# Patient Record
Sex: Male | Born: 1945
Health system: Southern US, Community
[De-identification: ages and names within clinical notes are randomized; demographics above are authoritative.]

## PROBLEM LIST (undated history)

## (undated) DIAGNOSIS — C180 Malignant neoplasm of cecum: Secondary | ICD-10-CM

## (undated) DIAGNOSIS — I10 Essential (primary) hypertension: Secondary | ICD-10-CM

## (undated) DIAGNOSIS — C19 Malignant neoplasm of rectosigmoid junction: Secondary | ICD-10-CM

## (undated) DIAGNOSIS — I213 ST elevation (STEMI) myocardial infarction of unspecified site: Secondary | ICD-10-CM

## (undated) DIAGNOSIS — K259 Gastric ulcer, unspecified as acute or chronic, without hemorrhage or perforation: Secondary | ICD-10-CM

## (undated) DIAGNOSIS — E8881 Metabolic syndrome: Secondary | ICD-10-CM

## (undated) DIAGNOSIS — E876 Hypokalemia: Secondary | ICD-10-CM

## (undated) DIAGNOSIS — E785 Hyperlipidemia, unspecified: Secondary | ICD-10-CM

## (undated) DIAGNOSIS — C169 Malignant neoplasm of stomach, unspecified: Secondary | ICD-10-CM

## (undated) DIAGNOSIS — Z800 Family history of malignant neoplasm of digestive organs: Secondary | ICD-10-CM

## (undated) DIAGNOSIS — R918 Other nonspecific abnormal finding of lung field: Secondary | ICD-10-CM

## (undated) DIAGNOSIS — I2510 Atherosclerotic heart disease of native coronary artery without angina pectoris: Secondary | ICD-10-CM

## (undated) DIAGNOSIS — K219 Gastro-esophageal reflux disease without esophagitis: Secondary | ICD-10-CM

## (undated) DIAGNOSIS — Z720 Tobacco use: Secondary | ICD-10-CM

## (undated) DIAGNOSIS — Z9289 Personal history of other medical treatment: Secondary | ICD-10-CM

## (undated) HISTORY — DX: Malignant neoplasm of rectosigmoid junction: C19

## (undated) HISTORY — DX: Family history of malignant neoplasm of digestive organs: Z80.0

## (undated) HISTORY — PX: APPENDECTOMY: SHX54

## (undated) HISTORY — PX: HERNIA REPAIR: SHX51

## (undated) HISTORY — DX: Malignant neoplasm of stomach, unspecified: C16.9

## (undated) HISTORY — PX: CATARACT EXTRACTION, BILATERAL: SHX1313

## (undated) HISTORY — DX: Malignant neoplasm of cecum: C18.0

---

## 2009-01-18 ENCOUNTER — Other Ambulatory Visit: Admission: RE | Admit: 2009-01-18 | Discharge: 2009-01-18 | Payer: Self-pay | Admitting: Internal Medicine

## 2013-01-19 ENCOUNTER — Encounter (HOSPITAL_COMMUNITY)
Admission: AD | Disposition: A | Discharge status: Home / Self Care | Payer: Self-pay | Source: Other Acute Inpatient Hospital | Attending: Cardiovascular Disease

## 2013-01-19 ENCOUNTER — Encounter (HOSPITAL_COMMUNITY): Payer: Self-pay | Admitting: Internal Medicine

## 2013-01-19 ENCOUNTER — Inpatient Hospital Stay (HOSPITAL_COMMUNITY)
Admission: AD | Admit: 2013-01-19 | Discharge: 2013-01-21 | DRG: 282 | Disposition: A | Discharge status: Home / Self Care | Payer: Medicare Other | Source: Other Acute Inpatient Hospital | Attending: Cardiovascular Disease | Admitting: Cardiovascular Disease

## 2013-01-19 DIAGNOSIS — I2510 Atherosclerotic heart disease of native coronary artery without angina pectoris: Secondary | ICD-10-CM

## 2013-01-19 DIAGNOSIS — I214 Non-ST elevation (NSTEMI) myocardial infarction: Secondary | ICD-10-CM

## 2013-01-19 DIAGNOSIS — I498 Other specified cardiac arrhythmias: Secondary | ICD-10-CM | POA: Diagnosis present

## 2013-01-19 DIAGNOSIS — Z23 Encounter for immunization: Secondary | ICD-10-CM

## 2013-01-19 DIAGNOSIS — E8881 Metabolic syndrome: Secondary | ICD-10-CM | POA: Diagnosis present

## 2013-01-19 DIAGNOSIS — E876 Hypokalemia: Secondary | ICD-10-CM | POA: Diagnosis present

## 2013-01-19 DIAGNOSIS — J449 Chronic obstructive pulmonary disease, unspecified: Secondary | ICD-10-CM | POA: Diagnosis present

## 2013-01-19 DIAGNOSIS — Z7982 Long term (current) use of aspirin: Secondary | ICD-10-CM

## 2013-01-19 DIAGNOSIS — I213 ST elevation (STEMI) myocardial infarction of unspecified site: Secondary | ICD-10-CM

## 2013-01-19 DIAGNOSIS — Z9089 Acquired absence of other organs: Secondary | ICD-10-CM

## 2013-01-19 DIAGNOSIS — Z7902 Long term (current) use of antithrombotics/antiplatelets: Secondary | ICD-10-CM

## 2013-01-19 DIAGNOSIS — Z720 Tobacco use: Secondary | ICD-10-CM

## 2013-01-19 DIAGNOSIS — K219 Gastro-esophageal reflux disease without esophagitis: Secondary | ICD-10-CM | POA: Diagnosis present

## 2013-01-19 DIAGNOSIS — E785 Hyperlipidemia, unspecified: Secondary | ICD-10-CM

## 2013-01-19 DIAGNOSIS — F17200 Nicotine dependence, unspecified, uncomplicated: Secondary | ICD-10-CM | POA: Diagnosis present

## 2013-01-19 DIAGNOSIS — Z79899 Other long term (current) drug therapy: Secondary | ICD-10-CM

## 2013-01-19 DIAGNOSIS — J4489 Other specified chronic obstructive pulmonary disease: Secondary | ICD-10-CM | POA: Diagnosis present

## 2013-01-19 DIAGNOSIS — R918 Other nonspecific abnormal finding of lung field: Secondary | ICD-10-CM

## 2013-01-19 HISTORY — DX: ST elevation (STEMI) myocardial infarction of unspecified site: I21.3

## 2013-01-19 HISTORY — DX: Other nonspecific abnormal finding of lung field: R91.8

## 2013-01-19 HISTORY — DX: Hypokalemia: E87.6

## 2013-01-19 HISTORY — DX: Hyperlipidemia, unspecified: E78.5

## 2013-01-19 HISTORY — DX: Atherosclerotic heart disease of native coronary artery without angina pectoris: I25.10

## 2013-01-19 HISTORY — DX: Tobacco use: Z72.0

## 2013-01-19 HISTORY — DX: Gastro-esophageal reflux disease without esophagitis: K21.9

## 2013-01-19 HISTORY — DX: Metabolic syndrome: E88.81

## 2013-01-19 HISTORY — PX: LEFT HEART CATH: SHX5478

## 2013-01-19 LAB — CREATININE, SERUM
Creatinine, Ser: 0.95 mg/dL (ref 0.50–1.35)
GFR calc Af Amer: >90 mL/min (ref >90)
GFR calc non Af Amer: 83 mL/min — ABNORMAL LOW (ref >90)

## 2013-01-19 LAB — LIPID PANEL
LDL Cholesterol: 132 mg/dL — ABNORMAL HIGH (ref 0–99)
Total CHOL/HDL Ratio: 5.7 RATIO
Triglycerides: 133 mg/dL (ref <150)
VLDL: 27 mg/dL (ref 0–40)

## 2013-01-19 LAB — COMPREHENSIVE METABOLIC PANEL
ALT: 9 U/L (ref 0–53)
AST: 28 U/L (ref 0–37)
Albumin: 3.1 g/dL — ABNORMAL LOW (ref 3.5–5.2)
Alkaline Phosphatase: 112 U/L (ref 39–117)
Calcium: 8.8 mg/dL (ref 8.4–10.5)
GFR calc Af Amer: >90 mL/min (ref >90)
Potassium: 3.8 mEq/L (ref 3.5–5.1)
Sodium: 140 mEq/L (ref 135–145)
Total Protein: 6.3 g/dL (ref 6.0–8.3)

## 2013-01-19 LAB — CBC
Hemoglobin: 13.8 g/dL (ref 13.0–17.0)
MCH: 32.3 pg (ref 26.0–34.0)
RBC: 4.27 MIL/uL (ref 4.22–5.81)
WBC: 10.7 10*3/uL — ABNORMAL HIGH (ref 4.0–10.5)

## 2013-01-19 LAB — TSH: TSH: 0.952 u[IU]/mL (ref 0.350–4.500)

## 2013-01-19 LAB — MRSA PCR SCREENING: MRSA by PCR: NEGATIVE

## 2013-01-19 LAB — TROPONIN I
Troponin I: 6.74 ng/mL (ref <0.30)
Troponin I: 3.15 ng/mL (ref <0.30)

## 2013-01-19 SURGERY — LEFT HEART CATH
Anesthesia: LOCAL

## 2013-01-19 MED ORDER — IPRATROPIUM BROMIDE 0.02 % IN SOLN: 0.5000 mg | RESPIRATORY_TRACT | Status: DC | PRN

## 2013-01-19 MED ORDER — ONDANSETRON HCL 4 MG/2ML IJ SOLN
4.0000 mg | Freq: Four times a day (QID) | INTRAMUSCULAR | Status: DC | PRN
  Filled 2013-01-19: qty 2

## 2013-01-19 MED ORDER — LACTATED RINGERS IV SOLN: INTRAVENOUS | Status: DC

## 2013-01-19 MED ORDER — ATORVASTATIN CALCIUM 80 MG PO TABS
80.0000 mg | ORAL_TABLET | Freq: Every day | ORAL | Status: DC
  Administered 2013-01-19: 80 mg via ORAL
  Filled 2013-01-19 (×3): qty 1

## 2013-01-19 MED ORDER — AMLODIPINE BESYLATE 2.5 MG PO TABS
2.5000 mg | ORAL_TABLET | Freq: Every day | ORAL | Status: DC
  Administered 2013-01-19 – 2013-01-21 (×3): 2.5 mg via ORAL
  Filled 2013-01-19 (×3): qty 1

## 2013-01-19 MED ORDER — IPRATROPIUM BROMIDE 0.02 % IN SOLN
0.5000 mg | Freq: Four times a day (QID) | RESPIRATORY_TRACT | Status: DC
  Administered 2013-01-19 – 2013-01-20 (×5): 0.5 mg via RESPIRATORY_TRACT
  Filled 2013-01-19 (×5): qty 2.5

## 2013-01-19 MED ORDER — NICOTINE 21 MG/24HR TD PT24
21.0000 mg | MEDICATED_PATCH | Freq: Every day | TRANSDERMAL | Status: DC
  Administered 2013-01-19 – 2013-01-21 (×3): 21 mg via TRANSDERMAL
  Filled 2013-01-19 (×3): qty 1

## 2013-01-19 MED ORDER — ONDANSETRON HCL 4 MG/2ML IJ SOLN: 4.0000 mg | Freq: Four times a day (QID) | INTRAMUSCULAR | Status: DC | PRN

## 2013-01-19 MED ORDER — INFLUENZA VAC SPLIT QUAD 0.5 ML IM SUSP: 0.5000 mL | INTRAMUSCULAR | Status: DC

## 2013-01-19 MED ORDER — INFLUENZA VAC SPLIT QUAD 0.5 ML IM SUSP: 0.5000 mL | INTRAMUSCULAR | Status: DC | PRN

## 2013-01-19 MED ORDER — ASPIRIN EC 81 MG PO TBEC: 81.0000 mg | DELAYED_RELEASE_TABLET | Freq: Every day | ORAL | Status: DC

## 2013-01-19 MED ORDER — LISINOPRIL 5 MG PO TABS
5.0000 mg | ORAL_TABLET | Freq: Every day | ORAL | Status: DC
  Administered 2013-01-19 – 2013-01-21 (×3): 5 mg via ORAL
  Filled 2013-01-19 (×3): qty 1

## 2013-01-19 MED ORDER — CLOPIDOGREL BISULFATE 300 MG PO TABS
600.0000 mg | ORAL_TABLET | Freq: Once | ORAL | Status: AC
  Administered 2013-01-19: 600 mg via ORAL
  Filled 2013-01-19: qty 2

## 2013-01-19 MED ORDER — ACETAMINOPHEN 325 MG PO TABS
650.0000 mg | ORAL_TABLET | ORAL | Status: DC | PRN
Start: 2013-01-19 — End: 2013-01-21
  Administered 2013-01-21: 650 mg via ORAL
  Filled 2013-01-19: qty 2

## 2013-01-19 MED ORDER — MIDAZOLAM HCL 2 MG/2ML IJ SOLN
INTRAMUSCULAR | Status: AC
  Filled 2013-01-19: qty 2

## 2013-01-19 MED ORDER — MORPHINE SULFATE 2 MG/ML IJ SOLN
INTRAMUSCULAR | Status: AC
  Filled 2013-01-19: qty 1

## 2013-01-19 MED ORDER — ATORVASTATIN CALCIUM 80 MG PO TABS: 80.0000 mg | ORAL_TABLET | Freq: Every day | ORAL | Status: DC

## 2013-01-19 MED ORDER — PNEUMOCOCCAL VAC POLYVALENT 25 MCG/0.5ML IJ INJ: 0.5000 mL | INJECTION | INTRAMUSCULAR | Status: DC

## 2013-01-19 MED ORDER — HEPARIN (PORCINE) IN NACL 2-0.9 UNIT/ML-% IJ SOLN
INTRAMUSCULAR | Status: AC
  Filled 2013-01-19: qty 1000

## 2013-01-19 MED ORDER — CLOPIDOGREL BISULFATE 75 MG PO TABS
75.0000 mg | ORAL_TABLET | Freq: Every day | ORAL | Status: DC
  Administered 2013-01-20 – 2013-01-21 (×2): 75 mg via ORAL
  Filled 2013-01-19 (×3): qty 1

## 2013-01-19 MED ORDER — ALBUTEROL SULFATE (5 MG/ML) 0.5% IN NEBU
2.5000 mg | INHALATION_SOLUTION | Freq: Four times a day (QID) | RESPIRATORY_TRACT | Status: DC
  Administered 2013-01-19 – 2013-01-20 (×5): 2.5 mg via RESPIRATORY_TRACT
  Filled 2013-01-19 (×5): qty 0.5

## 2013-01-19 MED ORDER — PNEUMOCOCCAL VAC POLYVALENT 25 MCG/0.5ML IJ INJ: 0.5000 mL | INJECTION | INTRAMUSCULAR | Status: DC | PRN

## 2013-01-19 MED ORDER — ACETAMINOPHEN 325 MG PO TABS: 650.0000 mg | ORAL_TABLET | ORAL | Status: DC | PRN

## 2013-01-19 MED ORDER — NITROGLYCERIN 0.2 MG/ML ON CALL CATH LAB
INTRAVENOUS | Status: AC
  Filled 2013-01-19: qty 1

## 2013-01-19 MED ORDER — ALBUTEROL SULFATE (5 MG/ML) 0.5% IN NEBU: 2.5000 mg | INHALATION_SOLUTION | RESPIRATORY_TRACT | Status: DC | PRN

## 2013-01-19 MED ORDER — ENOXAPARIN SODIUM 40 MG/0.4ML ~~LOC~~ SOLN
40.0000 mg | SUBCUTANEOUS | Status: DC
  Administered 2013-01-19 – 2013-01-20 (×2): 40 mg via SUBCUTANEOUS
  Filled 2013-01-19 (×3): qty 0.4

## 2013-01-19 MED ORDER — BIOTENE DRY MOUTH MT LIQD
15.0000 mL | Freq: Two times a day (BID) | OROMUCOSAL | Status: DC
  Administered 2013-01-19 – 2013-01-21 (×5): 15 mL via OROMUCOSAL

## 2013-01-19 MED ORDER — FENTANYL CITRATE 0.05 MG/ML IJ SOLN
INTRAMUSCULAR | Status: AC
  Filled 2013-01-19: qty 2

## 2013-01-19 MED ORDER — SODIUM CHLORIDE 0.9 % IV SOLN
INTRAVENOUS | Status: DC
  Administered 2013-01-19 (×2): via INTRAVENOUS

## 2013-01-19 MED ORDER — ASPIRIN EC 81 MG PO TBEC
81.0000 mg | DELAYED_RELEASE_TABLET | Freq: Every day | ORAL | Status: DC
  Administered 2013-01-19 – 2013-01-21 (×3): 81 mg via ORAL
  Filled 2013-01-19 (×3): qty 1

## 2013-01-19 MED ORDER — MORPHINE SULFATE 2 MG/ML IJ SOLN
2.0000 mg | INTRAMUSCULAR | Status: DC | PRN
  Administered 2013-01-19: 2 mg via INTRAVENOUS

## 2013-01-19 MED ORDER — PANTOPRAZOLE SODIUM 40 MG PO TBEC
40.0000 mg | DELAYED_RELEASE_TABLET | Freq: Every day | ORAL | Status: DC
  Administered 2013-01-19 – 2013-01-21 (×3): 40 mg via ORAL
  Filled 2013-01-19 (×3): qty 1

## 2013-01-19 MED ORDER — LIDOCAINE HCL (PF) 1 % IJ SOLN
INTRAMUSCULAR | Status: AC
  Filled 2013-01-19: qty 30

## 2013-01-19 MED ORDER — NITROGLYCERIN 0.4 MG SL SUBL: 0.4000 mg | SUBLINGUAL_TABLET | SUBLINGUAL | Status: DC | PRN

## 2013-01-19 MED ORDER — CLOPIDOGREL BISULFATE 75 MG PO TABS: 75.0000 mg | ORAL_TABLET | Freq: Every day | ORAL | Status: DC

## 2013-01-19 MED ORDER — ZOLPIDEM TARTRATE 5 MG PO TABS: 5.0000 mg | ORAL_TABLET | Freq: Every evening | ORAL | Status: DC | PRN

## 2013-01-19 NOTE — Progress Notes (Addendum)
Subjective: No complaints  Objective: Vital signs in last 24 hours: Temp:  [97.3 F (36.3 C)-97.4 F (36.3 C)] 97.3 F (36.3 C) (11/23 0754) Pulse Rate:  [48-65] 65 (11/23 0930) Resp:  [8-16] 16 (11/23 0930) BP: (119-182)/(59-98) 144/98 mmHg (11/23 0930) SpO2:  [99 %-100 %] 100 % (11/23 0930) Weight:  [160 lb 3.2 oz (72.666 kg)] 160 lb 3.2 oz (72.666 kg) (11/23 0600) Weight change:  Last BM Date: 01/17/13 Intake/Output from previous day: +125 11/22 0701 - 11/23 0700 In: 179.2 [I.V.:179.2] Out: -  Intake/Output this shift: Total I/O In: 125 [I.V.:125] Out: -   PE: General:Pleasant affect, NAD Skin:Warm and dry, brisk capillary refill HEENT:normocephalic, sclera clear, mucus membranes moist Neck:supple, no JVD, no bruits  Heart:S1S2 RRR without murmur, gallup, rub or click Lungs:clear without rales, rhonchi, or wheezes UEA:VWUJ, non tender, + BS, do not palpate liver spleen or masses Ext:no lower ext edema, 2+ pedal pulses, 2+ radial pulses Neuro:alert and oriented, MAE, follows commands, + facial symmetry   Lab Results:  Recent Labs  01/19/13 0820  WBC 10.7*  HGB 13.8  HCT 40.1  PLT 182   BMET  Recent Labs  01/19/13 0820  CREATININE 0.95    Recent Labs  01/19/13 0820  TROPONINI 6.44*    Lab Results  Component Value Date   CHOL 193 01/19/2013   HDL 34* 01/19/2013   LDLCALC 132* 01/19/2013   TRIG 133 01/19/2013   CHOLHDL 5.7 01/19/2013   No results found for this basename: HGBA1C     No results found for this basename: TSH     No results found for this basename: PROTIME,  in the last 72 hours     Studies/Results: No results found.  Medications: I have reviewed the patient's current medications. Scheduled Meds: . ipratropium  0.5 mg Nebulization Q6H   And  . albuterol  2.5 mg Nebulization Q6H  . antiseptic oral rinse  15 mL Mouth Rinse BID  . aspirin EC  81 mg Oral Daily  . atorvastatin  80 mg Oral q1800  . [START ON  01/20/2013] clopidogrel  75 mg Oral Q breakfast  . enoxaparin (LOVENOX) injection  40 mg Subcutaneous Q24H  . [START ON 01/20/2013] influenza vac split quadrivalent PF  0.5 mL Intramuscular Tomorrow-1000  . nicotine  21 mg Transdermal Daily  . pantoprazole  40 mg Oral Q0600  . [START ON 01/20/2013] pneumococcal 23 valent vaccine  0.5 mL Intramuscular Tomorrow-1000   Continuous Infusions: . sodium chloride 125 mL/hr at 01/19/13 0800   PRN Meds:.acetaminophen, albuterol, ipratropium, nitroGLYCERIN, ondansetron (ZOFRAN) IV, zolpidem  Assessment/Plan: Principal Problem:   Non-STEMI (non-ST elevated myocardial infarction), involving RCA Active Problems:   CAD (coronary artery disease) RCA non dominant vessel, LAD 40%, 80-90% 2nd diag EF 55%    Hyperlipidemia LDL goal < 70   Pulmonary nodules   PLAN: now pain free, on Lipitor now for hyperlipidemia, add lisinopril 5 and norvasc. No BB due to bradycardia.  Will plan for pul consult tomorrow for lung nodules.  LOS: 0 days   Time spent with pt. :15 minutes. [facility] R  Nurse Practitioner Certified Pager [phone] 01/19/2013, 10:40 AM   Agree with note written by Nada Boozer RNP  Admitted with NSTEMI. Trop 6. Emergent cath early this AM by Dr. Wonda Cheng showed occluded ND RCA with otherwise non critical CAD and preserved LVEF. Currently painfree. Exam benign. Groin OK. OOB to chair. Bradycardic so won't start BB. Will  start ACE-I and CCB for HTN. Statin. Smoking cessation. Tx to tele tomorrow, prob home Tuesday. He has pulm nodules on CXR. Worry about Lung CA in a smoker. I have not told pt this yet. Will get Pulm consult. Will prob require a Chest CT.   Runell Gess 01/19/2013 10:42 AM

## 2013-01-19 NOTE — Plan of Care (Signed)
Problem: Phase I Progression Outcomes Goal: MD aware of Cardiac Marker results Outcome: Completed/Met Date Met:  01/19/13 Dr Allyson Sabal called critical troponin this AM

## 2013-01-19 NOTE — Progress Notes (Signed)
Dr Allyson Sabal updated at bedside. Updated sbp 120-150, pt SB with HR 45-60. MD assessed EKG from outside facility. Assessed Right groin site, will increase mobility once bedrest up post cath. Updated lab results thus far this shift. Troponin ordered q6hour. MD updated pt and visitor at bedside. Will continue to monitor. Koren Bound

## 2013-01-19 NOTE — Plan of Care (Signed)
Problem: Consults Goal: Diabetes Guidelines if Diabetic/Glucose > 140 If diabetic or lab glucose is > 140 mg/dl - Initiate Diabetes/Hyperglycemia Guidelines & Document Interventions  Outcome: Completed/Met Date Met:  01/19/13 Glucose on lab 128

## 2013-01-19 NOTE — Progress Notes (Signed)
Chaplain requested to meet pt's family in cath lab waiting area. Chaplain met with pt's girlfriend of 5 years, who said she took pt to ED in [place], [place] shortly after dinner when he was having chest pains. She said she had been awake all night. Pt has one stepson who was not present. She described pt as "hard working, always busy, doesn't like to sit down and rest." Chaplain acted as Print production planner between family member and cath lab medical team, escorted family member to 2S waiting area, and provided reflective listening, caring presence, and emotional support.   Guy Sandifer [place], [place] [phone]: General Chaplain Pager [phone]: Personal Pager

## 2013-01-19 NOTE — H&P (Addendum)
Chief Complaint: Chest pain  HPI: This is 67 year old Caucasian male who was transferred from [facility] emergency department with concerns for STEMI. According to the patient he started having chest pain on the left side of his chest as if he pulled a muscle about 6 PM, this pain was coming and going and patient continued to have mild chest pain on presentation to our Cath Lab which she reported to be 1/10. Patient for the chest pain was associated with shortness of breath nausea.  Patient denied recent symptoms of PND orthopnea, lower extremity edema, frequent or prolonged palpitations.  Pt arrived directly to the cath lab and underwent LHC, coronary angiography, LV gram as a NSTEMI  Review of Systems: 12 systems were reviewed and were negative except mentioned in history of present illness   Past Medical History: Past Medical History  Diagnosis Date  . GERD (gastroesophageal reflux disease)    Past Surgical History  Procedure Laterality Date  . Appendectomy      Medications: Ranitidine 150 mg  twice a day  Allergies:  Allergies no known allergies  Social History: Active smoker, MOD and one pack per day for many years Denied drug use  Family History: Denied any family history of cardiac or disease  PHYSICAL EXAM: Vital Signs (in the cath lab): HR 65 bpm, BP 115/72 mm Hg; SpO2=96% on 2 L [place]  General:  NAD, alert and oriented x 3 HEENT: normal, tobacco stain on the beard Neck: supple. no JVD. Carotids 2+ bilat; No lymphadenopathy or thryomegaly appreciated. Cor: PMI nondisplaced. Regular rate & rhythm. No rubs, gallops or murmurs, 2+ bilateral femoral and TP/DP pulses Lungs: bilateral wheezings Abdomen: soft, nontender, nondistended. No hepatosplenomegaly. No bruits or masses. Good bowel sounds. Extremities: no cyanosis, clubbing, rash, edema Neuro: alert & oriented x 3, cranial nerves grossly intact. moves all 4 extremities w/o difficulty. Affect pleasant.  OSH Labs:   Troponin - 1.65; CK-MB-15.6 Chemistry review was notable for Cr of 1.08, potassium of 3.2 INR -1.0 WBC-10.9 Hb-14.4 PLT -198  EKG personally reviewed and interpreted by me: Sinus Bradycardia 57 bpm, normal axis, Q waves in I, aVL; 2 mm TWI in V3-V6, I, aVL   Assessment/Plan CAD with non-ST elevation MI Patient is multivessel CAD with his left heart catheterization including: occluded right coronary artery with left to right collaterals, 50-60% proximal LAD, 50% obtuse marginal branches. His overall LVEF looked normal with basal and mid anterolateral hypokinesis. - Aspirin 81 mg, Plavix 75 mg daily, atorvastatin 80 mg daily - We'll plan on starting beta blockers once wheezings resolve  Tobacco abuse / COPD (clinical diagnosis) Nicotine patch Duo-nebs q6 hours and as needed  Hypokalemia Will supplement potassium by mouth  Taylinn Brabant 01/19/2013, 5:13 AM

## 2013-01-19 NOTE — CV Procedure (Signed)
Steven Drake is a 67 y.o. male    [id number]  [id number] LOCATION:  FACILITY: [facility]  PHYSICIAN: Lennette Bihari, MD, [facility] [birth year]   DATE OF PROCEDURE:  01/19/2013      EMERGENT CARDIAC CATHETERIZATION     HISTORY:  Mr. Dontavian Marchi is a 67 year old Caucasian male who was transferred acutely from [facility] with possible ST segment elevation myocardial infarction. The patient had noticed episodes of chest pain since approximately 6 PM. His initial troponin was negative but subsequent troponin was positive. He was noted to have anterolateral ST changes. He was transported acutely to [facility] for emergent cardiac catheterization. Upon arrival, review of ECG suggests more acute coronary syndrome rather than ST segment elevation myocardial infarction with T-wave inversion I and avLL, V4 through V6.   PROCEDURE:  The patient arrived to [facility] cardiac catheterization laboratory with 1/10 residual chest discomfort. His right femoral artery is punctured anteriorly and a 6 French sheath was inserted without difficulty. Versed 2 mg plus fentanyl 50 mcg were administered. Cardiac catheterization was performed utilizing 6 French diagnostic F. L4 catheter 6 French right guide catheter. A 6 French pigtail catheter was used for left ventriculography. Angiograms were reviewed. It was felt that the patient did not require acute percutaneous coronary intervention a rather initial medical therapy is recommended. ACT was documented and the sheath was pulled in the catheterization suite prior to transfer to the unit.  HEMODYNAMICS:   Central Aorta: 110/50   Left Ventricle: 110/12  ANGIOGRAPHY:  1. Left main: Angiographically normal vessel which bifurcated into a large LAD and dominant left circumflex coronary artery.  2. LAD: A smooth 40% proximal narrowing. The first and second diagonal branches were diminutive in size. The second diagonal branch had diffuse 80-90% stenosis in its  midsegment but again was a very small vessel. It gave rise to 2 additional diagonal vessels and several septal perforating arteries. The vessel extended and wrapped around the apex. There was collateralization of the distal right coronary artery from the left injection. 3. Left circumflex: Dominant vessel which gave rise to 2 marginal branches and ended in the PDA and PLA-like vessel. There was 70% ostial narrowing in the first marginal branch and diffuse 50% narrowing in the second marginal branch. 4. Right coronary artery: Small nondominant vessel with 95% proximal stenosis. The mid RCA was 99 100% occluded. There was bridging collaterals supplying the RCA distally and they were more extensive left to right collaterals supplying the distal right coronary artery.  5. Biplane cine left ventriculography revealed an ejection fraction of approximately 50-55%.. On the RAO projection, there was focal contractility of the mid anterolateral wall. In the LAO projection there is very minimal inferolateral hypocontractility.  IMPRESSION:  Low normal LV function with moderately severe focal hypocontractility of the mid anterolateral wall and minimal hypocontractility of the focal nferolateral wall with an ejection fraction of approximately 50-55%.  Multi-vessel coronary obstructive disease with 40% smooth ostial narrowing of the LAD, 80-90% stenosis in the diminutive second diagonal branch of the LAD; dominant left circumflex carotid artery with 70% ostial narrowing in the first marginal branch and diffuse 50% narrowing in the second marginal branch; and nondominant right carotid artery with 95% proximal stenosis, subtotal/total mid RCA occlusion with moderate right to right bridging collaterals and extensive left to right collaterals.  RECOMMENDATION:  Initial medical therapy will be recommended. The RCA has both antegrade as well as retrograde collaterals and is a nondominant vessel. Smoking cessation is  imperative.  Lennette Bihari, MD, [facility] 01/19/2013 6:01 AM

## 2013-01-19 NOTE — Progress Notes (Addendum)
Dr Allyson Sabal updated pt right femoral site WNL, pt OOB to chair and ambulated in hall ~389ft. Pt with good mobility, when back to room, pt reports pain similar to chest pain from 11/22, pt rates it at a 3/10, in comparison to pain on 11/22 was a 10/10. MD updated HR 60-70s, BP 120-150s. MD updated pt had ordered EKG this AM, results showed SB with ST/T wave abnormality with prolonged QT. Orders received for PRN morphine. Will continue to monitor. Koren Bound

## 2013-01-19 NOTE — Progress Notes (Signed)
Dr Allyson Sabal updated on critical troponin level, 6.44, collected at 0820. MD updated last result found from outside facility [facility]) ~0200 of troponin 1.65. Will continue to monitor. Koren Bound

## 2013-01-20 ENCOUNTER — Encounter (HOSPITAL_COMMUNITY): Payer: Self-pay | Admitting: Cardiology

## 2013-01-20 DIAGNOSIS — I219 Acute myocardial infarction, unspecified: Secondary | ICD-10-CM

## 2013-01-20 DIAGNOSIS — E876 Hypokalemia: Secondary | ICD-10-CM

## 2013-01-20 DIAGNOSIS — R918 Other nonspecific abnormal finding of lung field: Secondary | ICD-10-CM

## 2013-01-20 DIAGNOSIS — E8881 Metabolic syndrome: Secondary | ICD-10-CM

## 2013-01-20 DIAGNOSIS — Z720 Tobacco use: Secondary | ICD-10-CM

## 2013-01-20 HISTORY — DX: Hypokalemia: E87.6

## 2013-01-20 HISTORY — DX: Metabolic syndrome: E88.810

## 2013-01-20 HISTORY — DX: Tobacco use: Z72.0

## 2013-01-20 HISTORY — DX: Metabolic syndrome: E88.81

## 2013-01-20 LAB — CBC
HCT: 36.5 % — ABNORMAL LOW (ref 39.0–52.0)
Hemoglobin: 12.5 g/dL — ABNORMAL LOW (ref 13.0–17.0)
MCHC: 34.2 g/dL (ref 30.0–36.0)
MCV: 93.4 fL (ref 78.0–100.0)
Platelets: 169 10*3/uL (ref 150–400)
RDW: 13.4 % (ref 11.5–15.5)

## 2013-01-20 LAB — BASIC METABOLIC PANEL
BUN: 12 mg/dL (ref 6–23)
Creatinine, Ser: 0.88 mg/dL (ref 0.50–1.35)
GFR calc Af Amer: >90 mL/min (ref >90)
GFR calc non Af Amer: 86 mL/min — ABNORMAL LOW (ref >90)
Glucose, Bld: 117 mg/dL — ABNORMAL HIGH (ref 70–99)
Potassium: 3.3 mEq/L — ABNORMAL LOW (ref 3.5–5.1)

## 2013-01-20 LAB — TROPONIN I
Troponin I: 3.33 ng/mL (ref <0.30)
Troponin I: 2.78 ng/mL (ref <0.30)

## 2013-01-20 LAB — GLUCOSE, CAPILLARY: Glucose-Capillary: 97 mg/dL (ref 70–99)

## 2013-01-20 MED ORDER — IPRATROPIUM BROMIDE 0.02 % IN SOLN: 0.5000 mg | Freq: Four times a day (QID) | RESPIRATORY_TRACT | Status: DC | PRN

## 2013-01-20 MED ORDER — ALBUTEROL SULFATE (5 MG/ML) 0.5% IN NEBU: 2.5000 mg | INHALATION_SOLUTION | Freq: Four times a day (QID) | RESPIRATORY_TRACT | Status: DC | PRN

## 2013-01-20 MED ORDER — POTASSIUM CHLORIDE CRYS ER 20 MEQ PO TBCR
40.0000 meq | EXTENDED_RELEASE_TABLET | Freq: Once | ORAL | Status: AC
  Administered 2013-01-20: 40 meq via ORAL
  Filled 2013-01-20: qty 2

## 2013-01-20 MED ORDER — ALPRAZOLAM 0.25 MG PO TABS: 0.2500 mg | ORAL_TABLET | Freq: Three times a day (TID) | ORAL | Status: DC | PRN

## 2013-01-20 NOTE — Progress Notes (Signed)
Utilization Review Completed.  

## 2013-01-20 NOTE — Progress Notes (Signed)
Subjective: Up in chair, + chest pain with walking in hall last pm but not today  Objective: Vital signs in last 24 hours: Temp:  [97.5 F (36.4 C)-98.5 F (36.9 C)] 98.5 F (36.9 C) (11/24 0752) Pulse Rate:  [50-68] 68 (11/24 1000) Resp:  [10-21] 11 (11/24 1000) BP: (96-165)/(50-78) 96/51 mmHg (11/24 1000) SpO2:  [94 %-100 %] 96 % (11/24 1000) Weight:  [165 lb 5.5 oz (75 kg)] 165 lb 5.5 oz (75 kg) (11/24 0500) Weight change: 5 lb 2.3 oz (2.334 kg) Last BM Date: 01/17/13 Intake/Output from previous day:  +1624 11/23 0701 - 11/24 0700 In: 3070 [P.O.:1120; I.V.:1950] Out: 1500 [Urine:1500] Intake/Output this shift: Total I/O In: 0  Out: 325 [Urine:325]  PE: General:Pleasant affect, NAD Skin:Warm and dry, brisk capillary refill HEENT:normocephalic, sclera clear, mucus membranes moist Heart:S1S2 RRR without murmur, gallup, rub or click Lungs:clear without rales, rhonchi, or wheezes QIO:NGEX, non tender, + BS, do not palpate liver spleen or masses Ext:no lower ext edema, 2+ pedal pulses, 2+ radial pulses Neuro:alert and oriented, MAE, follows commands, + facial symmetry   EKG:  Sr at 62 with evolving ant lat MI changes.   Lab Results:  Recent Labs  01/19/13 0820 01/20/13 0140  WBC 10.7* 8.9  HGB 13.8 12.5*  HCT 40.1 36.5*  PLT 182 169   BMET  Recent Labs  01/19/13 1300 01/20/13 0140  NA 140 135  K 3.8 3.3*  CL 104 101  CO2 28 26  GLUCOSE 128* 117*  BUN 14 12  CREATININE 0.91 0.88  CALCIUM 8.8 8.5    Recent Labs  01/20/13 0140 01/20/13 0825  TROPONINI 3.33* 2.78*    Lab Results  Component Value Date   CHOL 193 01/19/2013   HDL 34* 01/19/2013   LDLCALC 132* 01/19/2013   TRIG 133 01/19/2013   CHOLHDL 5.7 01/19/2013   Lab Results  Component Value Date   HGBA1C 6.2* 01/19/2013     Lab Results  Component Value Date   TSH 0.952 01/19/2013    Hepatic Function Panel  Recent Labs  01/19/13 1300  PROT 6.3  ALBUMIN 3.1*  AST  28  ALT 9  ALKPHOS 112  BILITOT 0.2*    Recent Labs  01/19/13 0820  CHOL 193   No results found for this basename: PROTIME,  in the last 72 hours      Studies/Results: No results found.  Medications: I have reviewed the patient's current medications. Scheduled Meds: . amLODipine  2.5 mg Oral Daily  . antiseptic oral rinse  15 mL Mouth Rinse BID  . aspirin EC  81 mg Oral Daily  . atorvastatin  80 mg Oral q1800  . clopidogrel  75 mg Oral Q breakfast  . enoxaparin (LOVENOX) injection  40 mg Subcutaneous Q24H  . lisinopril  5 mg Oral Daily  . nicotine  21 mg Transdermal Daily  . pantoprazole  40 mg Oral Q0600   Continuous Infusions: . sodium chloride Stopped (01/20/13 0500)   PRN Meds:.acetaminophen, albuterol, ALPRAZolam, influenza vac split quadrivalent PF, ipratropium, morphine injection, nitroGLYCERIN, ondansetron (ZOFRAN) IV, pneumococcal 23 valent vaccine, zolpidem  Assessment/Plan: Principal Problem:   Non-STEMI (non-ST elevated myocardial infarction), involving RCA Active Problems:   CAD (coronary artery disease) RCA non dominant vessel, LAD 40%, 80-90% 2nd diag EF 55%    Hyperlipidemia LDL goal < 70   Pulmonary nodules   Hypokalemia   Tobacco abuse   Metabolic syndrome, with mildly elevated HgBA1C  PLAN: would transfer to tele, but with chest pain last pm day of MI, today being Day 1 may need stepdown for 24 hours.  He had no intervention.  Will check echo also- EF at cath was 50-55%.  Pk troponin 6.74 is decreasing.   HGB A1C elevated, will change diet and check accu checks.  K+ replaced.  Not as much bradycardia, but BP soft at times with lisinopril and amlodipine. Have contacted Pul. To eval pul nodules for plan.  CT chest was done 01/18/13 in [facility].  LOS: 1 day   Time spent with pt. : 15 minutes. [facility] R  Nurse Practitioner Certified Pager [phone] 01/20/2013, 10:55 AM   I have seen and examined the patient along with Nada Boozer, NP.  I have reviewed the chart, notes and new data.  I agree with NP's note.  Key new complaints: no angina at all today Key examination changes: no arrhythmia or signs of CHF Key new findings / data: reviewed echo while being performed at bedside. EF is normal overall. Small apicolateral hypokinetic area.  PLAN: Transfer telemetry. Smoking cessation discussed in detail. Statin. ASA indefinitely + clopidogrel 12 months. ACE inh would likely be beneficial, but his BP may not tolerate it. Possible DC home tomorrow.  Thurmon Fair, MD, [facility] [phone] 01/20/2013, 3:31 PM

## 2013-01-20 NOTE — Consult Note (Signed)
PULMONARY  / CRITICAL CARE MEDICINE  Name: Steven Drake MRN: [id number] DOB: 1945/11/09    ADMISSION DATE:  01/19/2013 CONSULTATION DATE:  01/20/13  REFERRING MD :  Allyson Sabal  PRIMARY SERVICE:  Cardiology   CHIEF COMPLAINT:  Pulm nodules   BRIEF PATIENT DESCRIPTION: 67 yo male with no known PMH, extensive smoking hx, limited medical care presented 11/23 to [facility] hospital with chest pain.  Tx to [facility] as ?STEMI.  To cath lab for ACS.  No PCI indicated.  Bilat pulm nodules noted on CT chest as [place] as incidental finding and PCCM consulted.   SIGNIFICANT EVENTS / STUDIES:  10/22 CT chest ([place]) >>> bilat pulm nodules = 7.67mm nodule L apex, 6.24mm nodule RML, emphysema   LINES / TUBES: none  CULTURES: none  ANTIBIOTICS: none  HISTORY OF PRESENT ILLNESS: 66 yo male with extensive smoking hx, limited medical care presented to [facility] hospital with chest pain.  Tx to [facility] as ?STEMI.  To cath lab for ACS.  No PCI indicated.  Bilat pulm nodules noted on CT chest as [place] as incidental finding.  Denies current chest pain.  75 pack year smoking hx. Denies any cough, SOB at baseline.  Has had ~10 lbs weight loss over last few months.    PAST MEDICAL HISTORY :  Past Medical History  Diagnosis Date  . GERD (gastroesophageal reflux disease)   . STEMI (ST elevation myocardial infarction), 01/19/13 01/19/2013  . CAD (coronary artery disease) RCA non dominant vessel, LAD 40%, 80-90% 2nd diag EF 55%  01/19/2013  . Hyperlipidemia LDL goal < 70 01/19/2013  . Pulmonary nodules 01/19/2013  . Hypokalemia 01/20/2013  . Tobacco abuse 01/20/2013  . Metabolic syndrome, with mildly elevated HgBA1C 01/20/2013   Past Surgical History  Procedure Laterality Date  . Appendectomy     Prior to Admission medications   Medication Sig Start Date End Date Taking? Authorizing Provider  ibuprofen (ADVIL,MOTRIN) 200 MG tablet Take 200 mg by mouth every 6 (six) hours as needed.   Yes Historical Provider,  MD  ranitidine (ZANTAC) 150 MG tablet Take 150 mg by mouth daily.   Yes Historical Provider, MD   No Known Allergies  FAMILY HISTORY:  History reviewed. No pertinent family history. SOCIAL HISTORY:  reports that he has been smoking Cigarettes.  He started smoking about 50 years ago. He has a 112.5 pack-year smoking history. He does not have any smokeless tobacco history on file. His alcohol and drug histories are not on file.  REVIEW OF SYSTEMS:   As per HPI - all other systems reviewed and were neg.   Constitutional: negative for anorexia, fevers and sweats  Eyes: negative for irritation, redness and visual disturbance  Ears, nose, mouth, throat, and face: negative for earaches, epistaxis, nasal congestion and sore throat  Respiratory: negative for cough, dyspnea on exertion, sputum and wheezing  Cardiovascular: negative for chest pain, dyspnea, lower extremity edema, orthopnea, palpitations and syncope  Gastrointestinal: negative for abdominal pain, constipation, diarrhea, melena, nausea and vomiting  Genitourinary:negative for dysuria, frequency and hematuria  Hematologic/lymphatic: negative for bleeding, easy bruising and lymphadenopathy  Musculoskeletal:negative for arthralgias, muscle weakness and stiff joints  Neurological: negative for coordination problems, gait problems, headaches and weakness  Endocrine: negative for diabetic symptoms including polydipsia, polyuria and weight loss    VITAL SIGNS: Temp:  [97.5 F (36.4 C)-98.5 F (36.9 C)] 98.5 F (36.9 C) (11/24 0752) Pulse Rate:  [55-68] 68 (11/24 1000) Resp:  [10-21] 11 (11/24 1000) BP: (96-165)/(50-72) 96/51  mmHg (11/24 1000) SpO2:  [94 %-100 %] 96 % (11/24 1000) Weight:  [165 lb 5.5 oz (75 kg)] 165 lb 5.5 oz (75 kg) (11/24 0500)  PHYSICAL EXAMINATION: General:   Pleasant male, NAD OOB in chair, appears older than stated age  Neuro:  Awake, alert, appropriate, MAE HEENT:  Mm moist, no JVD Cardiovascular:  s1s2  rrr Lungs:  resps even non labored on RA, cta  Abdomen:  Soft, +bs Musculoskeletal:  Warm and dry, no edema    Recent Labs Lab 01/19/13 0820 01/19/13 1300 01/20/13 0140  NA  --  140 135  K  --  3.8 3.3*  CL  --  104 101  CO2  --  28 26  BUN  --  14 12  CREATININE 0.95 0.91 0.88  GLUCOSE  --  128* 117*    Recent Labs Lab 01/19/13 0820 01/20/13 0140  HGB 13.8 12.5*  HCT 40.1 36.5*  WBC 10.7* 8.9  PLT 182 169   No results found.  ASSESSMENT / PLAN:  ACS PLAN -  Per cardiology   Bilateral pulmonary nodules - sub cm in LUL & RML extensive smoking hx. Concern for malignancy.   PLAN -  -Will need FU CT scan  Discussed with him extensively  -explained risk of malignancy & need to maintain FU -outpt pulm f/u in after CT scan -he will call us & we will arrange at [facility] -Smoking cessation strongly advised    WHITEHEART,KATHRYN, NP 01/20/2013  11:34 AM Pager: [phone] or [phone]  *Care during the described time interval was provided by me and/or other providers on the critical care team. I have reviewed this patient's available data, including medical history, events of note, physical examination and test results as part of my evaluation. Independently examined pt, evaluated data & formulated above care plan with NP who scribed this note & edited by me. PCCM to sign off  ALVA,RAKESH V.

## 2013-01-20 NOTE — Progress Notes (Signed)
  Echocardiogram 2D Echocardiogram has been performed.  Steven Drake 01/20/2013, 3:35 PM

## 2013-01-20 NOTE — Progress Notes (Signed)
CARDIAC REHAB PHASE I   PRE:  Rate/Rhythm: 79SR  BP:  Supine: 133/73  Sitting:   Standing:    SaO2: 985RA  MODE:  Ambulation: 600 ft   POST:  Rate/Rhythm: 93SR  BP:  Supine:   Sitting: 154/76  Standing:    SaO2: 97%RA 1355-1435 Pt walked 600 ft with steady gait. No CP. Has chest soreness that stays but did not worsen with activity. Began ed. Reviewed smoking cessation and gave handouts. Pt quit for 15 days once. Encouraged him to call [phone] for counseling as needed. Reviewed NTG use and MI restrictions. Left diet ed. Will follow up tomorrow.   Luetta Nutting, RN BSN  01/20/2013 2:30 PM

## 2013-01-20 NOTE — Progress Notes (Signed)
Pt transferred to 2W06 via wheelchair, transferred on portable tele, room air. Family notified of transfe/present for transfer. Pt belongings sent with pt. Tolerated transfer. Receiving RN present on arrival, pt placed on receiving units tele. Will continue to monitor. Koren Bound

## 2013-01-21 ENCOUNTER — Telehealth: Payer: Self-pay | Admitting: *Deleted

## 2013-01-21 ENCOUNTER — Encounter: Payer: Self-pay | Admitting: Cardiovascular Disease

## 2013-01-21 ENCOUNTER — Other Ambulatory Visit: Payer: Self-pay | Admitting: Pulmonary Disease

## 2013-01-21 DIAGNOSIS — F17200 Nicotine dependence, unspecified, uncomplicated: Secondary | ICD-10-CM

## 2013-01-21 DIAGNOSIS — E8881 Metabolic syndrome: Secondary | ICD-10-CM

## 2013-01-21 DIAGNOSIS — R918 Other nonspecific abnormal finding of lung field: Secondary | ICD-10-CM

## 2013-01-21 LAB — BASIC METABOLIC PANEL
CO2: 25 mEq/L (ref 19–32)
Calcium: 9.2 mg/dL (ref 8.4–10.5)
Chloride: 102 mEq/L (ref 96–112)
Creatinine, Ser: 1.05 mg/dL (ref 0.50–1.35)
GFR calc Af Amer: 82 mL/min — ABNORMAL LOW (ref >90)
Glucose, Bld: 92 mg/dL (ref 70–99)
Potassium: 3.8 mEq/L (ref 3.5–5.1)

## 2013-01-21 LAB — CBC
HCT: 37.9 % — ABNORMAL LOW (ref 39.0–52.0)
Hemoglobin: 12.8 g/dL — ABNORMAL LOW (ref 13.0–17.0)
MCH: 31.9 pg (ref 26.0–34.0)
MCV: 94.5 fL (ref 78.0–100.0)
Platelets: 165 10*3/uL (ref 150–400)
WBC: 8.6 10*3/uL (ref 4.0–10.5)

## 2013-01-21 MED ORDER — LISINOPRIL 5 MG PO TABS: 5.0000 mg | ORAL_TABLET | Freq: Every day | ORAL | Status: DC

## 2013-01-21 MED ORDER — ASPIRIN 81 MG PO TBEC: 81.0000 mg | DELAYED_RELEASE_TABLET | Freq: Every day | ORAL | Status: DC

## 2013-01-21 MED ORDER — CLOPIDOGREL BISULFATE 75 MG PO TABS: 75.0000 mg | ORAL_TABLET | Freq: Every day | ORAL | Status: DC

## 2013-01-21 MED ORDER — AMLODIPINE BESYLATE 2.5 MG PO TABS: 2.5000 mg | ORAL_TABLET | Freq: Every day | ORAL | Status: DC

## 2013-01-21 MED ORDER — PANTOPRAZOLE SODIUM 40 MG PO TBEC: 40.0000 mg | DELAYED_RELEASE_TABLET | Freq: Every day | ORAL | Status: DC

## 2013-01-21 MED ORDER — ATORVASTATIN CALCIUM 80 MG PO TABS: 80.0000 mg | ORAL_TABLET | Freq: Every day | ORAL | Status: DC

## 2013-01-21 MED ORDER — ACETAMINOPHEN 325 MG PO TABS: 650.0000 mg | ORAL_TABLET | ORAL | Status: DC | PRN

## 2013-01-21 MED ORDER — NITROGLYCERIN 0.4 MG SL SUBL: 0.4000 mg | SUBLINGUAL_TABLET | SUBLINGUAL | Status: DC | PRN

## 2013-01-21 MED ORDER — NICOTINE 21 MG/24HR TD PT24: 21.0000 mg | MEDICATED_PATCH | Freq: Every day | TRANSDERMAL | Status: DC

## 2013-01-21 NOTE — Telephone Encounter (Signed)
Order and recall placed in EPIC

## 2013-01-21 NOTE — Care Management Note (Signed)
    Page 1 of 1   01/21/2013     3:09:06 PM   CARE MANAGEMENT NOTE 01/21/2013  Patient:  Steven Drake, Steven Drake   Account Number:  [id number]  Date Initiated:  01/21/2013  Documentation initiated by:  Lichelle Viets  Subjective/Objective Assessment:   PT ADM WITH NSTEMI ON 01/19/13.  PTA, PT INDEPENDENT, LIVES WITH S.O.     Action/Plan:   MET WITH PT AND S.O. TO DISCUSS HOME NEEDS.  PT DENIES ANY NEEDS FOR HOME.  GIRLFRIEND TO ASSIST AT DC.   Anticipated DC Date:  01/21/2013   Anticipated DC Plan:  HOME/SELF CARE      DC Planning Services  CM consult      Choice offered to / List presented to:             Status of service:  Completed, signed off Medicare Important Message given?   (If response is "NO", the following Medicare IM given date fields will be blank) Date Medicare IM given:   Date Additional Medicare IM given:    Discharge Disposition:  HOME/SELF CARE  Per UR Regulation:  Reviewed for med. necessity/level of care/duration of stay  If discussed at Long Length of Stay Meetings, dates discussed:    Comments:

## 2013-01-21 NOTE — Progress Notes (Signed)
CARDIAC REHAB PHASE I   PRE:  Rate/Rhythm: 63SR  BP:  Supine:   Sitting: 132/60  Standing:    SaO2:   MODE:  Ambulation: 850 ft   POST:  Rate/Rhythm: 71  BP:  Supine:   Sitting: 144/60  Standing:    SaO2: 97%RA 1100-1135 Pt walked 850 ft with steady gait. No CP. Tolerated well. Education on diet and ex completed. Declined CRP 2. Will discuss with MD if he changes his mind. Strongly encouraged no smoking.    Luetta Nutting, RN BSN  01/21/2013 11:32 AM

## 2013-01-21 NOTE — Progress Notes (Addendum)
Subjective: No CP or SOB -- walked all of the way around the unit.  Objective: Vital signs in last 24 hours: Temp:  [98.1 F (36.7 C)-98.6 F (37 C)] 98.1 F (36.7 C) (11/25 0548) Pulse Rate:  [58-77] 64 (11/25 0548) Resp:  [13-18] 18 (11/25 0548) BP: (108-145)/(54-73) 132/73 mmHg (11/25 0548) SpO2:  [96 %-99 %] 96 % (11/25 0548) Weight:  [164 lb 3.9 oz (74.5 kg)] 164 lb 3.9 oz (74.5 kg) (11/25 0448) Weight change: -1 lb 1.6 oz (-0.5 kg) Last BM Date: 01/20/13 Intake/Output from previous day: -525 11/24 0701 - 11/25 0700 In: 600 [P.O.:600] Out: 1125 [Urine:1125] Intake/Output this shift: Total I/O In: 240 [P.O.:240] Out: -   PE: per MD General:Pleasant affect, NAD Skin:Warm and dry, brisk capillary refill HEENT:normocephalic, sclera clear, mucus membranes moist Neck:supple, no JVD, no bruits  Heart:S1S2 RRR without murmur, gallup, rub or click Lungs:clear without rales, rhonchi, or wheezes ZOX:WRUE, non tender, + BS, do not palpate liver spleen or masses Ext:no lower ext edema, 2+ pedal pulses, 2+ radial pulses Neuro:alert and oriented, MAE, follows commands, + facial symmetry   Lab Results:  Recent Labs  01/20/13 0140 01/21/13 0510  WBC 8.9 8.6  HGB 12.5* 12.8*  HCT 36.5* 37.9*  PLT 169 165   BMET  Recent Labs  01/20/13 0140 01/21/13 0510  NA 135 139  K 3.3* 3.8  CL 101 102  CO2 26 25  GLUCOSE 117* 92  BUN 12 15  CREATININE 0.88 1.05  CALCIUM 8.5 9.2    Recent Labs  01/20/13 0140 01/20/13 0825  TROPONINI 3.33* 2.78*    Lab Results  Component Value Date   CHOL 193 01/19/2013   HDL 34* 01/19/2013   LDLCALC 132* 01/19/2013   TRIG 133 01/19/2013   CHOLHDL 5.7 01/19/2013   Lab Results  Component Value Date   HGBA1C 6.2* 01/19/2013     Lab Results  Component Value Date   TSH 0.952 01/19/2013    Hepatic Function Panel  Recent Labs  01/19/13 1300  PROT 6.3  ALBUMIN 3.1*  AST 28  ALT 9  ALKPHOS 112  BILITOT 0.2*     Recent Labs  01/19/13 0820  CHOL 193   No results found for this basename: PROTIME,  in the last 72 hours      Studies/Results: Echo:  Left ventricle: The cavity size was normal. Systolic function was normal. The estimated ejection fraction was in the range of 55% to 60%. Mild hypokinesis of the distalanterolateral myocardium. Left ventricular diastolic function parameters were normal  Medications: I have reviewed the patient's current medications. Scheduled Meds: . amLODipine  2.5 mg Oral Daily  . antiseptic oral rinse  15 mL Mouth Rinse BID  . aspirin EC  81 mg Oral Daily  . atorvastatin  80 mg Oral q1800  . clopidogrel  75 mg Oral Q breakfast  . enoxaparin (LOVENOX) injection  40 mg Subcutaneous Q24H  . lisinopril  5 mg Oral Daily  . nicotine  21 mg Transdermal Daily  . pantoprazole  40 mg Oral Q0600   Continuous Infusions: . sodium chloride Stopped (01/20/13 0500)   PRN Meds:.acetaminophen, albuterol, ALPRAZolam, influenza vac split quadrivalent PF, ipratropium, morphine injection, nitroGLYCERIN, ondansetron (ZOFRAN) IV, pneumococcal 23 valent vaccine, zolpidem  Assessment/Plan: Principal Problem:   Non-STEMI (non-ST elevated myocardial infarction), involving RCA/ LAD Active Problems:   CAD (coronary artery disease) RCA non dominant vessel, LAD 40%, 80-90% 2nd diag EF 55%    Hyperlipidemia LDL  goal < 70   Pulmonary nodules   Hypokalemia   Tobacco abuse   Metabolic syndrome, with mildly elevated HgBA1C  PLAN:  See Dr. Erich Montane note for cardiology recommendations.  Pk troponin 6.74. Pulmonary saw pt for lung nodules will need FU CT scan in 4 months and follow up with Dr. Vassie Loll in [place].  Will have dietician to see for diabetic diet. Diet changed yesterday with improved glucose.  Statin has been added. Pt on ACE and norvasc.  Unable to add BB due to bradycardia.   LOS: 2 days   Time spent with pt. :20 minutes. [facility]  Nurse Practitioner  Certified Pager [phone] 01/21/2013, 11:29 AM  I have seen and evaluated the patient this AM along with Nada Boozer, NP. I agree with her findings, examination as well as impression recommendations.  Stable today without any angina. Walked all over the unit.   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 Embolic.    Echo would tend to corroborate this with distal Ant-lat HK  Would order OP Myoview at [facility] to determine significance of residual LAD lesion.  I suspect RCA occlusion is Chronic given the extent of collateral flow.  Agree with dietician - A1c borderline. On statin. No BB due to bradycardia.  ON ACE-I & Amlodipine CCB, tolerating With existing CAD - agree with 12 months min of DAPT.  I think he should be fine for d/c after dietary conseling today. Smoking cessation counseling given - 5 min (myself + [facility] RN)  Stable for d/c - needs ROV in ~2 wks (with Dr. Wonda Cheng or Nada Boozer, NP)  Marykay Lex, M.D., M.S. [facility] [address] [place], [place]  [address]  [phone] Pager # [phone] 01/21/2013 12:30 PM

## 2013-01-21 NOTE — Discharge Summary (Signed)
Physician Discharge Summary       [name] MRN: [id number] DOB/AGE: 09-05-1945 67 y.o.  Admit date: 01/19/2013 Discharge date: 01/21/2013  Discharge Diagnoses:  Principal Problem:   Non-STEMI (non-ST elevated myocardial infarction), involving RCA/ LAD-diag  Active Problems:   CAD (coronary artery disease) RCA non dominant vessel, LAD 50-60%, 80-90% 2nd diag EF 55%    Hyperlipidemia LDL goal < 70   Pulmonary nodules   Hypokalemia-replaced   Tobacco abuse   Metabolic syndrome, with mildly elevated HgBA1C   Discharged Condition: good  Procedures: 01/19/2013 cardiac cath by Dr. Rachelle Hora Course: 67 year old Caucasian male who was transferred from [facility] emergency department with concerns for STEMI. According to the patient he started having chest pain on the left side of his chest as if he pulled a muscle about 6 PM, this pain was coming and going and patient continued to have mild chest pain on presentation to our Cath Lab which he reported to be 1/10. Patient had associated shortness of breath and nausea. Patient denied recent symptoms of PND orthopnea, lower extremity edema, frequent or prolonged palpitations.  Pt arrived directly to the cath lab and underwent LHC, coronary angiography, LV gram as a NSTEMI.     Cardiac cath revealed multi-vessel coronary obstructive disease with 50-60% smooth ostial narrowing of the LAD, 80-90% stenosis in the diminutive second diagonal branch of the LAD; dominant left circumflex carotid artery with 70% ostial narrowing in the first marginal branch and diffuse 50% narrowing in the second marginal branch; and nondominant right carotid artery with 95% proximal stenosis, subtotal/total mid RCA occlusion with moderate right to right bridging collaterals and extensive left to right collaterals.  Medical therapy recommended.  Later that day troponin peaked at 6.7.  Pt had one episode of discomfort relief with morphine.  ECG shows signs  of evolutionary Anterior MI --> possibly the prox LAD lesion was the culprit with the severe Diag stenosis as the residual of plaque shift vs. Embolic.  Echo would tend to corroborate this with distal Ant-lat HK  Would order OP Myoview at [facility] to determine significance of residual LAD lesion.  Suspect RCA occlusion is Chronic given the extent of collateral flow.     Pt has done well, ambulating without angina.  He did have CT angio of chest in [facility] hospital.  Lung nodules were noted.  Pulmonary consult was obtained and they will follow as outpatient.  Additionally HgBA1C was elevated, pt had dietary consult to discuss diabetic diet.    Pt underwent smoking cessation discussions with Cardiac rehab and Dr. Herbie Baltimore and myself.  He was discharged with nicoderm patch.    We were unable to add BB due to episodic bradycardia.  He was discharged with ACE, Statin and Norvasc.  Along with Plavix and ASA.  He will need Plavix for 1 year.   Consults: pulmonary/intensive care  Significant Diagnostic Studies:  BMET    Component Value Date/Time   NA 139 01/21/2013 0510   K 3.8 01/21/2013 0510   CL 102 01/21/2013 0510   CO2 25 01/21/2013 0510   GLUCOSE 92 01/21/2013 0510   BUN 15 01/21/2013 0510   CREATININE 1.05 01/21/2013 0510   CALCIUM 9.2 01/21/2013 0510   GFRNONAA 70* 01/21/2013 0510   GFRAA 82* 01/21/2013 0510    CBC    Component Value Date/Time   WBC 8.6 01/21/2013 0510   RBC 4.01* 01/21/2013 0510   HGB 12.8* 01/21/2013 0510   HCT 37.9* 01/21/2013 0510  PLT 165 01/21/2013 0510   MCV 94.5 01/21/2013 0510   MCH 31.9 01/21/2013 0510   MCHC 33.8 01/21/2013 0510   RDW 13.4 01/21/2013 0510   T chol 193, TG 133, HDL 34, LDL 132   TSH 0.952  2D Echo: Left ventricle: The cavity size was normal. Systolic function was normal. The estimated ejection fraction was in the range of 55% to 60%. Mild hypokinesis of the distalanterolateral myocardium. Left ventricular  diastolic function parameters were normal    Discharge Exam: Blood pressure 130/64, pulse 61, temperature 97.8 F (36.6 C), temperature source Oral, resp. rate 20, height 6' (1.829 m), weight 164 lb 3.9 oz (74.5 kg), SpO2 98.00%. AM Exam:  PE: per MD  General:Pleasant affect, NAD  Skin:Warm and dry, brisk capillary refill  HEENT:normocephalic, sclera clear, mucus membranes moist  Neck:supple, no JVD, no bruits  Heart:S1S2 RRR without murmur, gallup, rub or click  Lungs:clear without rales, rhonchi, or wheezes  ZOX:WRUE, non tender, + BS, do not palpate liver spleen or masses  Ext:no lower ext edema, 2+ pedal pulses, 2+ radial pulses  Neuro:alert and oriented, MAE, follows commands, + facial symmetry   Disposition: 01-Home or Self Care       Future Appointments Provider Department Dept Phone   02/04/2013 3:30 PM Lennette Bihari, MD [address] [facility] [phone]       Medication List    STOP taking these medications       ranitidine 150 MG tablet  Commonly known as:  ZANTAC      TAKE these medications       acetaminophen 325 MG tablet  Commonly known as:  TYLENOL  Take 2 tablets (650 mg total) by mouth every 4 (four) hours as needed for headache or mild pain.     amLODipine 2.5 MG tablet  Commonly known as:  NORVASC  Take 1 tablet (2.5 mg total) by mouth daily.     aspirin 81 MG EC tablet  Take 1 tablet (81 mg total) by mouth daily.     atorvastatin 80 MG tablet  Commonly known as:  LIPITOR  Take 1 tablet (80 mg total) by mouth daily at 6 PM.     clopidogrel 75 MG tablet  Commonly known as:  PLAVIX  Take 1 tablet (75 mg total) by mouth daily with breakfast.     ibuprofen 200 MG tablet  Commonly known as:  ADVIL,MOTRIN  Take 200 mg by mouth every 6 (six) hours as needed.     lisinopril 5 MG tablet  Commonly known as:  PRINIVIL,ZESTRIL  Take 1 tablet (5 mg total) by mouth daily.     nicotine 21 mg/24hr patch  Commonly known as:  NICODERM CQ - dosed  in mg/24 hours  Place 1 patch (21 mg total) onto the skin daily.     nitroGLYCERIN 0.4 MG SL tablet  Commonly known as:  NITROSTAT  Place 1 tablet (0.4 mg total) under the tongue every 5 (five) minutes x 3 doses as needed for chest pain.     pantoprazole 40 MG tablet  Commonly known as:  PROTONIX  Take 1 tablet (40 mg total) by mouth daily at 6 (six) AM.       Follow-up Information   Follow up with Oretha Milch., MD. Call in 4 months. (we will arrange for CT scan at [facility] priro to appt)    Specialty:  Pulmonary Disease   Contact information:   [address] [place] [address] [phone]  Follow up with Lennette Bihari, MD On 02/04/2013. (at 3:30 pm )    Specialty:  Cardiology   Contact information:   [address] [place] [address] [phone]        Discharge Instructions: You need diabetic diet- very little sugar.  Use brown rice instead of white, sweet potatoes instead of white, whole wheat bread instead of white bread.    Call [facility]  [phone] if any bleeding, swelling or drainage at cath site.  May shower, no tub baths for 48 hours for groin sticks.   No driving for 1 week.  No lifting over 5 pounds for 1 week.  No work for 1 week.  Call our office if any problems ( lightheadedness, chest pain or shortness of breath) or questions.  Take 1 NTG, under your tongue, while sitting.  If no relief of pain may repeat NTG, one tab every 5 minutes up to 3 tablets total over 15 minutes.  If no relief CALL 911.  If you have dizziness/lightheadness  while taking NTG, stop taking and call 911.        No smoking, I ordered Nicoderm patches to help you stop smoking.  So not smoke and wear patches this may cause a heart attack.    We will schedule a stress test once you have seen Dr. Tresa Endo back.   Signed: Leone Brand Nurse Practitioner-Certified [facility]: [facility]ART[facility] 01/21/2013, 2:32 PM  Time spent on  discharge : 45 minutes.    I have seen and evaluated the patient this AM along with Nada Boozer, NP. I agree with her findings, examination as well as impression recommendations.  Stable today without any angina. Walked all over the unit.  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 Embolic.  Echo would tend to corroborate this with distal Ant-lat HK  Would order OP Myoview at [facility] to determine significance of residual LAD lesion.  I suspect RCA occlusion is Chronic given the extent of collateral flow. Agree with dietician - A1c borderline.  On statin.  No BB due to bradycardia. ON ACE-I & Amlodipine CCB, tolerating  With existing CAD - agree with 12 months min of DAPT.  I think he should be fine for d/c after dietary conseling today.  Smoking cessation counseling given - 5 min (myself + [facility] RN)  Stable for d/c - needs ROV in ~2 wks (with Dr. Wonda Cheng or Nada Boozer, NP)    Marykay Lex, M.D., M.S. [facility] [address] [place], [place]  [address]  [phone] Pager # [phone] 01/21/2013 4:11 PM

## 2013-01-21 NOTE — Telephone Encounter (Signed)
Message copied by Tommie Sams on Tue Jan 21, 2013  1:06 PM ------      Message from: Cyril Mourning V      Created: Mon Jan 20, 2013 12:32 PM       Arrange for FU CT chest - no contrast at [facility] in 4months & FU after ------

## 2013-01-21 NOTE — Plan of Care (Signed)
Problem: Food- and Nutrition-Related Knowledge Deficit (NB-1.1) Goal: Nutrition education Formal process to instruct or train a patient/client in a skill or to impart knowledge to help patients/clients voluntarily manage or modify food choices and eating behavior to maintain or improve health. Outcome: Completed/Met Date Met:  01/21/13  RD consulted for nutrition education regarding diabetes.     Lab Results  Component Value Date    HGBA1C 6.2* 01/19/2013    RD provided "Carbohydrate Counting for People with Diabetes" handout from the Academy of Nutrition and Dietetics. Discussed different food groups and their effects on blood sugar, emphasizing carbohydrate-containing foods. Provided list of carbohydrates and recommended serving sizes of common foods.  Discussed importance of controlled and consistent carbohydrate intake throughout the day. Provided examples of ways to balance meals/snacks and encouraged intake of high-fiber, whole grain complex carbohydrates. Teach back method used.  Expect fair compliance.  Body mass index is 22.27 kg/(m^2). Pt meets criteria for Normal based on current BMI.  Current diet order is Carbohydrate Modified Medium Calorie, patient is consuming approximately 100% of meals at this time. Labs and medications reviewed. No further nutrition interventions warranted at this time. If additional nutrition issues arise, please re-consult RD.  Maureen Chatters, RD, LDN Pager #: [phone] After-Hours Pager #: [phone]

## 2013-01-21 NOTE — Progress Notes (Signed)
Pt discharged per Md order and protocol. Discharge instructions reviewed with patient, all questions answered. Pt given all prescriptions and aware of all follow up appointments.

## 2013-02-04 ENCOUNTER — Ambulatory Visit (INDEPENDENT_AMBULATORY_CARE_PROVIDER_SITE_OTHER): Payer: Medicare Other | Admitting: Cardiovascular Disease

## 2013-02-04 ENCOUNTER — Encounter: Payer: Self-pay | Admitting: Cardiovascular Disease

## 2013-02-04 VITALS — BP 120/50 | HR 50 | Ht 70.0 in | Wt 173.0 lb

## 2013-02-04 DIAGNOSIS — R5381 Other malaise: Secondary | ICD-10-CM

## 2013-02-04 DIAGNOSIS — E8881 Metabolic syndrome: Secondary | ICD-10-CM

## 2013-02-04 DIAGNOSIS — E782 Mixed hyperlipidemia: Secondary | ICD-10-CM

## 2013-02-04 DIAGNOSIS — Z79899 Other long term (current) drug therapy: Secondary | ICD-10-CM

## 2013-02-04 DIAGNOSIS — I2510 Atherosclerotic heart disease of native coronary artery without angina pectoris: Secondary | ICD-10-CM

## 2013-02-04 DIAGNOSIS — Z720 Tobacco use: Secondary | ICD-10-CM

## 2013-02-04 DIAGNOSIS — F17200 Nicotine dependence, unspecified, uncomplicated: Secondary | ICD-10-CM

## 2013-02-04 DIAGNOSIS — E785 Hyperlipidemia, unspecified: Secondary | ICD-10-CM

## 2013-02-04 NOTE — Patient Instructions (Signed)
Your physician recommends that you schedule a follow-up appointment in: 3 months.  Your physician recommends that you return for lab work FASTING.

## 2013-02-12 ENCOUNTER — Telehealth: Payer: Self-pay | Admitting: Cardiovascular Disease

## 2013-02-12 NOTE — Telephone Encounter (Signed)
Need signature for lab order as pt brought in E-Req for [facility].  Order stamped w/ Dr. Landry Dyke signature and faxed per request.

## 2013-02-13 ENCOUNTER — Encounter: Payer: Self-pay | Admitting: Cardiovascular Disease

## 2013-02-13 NOTE — Progress Notes (Signed)
[name], male   DOB: 18-Sep-1945, 67 y.o.   MRN: [id number]     [name] is a 67 y.o. male present for followup visit following his recent emergent cardiac catheterization which was done on 01/19/2013.  Steven Drake is a 67 year old Caucasian male who was transferred acutely for [facility] on 01/19/2013 with possible ST segment elevation myocardial infarction. The patient had experienced episodes of chest pain since [place] 67 PM. Initial troponin was negative but a subsequent troponin was positive and he was noted to have anterolateral ST changes. Upon arrival to: Hospital he did have T-wave inversion in 1 and L. V4 through V6. Cardiac catheterization done emergently by me showed low normal LV function with moderately severe focal hypocontractility of the mid anterolateral wall and minimal hypocontractility of the focal inferolateral wall. Ejection fraction was approximately 50-55%. He was found to have 40% smooth ostial narrowing of the LAD, a dominant left circumflex vessel with 70% ostial narrowing in the first marginal branch and diffuse 50% narrowing in the second marginal branch. His right coronary artery was nondominant and had a 95% proximal stenosis and he had a subtotal/total mid occlusion with moderate right to right bridging collaterals and extensive left to right collaterals. Medical therapy was recommended and smoking cessation was felt to be imperative. Since hospital discharge, he denies recurrent episodes of chest pain. He does note some left chronic shoulder discomfort. He denies shortness of breath. He denies palpitations.  Past Medical History  Diagnosis Date  . GERD (gastroesophageal reflux disease)   . STEMI (ST elevation myocardial infarction), 01/19/13 01/19/2013  . CAD (coronary artery disease) RCA non dominant vessel, LAD 40%, 80-90% 2nd diag EF 55%  01/19/2013  . Hyperlipidemia LDL goal < 70 01/19/2013  . Pulmonary nodules 01/19/2013  .  Hypokalemia 01/20/2013  . Tobacco abuse 01/20/2013  . Metabolic syndrome, with mildly elevated HgBA1C 01/20/2013    Past Surgical History  Procedure Laterality Date  . Appendectomy      No Known Allergies  Current Outpatient Prescriptions  Medication Sig Dispense Refill  . acetaminophen (TYLENOL) 325 MG tablet Take 2 tablets (650 mg total) by mouth every 4 (four) hours as needed for headache or mild pain.      Marland Kitchen amLODipine (NORVASC) 2.5 MG tablet Take 1 tablet (2.5 mg total) by mouth daily.  30 tablet  6  . aspirin EC 81 MG EC tablet Take 1 tablet (81 mg total) by mouth daily.      Marland Kitchen atorvastatin (LIPITOR) 80 MG tablet Take 1 tablet (80 mg total) by mouth daily at 6 PM.  30 tablet  6  . clopidogrel (PLAVIX) 75 MG tablet Take 1 tablet (75 mg total) by mouth daily with breakfast.  30 tablet  6  . ibuprofen (ADVIL,MOTRIN) 200 MG tablet Take 200 mg by mouth every 6 (six) hours as needed.      Marland Kitchen lisinopril (PRINIVIL,ZESTRIL) 5 MG tablet Take 1 tablet (5 mg total) by mouth daily.  30 tablet  6  . nitroGLYCERIN (NITROSTAT) 0.4 MG SL tablet Place 1 tablet (0.4 mg total) under the tongue every 5 (five) minutes x 3 doses as needed for chest pain.  25 tablet  4  . pantoprazole (PROTONIX) 40 MG tablet Take 1 tablet (40 mg total) by mouth daily at 6 (six) AM.  30 tablet  6   No current facility-administered medications for this visit.    History   Social History  . Marital Status: Widowed  Spouse Name: N/A    Number of Children: N/A  . Years of Education: N/A   Occupational History  . farmer    Social History Main Topics  . Smoking status: Former Smoker -- 1.50 packs/day for 75 years    Types: Cigarettes    Start date: 01/21/1963  . Smokeless tobacco: Never Used     Comment: quit smoking   . Alcohol Use: Not on file  . Drug Use: Not on file  . Sexual Activity: Not on file   Other Topics Concern  . Not on file   Social History Narrative  . No narrative on file   Social  history is notable in that he is widowed. He does not have children. He is retired. He had smoked for 55 years but quit smoking 2 weeks ago at the time of his acute event. He does not drink alcohol.   History reviewed. No pertinent family history.  ROS is negative for fevers, chills or night sweats.  He denies skin rash or bleeding. He denies visual changes. He denies changes in hearing. He denies lymphadenopathy. He does have a history of cough intermittently. He denies recent wheezing. He denies palpitations. He denies recurrent chest pain. He denies nausea vomiting or diarrhea. He denies claudication. He denies myalgias. He denies significant edema. There is no history of diabetes. He denies endocrine disorders. He denies significant issues with sleep. He does have musculoskeletal issues with left chronic shoulder discomfort.  Other comprehensive 14 point system review is negative.  PE BP 120/50  Pulse 50  Ht 5\' 10"  (1.778 m)  Wt 173 lb (78.472 kg)  BMI 24.82 kg/m2  General: Alert, oriented, no distress.  Skin: normal turgor, no rashes HEENT: Normocephalic, atraumatic. Pupils round and reactive; sclera anicteric;no lid lag.  Nose without nasal septal hypertrophy Mouth/Parynx benign; Mallinpatti scale 3 Neck: No JVD, no carotid briuts Lungs: Decreased breath sounds; no wheezing or rales Chest wall: no tenderness to palpitation Heart: RRR, s1 s2 normal 1/6 sem Abdomen: soft, nontender; no hepatosplenomehaly, BS+; abdominal aorta nontender and not dilated by palpation. Back: no CVA tenderness Pulses 2+ Extremities: no clubbing cyanosis or edema, Homan's sign negative  Neurologic: grossly nonfocal Psychologic: normal affect and mood.  ECG: Sinus rhythm with T-wave inversion in leads 1 and avL and V2  LABS:  BMET    Component Value Date/Time   NA 139 01/21/2013 0510   K 3.8 01/21/2013 0510   CL 102 01/21/2013 0510   CO2 25 01/21/2013 0510   GLUCOSE 92 01/21/2013 0510   BUN 15  01/21/2013 0510   CREATININE 1.05 01/21/2013 0510   CALCIUM 9.2 01/21/2013 0510   GFRNONAA 70* 01/21/2013 0510   GFRAA 82* 01/21/2013 0510     Hepatic Function Panel     Component Value Date/Time   PROT 6.3 01/19/2013 1300   ALBUMIN 3.1* 01/19/2013 1300   AST 28 01/19/2013 1300   ALT 9 01/19/2013 1300   ALKPHOS 112 01/19/2013 1300   BILITOT 0.2* 01/19/2013 1300     CBC    Component Value Date/Time   WBC 8.6 01/21/2013 0510   RBC 4.01* 01/21/2013 0510   HGB 12.8* 01/21/2013 0510   HCT 37.9* 01/21/2013 0510   PLT 165 01/21/2013 0510   MCV 94.5 01/21/2013 0510   MCH 31.9 01/21/2013 0510   MCHC 33.8 01/21/2013 0510   RDW 13.4 01/21/2013 0510     BNP No results found for this basename: probnp    Lipid Panel  Component Value Date/Time   CHOL 193 01/19/2013 0820   TRIG 133 01/19/2013 0820   HDL 34* 01/19/2013 0820   CHOLHDL 5.7 01/19/2013 0820   VLDL 27 01/19/2013 0820   LDLCALC 132* 01/19/2013 0820     RADIOLOGY: No results found.    ASSESSMENT AND PLAN: My impression is that Steven Drake is a 67 year old gentleman who has a previous 55 year tobacco history and fortunately quit smoking 2 weeks ago and he presented with an acute coronary syndrome. He was found to have subtotal occlusion of his right coronary artery and had both right to right and extensive left-to-right collaterals. He also had concomitant CAD as noted above with 80-90% stenosis in a small second diagonal branch of his LAD as well as 70 and 50% stenoses in his marginal vessels. He did have motion abnormality consistent with his diagonal stenosis. Presently, he has been angina free. He now is on low-dose ACE inhibitor and a dual antiplatelet therapy with aspirin and Plavix. He is on Lipitor for aggressive lipid intervention and also is on amlodipine 2.5 mg. In 3 months, I have recommended a CBC, CMP, lipid panel and TSH level and I will see him back in followup. We discussed increase activity as  tolerated. I commended him on his smoking cessation.     Lennette Bihari, MD, [facility]  02/13/2013 4:24 PM

## 2013-02-24 ENCOUNTER — Telehealth: Payer: Self-pay | Admitting: Cardiovascular Disease

## 2013-02-24 NOTE — Telephone Encounter (Signed)
Would like to know the lab results .Marland Kitchen Please call    Thanks

## 2013-02-24 NOTE — Telephone Encounter (Signed)
Nelida Gores, emergency contact, who is patient's caregiver - informed that lab report from [facility] was received but is awaiting review from Dr. Tresa Endo and they will be notified of results once he looks at labs.

## 2013-03-06 ENCOUNTER — Telehealth: Payer: Self-pay | Admitting: Cardiovascular Disease

## 2013-03-06 NOTE — Telephone Encounter (Signed)
Never did get results of lab work.  Was waiting on lab results to determine if can take off some of his medicine.  Please call.

## 2013-03-06 NOTE — Telephone Encounter (Signed)
Message forwarded to Dr. Kelly/Wanda, CMA.  

## 2013-03-07 ENCOUNTER — Telehealth: Payer: Self-pay | Admitting: *Deleted

## 2013-03-07 NOTE — Telephone Encounter (Signed)
Informed patient 's contact person Dr. Claiborne Billings hasn't reviewed labs. Once he has done so they will be informed of the results.

## 2013-03-07 NOTE — Telephone Encounter (Signed)
Returned a call to patient. Could not leave a message-mailbox full. Patient's results hasn't been reviewed by Dr. Claiborne Billings yet.

## 2013-03-18 ENCOUNTER — Encounter: Payer: Self-pay | Admitting: *Deleted

## 2013-05-06 ENCOUNTER — Other Ambulatory Visit: Payer: Self-pay | Admitting: *Deleted

## 2013-05-06 ENCOUNTER — Encounter: Payer: Self-pay | Admitting: Cardiovascular Disease

## 2013-05-06 ENCOUNTER — Ambulatory Visit (INDEPENDENT_AMBULATORY_CARE_PROVIDER_SITE_OTHER): Payer: Medicare Other | Admitting: Cardiovascular Disease

## 2013-05-06 VITALS — BP 118/56 | HR 51 | Ht 70.0 in | Wt 171.5 lb

## 2013-05-06 DIAGNOSIS — Z720 Tobacco use: Secondary | ICD-10-CM

## 2013-05-06 DIAGNOSIS — I214 Non-ST elevation (NSTEMI) myocardial infarction: Secondary | ICD-10-CM

## 2013-05-06 DIAGNOSIS — I2510 Atherosclerotic heart disease of native coronary artery without angina pectoris: Secondary | ICD-10-CM

## 2013-05-06 DIAGNOSIS — F17200 Nicotine dependence, unspecified, uncomplicated: Secondary | ICD-10-CM

## 2013-05-06 DIAGNOSIS — E785 Hyperlipidemia, unspecified: Secondary | ICD-10-CM

## 2013-05-06 MED ORDER — ATORVASTATIN CALCIUM 80 MG PO TABS: 80.0000 mg | ORAL_TABLET | Freq: Every day | ORAL | Status: DC

## 2013-05-06 MED ORDER — CLOPIDOGREL BISULFATE 75 MG PO TABS: 75.0000 mg | ORAL_TABLET | Freq: Every day | ORAL | Status: DC

## 2013-05-06 MED ORDER — AMLODIPINE BESYLATE 2.5 MG PO TABS: 2.5000 mg | ORAL_TABLET | Freq: Every day | ORAL | Status: DC

## 2013-05-06 MED ORDER — PANTOPRAZOLE SODIUM 40 MG PO TBEC: 40.0000 mg | DELAYED_RELEASE_TABLET | Freq: Every day | ORAL | Status: DC

## 2013-05-06 MED ORDER — LISINOPRIL 5 MG PO TABS: 5.0000 mg | ORAL_TABLET | Freq: Every day | ORAL | Status: DC

## 2013-05-06 NOTE — Patient Instructions (Signed)
Your physician recommends that you schedule a follow-up appointment in: 4 months  

## 2013-06-02 ENCOUNTER — Encounter: Payer: Self-pay | Admitting: Cardiovascular Disease

## 2013-06-02 NOTE — Progress Notes (Signed)
Patient ID: Joakim Huesman, male   DOB: 07/17/1945, 68 y.o.   MRN: [id number]      HPI: Denson Niccoli is a 68 y.o. male present for followup cardiology evaluation. I last saw him 3 months ago in followup of his emergent  Cardiac catheterization.  Mr. Sloop is a 68 year old Caucasian male who was transferred acutely from [facility] on 01/19/2013 with possible ST segment elevation myocardial infarction.  Initial troponin was negative but a subsequent troponin was positive and he was noted to have anterolateral ST changes. Upon arrival to [facility] he did have T-wave inversion in 1 and L. V4 through V6. Cardiac catheterization done emergently by me showed low normal LV function with moderately severe focal hypocontractility of the mid anterolateral wall and minimal hypocontractility of the focal inferolateral wall. Ejection fraction was approximately 50-55%. He was found to have 40% smooth ostial narrowing of the LAD, a dominant left circumflex vessel with 70% ostial narrowing in the first marginal branch and diffuse 50% narrowing in the second marginal branch. His right coronary artery was nondominant and had a 95% proximal stenosis and he had a subtotal/total mid occlusion with moderate right to right bridging collaterals and extensive left to right collaterals. Medical therapy was recommended and smoking cessation was felt to be imperative. Since hospital discharge, he denies recurrent episodes of chest pain. He does note some left chronic shoulder discomfort. He denies shortness of breath. He denies palpitations.  Since I last saw him, he has continued to do fairly well. Unfortunately, he never did quit smoking. He has been taking Lipitor 80 mg for hyperlipidemia. He has continued to take aspirin and Plavix for dual antiplatelet therapy. He also has been on amlodipine and lisinopril for CAD and blood pressure and takes protonic for GERD.  Past Medical History  Diagnosis Date  . GERD  (gastroesophageal reflux disease)   . STEMI (ST elevation myocardial infarction), 01/19/13 01/19/2013  . CAD (coronary artery disease) RCA non dominant vessel, LAD 40%, 80-90% 2nd diag EF 55%  01/19/2013  . Hyperlipidemia LDL goal < 70 01/19/2013  . Pulmonary nodules 01/19/2013  . Hypokalemia 01/20/2013  . Tobacco abuse 01/20/2013  . Metabolic syndrome, with mildly elevated HgBA1C 01/20/2013    Past Surgical History  Procedure Laterality Date  . Appendectomy      No Known Allergies  Current Outpatient Prescriptions  Medication Sig Dispense Refill  . acetaminophen (TYLENOL) 325 MG tablet Take 2 tablets (650 mg total) by mouth every 4 (four) hours as needed for headache or mild pain.      Marland Kitchen aspirin EC 81 MG EC tablet Take 1 tablet (81 mg total) by mouth daily.      . nitroGLYCERIN (NITROSTAT) 0.4 MG SL tablet Place 1 tablet (0.4 mg total) under the tongue every 5 (five) minutes x 3 doses as needed for chest pain.  25 tablet  4  . amLODipine (NORVASC) 2.5 MG tablet Take 1 tablet (2.5 mg total) by mouth daily.  30 tablet  6  . atorvastatin (LIPITOR) 80 MG tablet Take 1 tablet (80 mg total) by mouth daily at 6 PM.  30 tablet  6  . clopidogrel (PLAVIX) 75 MG tablet Take 1 tablet (75 mg total) by mouth daily with breakfast.  30 tablet  6  . lisinopril (PRINIVIL,ZESTRIL) 5 MG tablet Take 1 tablet (5 mg total) by mouth daily.  30 tablet  6  . pantoprazole (PROTONIX) 40 MG tablet Take 1 tablet (40 mg total) by mouth daily  at 6 (six) AM.  30 tablet  6   No current facility-administered medications for this visit.    History   Social History  . Marital Status: Widowed    Spouse Name: N/A    Number of Children: N/A  . Years of Education: N/A   Occupational History  . farmer    Social History Main Topics  . Smoking status: Current Every Day Smoker -- 1.00 packs/day for 75 years    Types: Cigarettes    Start date: 01/21/1963  . Smokeless tobacco: Never Used  . Alcohol Use: Not on file   . Drug Use: Not on file  . Sexual Activity: Not on file   Other Topics Concern  . Not on file   Social History Narrative  . No narrative on file   Social history is notable in that he is widowed. He does not have children. He is retired. He had smoked for 55 years but quit smoking 2 weeks ago at the time of his acute event. He does not drink alcohol.   History reviewed. No pertinent family history.  ROS is negative for fevers, chills or night sweats.  He denies skin rash or bleeding. He denies visual changes. He denies changes in hearing. He denies lymphadenopathy. He does have a history of cough intermittently. He denies recent wheezing. He denies palpitations. He denies recurrent chest pain. He denies nausea vomiting or diarrhea. He denies claudication. He denies myalgias. He denies significant edema. There is no history of diabetes. He denies endocrine disorders. He denies significant issues with sleep. He does have musculoskeletal issues with left chronic shoulder discomfort.  Other comprehensive 14 point system review is negative.  PE BP 118/56  Pulse 51  Ht 5\' 10"  (1.778 m)  Wt 171 lb 8 oz (77.792 kg)  BMI 24.61 kg/m2  General: Alert, oriented, no distress.  Skin: normal turgor, no rashes HEENT: Normocephalic, atraumatic. Pupils round and reactive; sclera anicteric;no lid lag.  Nose without nasal septal hypertrophy Mouth/Parynx benign; Mallinpatti scale 3 Neck: No JVD, no carotid briuts Lungs: Decreased breath sounds; no wheezing or rales Chest wall: no tenderness to palpitation Heart: RRR, s1 s2 normal 1/6 sem; no diastolic murmur. No rubs thrills or heaves. Abdomen: soft, nontender; no hepatosplenomehaly, BS+; abdominal aorta nontender and not dilated by palpation. Back: no CVA tenderness Pulses 2+ Extremities: no clubbing cyanosis or edema, Homan's sign negative  Neurologic: grossly nonfocal Psychologic: normal affect and mood.  ECG (independently read by me): Sinus  bradycardia 51 beats per minute. No ectopy. No significant ST changes.  Prior ECG from 02/04/2013 ECG: Sinus rhythm with T-wave inversion in leads 1 and avL and V2  LABS:  BMET    Component Value Date/Time   NA 139 01/21/2013 0510   K 3.8 01/21/2013 0510   CL 102 01/21/2013 0510   CO2 25 01/21/2013 0510   GLUCOSE 92 01/21/2013 0510   BUN 15 01/21/2013 0510   CREATININE 1.05 01/21/2013 0510   CALCIUM 9.2 01/21/2013 0510   GFRNONAA 70* 01/21/2013 0510   GFRAA 82* 01/21/2013 0510     Hepatic Function Panel     Component Value Date/Time   PROT 6.3 01/19/2013 1300   ALBUMIN 3.1* 01/19/2013 1300   AST 28 01/19/2013 1300   ALT 9 01/19/2013 1300   ALKPHOS 112 01/19/2013 1300   BILITOT 0.2* 01/19/2013 1300     CBC    Component Value Date/Time   WBC 8.6 01/21/2013 0510   RBC 4.01* 01/21/2013  0510   HGB 12.8* 01/21/2013 0510   HCT 37.9* 01/21/2013 0510   PLT 165 01/21/2013 0510   MCV 94.5 01/21/2013 0510   MCH 31.9 01/21/2013 0510   MCHC 33.8 01/21/2013 0510   RDW 13.4 01/21/2013 0510     BNP No results found for this basename: probnp    Lipid Panel     Component Value Date/Time   CHOL 193 01/19/2013 0820   TRIG 133 01/19/2013 0820   HDL 34* 01/19/2013 0820   CHOLHDL 5.7 01/19/2013 0820   VLDL 27 01/19/2013 0820   LDLCALC 132* 01/19/2013 0820     RADIOLOGY: No results found.    ASSESSMENT AND PLAN:  Mr. Ayoub is a 68 year old gentleman who has a previous 67 year tobacco history and presented with an acute coronary syndrome and underwent urgent cardiac catheterization 01/19/2013. He was found to have subtotal occlusion of his right coronary artery and had both right to right and extensive left-to-right collaterals. He also had concomitant CAD as noted above with 80-90% stenosis in a small second diagonal branch of his LAD as well as 70 and 50% stenoses in his marginal vessels. He did have motion abnormality consistent with his diagonal stenosis. The last 3  months, he denies recurrent chest pain symptomatology on his current medical regimen. He now is on high dose lipid lowering therapy with atorvastatin. His LDL on presentation was elevated at 132. Recent blood work done on March 10 at [facility] showed glucose 111. He had normal renal function. Cholesterol is significantly improved and now is 139 with an HDL of 35 LDL 87 VLDL 17. Triglycerides were 87. His blood pressure today is well controlled on lisinopril 5 mg and amlodipine 2.5 mg. He continues to be unduly antiplatelet therapy. There is no bleeding. I again had a long discussion with him concerning the importance of smoking cessation. I will see him in 4 months for cardiology reevaluation.    Troy Sine, MD, [facility]  06/02/2013 4:54 PM

## 2013-06-11 ENCOUNTER — Telehealth: Payer: Self-pay | Admitting: Pulmonary Disease

## 2013-06-11 NOTE — Telephone Encounter (Signed)
Please advise PCC;s thanks 

## 2013-06-12 NOTE — Telephone Encounter (Signed)
Ct scheduled @[facility]  06/20/13@4pm  pt is aware Joellen Jersey

## 2013-06-12 NOTE — Telephone Encounter (Signed)
Pt returned call & is available after 5:00-as close to 5:30 as possible, please.  Steven Drake

## 2013-06-12 NOTE — Telephone Encounter (Signed)
Scheduled ct for march but had no phone#'s to reach pt also mailed a note to his home i will set up [email or web address]  and call pt back Steven Drake

## 2013-06-12 NOTE — Telephone Encounter (Signed)
Pt # in chart: [phone] Called and LMTCB x1 w/ spouse

## 2013-06-19 ENCOUNTER — Telehealth: Payer: Self-pay | Admitting: Pulmonary Disease

## 2013-06-19 NOTE — Telephone Encounter (Signed)
Please advise pCC's thanks

## 2013-06-19 NOTE — Telephone Encounter (Signed)
Auth# [id number] left on voice mail for angela Joellen Jersey

## 2013-06-20 ENCOUNTER — Telehealth: Payer: Self-pay | Admitting: Pulmonary Disease

## 2013-06-20 NOTE — Telephone Encounter (Signed)
I called made pt aware to bring CT disc just in case we can't pul. His scan in PACS. He will do so. Nothing further needed

## 2013-06-30 ENCOUNTER — Telehealth: Payer: Self-pay | Admitting: Pulmonary Disease

## 2013-06-30 NOTE — Telephone Encounter (Signed)
Pt had CT done at [facility]. This can be pulled in PACS. Please advise RA thanks

## 2013-07-01 NOTE — Telephone Encounter (Signed)
Unchanged nodules Needs Fu scan OV to discuss

## 2013-07-01 NOTE — Telephone Encounter (Signed)
Spouse aware of recs. appt scheduled

## 2013-07-02 ENCOUNTER — Inpatient Hospital Stay: Payer: Medicare Other | Admitting: Pulmonary Disease

## 2013-08-27 ENCOUNTER — Encounter: Payer: Self-pay | Admitting: Pulmonary Disease

## 2013-09-10 ENCOUNTER — Ambulatory Visit (INDEPENDENT_AMBULATORY_CARE_PROVIDER_SITE_OTHER): Payer: Commercial Managed Care - HMO | Admitting: Cardiovascular Disease

## 2013-09-10 ENCOUNTER — Encounter: Payer: Self-pay | Admitting: Cardiovascular Disease

## 2013-09-10 VITALS — BP 128/62 | HR 51 | Ht 72.0 in | Wt 165.8 lb

## 2013-09-10 DIAGNOSIS — R5383 Other fatigue: Secondary | ICD-10-CM

## 2013-09-10 DIAGNOSIS — Z79899 Other long term (current) drug therapy: Secondary | ICD-10-CM

## 2013-09-10 DIAGNOSIS — I214 Non-ST elevation (NSTEMI) myocardial infarction: Secondary | ICD-10-CM

## 2013-09-10 DIAGNOSIS — F17200 Nicotine dependence, unspecified, uncomplicated: Secondary | ICD-10-CM

## 2013-09-10 DIAGNOSIS — E785 Hyperlipidemia, unspecified: Secondary | ICD-10-CM

## 2013-09-10 DIAGNOSIS — E782 Mixed hyperlipidemia: Secondary | ICD-10-CM

## 2013-09-10 DIAGNOSIS — Z720 Tobacco use: Secondary | ICD-10-CM

## 2013-09-10 DIAGNOSIS — I2510 Atherosclerotic heart disease of native coronary artery without angina pectoris: Secondary | ICD-10-CM

## 2013-09-10 DIAGNOSIS — R5381 Other malaise: Secondary | ICD-10-CM

## 2013-09-10 NOTE — Progress Notes (Signed)
[name], male   DOB: 1945/11/25, 68 y.o.   MRN: [id number]       [name] is a 68 y.o. male present for a 4 month followup cardiology evaluation.  Steven Drake is a 68 year-old Caucasian male who was transferred acutely from [facility] on 01/19/2013 with possible ST segment elevation myocardial infarction.  Initial troponin was negative but a subsequent troponin was positive and he was noted to have anterolateral ST changes. Upon arrival to [facility] he did have T-wave inversion in 1 and L. V4 through V6. Cardiac catheterization done emergently by me showed low normal LV function with moderately severe focal hypocontractility of the mid anterolateral wall and minimal hypocontractility of the focal inferolateral wall. Ejection fraction was approximately 50-55%. He was found to have 40% smooth ostial narrowing of the LAD, a dominant left circumflex vessel with 70% ostial narrowing in the first marginal branch and diffuse 50% narrowing in the second marginal branch. His right coronary artery was nondominant and had a 95% proximal stenosis and he had a subtotal/total mid occlusion with moderate right to right bridging collaterals and extensive left to right collaterals. Medical therapy was recommended and smoking cessation was felt to be imperative. Since hospital discharge, he denies recurrent episodes of chest pain. He does note some left chronic shoulder discomfort. He denies shortness of breath. He denies palpitations.  Since I last saw him, he has continued to do fairly well. Unfortunately he continues to smoke and is now smoking approximately one pack per day.  He remains active doing work without recurrent anginal symptoms.  He continues to be on dual antiplatelet therapy and denies bleeding.  He also is on lisinopril 5 mg atorvastatin 2.5 mg.  Has history of GERD for which she is on Protonix.  He presents for evaluation.  Past Medical History  Diagnosis Date  . GERD  (gastroesophageal reflux disease)   . STEMI (ST elevation myocardial infarction), 01/19/13 01/19/2013  . CAD (coronary artery disease) RCA non dominant vessel, LAD 40%, 80-90% 2nd diag EF 55%  01/19/2013  . Hyperlipidemia LDL goal < 70 01/19/2013  . Pulmonary nodules 01/19/2013  . Hypokalemia 01/20/2013  . Tobacco abuse 01/20/2013  . Metabolic syndrome, with mildly elevated HgBA1C 01/20/2013    Past Surgical History  Procedure Laterality Date  . Appendectomy      No Known Allergies  Current Outpatient Prescriptions  Medication Sig Dispense Refill  . acetaminophen (TYLENOL) 325 MG tablet Take 2 tablets (650 mg total) by mouth every 4 (four) hours as needed for headache or mild pain.      Marland Kitchen aspirin EC 81 MG EC tablet Take 1 tablet (81 mg total) by mouth daily.      . nitroGLYCERIN (NITROSTAT) 0.4 MG SL tablet Place 1 tablet (0.4 mg total) under the tongue every 5 (five) minutes x 3 doses as needed for chest pain.  25 tablet  4  . amLODipine (NORVASC) 2.5 MG tablet Take 1 tablet (2.5 mg total) by mouth daily.  30 tablet  6  . atorvastatin (LIPITOR) 80 MG tablet Take 1 tablet (80 mg total) by mouth daily at 6 PM.  30 tablet  6  . clopidogrel (PLAVIX) 75 MG tablet Take 1 tablet (75 mg total) by mouth daily with breakfast.  30 tablet  6  . lisinopril (PRINIVIL,ZESTRIL) 5 MG tablet Take 1 tablet (5 mg total) by mouth daily.  30 tablet  6  . pantoprazole (PROTONIX) 40 MG tablet Take  1 tablet (40 mg total) by mouth daily at 6 (six) AM.  30 tablet  6   No current facility-administered medications for this visit.    History   Social History  . Marital Status: Widowed    Spouse Name: N/A    Number of Children: N/A  . Years of Education: N/A   Occupational History  . [employer]    Social History Main Topics  . Smoking status: Current Every Day Smoker -- 1.00 packs/day for 75 years    Types: Cigarettes    Start date: 01/21/1963  . Smokeless tobacco: Never Used  . Alcohol Use: Not on file   . Drug Use: Not on file  . Sexual Activity: Not on file   Other Topics Concern  . Not on file   Social History Narrative  . No narrative on file   Social history is notable in that he is widowed. He does not have children. He is retired. He had smoked for 55 years but quit smoking 2 weeks ago at the time of his acute event. He does not drink alcohol.   History reviewed. No pertinent family history.  ROS General: Negative; No fevers, chills, or night sweats;  HEENT: Negative; No changes in vision or hearing, sinus congestion, difficulty swallowing Pulmonary: Negative; No cough, wheezing, shortness of breath, hemoptysis Cardiovascular: Negative; No chest pain, presyncope, syncope, palpatations GI: Negative; No nausea, vomiting, diarrhea, or abdominal pain GU: Negative; No dysuria, hematuria, or difficulty voiding Musculoskeletal: Occasional shoulder discomfort; no myalgias,  or weakness Hematologic/Oncology: Negative; no easy bruising, bleeding Endocrine: Negative; no heat/cold intolerance; no diabetes Neuro: Negative; no changes in balance, headaches Skin: Negative; No rashes or skin lesions Psychiatric: Negative; No behavioral problems, depression Sleep: Negative; No snoring, daytime sleepiness, hypersomnolence, bruxism, restless legs, hypnogognic hallucinations, no cataplexy Other comprehensive 14 point system review is negative.   PE BP 118/56  Pulse 51  Ht 5\' 10"  (1.778 m)  Wt 171 lb 8 oz (77.792 kg)  BMI 24.61 kg/m2  General: Alert, oriented, no distress.  Appears older than stated age.  The Skin: normal turgor, no rashes HEENT: Normocephalic, atraumatic. Pupils round and reactive; sclera anicteric;no lid lag.  Nose without nasal septal hypertrophy Mouth/Parynx benign; Mallinpatti scale 3 Neck: No JVD, no carotid bruits with normal carotid up to Lungs: Decreased breath sounds; no wheezing or rales Chest wall: no tenderness to palpitation Heart: RRR, s1 s2 normal 1/6  sem; no diastolic murmur. No rubs thrills or heaves. Abdomen: soft, nontender; no hepatosplenomehaly, BS+; abdominal aorta nontender and not dilated by palpation. Back: no CVA tenderness Pulses 2+ Extremities: no clubbing cyanosis or edema, Homan's sign negative  Neurologic: grossly nonfocal Psychologic: normal affect and mood.  ECG (independently read by me): Sinus bradycardia 51 beats per minute.  No ectopy.  QTc interval 414 ms.  Prior 05/06/2013 ECG (independently read by me): Sinus bradycardia 51 beats per minute. No ectopy. No significant ST changes.  Prior ECG from 02/04/2013 ECG: Sinus rhythm with T-wave inversion in leads 1 and avL and V2  LABS:  BMET    Component Value Date/Time   NA 139 01/21/2013 0510   K 3.8 01/21/2013 0510   CL 102 01/21/2013 0510   CO2 25 01/21/2013 0510   GLUCOSE 92 01/21/2013 0510   BUN 15 01/21/2013 0510   CREATININE 1.05 01/21/2013 0510   CALCIUM 9.2 01/21/2013 0510   GFRNONAA 70* 01/21/2013 0510   GFRAA 82* 01/21/2013 0510     Hepatic Function Panel  Component Value Date/Time   PROT 6.3 01/19/2013 1300   ALBUMIN 3.1* 01/19/2013 1300   AST 28 01/19/2013 1300   ALT 9 01/19/2013 1300   ALKPHOS 112 01/19/2013 1300   BILITOT 0.2* 01/19/2013 1300     CBC    Component Value Date/Time   WBC 8.6 01/21/2013 0510   RBC 4.01* 01/21/2013 0510   HGB 12.8* 01/21/2013 0510   HCT 37.9* 01/21/2013 0510   PLT 165 01/21/2013 0510   MCV 94.5 01/21/2013 0510   MCH 31.9 01/21/2013 0510   MCHC 33.8 01/21/2013 0510   RDW 13.4 01/21/2013 0510     BNP No results found for this basename: probnp    Lipid Panel     Component Value Date/Time   CHOL 193 01/19/2013 0820   TRIG 133 01/19/2013 0820   HDL 34* 01/19/2013 0820   CHOLHDL 5.7 01/19/2013 0820   VLDL 27 01/19/2013 0820   LDLCALC 132* 01/19/2013 0820     RADIOLOGY: No results found.    ASSESSMENT AND PLAN:  Steven Drake is a 68 -year-old gentleman who presented with an acute  coronary syndrome and underwent urgent cardiac catheterization 01/19/2013. He was found to have subtotal occlusion of his right coronary artery and had both right to right and extensive left-to-right collaterals. He also had concomitant CAD as noted above with 80-90% stenosis in a small second diagonal branch of his LAD as well as 70 and 50% stenoses in his marginal vessels. He did have motion abnormality consistent with his diagonal stenosis.  He denies recurrent anginal symptoms.  He is a 56 year history of tobacco use and currently still smoking one pack per day.  I again discussed the absolute importance of complete smoking cessation.  Presently, his blood pressure is well controlled on amlodipine 2.5 mg in addition to lisinopril 5 mg.  He is tolerating atorvastatin 80 mg.  I am recommending followup laboratory be obtained consisting of Cmet, TSH, CBC, as well as fasting lipid panel.  He is taking Protonix for GERD.  I will review blood work and adjustments will be made if necessary to his medical regimen.  Troy Sine, MD, [facility]  06/02/2013 4:54 PM

## 2013-09-10 NOTE — Patient Instructions (Signed)
Your physician recommends that you schedule a follow-up appointment in: 6 Months  Your physician recommends that you return for lab work CBC, CMP, TSH, FASTING LIPIDS

## 2013-10-27 ENCOUNTER — Telehealth: Payer: Self-pay | Admitting: Cardiovascular Disease

## 2013-10-27 NOTE — Telephone Encounter (Signed)
Lab order stamped and faxed back to lab

## 2013-10-27 NOTE — Telephone Encounter (Signed)
Ronny Bacon called in stating that she needs Dr. Evette Georges signature for the labs that were orders for this patient. The fax is  [phone]. Patient is in the hospital now. Please call  Thanks

## 2013-10-27 NOTE — Telephone Encounter (Signed)
Close encounter 

## 2013-11-05 ENCOUNTER — Encounter: Payer: Self-pay | Admitting: Cardiovascular Disease

## 2013-11-11 ENCOUNTER — Encounter: Payer: Self-pay | Admitting: Cardiovascular Disease

## 2013-11-11 ENCOUNTER — Telehealth: Payer: Self-pay | Admitting: Cardiovascular Disease

## 2013-11-11 NOTE — Telephone Encounter (Signed)
Labs from 10/27/13 scanned into EPIC but not into Dr. Evette Georges in basket. Labs printed and sent to MR to be scanned to Dr. Claiborne Billings.  Wife notified Dr. Claiborne Billings will review and they will be called with the results.  Voiced understanding.

## 2013-11-11 NOTE — Telephone Encounter (Signed)
Pt wants lab results from about 2 weeks ago,he had it at [facility].

## 2013-11-28 ENCOUNTER — Telehealth: Payer: Self-pay | Admitting: *Deleted

## 2013-11-28 NOTE — Telephone Encounter (Signed)
Left message labs received from [facility] have been reviewed by Dr. Claiborne Billings and reported as good. No changes in therapy based on these results. Call back if questions or concerns,

## 2013-11-28 NOTE — Telephone Encounter (Signed)
Message copied by Lauralee Evener on Fri Nov 28, 2013  4:27 PM ------      Message from: Shelva Majestic A      Created: Tue Nov 11, 2013  7:12 PM       Labs good ------

## 2014-02-05 ENCOUNTER — Encounter (HOSPITAL_COMMUNITY): Payer: Self-pay | Admitting: Cardiovascular Disease

## 2014-03-02 ENCOUNTER — Other Ambulatory Visit: Payer: Self-pay | Admitting: Cardiovascular Disease

## 2014-03-10 ENCOUNTER — Encounter: Payer: Self-pay | Admitting: Cardiovascular Disease

## 2014-03-10 ENCOUNTER — Ambulatory Visit (INDEPENDENT_AMBULATORY_CARE_PROVIDER_SITE_OTHER): Payer: Commercial Managed Care - HMO | Admitting: Cardiovascular Disease

## 2014-03-10 VITALS — BP 130/58 | HR 51 | Ht 71.0 in | Wt 163.3 lb

## 2014-03-10 DIAGNOSIS — I2510 Atherosclerotic heart disease of native coronary artery without angina pectoris: Secondary | ICD-10-CM

## 2014-03-10 DIAGNOSIS — Z720 Tobacco use: Secondary | ICD-10-CM | POA: Diagnosis not present

## 2014-03-10 DIAGNOSIS — Z79899 Other long term (current) drug therapy: Secondary | ICD-10-CM

## 2014-03-10 DIAGNOSIS — I2583 Coronary atherosclerosis due to lipid rich plaque: Secondary | ICD-10-CM

## 2014-03-10 DIAGNOSIS — E785 Hyperlipidemia, unspecified: Secondary | ICD-10-CM

## 2014-03-10 MED ORDER — ATORVASTATIN CALCIUM 80 MG PO TABS: 40.0000 mg | ORAL_TABLET | Freq: Every day | ORAL | Status: DC

## 2014-03-10 NOTE — Patient Instructions (Signed)
Your physician wants you to follow-up in: 6 months or sooner if needed with Dr. Dow Adolph will receive a reminder letter in the mail two months in advance. If you don't receive a letter, please call our office to schedule the follow-up appointment.  Your physician has recommended you make the following change in your medication: decrease the atorvastatin to 40 mg. ( 1/2 tablet daily)   Your physician recommends that you return for lab work in: 6 months. Lab orders will be sent to you in the mail as the time gets closer.

## 2014-03-12 ENCOUNTER — Encounter: Payer: Self-pay | Admitting: Cardiovascular Disease

## 2014-03-12 NOTE — Progress Notes (Signed)
[name], male   DOB: [birth year], 69 y.o.   MRN: [id number]     [name] is a 68 y.o. male who presents for 6 month followup cardiology evaluation.  Steven Drake  was transferred acutely from [facility] on 01/19/2013 with possible ST segment elevation myocardial infarction.  Initial troponin was negative but a subsequent troponin was positive and he was noted to have anterolateral ST changes. Upon arrival to [facility] he had T-wave inversion in 1 and L. V4 through V6. Cardiac catheterization done emergently by me showed low normal LV function with moderately severe focal hypocontractility of the mid anterolateral wall and minimal hypocontractility of the focal inferolateral wall. Ejection fraction was approximately 50-55%. He was found to have 40% smooth ostial narrowing of the LAD, a dominant left circumflex vessel with 70% ostial narrowing in the first marginal branch and diffuse 50% narrowing in the second marginal branch. His RCA was nondominant and had a 95% proximal stenosis and he had a subtotal/total mid occlusion with moderate right to right bridging collaterals and extensive left to right collaterals. Medical therapy was recommended and smoking cessation was felt to be imperative. Since hospital discharge, he denies recurrent episodes of chest pain. He does note some left chronic shoulder discomfort. He denies shortness of breath. He denies palpitations.  Since I last saw him, he has continued to do fairly well. Unfortunately he continues to smoke.  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  He continues to be on dual antiplatelet therapy and denies bleeding.  He also is on lisinopril 5 mg, amlodipine 2.5 mg and atorvastatin 80 mg.  He has history of GERD for which she is on Protonix.  He presents for evaluation.  I have reviewed recent blood work from [facility] which showed his  total cholesterol 115, HDL cholesterol 31, LDL cholesterol 61 and VLDL cholesterol 23.  Past Medical History  Diagnosis Date  . GERD (gastroesophageal reflux disease)   . STEMI (ST elevation myocardial infarction), 01/19/13 01/19/2013  . CAD (coronary artery disease) RCA non dominant vessel, LAD 40%, 80-90% 2nd diag EF 55%  01/19/2013  . Hyperlipidemia LDL goal < 70 01/19/2013  . Pulmonary nodules 01/19/2013  . Hypokalemia 01/20/2013  . Tobacco abuse 01/20/2013  . Metabolic syndrome, with mildly elevated HgBA1C 01/20/2013    Past Surgical History  Procedure Laterality Date  . Appendectomy      No Known Allergies  Current Outpatient Prescriptions  Medication Sig Dispense Refill  . acetaminophen (TYLENOL) 325 MG tablet Take 2 tablets (650 mg total) by mouth every 4 (four) hours as needed for headache or mild pain.      Marland Kitchen aspirin EC 81 MG EC tablet Take 1 tablet (81 mg total) by mouth daily.      . nitroGLYCERIN (NITROSTAT) 0.4 MG SL tablet Place 1 tablet (0.4 mg total) under the tongue every 5 (five) minutes x 3 doses as needed for chest pain.  25 tablet  4  . amLODipine (NORVASC) 2.5 MG tablet Take 1 tablet (2.5 mg total) by mouth daily.  30 tablet  6  . atorvastatin (LIPITOR) 80 MG tablet Take 1 tablet (80 mg total) by mouth daily at 6 PM.  30 tablet  6  . clopidogrel (PLAVIX) 75 MG tablet Take 1 tablet (75 mg total) by mouth daily with breakfast.  30 tablet  6  . lisinopril (PRINIVIL,ZESTRIL) 5 MG tablet Take  1 tablet (5 mg total) by mouth daily.  30 tablet  6  . pantoprazole (PROTONIX) 40 MG tablet Take 1 tablet (40 mg total) by mouth daily at 6 (six) AM.  30 tablet  6   No current facility-administered medications for this visit.    History   Social History  . Marital Status: Widowed    Spouse Name: N/A    Number of Children: N/A  . Years of Education: N/A   Occupational History  . farmer    Social History Main Topics  . Smoking status: Current Every Day Smoker --  1.00 packs/day for 75 years    Types: Cigarettes    Start date: 01/21/1963  . Smokeless tobacco: Never Used  . Alcohol Use: Not on file  . Drug Use: Not on file  . Sexual Activity: Not on file   Other Topics Concern  . Not on file   Social History Narrative  . No narrative on file   Social history is notable in that he is widowed. He does not have children. He is retired. He had smoked for 55 years but quit smoking 2 weeks ago at the time of his acute event. He does not drink alcohol.   History reviewed. No pertinent family history.  ROS General: Negative; No fevers, chills, or night sweats;  HEENT: Negative; No changes in vision or hearing, sinus congestion, difficulty swallowing Pulmonary: Negative; No cough, wheezing, shortness of breath, hemoptysis Cardiovascular: Negative; No chest pain, presyncope, syncope, palpatations GI: Negative; No nausea, vomiting, diarrhea, or abdominal pain GU: Negative; No dysuria, hematuria, or difficulty voiding Musculoskeletal: Occasional shoulder discomfort; no myalgias,  or weakness Hematologic/Oncology: Negative; no easy bruising, bleeding Endocrine: Negative; no heat/cold intolerance; no diabetes Neuro: Negative; no changes in balance, headaches Skin: Negative; No rashes or skin lesions Psychiatric: Negative; No behavioral problems, depression Sleep: Negative; No snoring, daytime sleepiness, hypersomnolence, bruxism, restless legs, hypnogognic hallucinations, no cataplexy Other comprehensive 14 point system review is negative.   PE BP 1130/58 Pulse 51  Ht 5\' 10"  (1.778 m)  Wt 171 lb 8 oz (77.792 kg)  BMI 24.61 kg/m2  General: Alert, oriented, no distress.  Appears older than stated age.  The Skin: normal turgor, no rashes HEENT: Normocephalic, atraumatic. Pupils round and reactive; sclera anicteric;no lid lag.  Nose without nasal septal hypertrophy Mouth/Parynx benign; Mallinpatti scale 3 Neck: No JVD, no carotid bruits with normal  carotid upstroke Lungs: Decreased breath sounds; no wheezing or rales Chest wall: no tenderness to palpitation Heart: RRR, s1 s2 normal 1/6 sem; no diastolic murmur. No rubs thrills or heaves. Abdomen: soft, nontender; no hepatosplenomehaly, BS+; abdominal aorta nontender and not dilated by palpation. Back: no CVA tenderness Pulses 2+ Extremities: no clubbing cyanosis or edema, Homan's sign negative  Neurologic: grossly nonfocal Psychologic: normal affect and mood.  ECG (independently read by me): Sinus bradycardia 51 bpm.  July 2015 ECG (independently read by me): Sinus bradycardia 51 beats per minute.  No ectopy.  QTc interval 414 ms.  Prior 05/06/2013 ECG (independently read by me): Sinus bradycardia 51 beats per minute. No ectopy. No significant ST changes.  Prior ECG from 02/04/2013 ECG: Sinus rhythm with T-wave inversion in leads 1 and avL and V2  LABS:  BMET    Component Value Date/Time   NA 139 01/21/2013 0510   K 3.8 01/21/2013 0510   CL 102 01/21/2013 0510   CO2 25 01/21/2013 0510   GLUCOSE 92 01/21/2013 0510   BUN 15 01/21/2013 0510  CREATININE 1.05 01/21/2013 0510   CALCIUM 9.2 01/21/2013 0510   GFRNONAA 70* 01/21/2013 0510   GFRAA 82* 01/21/2013 0510     Hepatic Function Panel     Component Value Date/Time   PROT 6.3 01/19/2013 1300   ALBUMIN 3.1* 01/19/2013 1300   AST 28 01/19/2013 1300   ALT 9 01/19/2013 1300   ALKPHOS 112 01/19/2013 1300   BILITOT 0.2* 01/19/2013 1300     CBC    Component Value Date/Time   WBC 8.6 01/21/2013 0510   RBC 4.01* 01/21/2013 0510   HGB 12.8* 01/21/2013 0510   HCT 37.9* 01/21/2013 0510   PLT 165 01/21/2013 0510   MCV 94.5 01/21/2013 0510   MCH 31.9 01/21/2013 0510   MCHC 33.8 01/21/2013 0510   RDW 13.4 01/21/2013 0510     BNP No results found for this basename: probnp    Lipid Panel     Component Value Date/Time   CHOL 193 01/19/2013 0820   TRIG 133 01/19/2013 0820   HDL 34* 01/19/2013 0820   CHOLHDL  5.7 01/19/2013 0820   VLDL 27 01/19/2013 0820   LDLCALC 132* 01/19/2013 0820     RADIOLOGY: No results found.    ASSESSMENT AND PLAN:  Mr. Farace is a 25 -year-old gentleman who presented with an acute coronary syndrome and underwent emergent cardiac catheterization on11/23/2014. He was found to have subtotal occlusion of his RCA and had both right to right and extensive left-to-right collaterals. He also had concomitant CAD as noted above with 80-90% stenosis in a small second diagonal branch of his LAD as well as 70 and 50% stenoses in his marginal vessels. He did have motion abnormality consistent with his diagonal stenosis.  He denies recurrent anginal symptoms.  I spent a long time with him discussing smoking cessation.  At times he has noticed some vague myalgias.  I recommended that he can reduce his atorvastatin to 40 mg.  His blood pressure today is controlled on lisinopril 5 mg and amlodipine 2.5 mg.  He's not having reflux and this is controlled with Protonix.  His ECG shows sinus bradycardia at 51 bpm.  He is not on any rate control medication.  I will see him in 6 months for reevaluation or sooner as needed.  Time spent: 25 minutes  Troy Sine, MD, [facility]  06/02/2013 4:54 PM

## 2014-03-17 ENCOUNTER — Telehealth: Payer: Self-pay | Admitting: Cardiovascular Disease

## 2014-03-17 MED ORDER — ATORVASTATIN CALCIUM 40 MG PO TABS: 40.0000 mg | ORAL_TABLET | Freq: Every day | ORAL | Status: DC

## 2014-03-17 NOTE — Telephone Encounter (Signed)
New script sent electronically to [facility].

## 2014-03-17 NOTE — Telephone Encounter (Signed)
Pt says he is now taking 40 mg of Atorvastatin,if so they need a new prescription for this please.

## 2014-05-12 ENCOUNTER — Telehealth: Payer: Self-pay | Admitting: Family Medicine

## 2014-05-12 NOTE — Telephone Encounter (Signed)
Appointment scheduled for 4/25 @10 :15 with Sabra Heck. Patient advised to bring insurance card, photo ID, medications and arrive 71minutes before appointment.

## 2014-06-22 ENCOUNTER — Encounter: Payer: Self-pay | Admitting: Family Medicine

## 2014-06-22 ENCOUNTER — Ambulatory Visit (INDEPENDENT_AMBULATORY_CARE_PROVIDER_SITE_OTHER): Payer: Commercial Managed Care - HMO | Admitting: Family Medicine

## 2014-06-22 VITALS — BP 115/67 | HR 53 | Temp 97.0°F | Ht 71.0 in | Wt 164.0 lb

## 2014-06-22 DIAGNOSIS — I2510 Atherosclerotic heart disease of native coronary artery without angina pectoris: Secondary | ICD-10-CM

## 2014-06-22 DIAGNOSIS — E785 Hyperlipidemia, unspecified: Secondary | ICD-10-CM | POA: Diagnosis not present

## 2014-06-22 DIAGNOSIS — I2583 Coronary atherosclerosis due to lipid rich plaque: Secondary | ICD-10-CM

## 2014-06-22 MED ORDER — LISINOPRIL 10 MG PO TABS: 10.0000 mg | ORAL_TABLET | Freq: Every day | ORAL | Status: DC

## 2014-06-22 NOTE — Progress Notes (Deleted)
   Subjective:    [name], male    DOB: Dec 24, 1945, 69 y.o.   MRN: [id number]  HPI    Review of Systems     Objective:   Physical Exam        Assessment & Plan:

## 2014-06-22 NOTE — Progress Notes (Signed)
Subjective:    [name], male    DOB: [birth year], 69 y.o.   MRN: [id number]  HPI first visit here for this 69 year old gentleman. In November 2014 he had a cardiac event and was transferred to [facility]. Subsequent catheterization showed some coronary disease and nondominant vessel 50% in the LAD. He has not had any chest pain since then it was decided to treat him medically and he was placed on Plavix aspirin atorvastatin and 2 antihypertensives. He is not sure that he needs all this medicine 1 from no medicines T[place] 6. He does continue to smoke even though he has been told he should stop. He continues to work hard, again with no further chest pain  Chief Complaint  Patient presents with  . Establish Care    New Patient    Patient Active Problem List   Diagnosis Date Noted  . Hypokalemia-replaced 01/20/2013  . Tobacco abuse 01/20/2013  . Metabolic syndrome, with mildly elevated HgBA1C 01/20/2013  . Non-STEMI (non-ST elevated myocardial infarction), involving RCA/ LAD-diag  01/19/2013  . CAD (coronary artery disease) RCA non dominant vessel, LAD 50-60%, 80-90% 2nd diag EF 55%  01/19/2013  . Hyperlipidemia with target LDL less than 70 01/19/2013  . Pulmonary nodules 01/19/2013   Outpatient Encounter Prescriptions as of 06/22/2014  Medication Sig  . amLODipine (NORVASC) 2.5 MG tablet TAKE 1 TABLET BY MOUTH DAILY.  Marland Kitchen aspirin EC 81 MG EC tablet Take 1 tablet (81 mg total) by mouth daily.  Marland Kitchen atorvastatin (LIPITOR) 40 MG tablet Take 1 tablet (40 mg total) by mouth daily.  Marland Kitchen lisinopril (PRINIVIL,ZESTRIL) 5 MG tablet TAKE 1 TABLET BY MOUTH DAILY  . nitroGLYCERIN (NITROSTAT) 0.4 MG SL tablet Place 1 tablet (0.4 mg total) under the tongue every 5 (five) minutes x 3 doses as needed for chest pain.  . pantoprazole (PROTONIX) 40 MG tablet TAKE 1 TABLET BY MOUTH DAILY AT 6:00 A.M.  . clopidogrel (PLAVIX) 75 MG tablet Take 1 tablet by mouth daily.  . [DISCONTINUED] acetaminophen  (TYLENOL) 325 MG tablet Take 2 tablets (650 mg total) by mouth every 4 (four) hours as needed for headache or mild pain.  . [DISCONTINUED] clopidogrel (PLAVIX) 75 MG tablet TAKE 1 TABLET BY MOUTH DAILY WITH BREAKFAST     Review of Systems  Constitutional: Negative.   HENT: Negative.   Eyes: Negative.   Respiratory: Negative.  Negative for shortness of breath.   Cardiovascular: Negative.  Negative for chest pain and leg swelling.  Gastrointestinal: Negative.   Genitourinary: Negative.   Musculoskeletal: Negative.   Skin: Negative.   Neurological: Negative.   Psychiatric/Behavioral: Negative.   All other systems reviewed and are negative.      Objective:   Physical Exam  Constitutional: He is oriented to person, place, and time. He appears well-developed and well-nourished.  HENT:  Head: Normocephalic.  Right Ear: External ear normal.  Left Ear: External ear normal.  Nose: Nose normal.  Mouth/Throat: Oropharynx is clear and moist.  Eyes: Conjunctivae and EOM are normal. Pupils are equal, round, and reactive to light.  Neck: Normal range of motion. Neck supple.  Cardiovascular: Normal rate, regular rhythm, normal heart sounds and intact distal pulses.   Pulmonary/Chest: Effort normal and breath sounds normal.  Abdominal: Soft. Bowel sounds are normal.  Musculoskeletal: Normal range of motion.  Neurological: He is alert and oriented to person, place, and time.  Skin: Skin is warm and dry.  Psychiatric: He has a normal mood and affect.  His behavior is normal. Judgment and thought content normal.    BP 115/67 mmHg  Pulse 53  Temp(Src) 97 F (36.1 C) (Oral)  Ht 5\' 11"  (1.803 m)  Wt 164 lb (74.39 kg)  BMI 22.88 kg/m2       Assessment & Plan:  1. Hyperlipidemia with target LDL less than 70 Continues to take statin. Side effects seem to be improved since cutting the dose from 80-40 and I have suggested that he may go to every other day for persistent symptoms. He is  scheduled to have lipids repeated before his cardiology visit in July so rather than repeat today will let that service the next testing  2. Coronary artery disease due to lipid rich plaque No further chest pain. An attempt to streamline his medicine I stopped amlodipine and increased lisinopril to 10 mg a day, more average dose. - Ambulatory referral to Cardiology  Wardell Honour MD

## 2014-06-25 ENCOUNTER — Telehealth: Payer: Self-pay | Admitting: Cardiovascular Disease

## 2014-06-25 NOTE — Telephone Encounter (Signed)
Closed encounter °

## 2014-07-17 ENCOUNTER — Encounter: Payer: Self-pay | Admitting: Physician Assistant

## 2014-07-17 ENCOUNTER — Ambulatory Visit (INDEPENDENT_AMBULATORY_CARE_PROVIDER_SITE_OTHER): Payer: Commercial Managed Care - HMO | Admitting: Physician Assistant

## 2014-07-17 VITALS — BP 132/70 | HR 72 | Ht 71.0 in | Wt 168.3 lb

## 2014-07-17 DIAGNOSIS — I2583 Coronary atherosclerosis due to lipid rich plaque: Secondary | ICD-10-CM

## 2014-07-17 DIAGNOSIS — Z720 Tobacco use: Secondary | ICD-10-CM | POA: Diagnosis not present

## 2014-07-17 DIAGNOSIS — E8881 Metabolic syndrome: Secondary | ICD-10-CM

## 2014-07-17 DIAGNOSIS — Z79899 Other long term (current) drug therapy: Secondary | ICD-10-CM | POA: Diagnosis not present

## 2014-07-17 DIAGNOSIS — E785 Hyperlipidemia, unspecified: Secondary | ICD-10-CM

## 2014-07-17 DIAGNOSIS — I2510 Atherosclerotic heart disease of native coronary artery without angina pectoris: Secondary | ICD-10-CM | POA: Diagnosis not present

## 2014-07-17 NOTE — Assessment & Plan Note (Signed)
Continue statin. He will have labs prior to next office visit which she was supposed to have last January.

## 2014-07-17 NOTE — Assessment & Plan Note (Addendum)
No complaints of angina continue aspirin Plavix.  Dr. Sabra Heck has recently changed his lisinopril to 10 mg and discontinued his 2-1/2 of amlodipine.  I think this is fine. Patient was reluctant to make the change until seen here in the office.

## 2014-07-17 NOTE — Assessment & Plan Note (Signed)
Tobacco cessation discussed again

## 2014-07-17 NOTE — Progress Notes (Signed)
[name], male   DOB: 03-18-1945, 69 y.o.   MRN: [id number]    Date:  07/17/2014   ID:  Steven Drake, DOB 1945-10-29, MRN [id number]  PCP:  Wardell Honour, MD  Primary Cardiologist:  Claiborne Billings  Chief Complaint  Patient presents with  . Follow-up    referred by Dr.Miller no chest pain or pain or swelling of legs. Question about medication     History of Present Illness: Steven Drake is a 69 y.o. male  [name] was transferred acutely from [facility] on 01/19/2013 with possible ST segment elevation myocardial infarction. Initial troponin was negative but a subsequent troponin was positive and he was noted to have anterolateral ST changes. Upon arrival to [facility] he had T-wave inversion in 1 and L. V4 through V6. Cardiac catheterization done emergently by me showed low normal LV function with moderately severe focal hypocontractility of the mid anterolateral wall and minimal hypocontractility of the focal inferolateral wall. Ejection fraction was approximately 50-55%. He was found to have 40% smooth ostial narrowing of the LAD, a dominant left circumflex vessel with 70% ostial narrowing in the first marginal branch and diffuse 50% narrowing in the second marginal branch. His RCA was nondominant and had a 95% proximal stenosis and he had a subtotal/total mid occlusion with moderate right to right bridging collaterals and extensive left to right collaterals. Medical therapy was recommended and smoking cessation was felt to be imperative.    He continues to be on dual antiplatelet therapy and denies bleeding. He also is on lisinopril 5 mg, amlodipine 2.5 mg and atorvastatin 80 mg.Dr. Sabra Heck recently switched him to lisinopril 10 and DC it is amlodipine. Patient reluctant to make the change until seen here at the office. He has history of GERD for which she is on Protonix.   Patient presents for evaluation. He has no specific complaints. Reports some joint pain  bilateral shoulders when he is working. 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  Continues to smoke.  The patient currently denies nausea, vomiting, fever, chest pain, shortness of breath, orthopnea, dizziness, PND, cough, congestion, abdominal pain, hematochezia, melena, lower extremity edema, claudication.  Wt Readings from Last 3 Encounters:  07/17/14 168 lb 4.8 oz (76.34 kg)  06/22/14 164 lb (74.39 kg)  03/10/14 163 lb 4.8 oz (74.072 kg)     Past Medical History  Diagnosis Date  . GERD (gastroesophageal reflux disease)   . STEMI (ST elevation myocardial infarction), 01/19/13 01/19/2013  . CAD (coronary artery disease) RCA non dominant vessel, LAD 40%, 80-90% 2nd diag EF 55%  01/19/2013  . Hyperlipidemia LDL goal < 70 01/19/2013  . Pulmonary nodules 01/19/2013  . Hypokalemia 01/20/2013  . Tobacco abuse 01/20/2013  . Metabolic syndrome, with mildly elevated HgBA1C 01/20/2013    Current Outpatient Prescriptions  Medication Sig Dispense Refill  . aspirin EC 81 MG EC tablet Take 1 tablet (81 mg total) by mouth daily.    Marland Kitchen atorvastatin (LIPITOR) 40 MG tablet Take 1 tablet (40 mg total) by mouth daily. 90 tablet 3  . clopidogrel (PLAVIX) 75 MG tablet Take 1 tablet by mouth daily.    Marland Kitchen lisinopril (PRINIVIL,ZESTRIL) 10 MG tablet Take 1 tablet (10 mg total) by mouth daily. 30 tablet 2  . nitroGLYCERIN (NITROSTAT) 0.4 MG SL tablet Place 1 tablet (0.4 mg total) under the tongue every 5 (five) minutes x 3 doses as needed for chest pain. 25 tablet 4  .  pantoprazole (PROTONIX) 40 MG tablet TAKE 1 TABLET BY MOUTH DAILY AT 6:00 A.M. 30 tablet 6   No current facility-administered medications for this visit.    Allergies:   No Known Allergies  Social History:  The patient  reports that he has been smoking Cigarettes.  He started smoking about 51 years ago. He has a 58 pack-year smoking history. He has never used smokeless tobacco. He reports that he does not drink alcohol or use illicit drugs.    Family history:   Family History  Problem Relation Age of Onset  . Cancer Father     colon  . Cancer Sister     colon    ROS:  Please see the history of present illness.  All other systems reviewed and negative.   PHYSICAL EXAM: VS:  BP 132/70 mmHg  Pulse 72  Ht 5\' 11"  (1.803 m)  Wt 168 lb 4.8 oz (76.34 kg)  BMI 23.48 kg/m2 Well nourished, well developed, in no acute distress HEENT: Pupils are equal round react to light accommodation extraocular movements are intact.  Neck: no JVDNo cervical lymphadenopathy. Cardiac: Regular rate and rhythm without murmurs rubs or gallops. Lungs:  clear to auscultation bilaterally, no wheezing, rhonchi or rales Abd: soft, nontender, positive bowel sounds all quadrants, no hepatosplenomegaly Ext: no lower extremity edema.  2+ radial and dorsalis pedis pulses. Skin: warm and dry Neuro:  Grossly normal     ASSESSMENT AND PLAN:  Problem List Items Addressed This Visit    Tobacco abuse (Chronic)    Tobacco cessation discussed again      Hyperlipidemia with target LDL less than 70 - Primary    Continue statin. He will have labs prior to next office visit which she was supposed to have last January.      CAD (coronary artery disease) RCA non dominant vessel, LAD 50-60%, 80-90% 2nd diag EF 55%     No complaints of angina continue aspirin Plavix.  Dr. Sabra Heck has recently changed his lisinopril to 10 mg and discontinued his 2-1/2 of amlodipine.  I think this is fine. Patient was reluctant to make the change until seen here in the office.       Other Visit Diagnoses    Polypharmacy

## 2014-07-17 NOTE — Patient Instructions (Signed)
Your physician recommends that you schedule a follow-up appointment in July- AUG 2016 WITH DR Claiborne Billings.  YOU MAY CONTINUE WITH 10 MG OF LISINOPRIL  --ONE TABLET DAILY  STOP TAKING AMLODIPINE.  PLEASE DO LABS BEFORE NEXT APPOINTMENT---CBC, LIPID, TSH,CMP

## 2014-07-24 ENCOUNTER — Telehealth: Payer: Self-pay | Admitting: Cardiovascular Disease

## 2014-07-24 NOTE — Telephone Encounter (Signed)
Returned call to patient's wife no answer.LMTC. 

## 2014-07-24 NOTE — Telephone Encounter (Signed)
Pt's wife called in stating that his PCP increased his Lisinopril medication to 10mg  and now he is experiencing a lot of lightheadness. She would like to know if the pt's medication may need to be adjusted again. Please call  Thanks

## 2014-07-24 NOTE — Telephone Encounter (Signed)
Received a call from patient's wife she stated PCP changed Lisinopril to 10 mg daily.Stated he is dizzy.Stated she does not know why he changed Lisinopril.Advised to call PCP.

## 2014-08-21 ENCOUNTER — Encounter: Payer: Self-pay | Admitting: *Deleted

## 2014-08-24 ENCOUNTER — Other Ambulatory Visit: Payer: Self-pay | Admitting: *Deleted

## 2014-08-24 ENCOUNTER — Encounter: Payer: Self-pay | Admitting: *Deleted

## 2014-08-24 DIAGNOSIS — I2583 Coronary atherosclerosis due to lipid rich plaque: Secondary | ICD-10-CM | POA: Diagnosis not present

## 2014-08-24 DIAGNOSIS — I2510 Atherosclerotic heart disease of native coronary artery without angina pectoris: Secondary | ICD-10-CM

## 2014-08-24 DIAGNOSIS — Z720 Tobacco use: Secondary | ICD-10-CM | POA: Diagnosis not present

## 2014-08-24 DIAGNOSIS — Z79899 Other long term (current) drug therapy: Secondary | ICD-10-CM

## 2014-08-24 DIAGNOSIS — E8881 Metabolic syndrome: Secondary | ICD-10-CM | POA: Diagnosis not present

## 2014-08-24 DIAGNOSIS — E785 Hyperlipidemia, unspecified: Secondary | ICD-10-CM

## 2014-08-24 LAB — LIPID PANEL
Cholesterol: 140 mg/dL (ref 0–200)
HDL: 25 mg/dL — ABNORMAL LOW (ref >=40)
LDL CALC: 75 mg/dL (ref 0–99)
Total CHOL/HDL Ratio: 5.6 Ratio
Triglycerides: 202 mg/dL — ABNORMAL HIGH (ref <150)
VLDL: 40 mg/dL (ref 0–40)

## 2014-08-24 LAB — COMPREHENSIVE METABOLIC PANEL
ALT: 9 U/L (ref 0–53)
AST: 14 U/L (ref 0–37)
Albumin: 3.8 g/dL (ref 3.5–5.2)
Alkaline Phosphatase: 125 U/L — ABNORMAL HIGH (ref 39–117)
BILIRUBIN TOTAL: 0.5 mg/dL (ref 0.2–1.2)
BUN: 15 mg/dL (ref 6–23)
CO2: 27 mEq/L (ref 19–32)
CREATININE: 1.53 mg/dL — AB (ref 0.50–1.35)
Calcium: 9.2 mg/dL (ref 8.4–10.5)
Chloride: 104 mEq/L (ref 96–112)
Glucose, Bld: 109 mg/dL — ABNORMAL HIGH (ref 70–99)
Potassium: 4.6 mEq/L (ref 3.5–5.3)
Sodium: 140 mEq/L (ref 135–145)
TOTAL PROTEIN: 6.5 g/dL (ref 6.0–8.3)

## 2014-08-24 LAB — TSH: TSH: 1.544 u[IU]/mL (ref 0.350–4.500)

## 2014-08-25 LAB — CBC
HCT: 38.9 % — ABNORMAL LOW (ref 39.0–52.0)
Hemoglobin: 13.2 g/dL (ref 13.0–17.0)
MCH: 31.2 pg (ref 26.0–34.0)
MCHC: 33.9 g/dL (ref 30.0–36.0)
MCV: 92 fL (ref 78.0–100.0)
MPV: 9.9 fL (ref 8.6–12.4)
Platelets: 198 10*3/uL (ref 150–400)
RBC: 4.23 MIL/uL (ref 4.22–5.81)
RDW: 13.9 % (ref 11.5–15.5)
WBC: 11.2 10*3/uL — ABNORMAL HIGH (ref 4.0–10.5)

## 2014-08-27 ENCOUNTER — Encounter: Payer: Self-pay | Admitting: Cardiovascular Disease

## 2014-08-27 ENCOUNTER — Ambulatory Visit (INDEPENDENT_AMBULATORY_CARE_PROVIDER_SITE_OTHER): Payer: Commercial Managed Care - HMO | Admitting: Cardiovascular Disease

## 2014-08-27 VITALS — BP 110/60 | HR 53 | Ht 71.0 in | Wt 165.0 lb

## 2014-08-27 DIAGNOSIS — E785 Hyperlipidemia, unspecified: Secondary | ICD-10-CM

## 2014-08-27 DIAGNOSIS — I2510 Atherosclerotic heart disease of native coronary artery without angina pectoris: Secondary | ICD-10-CM

## 2014-08-27 DIAGNOSIS — Z720 Tobacco use: Secondary | ICD-10-CM

## 2014-08-27 DIAGNOSIS — I2583 Coronary atherosclerosis due to lipid rich plaque: Principal | ICD-10-CM

## 2014-08-27 MED ORDER — LISINOPRIL 5 MG PO TABS: 5.0000 mg | ORAL_TABLET | Freq: Every day | ORAL | Status: DC

## 2014-08-27 NOTE — Progress Notes (Signed)
[name], male   DOB: 07/03/1945, 69 y.o.   MRN: [id number]   1 [name] is a 68 y.o. male who presents for 6 month followup cardiology evaluation.  Steven Drake has established CAD and was transferred acutely from [facility] on 01/19/2013 with possible ST segment elevation myocardial infarction.  Initial troponin was negative but a subsequent troponin was positive and he was noted to have anterolateral ST changes. Upon arrival to [place] he had T-wave inversion in 1 and aVL, V4 through V6. Cardiac catheterization done emergently by me showed low normal LV function with moderately severe focal hypocontractility of the mid anterolateral wall and minimal hypocontractility of the focal inferolateral wall. Ejection fraction was approximately 50-55%. He was found to have 40% smooth ostial narrowing of the LAD, a dominant left circumflex vessel with 70% ostial narrowing in the first marginal branch and diffuse 50% narrowing in the second marginal branch. His RCA was nondominant and had a 95% proximal stenosis and he had a subtotal/total mid occlusion with moderate right to right bridging collaterals and extensive left to right collaterals. Medical therapy was recommended and smoking cessation was felt to be imperative. Since hospital discharge, he denies recurrent episodes of chest pain. He does note some left chronic shoulder discomfort. He denies shortness of breath. He denies palpitations.  He has a long-standing history of tobacco and continues to smoke.  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  He continues to be on dual antiplatelet therapy and denies bleeding.  He also is on lisinopril 5 mg, amlodipine 2.5 mg and atorvastatin 80 mg.  He has history of GERD for which she is on Protonix.  He presents for evaluation.  I have reviewed recent laboratory from 08/24/2014.  He had been working outside  in significant heat.  Hemoglobin was 13.2 medical 38.9.  BUN 15, creatinine 1.53, which had increased from one year ago.  He is now on a reduced dose of lisinopril at 5 mg and Zetia 10 mg.  Cholesterol was 140, triglycerides are elevated at 202, HDL was low at 25, and LDL 75.  TSH was normal.  He denies chest pain.  He denies change in any of his activity level.  He still smoking at least one pack per day.  Past Medical History  Diagnosis Date  . GERD (gastroesophageal reflux disease)   . STEMI (ST elevation myocardial infarction), 01/19/13 01/19/2013  . CAD (coronary artery disease) RCA non dominant vessel, LAD 40%, 80-90% 2nd diag EF 55%  01/19/2013  . Hyperlipidemia LDL goal < 70 01/19/2013  . Pulmonary nodules 01/19/2013  . Hypokalemia 01/20/2013  . Tobacco abuse 01/20/2013  . Metabolic syndrome, with mildly elevated HgBA1C 01/20/2013    Past Surgical History  Procedure Laterality Date  . Appendectomy      No Known Allergies  Current Outpatient Prescriptions  Medication Sig Dispense Refill  . acetaminophen (TYLENOL) 325 MG tablet Take 2 tablets (650 mg total) by mouth every 4 (four) hours as needed for headache or mild pain.      . aspirin EC 81 MG EC tablet Take 1 tablet (81 mg total) by mouth daily.      . nitroGLYCERIN (NITROSTAT) 0.4 MG SL tablet Place 1 tablet (0.4 mg total) under the tongue every 5 (five) minutes x 3 doses as needed for chest pain.  25 tablet  4  . amLODipine (NORVASC) 2.5 MG tablet   Take 1 tablet (2.5 mg total) by mouth daily.  30 tablet  6  . atorvastatin (LIPITOR) 80 MG tablet Take 1 tablet (80 mg total) by mouth daily at 6 PM.  30 tablet  6  . clopidogrel (PLAVIX) 75 MG tablet Take 1 tablet (75 mg total) by mouth daily with breakfast.  30 tablet  6  . lisinopril (PRINIVIL,ZESTRIL) 5 MG tablet Take 1 tablet (5 mg total) by mouth daily.  30 tablet  6  . pantoprazole (PROTONIX) 40 MG tablet Take 1 tablet (40 mg total) by mouth daily at 6 (six) AM.  30 tablet   6   No current facility-administered medications for this visit.    History   Social History  . Marital Status: Widowed    Spouse Name: N/A    Number of Children: N/A  . Years of Education: N/A   Occupational History  . farmer    Social History Main Topics  . Smoking status: Current Every Day Smoker -- 1.00 packs/day for 75 years    Types: Cigarettes    Start date: 01/21/1963  . Smokeless tobacco: Never Used  . Alcohol Use: Not on file  . Drug Use: Not on file  . Sexual Activity: Not on file   Other Topics Concern  . Not on file   Social History Narrative  . No narrative on file   Social history is notable in that he is widowed. He does not have children. He is retired. He had smoked for 55 years but quit smoking 2 weeks ago at the time of his acute event. He does not drink alcohol.   History reviewed. No pertinent family history.  ROS General: Negative; No fevers, chills, or night sweats;  HEENT: Negative; No changes in vision or hearing, sinus congestion, difficulty swallowing Pulmonary: Negative; No cough, wheezing, shortness of breath, hemoptysis Cardiovascular: Negative; No chest pain, presyncope, syncope, palpatations GI: Negative; No nausea, vomiting, diarrhea, or abdominal pain GU: Negative; No dysuria, hematuria, or difficulty voiding Musculoskeletal: Occasional shoulder discomfort; no myalgias,  or weakness Hematologic/Oncology: Negative; no easy bruising, bleeding Endocrine: Negative; no heat/cold intolerance; no diabetes Neuro: Negative; no changes in balance, headaches Skin: Negative; No rashes or skin lesions Psychiatric: Negative; No behavioral problems, depression Sleep: Negative; No snoring, daytime sleepiness, hypersomnolence, bruxism, restless legs, hypnogognic hallucinations, no cataplexy Other comprehensive 14 point system review is negative.   PE BP 114/68/58 Pulse 53  Ht 5' 10" (1.778 m)  Wt 165 lb 8 oz (77.792 kg)  BMI 23.02 kg/m2    General: Alert, oriented, no distress.  Appears older than stated age.   Skin: Significant facial wrinkles HEENT: Normocephalic, atraumatic. Pupils round and reactive; sclera anicteric;no lid lag.  Nose without nasal septal hypertrophy Mouth/Parynx benign; Mallinpatti scale 3 Neck: No JVD, no carotid bruits with normal carotid upstroke Lungs: Decreased breath sounds; no wheezing or rales Chest wall: no tenderness to palpitation Heart: RRR, s1 s2 normal 1/6 sem; no diastolic murmur. No rubs thrills or heaves. Abdomen: soft, nontender; no hepatosplenomehaly, BS+; abdominal aorta nontender and not dilated by palpation. Back: no CVA tenderness Pulses 2+ Extremities: no clubbing cyanosis or edema, Homan's sign negative  Neurologic: grossly nonfocal Psychologic: normal affect and mood.  ECG (independently read by me): Sinus bradycardia 53 bpm.  ECG (independently read by me): Sinus bradycardia 51 bpm.  July 2015 ECG (independently read by me): Sinus bradycardia 51 beats per minute.  No ectopy.  QTc interval 414 ms.  Prior 05/06/2013 ECG (independently read   by me): Sinus bradycardia 51 beats per minute. No ectopy. No significant ST changes.  Prior ECG from 02/04/2013 ECG: Sinus rhythm with T-wave inversion in leads 1 and avL and V2  LABS: BMP Latest Ref Rng 08/24/2014 01/21/2013 01/20/2013  Glucose 70 - 99 mg/dL 109(H) 92 117(H)  BUN 6 - 23 mg/dL _0 Creatinine 0.50 - 1.35 mg/dL 1.53(H) 1.05 0.88  Sodium 135 - 145 mEq/L 140 139 135  Potassium 3.5 - 5.3 mEq/L 4.6 3.8 3.3(L)  Chloride 96 - 112 mEq/L 104 102 101  CO2 19 - 32 mEq/L _1 Calcium 8.4 - 10.5 mg/dL 9.2 9.2 8.5   Hepatic Function Latest Ref Rng 08/24/2014 01/19/2013  Total Protein 6.0 - 8.3 g/dL 6.5 6.3  Albumin 3.5 - 5.2 g/dL 3.8 3.1(L)  AST 0 - 37 U/L 14 28  ALT 0 - 53 U/L 9 9  Alk Phosphatase 39 - 117 U/L 125(H) 112  Total Bilirubin 0.2 - 1.2 mg/dL 0.5 0.2(L)   CBC Latest Ref Rng 08/24/2014 01/21/2013  01/20/2013  WBC 4.0 - 10.5 K/uL 11.2(H) 8.6 8.9  Hemoglobin 13.0 - 17.0 g/dL 13.2 12.8(L) 12.5(L)  Hematocrit 39.0 - 52.0 % 38.9(L) 37.9(L) 36.5(L)  Platelets 150 - 400 K/uL 198 165 169   Lab Results  Component Value Date   MCV 92.0 08/24/2014   MCV 94.5 01/21/2013   MCV 93.4 01/20/2013   Lab Results  Component Value Date   TSH 1.544 08/24/2014   Lab Results  Component Value Date   HGBA1C 6.2* 01/19/2013   Lipid Panel     Component Value Date/Time   CHOL 140 08/24/2014 0810   TRIG 202* 08/24/2014 0810   HDL 25* 08/24/2014 0810   CHOLHDL 5.6 08/24/2014 0810   VLDL 40 08/24/2014 0810   LDLCALC 75 08/24/2014 0810     RADIOLOGY: No results found.    ASSESSMENT AND PLAN: Steven Drake is a 33 -year-old gentleman who presented with an acute coronary syndrome and underwent emergent cardiac catheterization on11/23/2014. He was found to have subtotal occlusion of his RCA and had both right to right and extensive left-to-right collaterals. He also had concomitant CAD as noted above with 80-90% stenosis in a small second diagonal branch of his LAD as well as 70 and 50% stenoses in his marginal vessels. He had a mild wall motion abnormality consistent with his diagonal stenosis.  He denies recurrent anginal symptoms.  His blood pressure today remains stable.  He denies any significant exertional shortness of breath.  I spent again long time with him discussing the importance of complete smoking cessation.  I'm doubtful that he will do this.  I reviewed his laboratory in detail.  His creatinine had risen.  He is on lisinopril at 5 mg.  A subsequent bmet will be obtained and if creatinine further increases his ace inhibition may need to be further reduced or discontinued.  His lipids reveal an excellent LDL, which has improved from 1:30 to 1 year ago, but triglycerides remain elevated at 202.  We discussed changing diet, reducing carbohydrates and sweets.  I will see him in 6 months for  reevaluation or sooner from his arise. Time spent: 25 minutes  Troy Sine, MD, [facility]  7:05 PM  08/27/2014

## 2014-08-27 NOTE — Patient Instructions (Signed)
Your physician wants you to follow-up in: 6 months or sooner if needed. You will receive a reminder letter in the mail two months in advance. If you don't receive a letter, please call our office to schedule the follow-up appointment. 

## 2014-08-31 ENCOUNTER — Other Ambulatory Visit: Payer: Self-pay | Admitting: Family Medicine

## 2014-09-02 ENCOUNTER — Other Ambulatory Visit: Payer: Self-pay | Admitting: *Deleted

## 2014-09-02 MED ORDER — LISINOPRIL 5 MG PO TABS: 5.0000 mg | ORAL_TABLET | Freq: Every day | ORAL | Status: DC

## 2014-09-28 ENCOUNTER — Other Ambulatory Visit: Payer: Self-pay | Admitting: Cardiovascular Disease

## 2014-09-28 NOTE — Telephone Encounter (Signed)
Rx(s) sent to pharmacy electronically.  

## 2014-10-01 ENCOUNTER — Ambulatory Visit: Payer: Commercial Managed Care - HMO | Admitting: Cardiovascular Disease

## 2014-12-23 ENCOUNTER — Ambulatory Visit (INDEPENDENT_AMBULATORY_CARE_PROVIDER_SITE_OTHER): Payer: Commercial Managed Care - HMO | Admitting: Family Medicine

## 2014-12-23 ENCOUNTER — Encounter: Payer: Self-pay | Admitting: Family Medicine

## 2014-12-23 VITALS — BP 142/70 | HR 57 | Temp 97.4°F | Ht 71.0 in | Wt 167.8 lb

## 2014-12-23 DIAGNOSIS — I2510 Atherosclerotic heart disease of native coronary artery without angina pectoris: Secondary | ICD-10-CM

## 2014-12-23 DIAGNOSIS — E785 Hyperlipidemia, unspecified: Secondary | ICD-10-CM | POA: Diagnosis not present

## 2014-12-23 NOTE — Progress Notes (Signed)
   Subjective:    [name], male    DOB: 21-Apr-1945, 69 y.o.   MRN: [id number]  HPI 69 year old male who is here to follow-up lipids, hypertension, and coronary disease. He is followed by cardiology at 6 month intervals. He has not had any further chest pain since he had MI. He does have some chest and shoulder soreness that he relates to work. He continues to smoke. At his last visit, I stopped amlodipine to try to reduce his met pill burden and increased lisinopril from 5-10 mg. He says that he got dizzy taking 10 mg of lisinopril. He is now taking 5 mg and blood pressure is not as good as it was.  Patient Active Problem List   Diagnosis Date Noted  . CAD in native artery 08/27/2014  . Hypokalemia-replaced 01/20/2013  . Tobacco abuse 01/20/2013  . Metabolic syndrome, with mildly elevated HgBA1C 01/20/2013  . Non-STEMI (non-ST elevated myocardial infarction), involving RCA/ LAD-diag  01/19/2013  . CAD (coronary artery disease) RCA non dominant vessel, LAD 50-60%, 80-90% 2nd diag EF 55%  01/19/2013  . Hyperlipidemia with target LDL less than 70 01/19/2013  . Pulmonary nodules 01/19/2013   Outpatient Encounter Prescriptions as of 12/23/2014  Medication Sig  . aspirin EC 81 MG EC tablet Take 1 tablet (81 mg total) by mouth daily.  Marland Kitchen atorvastatin (LIPITOR) 40 MG tablet Take 1 tablet (40 mg total) by mouth daily.  . clopidogrel (PLAVIX) 75 MG tablet TAKE 1 TABLET BY MOUTH DAILY WITH BREAKFAST  . lisinopril (PRINIVIL,ZESTRIL) 5 MG tablet Take 1 tablet (5 mg total) by mouth daily.  . nitroGLYCERIN (NITROSTAT) 0.4 MG SL tablet Place 1 tablet (0.4 mg total) under the tongue every 5 (five) minutes x 3 doses as needed for chest pain.  . pantoprazole (PROTONIX) 40 MG tablet TAKE 1 TABLET BY MOUTH DAILY AT 6:00 A.M.  . [DISCONTINUED] clopidogrel (PLAVIX) 75 MG tablet Take 1 tablet by mouth daily.   No facility-administered encounter medications on file as of 12/23/2014.      Review  of Systems  Constitutional: Negative.   Respiratory: Negative.   Cardiovascular: Negative.   Neurological: Negative.   Psychiatric/Behavioral: Negative.        Objective:   Physical Exam  Constitutional: He is oriented to person, place, and time. He appears well-developed and well-nourished.  Cardiovascular: Normal rate and regular rhythm.   Pulmonary/Chest: Effort normal and breath sounds normal.  Neurological: He is oriented to person, place, and time.          Assessment & Plan:   1. CAD in native artery No chest pain continue with Plavix and aspirin as well as statin. I suggested he increase lisinopril and take it at nighttime to see if it is better tolerated  2. Hyperlipidemia with target LDL less than [place] MDLipids were checked by cardiologist at visit LDL is almost at goal at 39. Atorvastatin dosages 40 mg

## 2015-02-23 ENCOUNTER — Telehealth: Payer: Self-pay | Admitting: Family Medicine

## 2015-02-26 NOTE — Telephone Encounter (Signed)
Referral placed and number attached to appointment

## 2015-03-05 ENCOUNTER — Ambulatory Visit (INDEPENDENT_AMBULATORY_CARE_PROVIDER_SITE_OTHER): Payer: Commercial Managed Care - HMO | Admitting: Cardiovascular Disease

## 2015-03-05 ENCOUNTER — Encounter: Payer: Self-pay | Admitting: Cardiovascular Disease

## 2015-03-05 VITALS — BP 130/64 | HR 54 | Ht 71.0 in | Wt 176.1 lb

## 2015-03-05 DIAGNOSIS — I2510 Atherosclerotic heart disease of native coronary artery without angina pectoris: Secondary | ICD-10-CM

## 2015-03-05 DIAGNOSIS — E785 Hyperlipidemia, unspecified: Secondary | ICD-10-CM | POA: Diagnosis not present

## 2015-03-05 DIAGNOSIS — M79606 Pain in leg, unspecified: Secondary | ICD-10-CM | POA: Diagnosis not present

## 2015-03-05 DIAGNOSIS — Z79899 Other long term (current) drug therapy: Secondary | ICD-10-CM

## 2015-03-05 DIAGNOSIS — Z720 Tobacco use: Secondary | ICD-10-CM

## 2015-03-05 DIAGNOSIS — I25810 Atherosclerosis of coronary artery bypass graft(s) without angina pectoris: Secondary | ICD-10-CM

## 2015-03-05 DIAGNOSIS — M25559 Pain in unspecified hip: Secondary | ICD-10-CM | POA: Insufficient documentation

## 2015-03-05 NOTE — Patient Instructions (Addendum)
Your physician has requested that you have a lower extremity arterial exercise duplex. During this test, exercise and ultrasound are used to evaluate arterial blood flow in the legs. Allow one hour for this exam. There are no restrictions or special instructions.  Your physician recommends that you return for lab fasting work .  Your physician wants you to follow-up in: 4 months or sooner if needed. You will receive a reminder letter in the mail two months in advance. If you don't receive a letter, please call our office to schedule the follow-up appointment.

## 2015-03-05 NOTE — Progress Notes (Signed)
[name], male   DOB: 1945/05/05, 70 y.o.   MRN: [id number]     [name] is a 70 y.o. male who presents for 6 month followup cardiology evaluation.  Mr. Valbuena has established CAD and was transferred acutely from [facility] on 01/19/2013 with possible ST segment elevation myocardial infarction.  Initial troponin was negative but a subsequent troponin was positive and he was noted to have anterolateral ST changes. Upon arrival to [facility] he had T-wave inversion in 1 and aVL, V4 through V6. Cardiac catheterization done emergently by me showed low normal LV function with moderately severe focal hypocontractility of the mid anterolateral wall and minimal hypocontractility of the focal inferolateral wall. Ejection fraction was approximately 50-55%. He was found to have 40% smooth ostial narrowing of the LAD, a dominant left circumflex vessel with 70% ostial narrowing in the first marginal branch and diffuse 50% narrowing in the second marginal branch. His RCA was nondominant and had a 95% proximal stenosis and he had a subtotal/total mid occlusion with moderate right to right bridging collaterals and extensive left to right collaterals. Medical therapy was recommended and smoking cessation was felt to be imperative.   He has a long-standing history of tobacco and continues to smoke 1 ppd.  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  He continues to be on dual antiplatelet therapy and denies bleeding.  He also is on lisinopril 5 mg, amlodipine 2.5 mg and atorvastatin 40 mg. initially had been on Zetia but is no longer taking this.  He has history of GERD for which she is on Protonix.  He presents for evaluation.  I have reviewed recent laboratory from 08/24/2014.  He had been working outside in significant heat.  Hemoglobin was 13.2 medical 38.9.  BUN 15, creatinine 1.53, which had  increased from one year ago.  He is on a reduced dose of lisinopril at 5 mg.  Cholesterol was 140, triglycerides are elevated at 202, HDL was low at 25, and LDL 75.  TSH was normal.  Since I last saw him, he has continued to be active.  He is still smoking 1 pack per day.  He denies or exertional shortness of breath.  Does experience hip discomfort with walking and has attributed this to arthritis.  He is unaware of palpitations.  She denies PND orthopnea.  He denies visual changes.  At times there is some trivial ankle swelling.  He presents for evaluation.  Past Medical History  Diagnosis Date  . GERD (gastroesophageal reflux disease)   . STEMI (ST elevation myocardial infarction), 01/19/13 01/19/2013  . CAD (coronary artery disease) RCA non dominant vessel, LAD 40%, 80-90% 2nd diag EF 55%  01/19/2013  . Hyperlipidemia LDL goal < 70 01/19/2013  . Pulmonary nodules 01/19/2013  . Hypokalemia 01/20/2013  . Tobacco abuse 01/20/2013  . Metabolic syndrome, with mildly elevated HgBA1C 01/20/2013    Past Surgical History  Procedure Laterality Date  . Appendectomy      No Known Allergies  Current Outpatient Prescriptions  Medication Sig Dispense Refill  . acetaminophen (TYLENOL) 325 MG tablet Take 2 tablets (650 mg total) by mouth every 4 (four) hours as needed for headache or mild pain.      Marland Kitchen aspirin EC 81 MG EC tablet Take 1 tablet (81 mg total) by mouth daily.      . nitroGLYCERIN (NITROSTAT) 0.4 MG SL tablet  Place 1 tablet (0.4 mg total) under the tongue every 5 (five) minutes x 3 doses as needed for chest pain.  25 tablet  4  . amLODipine (NORVASC) 2.5 MG tablet Take 1 tablet (2.5 mg total) by mouth daily.  30 tablet  6  . atorvastatin (LIPITOR) 80 MG tablet Take 1 tablet (80 mg total) by mouth daily at 6 PM.  30 tablet  6  . clopidogrel (PLAVIX) 75 MG tablet Take 1 tablet (75 mg total) by mouth daily with breakfast.  30 tablet  6  . lisinopril (PRINIVIL,ZESTRIL) 5 MG tablet Take 1  tablet (5 mg total) by mouth daily.  30 tablet  6  . pantoprazole (PROTONIX) 40 MG tablet Take 1 tablet (40 mg total) by mouth daily at 6 (six) AM.  30 tablet  6   No current facility-administered medications for this visit.    History   Social History  . Marital Status: Widowed    Spouse Name: N/A    Number of Children: N/A  . Years of Education: N/A   Occupational History  . farmer    Social History Main Topics  . Smoking status: Current Every Day Smoker -- 1.00 packs/day for 75 years    Types: Cigarettes    Start date: 01/21/1963  . Smokeless tobacco: Never Used  . Alcohol Use: Not on file  . Drug Use: Not on file  . Sexual Activity: Not on file   Other Topics Concern  . Not on file   Social History Narrative  . No narrative on file   Social history is notable in that he is widowed. He does not have children. He is retired. He had smoked for 55 years but quit smoking 2 weeks ago at the time of his acute event. He does not drink alcohol.   Family history is notable for parents are deceased.  ROS General: Negative; No fevers, chills, or night sweats;  HEENT: Negative; No changes in vision or hearing, sinus congestion, difficulty swallowing Pulmonary: Negative; No cough, wheezing, shortness of breath, hemoptysis Cardiovascular: Negative; No chest pain, presyncope, syncope, palpatations GI: Negative; No nausea, vomiting, diarrhea, or abdominal pain GU: Negative; No dysuria, hematuria, or difficulty voiding Musculoskeletal: Occasional shoulder discomfort; no myalgias,  or weakness Hematologic/Oncology: Negative; no easy bruising, bleeding Endocrine: Negative; no heat/cold intolerance; no diabetes Neuro: Negative; no changes in balance, headaches Skin: Negative; No rashes or skin lesions Psychiatric: Negative; No behavioral problems, depression Sleep: Negative; No snoring, daytime sleepiness, hypersomnolence, bruxism, restless legs, hypnogognic hallucinations, no  cataplexy Other comprehensive 14 point system review is negative.   PE BP 130/64 mmHg  Pulse 54  Ht 5' 11"  (1.803 m)  Wt 176 lb 1.6 oz (79.878 kg)  BMI 24.57 kg/m2   Repeat blood pressure by me 120/70  Wt Readings from Last 3 Encounters:  03/05/15 176 lb 1.6 oz (79.878 kg)  12/23/14 167 lb 12.8 oz (76.114 kg)  08/27/14 165 lb (74.844 kg)   General: Alert, oriented, no distress.  Appears older than stated age.  Bearded with tobacco stains on his beard. Skin: Significant facial wrinkles HEENT: Normocephalic, atraumatic. Pupils round and reactive; sclera anicteric;no lid lag.  Nose without nasal septal hypertrophy Mouth/Parynx benign; Mallinpatti scale 3 Neck: No JVD, no carotid bruits with normal carotid upstroke Lungs: Decreased breath sounds; no wheezing or rales Chest wall: no tenderness to palpitation Heart: RRR, s1 s2 normal 1/6 sem; no diastolic murmur. No rubs thrills or heaves. Abdomen: soft, nontender; no hepatosplenomehaly, BS+;  abdominal aorta nontender and not dilated by palpation. Back: no CVA tenderness Pulses 2+ with bilateral femoral artery bruits. Extremities: no clubbing cyanosis or edema, Homan's sign negative  Neurologic: grossly nonfocal Psychologic: normal affect and mood.  ECG (independently read by me): Sinus bradycardia with 1 PAC.  Heart rate 54 bpm.  Normal intervals.  No significant ST-T changes.  June 2016 ECG (independently read by me): Sinus bradycardia 53 bpm.  January 2016 ECG (independently read by me): Sinus bradycardia 51 bpm.  July 2015 ECG (independently read by me): Sinus bradycardia 51 beats per minute.  No ectopy.  QTc interval 414 ms.  Prior 05/06/2013 ECG (independently read by me): Sinus bradycardia 51 beats per minute. No ectopy. No significant ST changes.  Prior ECG from 02/04/2013 ECG: Sinus rhythm with T-wave inversion in leads 1 and avL and V2  LABS: BMP Latest Ref Rng 08/24/2014 01/21/2013 01/20/2013  Glucose 70 - 99 mg/dL  109(H) 92 117(H)  BUN 6 - 23 mg/dL 15 15 12   Creatinine 0.50 - 1.35 mg/dL 1.53(H) 1.05 0.88  Sodium 135 - 145 mEq/L 140 139 135  Potassium 3.5 - 5.3 mEq/L 4.6 3.8 3.3(L)  Chloride 96 - 112 mEq/L 104 102 101  CO2 19 - 32 mEq/L 27 25 26   Calcium 8.4 - 10.5 mg/dL 9.2 9.2 8.5   Hepatic Function Latest Ref Rng 08/24/2014 01/19/2013  Total Protein 6.0 - 8.3 g/dL 6.5 6.3  Albumin 3.5 - 5.2 g/dL 3.8 3.1(L)  AST 0 - 37 U/L 14 28  ALT 0 - 53 U/L 9 9  Alk Phosphatase 39 - 117 U/L 125(H) 112  Total Bilirubin 0.2 - 1.2 mg/dL 0.5 0.2(L)   CBC Latest Ref Rng 08/24/2014 01/21/2013 01/20/2013  WBC 4.0 - 10.5 K/uL 11.2(H) 8.6 8.9  Hemoglobin 13.0 - 17.0 g/dL 13.2 12.8(L) 12.5(L)  Hematocrit 39.0 - 52.0 % 38.9(L) 37.9(L) 36.5(L)  Platelets 150 - 400 K/uL 198 165 169   Lab Results  Component Value Date   MCV 92.0 08/24/2014   MCV 94.5 01/21/2013   MCV 93.4 01/20/2013   Lab Results  Component Value Date   TSH 1.544 08/24/2014   Lab Results  Component Value Date   HGBA1C 6.2* 01/19/2013   Lipid Panel     Component Value Date/Time   CHOL 140 08/24/2014 0810   TRIG 202* 08/24/2014 0810   HDL 25* 08/24/2014 0810   CHOLHDL 5.6 08/24/2014 0810   VLDL 40 08/24/2014 0810   LDLCALC 75 08/24/2014 0810     RADIOLOGY: No results found.    ASSESSMENT AND PLAN: Mr. Gracey is a 71 -year-old gentleman who presented with an acute coronary syndrome and underwent emergent cardiac catheterization on11/23/2014. He was found to have subtotal occlusion of his RCA and had both right to right and extensive left-to-right collaterals. He also had concomitant CAD as noted above with 80-90% stenosis in a small second diagonal branch of his LAD as well as 70 and 50% stenoses in his marginal vessels. He had a mild wall motion abnormality consistent with his diagonal stenosis.  He has been on medical therapy.  Most recently consisting of lisinopril 5 mg, statin therapy, in addition to aspirin and Plavix.  He has  been bradycardic and is not on beta blocker treatment.  He denies recurrent anginal symptoms.  His blood pressure today remains stable.  He denies any significant exertional shortness of breath.  I again spent again long time with him discussing the importance of complete smoking cessation and  am doubtful that he will do this.  He has experienced recent hip discomfort with walking.  He has attributed this to arthritis.  Physical examination reveals bilateral femoral artery bruits.  I will schedule him for lower extremity arterial Doppler studies to make certain this is not claudication symptomatology.  Follow-up blood work will be obtained in the fasting state.  If his lipids remain abnormal, further adjustment to his medical regimen will be necessary.  On his last blood work, he had mild renal insufficiency, which will be reassessed on his current therapy.  I will see him in 4 months for reevaluation  Time spent: 25 minutes  Troy Sine, MD, [facility]  1:52 PM  03/05/2015

## 2015-03-08 ENCOUNTER — Other Ambulatory Visit: Payer: Self-pay | Admitting: Cardiovascular Disease

## 2015-03-08 DIAGNOSIS — M79662 Pain in left lower leg: Principal | ICD-10-CM

## 2015-03-08 DIAGNOSIS — M79661 Pain in right lower leg: Secondary | ICD-10-CM

## 2015-03-19 ENCOUNTER — Ambulatory Visit (HOSPITAL_COMMUNITY)
Admission: RE | Admit: 2015-03-19 | Discharge: 2015-03-19 | Disposition: A | Discharge status: Home / Self Care | Payer: Commercial Managed Care - HMO | Source: Ambulatory Visit | Attending: Urology | Admitting: Urology

## 2015-03-19 DIAGNOSIS — M79662 Pain in left lower leg: Secondary | ICD-10-CM | POA: Insufficient documentation

## 2015-03-19 DIAGNOSIS — M79661 Pain in right lower leg: Secondary | ICD-10-CM | POA: Diagnosis not present

## 2015-03-19 DIAGNOSIS — M79606 Pain in leg, unspecified: Secondary | ICD-10-CM

## 2015-03-19 DIAGNOSIS — F17200 Nicotine dependence, unspecified, uncomplicated: Secondary | ICD-10-CM | POA: Insufficient documentation

## 2015-03-19 DIAGNOSIS — E785 Hyperlipidemia, unspecified: Secondary | ICD-10-CM | POA: Insufficient documentation

## 2015-03-26 ENCOUNTER — Other Ambulatory Visit: Payer: Self-pay | Admitting: Cardiovascular Disease

## 2015-03-26 NOTE — Telephone Encounter (Signed)
Rx request sent to pharmacy.  

## 2015-04-02 ENCOUNTER — Telehealth: Payer: Self-pay | Admitting: Cardiovascular Disease

## 2015-04-02 NOTE — Telephone Encounter (Signed)
Swelling may be amlodipine related, a common side effect. Please be extra disciplined with sodium restriction and keep legs elevated. If swelling persists, please call back Monday. If shortness of breath develops, call on call doc sooner.

## 2015-04-02 NOTE — Telephone Encounter (Signed)
New message      Pt c/o swelling: STAT is pt has developed SOB within 24 hours  1. How long have you been experiencing swelling? Patient noticed it last night  2. Where is the swelling located? Both legs--knees down and swelling in his abdominal area 3.  Are you currently taking a "fluid pill"?  no 4.  Are you currently SOB? no 5.  Have you traveled recently?no

## 2015-04-02 NOTE — Telephone Encounter (Signed)
Returned call to [facility], patient's DPR and caregiver. Notes pt does not have a home phone - he receives all health communication through [place].  Pt seen last by Dr. Claiborne Billings 03/05/15. Notes that he may have had some slight swelling in his legs at that time, but seems to have gotten more noticeable.  Pt not complaining of any SOB or fatigue, exertional or otherwise.  He is currently not on a fluid pill. Most recent labwork done about 7 months ago. Note slightly abnormal and identified to have some mild renal impairment.  Will route to Dr. Sallyanne Kuster ([facility]) for consideration of low-dose diuretic, etc.

## 2015-04-02 NOTE — Telephone Encounter (Signed)
All instructions communicated to patient's DPR, who verbalized understanding.

## 2015-04-12 ENCOUNTER — Telehealth: Payer: Self-pay | Admitting: Cardiovascular Disease

## 2015-04-12 NOTE — Telephone Encounter (Signed)
New message      Calling to get ultrasound results from [facility]

## 2015-04-12 NOTE — Telephone Encounter (Signed)
PRELIMINARY RESULTS - DOPPLER TEST NORMAL AWAITING FOR DR Claiborne Billings TO RESULT ON THE TEST  MS. Kingfisher IS AWARE

## 2015-04-13 DIAGNOSIS — Z79899 Other long term (current) drug therapy: Secondary | ICD-10-CM | POA: Diagnosis not present

## 2015-04-13 DIAGNOSIS — I25810 Atherosclerosis of coronary artery bypass graft(s) without angina pectoris: Secondary | ICD-10-CM | POA: Diagnosis not present

## 2015-04-13 DIAGNOSIS — E785 Hyperlipidemia, unspecified: Secondary | ICD-10-CM | POA: Diagnosis not present

## 2015-04-14 LAB — CBC
HEMATOCRIT: 31.6 % — AB (ref 39.0–52.0)
HEMOGLOBIN: 10.3 g/dL — AB (ref 13.0–17.0)
MCH: 29.1 pg (ref 26.0–34.0)
MCHC: 32.6 g/dL (ref 30.0–36.0)
MCV: 89.3 fL (ref 78.0–100.0)
MPV: 10.9 fL (ref 8.6–12.4)
Platelets: 204 10*3/uL (ref 150–400)
RBC: 3.54 MIL/uL — ABNORMAL LOW (ref 4.22–5.81)
RDW: 13.9 % (ref 11.5–15.5)
WBC: 8.1 10*3/uL (ref 4.0–10.5)

## 2015-04-14 LAB — COMPREHENSIVE METABOLIC PANEL
ALBUMIN: 3.7 g/dL (ref 3.6–5.1)
ALK PHOS: 107 U/L (ref 40–115)
ALT: 8 U/L — ABNORMAL LOW (ref 9–46)
AST: 14 U/L (ref 10–35)
BUN: 17 mg/dL (ref 7–25)
CALCIUM: 8.6 mg/dL (ref 8.6–10.3)
CO2: 25 mmol/L (ref 20–31)
Chloride: 107 mmol/L (ref 98–110)
Creat: 1.32 mg/dL — ABNORMAL HIGH (ref 0.70–1.18)
Glucose, Bld: 108 mg/dL — ABNORMAL HIGH (ref 65–99)
POTASSIUM: 4.7 mmol/L (ref 3.5–5.3)
Sodium: 138 mmol/L (ref 135–146)
Total Bilirubin: 0.3 mg/dL (ref 0.2–1.2)
Total Protein: 6.4 g/dL (ref 6.1–8.1)

## 2015-04-14 LAB — LIPID PANEL
CHOL/HDL RATIO: 4.8 ratio (ref <=5.0)
Cholesterol: 116 mg/dL — ABNORMAL LOW (ref 125–200)
HDL: 24 mg/dL — AB (ref >=40)
LDL Cholesterol: 60 mg/dL (ref <130)
TRIGLYCERIDES: 162 mg/dL — AB (ref <150)
VLDL: 32 mg/dL — ABNORMAL HIGH (ref <30)

## 2015-04-14 LAB — TSH: TSH: 0.97 mIU/L (ref 0.40–4.50)

## 2015-04-16 ENCOUNTER — Telehealth: Payer: Self-pay | Admitting: *Deleted

## 2015-04-16 DIAGNOSIS — R899 Unspecified abnormal finding in specimens from other organs, systems and tissues: Secondary | ICD-10-CM

## 2015-04-16 DIAGNOSIS — D508 Other iron deficiency anemias: Secondary | ICD-10-CM

## 2015-04-16 NOTE — Telephone Encounter (Signed)
Patient notified of lab results.  Will go to lab next week for H&H and will pick up hemoccult cards x 3.

## 2015-04-16 NOTE — Telephone Encounter (Signed)
-----   Message from Troy Sine, MD sent at 04/16/2015  2:48 PM EST ----- Labs better x anemic now with h/h 10.1/31.6; f/u cbc next week; check stool guiacs

## 2015-04-21 ENCOUNTER — Other Ambulatory Visit: Payer: Self-pay | Admitting: *Deleted

## 2015-04-21 ENCOUNTER — Telehealth: Payer: Self-pay | Admitting: Cardiovascular Disease

## 2015-04-21 DIAGNOSIS — D649 Anemia, unspecified: Secondary | ICD-10-CM | POA: Diagnosis not present

## 2015-04-21 DIAGNOSIS — D508 Other iron deficiency anemias: Secondary | ICD-10-CM

## 2015-04-21 DIAGNOSIS — R899 Unspecified abnormal finding in specimens from other organs, systems and tissues: Secondary | ICD-10-CM

## 2015-04-21 DIAGNOSIS — Z5181 Encounter for therapeutic drug level monitoring: Secondary | ICD-10-CM

## 2015-04-21 DIAGNOSIS — Z7902 Long term (current) use of antithrombotics/antiplatelets: Secondary | ICD-10-CM | POA: Diagnosis not present

## 2015-04-21 DIAGNOSIS — K921 Melena: Secondary | ICD-10-CM

## 2015-04-21 NOTE — Telephone Encounter (Signed)
Returned call to [facility] and spoke w/ other representative - was clarified to me that occult blood test can be ordered - they give pt a kit to collect sample and return for a send-off study.  Order entered and submitted.

## 2015-04-21 NOTE — Telephone Encounter (Signed)
Lab order for H&H released. [facility] does not do hemocult cards - - will check to see if other lab testing site does.

## 2015-04-22 LAB — HEMOGLOBIN AND HEMATOCRIT, BLOOD
HEMATOCRIT: 31.4 % — AB (ref 39.0–52.0)
Hemoglobin: 10.1 g/dL — ABNORMAL LOW (ref 13.0–17.0)

## 2015-04-23 LAB — FECAL OCCULT BLOOD, IMMUNOCHEMICAL: Fecal Occult Blood: POSITIVE — AB

## 2015-04-28 ENCOUNTER — Telehealth: Payer: Self-pay | Admitting: Cardiovascular Disease

## 2015-04-28 NOTE — Telephone Encounter (Signed)
lmtc

## 2015-04-28 NOTE — Telephone Encounter (Signed)
Returned call and left msg at number for pt's DPR contact. Other line on file goes to disconnected #/no longer in service.

## 2015-04-28 NOTE — Telephone Encounter (Signed)
I think he can hold Plavix until his PCP (or GI doc) figures out the next step in evaluating the Hemoccult + stools. If taking ASA - reduce to 81 mg.  Leonie Man, MD

## 2015-04-28 NOTE — Telephone Encounter (Signed)
2/22 - hgb 10.1  3/1 - fecal occult blood POS - results not yet sent to Dr. Evette Georges basket from laboratory.  Routed to [facility] to review.

## 2015-04-28 NOTE — Telephone Encounter (Signed)
New message ° ° ° ° ° ° °Calling to get lab results °

## 2015-04-28 NOTE — Telephone Encounter (Signed)
Calling again,still waiting to get lab results.

## 2015-04-29 NOTE — Telephone Encounter (Signed)
Follow up ° ° ° ° ° °Returning a call to get lab results °

## 2015-04-29 NOTE — Telephone Encounter (Signed)
Returned call to [facility], pt's DPR contact. Recommendations given to defer to primary care for investigation on GI bleed given. She voiced understanding. Pt to see provider at [place] for eval.

## 2015-05-04 ENCOUNTER — Ambulatory Visit (INDEPENDENT_AMBULATORY_CARE_PROVIDER_SITE_OTHER): Payer: Commercial Managed Care - HMO | Admitting: Family Medicine

## 2015-05-04 ENCOUNTER — Encounter: Payer: Self-pay | Admitting: Family Medicine

## 2015-05-04 VITALS — BP 117/66 | HR 61 | Temp 97.5°F | Ht 71.0 in | Wt 174.6 lb

## 2015-05-04 DIAGNOSIS — R918 Other nonspecific abnormal finding of lung field: Secondary | ICD-10-CM | POA: Diagnosis not present

## 2015-05-04 DIAGNOSIS — E785 Hyperlipidemia, unspecified: Secondary | ICD-10-CM | POA: Diagnosis not present

## 2015-05-04 DIAGNOSIS — E8881 Metabolic syndrome: Secondary | ICD-10-CM

## 2015-05-04 DIAGNOSIS — R195 Other fecal abnormalities: Secondary | ICD-10-CM

## 2015-05-04 DIAGNOSIS — N400 Enlarged prostate without lower urinary tract symptoms: Secondary | ICD-10-CM | POA: Diagnosis not present

## 2015-05-04 LAB — BAYER DCA HB A1C WAIVED: HB A1C (BAYER DCA - WAIVED): 5.7 % (ref <7.0)

## 2015-05-04 NOTE — Progress Notes (Signed)
BP 117/66 mmHg  Pulse 61  Temp(Src) 97.5 F (36.4 C) (Oral)  Ht 5\' 11"  (1.803 m)  Wt 174 lb 9.6 oz (79.198 kg)  BMI 24.36 kg/m2   Subjective:    [name], male    DOB: 1945-03-11, 70 y.o.   MRN: [id number]  [name] is a 70 y.o. male presenting on 05/04/2015 for Follow-up and pulmonary nodule   HPI Pulmonary nodules Patient had pulmonary nodules on CT scan in 2014 and never followed up on it. He is a chronic smoker and still smokes. He denies any shortness of breath or wheezing. He denies any chest pain.  Hemoccult positive stools Patient comes in today because he was having a drop in his hemoglobin from 11-10.1 over the past 4 months and this was also associated with fecal occult positive last month. He has not had a colonoscopy in many many years. He denies any frank red blood. He denies any lightheadedness or dizziness  Metabolic syndrome and hyperlipidemia Patient has had a previously elevated hemoglobin A1c and has not had it done in quite a few years. He follows up with his cholesterol and other labs with his cardiologist because he has had a previous CAD and MI.  Relevant past medical, surgical, family and social history reviewed and updated as indicated. Interim medical history since our last visit reviewed. Allergies and medications reviewed and updated.  Review of Systems  Constitutional: Negative for fever and chills.  HENT: Negative for ear discharge and ear pain.   Eyes: Negative for discharge and visual disturbance.  Respiratory: Negative for chest tightness, shortness of breath and wheezing.   Cardiovascular: Negative for chest pain and leg swelling.  Gastrointestinal: Negative for abdominal pain, diarrhea and constipation.  Genitourinary: Negative for difficulty urinating.  Musculoskeletal: Negative for back pain, arthralgias and gait problem.  Skin: Negative for rash.  Neurological: Negative for dizziness, syncope, light-headedness  and headaches.  All other systems reviewed and are negative.   Per HPI unless specifically indicated above     Medication List       This list is accurate as of: 05/04/15  2:08 PM.  Always use your most recent med list.               aspirin 81 MG EC tablet  Take 1 tablet (81 mg total) by mouth daily.     atorvastatin 40 MG tablet  Commonly known as:  LIPITOR  TAKE 1 TABLET BY MOUTH EVERY DAY     clopidogrel 75 MG tablet  Commonly known as:  PLAVIX  TAKE 1 TABLET BY MOUTH DAILY WITH BREAKFAST     lisinopril 5 MG tablet  Commonly known as:  PRINIVIL,ZESTRIL  Take 1 tablet (5 mg total) by mouth daily.     nitroGLYCERIN 0.4 MG SL tablet  Commonly known as:  NITROSTAT  Place 1 tablet (0.4 mg total) under the tongue every 5 (five) minutes x 3 doses as needed for chest pain.     pantoprazole 40 MG tablet  Commonly known as:  PROTONIX  TAKE 1 TABLET BY MOUTH DAILY AT 6:00 A.M.           Objective:    BP 117/66 mmHg  Pulse 61  Temp(Src) 97.5 F (36.4 C) (Oral)  Ht 5\' 11"  (1.803 m)  Wt 174 lb 9.6 oz (79.198 kg)  BMI 24.36 kg/m2  Wt Readings from Last 3 Encounters:  05/04/15 174 lb 9.6 oz (79.198 kg)  03/05/15 176 lb 1.6 oz (79.878 kg)  12/23/14 167 lb 12.8 oz (76.114 kg)    Physical Exam  Constitutional: He is oriented to person, place, and time. He appears well-developed and well-nourished. No distress.  Eyes: Conjunctivae and EOM are normal. Pupils are equal, round, and reactive to light. Right eye exhibits no discharge. No scleral icterus.  Neck: Neck supple. No thyromegaly present.  Cardiovascular: Normal rate, regular rhythm, normal heart sounds and intact distal pulses.   No murmur heard. Pulmonary/Chest: Effort normal and breath sounds normal. No respiratory distress. He has no wheezes.  Musculoskeletal: Normal range of motion. He exhibits no edema.  Lymphadenopathy:    He has no cervical adenopathy.  Neurological: He is alert and oriented to person,  place, and time. Coordination normal.  Skin: Skin is warm and dry. No rash noted. He is not diaphoretic.  Psychiatric: He has a normal mood and affect. His behavior is normal.  Nursing note and vitals reviewed.   Results for orders placed or performed in visit on 04/21/15  Hemoglobin and hematocrit, blood  Result Value Ref Range   Hemoglobin 10.1 (L) 13.0 - 17.0 g/dL   HCT 31.4 (L) 39.0 - 52.0 %      Assessment & Plan:   Problem List Items Addressed This Visit      Other   Hyperlipidemia with target LDL less than 70   Pulmonary nodules - Primary   Relevant Orders   CT Chest W Contrast   Metabolic syndrome, with mildly elevated HgBA1C   Relevant Orders   Bayer DCA Hb A1c Waived   Enlarged prostate on rectal examination   Relevant Orders   PSA, total and free    Other Visit Diagnoses    Occult blood in stools        Relevant Orders    Ambulatory referral to Gastroenterology    CBC with Differential/Platelet        Follow up plan: Return in about 4 weeks (around 06/01/2015), or if symptoms worsen or fail to improve, for Follow-up anemia and occult blood.  Counseling provided for all of the vaccine components Orders Placed This Encounter  Procedures  . CT Chest W Contrast  . Bayer DCA Hb A1c Waived  . CBC with Differential/Platelet  . PSA, total and free  . Ambulatory referral to Gastroenterology    Caryl Pina, MD [facility] 05/04/2015, 2:08 PM

## 2015-05-05 ENCOUNTER — Encounter: Payer: Self-pay | Admitting: Internal Medicine

## 2015-05-05 LAB — CBC WITH DIFFERENTIAL/PLATELET
BASOS: 0 %
Basophils Absolute: 0 10*3/uL (ref 0.0–0.2)
EOS (ABSOLUTE): 0.5 10*3/uL — ABNORMAL HIGH (ref 0.0–0.4)
Eos: 6 %
HEMATOCRIT: 31.4 % — AB (ref 37.5–51.0)
HEMOGLOBIN: 10 g/dL — AB (ref 12.6–17.7)
IMMATURE GRANS (ABS): 0 10*3/uL (ref 0.0–0.1)
Immature Granulocytes: 0 %
LYMPHS: 24 %
Lymphocytes Absolute: 2 10*3/uL (ref 0.7–3.1)
MCH: 28.8 pg (ref 26.6–33.0)
MCHC: 31.8 g/dL (ref 31.5–35.7)
MCV: 91 fL (ref 79–97)
MONOCYTES: 10 %
Monocytes Absolute: 0.9 10*3/uL (ref 0.1–0.9)
NEUTROS ABS: 5.1 10*3/uL (ref 1.4–7.0)
Neutrophils: 60 %
Platelets: 245 10*3/uL (ref 150–379)
RBC: 3.47 x10E6/uL — ABNORMAL LOW (ref 4.14–5.80)
RDW: 14.2 % (ref 12.3–15.4)
WBC: 8.5 10*3/uL (ref 3.4–10.8)

## 2015-05-05 LAB — PSA, TOTAL AND FREE
PSA, Free Pct: 42 %
PSA, Free: 0.42 ng/mL
Prostate Specific Ag, Serum: 1 ng/mL (ref 0.0–4.0)

## 2015-05-14 DIAGNOSIS — I2510 Atherosclerotic heart disease of native coronary artery without angina pectoris: Secondary | ICD-10-CM | POA: Diagnosis not present

## 2015-05-14 DIAGNOSIS — R918 Other nonspecific abnormal finding of lung field: Secondary | ICD-10-CM | POA: Diagnosis not present

## 2015-05-19 ENCOUNTER — Telehealth: Payer: Self-pay | Admitting: *Deleted

## 2015-05-19 ENCOUNTER — Telehealth: Payer: Self-pay | Admitting: Family Medicine

## 2015-05-19 DIAGNOSIS — R918 Other nonspecific abnormal finding of lung field: Secondary | ICD-10-CM

## 2015-05-19 NOTE — Telephone Encounter (Signed)
Spoke with them about the CT scan result that was done at [facility]. The CT scan results shows that there are more of a cluster of nodules that were there before. Please do a referral to pulmonology. Caryl Pina, MD [facility] 05/19/2015, 2:13 PM

## 2015-05-19 NOTE — Telephone Encounter (Signed)
-----   Message from Troy Sine, MD sent at 05/17/2015  8:25 AM EDT ----- fecal occult pos;  Re-check CBC iron studies and stool; copy to primary

## 2015-05-19 NOTE — Telephone Encounter (Signed)
Patient is calling requesting CT scan results. Results are in your office

## 2015-05-19 NOTE — Telephone Encounter (Signed)
Called to give patient results. Voice mail box not set up. Patient has appointment with GI doctor tomorrow who is also on EPIC.

## 2015-05-20 ENCOUNTER — Other Ambulatory Visit: Payer: Self-pay

## 2015-05-20 ENCOUNTER — Encounter: Payer: Self-pay | Admitting: Nurse Practitioner

## 2015-05-20 ENCOUNTER — Ambulatory Visit (INDEPENDENT_AMBULATORY_CARE_PROVIDER_SITE_OTHER): Payer: Commercial Managed Care - HMO | Admitting: Nurse Practitioner

## 2015-05-20 VITALS — BP 178/64 | HR 57 | Temp 96.7°F | Ht 71.0 in | Wt 175.4 lb

## 2015-05-20 DIAGNOSIS — D649 Anemia, unspecified: Secondary | ICD-10-CM

## 2015-05-20 DIAGNOSIS — R195 Other fecal abnormalities: Secondary | ICD-10-CM

## 2015-05-20 DIAGNOSIS — K921 Melena: Secondary | ICD-10-CM

## 2015-05-20 LAB — CBC
HCT: 31.9 % — ABNORMAL LOW (ref 39.0–52.0)
Hemoglobin: 10.2 g/dL — ABNORMAL LOW (ref 13.0–17.0)
MCH: 28.2 pg (ref 26.0–34.0)
MCHC: 32 g/dL (ref 30.0–36.0)
MCV: 88.1 fL (ref 78.0–100.0)
MPV: 11.3 fL (ref 8.6–12.4)
PLATELETS: 232 10*3/uL (ref 150–400)
RBC: 3.62 MIL/uL — AB (ref 4.22–5.81)
RDW: 14.1 % (ref 11.5–15.5)
WBC: 9.1 10*3/uL (ref 4.0–10.5)

## 2015-05-20 MED ORDER — PEG-KCL-NACL-NASULF-NA ASC-C 100 G PO SOLR: 1.0000 | ORAL | Status: DC

## 2015-05-20 MED ORDER — PEG 3350-KCL-NA BICARB-NACL 420 G PO SOLR: 4000.0000 mL | ORAL | Status: DC

## 2015-05-20 NOTE — Patient Instructions (Signed)
1. Have your blood work drawn in the next day or two. 2. We will schedule your procedures for you. 3. We will request your previous colonoscopy records from [facility]. 4. Return for follow-up in 2 months. 5. Further recommendations to be based on results of your procedures.

## 2015-05-20 NOTE — Progress Notes (Signed)
PA# for TCS/EGD  [id number]

## 2015-05-20 NOTE — Assessment & Plan Note (Signed)
Patient with heme positive stool with no overt bleeding noted. History of gastric ulcer 2-1/2 decades ago, last colonoscopy over 10 years ago. At this point we'll proceed with colonoscopy and endoscopy for further evaluation. We'll also check a CBC to monitor his hemoglobin. Return for follow-up in 2 months. If colonoscopy and endoscopy are normal can consider capsule endoscopy at that time.  Proceed with TCS and EGD with Dr. Gala Romney in near future: the risks, benefits, and alternatives have been discussed with the patient in detail. The patient states understanding and desires to proceed.  The patient's Plavix has been held by cardiology given anemia, not on any other anticoagulants, anxiolytics, chronic pain medications, or antidepressants. Denies alcohol and drug use. Conscious sedation should be adequate for his procedure.

## 2015-05-20 NOTE — Progress Notes (Signed)
cc'ed to pcp °

## 2015-05-20 NOTE — Progress Notes (Signed)
Primary Care Physician:  Wardell Honour, MD Primary Gastroenterologist:  Dr. Gala Romney  Chief Complaint  Patient presents with  . Blood In Stools    HPI:   Steven Drake is a 70 y.o. male who presents on referral from primary care for occult blood in stools. CT notes reviewed, last saw PCP related to this issue on 05/04/2015. At that point states decreasing hemoglobin from 11-10.1 over the past 4 months with noted heme positive stools on fecal occult blood test. No noted frank red blood, denied dizziness and lightheadedness at that time. Stated no colonoscopy in "many many years." No records of colonoscopy found in our system.  Today he states he has not seen any obvious blood. Has a history of an ulcer approximately 25 years on EGD, ago which healed with no recurrent problems. Has GERD, symptoms well controlled on PPI except if he eats high-fat foods. Last colonoscopy was over 10 years ago in [facility]. Denies abdominal pain, N/V, hematochezia, melena, unintentional weight loss, fever, chills. Has a bowel movement 1-2 times a day consistent with Bristol 4. Denies acute changes in bowel habits or characteristics. Cardiologist had him stop Plavix until source of anemia and bleeding identified. Denies chest pain, dyspnea, dizziness, lightheadedness, syncope, near syncope. Denies any other upper or lower GI symptoms.  Past Medical History  Diagnosis Date  . GERD (gastroesophageal reflux disease)   . STEMI (ST elevation myocardial infarction), 01/19/13 01/19/2013  . CAD (coronary artery disease) RCA non dominant vessel, LAD 40%, 80-90% 2nd diag EF 55%  01/19/2013  . Hyperlipidemia LDL goal < 70 01/19/2013  . Pulmonary nodules 01/19/2013  . Hypokalemia 01/20/2013  . Tobacco abuse 01/20/2013  . Metabolic syndrome, with mildly elevated HgBA1C 01/20/2013    Past Surgical History  Procedure Laterality Date  . Appendectomy    . Left heart cath N/A 01/19/2013    Procedure: LEFT HEART  CATH;  Surgeon: Troy Sine, MD;  Location: [facility] CATH LAB;  Service: Cardiovascular;  Laterality: N/A;  . Hernia repair      Current Outpatient Prescriptions  Medication Sig Dispense Refill  . aspirin EC 81 MG EC tablet Take 1 tablet (81 mg total) by mouth daily.    Marland Kitchen atorvastatin (LIPITOR) 40 MG tablet TAKE 1 TABLET BY MOUTH EVERY DAY 90 tablet 0  . lisinopril (PRINIVIL,ZESTRIL) 5 MG tablet Take 1 tablet (5 mg total) by mouth daily. 90 tablet 3  . pantoprazole (PROTONIX) 40 MG tablet TAKE 1 TABLET BY MOUTH DAILY AT 6:00 A.M. 30 tablet 10  . clopidogrel (PLAVIX) 75 MG tablet TAKE 1 TABLET BY MOUTH DAILY WITH BREAKFAST (Patient not taking: Reported on 05/04/2015) 30 tablet 10   No current facility-administered medications for this visit.    Allergies as of 05/20/2015 - Review Complete 05/20/2015  Allergen Reaction Noted  . Contrast media [iodinated diagnostic agents]  05/20/2015    Family History  Problem Relation Age of Onset  . Cancer Father     colon  . Cancer Sister     colon    Social History   Social History  . Marital Status: Widowed    Spouse Name: N/A  . Number of Children: N/A  . Years of Education: N/A   Occupational History  . farmer    Social History Main Topics  . Smoking status: Current Every Day Smoker -- 1.00 packs/day for 58 years    Types: Cigarettes    Start date: 01/21/1963  . Smokeless tobacco: Never  Used     Comment: one pack daily  . Alcohol Use: No  . Drug Use: No  . Sexual Activity: Not on file   Other Topics Concern  . Not on file   Social History Narrative    Review of Systems: General: Negative for anorexia, weight loss, fever, chills, fatigue, weakness. Eyes: Negative for vision changes.  ENT: Negative for hoarseness, difficulty swallowing. CV: Negative for chest pain, angina, palpitations, peripheral edema.  Respiratory: Negative for dyspnea at rest, cough, sputum, wheezing.  GI: See history of present illness. Derm: Negative  for rash or itching.  Endo: Negative for unusual weight change.  Heme: Negative for bruising or bleeding. Allergy: Negative for rash or hives.    Physical Exam: BP 178/64 mmHg  Pulse 57  Temp(Src) 96.7 F (35.9 C) (Oral)  Ht 5\' 11"  (1.803 m)  Wt 175 lb 6.4 oz (79.561 kg)  BMI 24.47 kg/m2 General:   Alert and oriented. Pleasant and cooperative. Well-nourished and well-developed.  Head:  Normocephalic and atraumatic. Eyes:  Without icterus, sclera clear and conjunctiva pink.  Ears:  Normal auditory acuity. Cardiovascular:  S1, S2 present without murmurs appreciated. Extremities without clubbing or edema. Respiratory:  Clear to auscultation bilaterally. No wheezes, rales, or rhonchi. No distress.  Gastrointestinal:  +BS, soft, non-tender and non-distended. No HSM noted. No guarding or rebound. No masses appreciated.  Rectal:  Deferred  Musculoskalatal:  Symmetrical without gross deformities. Skin:  Intact without significant lesions or rashes. Neurologic:  Alert and oriented x4;  grossly normal neurologically. Psych:  Alert and cooperative. Normal mood and affect. Heme/Lymph/Immune: No excessive bruising noted.    05/20/2015 10:56 AM   Disclaimer: This note was dictated with voice recognition software. Similar sounding words can inadvertently be transcribed and may not be corrected upon review.

## 2015-05-20 NOTE — Assessment & Plan Note (Signed)
Patient with worsening anemia with a decline in hemoglobin of 11.1-10.0 over the past 3-4 months. Also heme positive stool on fecal occult blood test as noted above. Today I'll recheck a CBC to monitor his hemoglobin, colonoscopy and endoscopy as noted above. We'll also request records from [facility] last colonoscopy. Return for follow-up in 2 months. Generally he is asymptomatic with his anemia. Also asymptomatic from a GI standpoint.

## 2015-06-01 ENCOUNTER — Encounter: Payer: Self-pay | Admitting: Family Medicine

## 2015-06-01 ENCOUNTER — Ambulatory Visit (INDEPENDENT_AMBULATORY_CARE_PROVIDER_SITE_OTHER): Payer: Commercial Managed Care - HMO | Admitting: Family Medicine

## 2015-06-01 VITALS — BP 115/58 | HR 63 | Temp 98.3°F | Ht 71.0 in | Wt 175.0 lb

## 2015-06-01 DIAGNOSIS — R195 Other fecal abnormalities: Secondary | ICD-10-CM

## 2015-06-01 DIAGNOSIS — R918 Other nonspecific abnormal finding of lung field: Secondary | ICD-10-CM | POA: Diagnosis not present

## 2015-06-01 DIAGNOSIS — D649 Anemia, unspecified: Secondary | ICD-10-CM | POA: Diagnosis not present

## 2015-06-01 DIAGNOSIS — N400 Enlarged prostate without lower urinary tract symptoms: Secondary | ICD-10-CM | POA: Diagnosis not present

## 2015-06-01 MED ORDER — TAMSULOSIN HCL 0.4 MG PO CAPS: 0.4000 mg | ORAL_CAPSULE | Freq: Every day | ORAL | Status: DC

## 2015-06-01 NOTE — Assessment & Plan Note (Signed)
His blood counts have been stable and he has an appointment with gastroenterology to get an EGD and colonoscopy next week.

## 2015-06-01 NOTE — Assessment & Plan Note (Signed)
Patient wants to wait and just repeat the scan in 6 months. He does not want to go to pulmonology for a possible biopsy.

## 2015-06-01 NOTE — Progress Notes (Signed)
BP 115/58 mmHg  Pulse 63  Temp(Src) 98.3 F (36.8 C) (Oral)  Ht 5\' 11"  (1.803 m)  Wt 175 lb (79.379 kg)  BMI 24.42 kg/m2   Subjective:    [name], male    DOB: Jul 12, 1945, 70 y.o.   MRN: [id number]  [name] is a 70 y.o. male presenting on 06/01/2015 for Followup anemia & occult blood   HPI Pulmonary nodules and smoker Patient had an old pulmonary nodule in the left upper lobe but on the new imaging CT scan he had multiple smaller right upper and middle lobe nodules. We discussed the possibility of what they could be. He says he is asymptomatic for any cough or shortness of breath or wheezing or fevers or chills. We discussed going to see pulmonology and possibly doing a biopsy and he says that he does not want to do that because he had a bloody the biopsy when it up having a collapsed lung. He would rather just wait and repeat CT scan in 6 months. He understands that this could be cancer and still wants to wait. He is smoker chronically and has no desire to quit smoking at this point.  Heme positive stools and anemia He is seen gastroenterology for this and they are planning to do a colonoscopy and an EGD about a week from now. He denies any frank blood in stool. Hemoccult just found microscopic blood. He denies any lightheadedness or dizziness. His blood counts have been stable in the 10.2 range.  Prostate enlargement Patient has been having some symptoms of waking up twice a night to urinate and having some issues with starting his stream and would like to discuss the possibilities of medications. He has never been on any medications for this before. He had a normal PSA on recent laboratory examination within a year. He doesn't have issues with this on every time he has to urinate but only sporadically.  Relevant past medical, surgical, family and social history reviewed and updated as indicated. Interim medical history since our last visit  reviewed. Allergies and medications reviewed and updated.  Review of Systems  Constitutional: Negative for fever and chills.  HENT: Negative for congestion, ear discharge and ear pain.   Eyes: Negative for pain, discharge and visual disturbance.  Respiratory: Negative for cough, chest tightness, shortness of breath and wheezing.   Cardiovascular: Negative for chest pain and leg swelling.  Gastrointestinal: Negative for nausea, vomiting, abdominal pain, diarrhea, constipation and blood in stool.  Genitourinary: Negative for difficulty urinating.  Musculoskeletal: Negative for back pain and gait problem.  Skin: Negative for rash.  Neurological: Negative for dizziness, syncope, light-headedness and headaches.  All other systems reviewed and are negative.   Per HPI unless specifically indicated above     Medication List       This list is accurate as of: 06/01/15  1:37 PM.  Always use your most recent med list.               aspirin 81 MG EC tablet  Take 1 tablet (81 mg total) by mouth daily.     atorvastatin 40 MG tablet  Commonly known as:  LIPITOR  TAKE 1 TABLET BY MOUTH EVERY DAY     clopidogrel 75 MG tablet  Commonly known as:  PLAVIX  TAKE 1 TABLET BY MOUTH DAILY WITH BREAKFAST     lisinopril 5 MG tablet  Commonly known as:  PRINIVIL,ZESTRIL  Take 1 tablet (5  mg total) by mouth daily.     pantoprazole 40 MG tablet  Commonly known as:  PROTONIX  TAKE 1 TABLET BY MOUTH DAILY AT 6:00 A.M.     polyethylene glycol-electrolytes 420 g solution  Commonly known as:  TRILYTE  Take 4,000 mLs by mouth as directed.     tamsulosin 0.4 MG Caps capsule  Commonly known as:  FLOMAX  Take 1 capsule (0.4 mg total) by mouth daily.           Objective:    BP 115/58 mmHg  Pulse 63  Temp(Src) 98.3 F (36.8 C) (Oral)  Ht 5\' 11"  (1.803 m)  Wt 175 lb (79.379 kg)  BMI 24.42 kg/m2  Wt Readings from Last 3 Encounters:  06/01/15 175 lb (79.379 kg)  05/20/15 175 lb 6.4 oz  (79.561 kg)  05/04/15 174 lb 9.6 oz (79.198 kg)    Physical Exam  Constitutional: He is oriented to person, place, and time. He appears well-developed and well-nourished. No distress.  HENT:  Right Ear: External ear normal.  Left Ear: External ear normal.  Nose: Nose normal.  Mouth/Throat: Oropharynx is clear and moist.  Eyes: Conjunctivae and EOM are normal. Pupils are equal, round, and reactive to light. Right eye exhibits no discharge. No scleral icterus.  Neck: Neck supple. No thyromegaly present.  Cardiovascular: Normal rate, regular rhythm, normal heart sounds and intact distal pulses.   No murmur heard. Pulmonary/Chest: Effort normal and breath sounds normal. No respiratory distress. He has no wheezes.  Abdominal: He exhibits no distension. There is no tenderness. There is no rebound.  Musculoskeletal: Normal range of motion. He exhibits no edema.  Lymphadenopathy:    He has no cervical adenopathy.  Neurological: He is alert and oriented to person, place, and time. Coordination normal.  Skin: Skin is warm and dry. No rash noted. He is not diaphoretic.  Psychiatric: He has a normal mood and affect. His behavior is normal.  Nursing note and vitals reviewed.   Results for orders placed or performed in visit on 05/20/15  CBC  Result Value Ref Range   WBC 9.1 4.0 - 10.5 K/uL   RBC 3.62 (L) 4.22 - 5.81 MIL/uL   Hemoglobin 10.2 (L) 13.0 - 17.0 g/dL   HCT 31.9 (L) 39.0 - 52.0 %   MCV 88.1 78.0 - 100.0 fL   MCH 28.2 26.0 - 34.0 pg   MCHC 32.0 30.0 - 36.0 g/dL   RDW 14.1 11.5 - 15.5 %   Platelets 232 150 - 400 K/uL   MPV 11.3 8.6 - 12.4 fL      Assessment & Plan:   Problem List Items Addressed This Visit      Other   Pulmonary nodules    Patient wants to wait and just repeat the scan in 6 months. He does not want to go to pulmonology for a possible biopsy.      Heme + stool    His blood counts have been stable and he has an appointment with gastroenterology to get an  EGD and colonoscopy next week.      Anemia - Primary    Other Visit Diagnoses    BPH (benign prostatic hypertrophy)        Relevant Medications    tamsulosin (FLOMAX) 0.4 MG CAPS capsule        Follow up plan: Return in about 2 months (around 08/01/2015), or if symptoms worsen or fail to improve, for Hypertension recheck.  Counseling provided for all  of the vaccine components No orders of the defined types were placed in this encounter.    Caryl Pina, MD [facility] 06/01/2015, 1:37 PM

## 2015-06-10 ENCOUNTER — Encounter (HOSPITAL_COMMUNITY)
Admission: RE | Disposition: A | Discharge status: Home / Self Care | Payer: Self-pay | Source: Ambulatory Visit | Attending: Internal Medicine

## 2015-06-10 ENCOUNTER — Ambulatory Visit (HOSPITAL_COMMUNITY)
Admission: RE | Admit: 2015-06-10 | Discharge: 2015-06-10 | Disposition: A | Discharge status: Home / Self Care | Payer: Commercial Managed Care - HMO | Source: Ambulatory Visit | Attending: Internal Medicine | Admitting: Internal Medicine

## 2015-06-10 ENCOUNTER — Encounter (HOSPITAL_COMMUNITY): Payer: Self-pay | Admitting: *Deleted

## 2015-06-10 DIAGNOSIS — K222 Esophageal obstruction: Secondary | ICD-10-CM | POA: Insufficient documentation

## 2015-06-10 DIAGNOSIS — K921 Melena: Secondary | ICD-10-CM

## 2015-06-10 DIAGNOSIS — K219 Gastro-esophageal reflux disease without esophagitis: Secondary | ICD-10-CM | POA: Diagnosis not present

## 2015-06-10 DIAGNOSIS — D125 Benign neoplasm of sigmoid colon: Secondary | ICD-10-CM | POA: Insufficient documentation

## 2015-06-10 DIAGNOSIS — I252 Old myocardial infarction: Secondary | ICD-10-CM | POA: Diagnosis not present

## 2015-06-10 DIAGNOSIS — C183 Malignant neoplasm of hepatic flexure: Secondary | ICD-10-CM | POA: Insufficient documentation

## 2015-06-10 DIAGNOSIS — K5730 Diverticulosis of large intestine without perforation or abscess without bleeding: Secondary | ICD-10-CM | POA: Diagnosis not present

## 2015-06-10 DIAGNOSIS — Z86010 Personal history of colonic polyps: Secondary | ICD-10-CM | POA: Insufficient documentation

## 2015-06-10 DIAGNOSIS — F17210 Nicotine dependence, cigarettes, uncomplicated: Secondary | ICD-10-CM | POA: Insufficient documentation

## 2015-06-10 DIAGNOSIS — C180 Malignant neoplasm of cecum: Secondary | ICD-10-CM | POA: Diagnosis not present

## 2015-06-10 DIAGNOSIS — D123 Benign neoplasm of transverse colon: Secondary | ICD-10-CM | POA: Insufficient documentation

## 2015-06-10 DIAGNOSIS — K3189 Other diseases of stomach and duodenum: Secondary | ICD-10-CM | POA: Insufficient documentation

## 2015-06-10 DIAGNOSIS — E785 Hyperlipidemia, unspecified: Secondary | ICD-10-CM | POA: Diagnosis not present

## 2015-06-10 DIAGNOSIS — I2510 Atherosclerotic heart disease of native coronary artery without angina pectoris: Secondary | ICD-10-CM | POA: Insufficient documentation

## 2015-06-10 DIAGNOSIS — R195 Other fecal abnormalities: Secondary | ICD-10-CM | POA: Diagnosis not present

## 2015-06-10 DIAGNOSIS — Z91041 Radiographic dye allergy status: Secondary | ICD-10-CM | POA: Diagnosis not present

## 2015-06-10 DIAGNOSIS — R1310 Dysphagia, unspecified: Secondary | ICD-10-CM | POA: Insufficient documentation

## 2015-06-10 DIAGNOSIS — K449 Diaphragmatic hernia without obstruction or gangrene: Secondary | ICD-10-CM | POA: Insufficient documentation

## 2015-06-10 DIAGNOSIS — K259 Gastric ulcer, unspecified as acute or chronic, without hemorrhage or perforation: Secondary | ICD-10-CM | POA: Diagnosis not present

## 2015-06-10 DIAGNOSIS — D649 Anemia, unspecified: Secondary | ICD-10-CM

## 2015-06-10 HISTORY — PX: COLONOSCOPY: SHX5424

## 2015-06-10 HISTORY — PX: ESOPHAGOGASTRODUODENOSCOPY: SHX5428

## 2015-06-10 SURGERY — COLONOSCOPY
Anesthesia: Moderate Sedation

## 2015-06-10 MED ORDER — ONDANSETRON HCL 4 MG/2ML IJ SOLN
INTRAMUSCULAR | Status: DC | PRN
  Administered 2015-06-10: 4 mg via INTRAVENOUS

## 2015-06-10 MED ORDER — EPINEPHRINE HCL 0.1 MG/ML IJ SOSY
PREFILLED_SYRINGE | INTRAMUSCULAR | Status: AC
  Filled 2015-06-10: qty 10

## 2015-06-10 MED ORDER — SODIUM CHLORIDE 0.9 % IV SOLN
INTRAVENOUS | Status: DC
  Administered 2015-06-10: 09:00:00 via INTRAVENOUS

## 2015-06-10 MED ORDER — MEPERIDINE HCL 100 MG/ML IJ SOLN
INTRAMUSCULAR | Status: DC | PRN
  Administered 2015-06-10 (×2): 25 mg via INTRAVENOUS
  Administered 2015-06-10: 50 mg via INTRAVENOUS

## 2015-06-10 MED ORDER — SPOT INK MARKER SYRINGE KIT
PACK | SUBMUCOSAL | Status: DC | PRN
  Administered 2015-06-10: 7.5 mL via SUBMUCOSAL

## 2015-06-10 MED ORDER — ONDANSETRON HCL 4 MG/2ML IJ SOLN
INTRAMUSCULAR | Status: AC
  Filled 2015-06-10: qty 2

## 2015-06-10 MED ORDER — MIDAZOLAM HCL 5 MG/5ML IJ SOLN
INTRAMUSCULAR | Status: AC
  Filled 2015-06-10: qty 10

## 2015-06-10 MED ORDER — SODIUM CHLORIDE 0.9% FLUSH
INTRAVENOUS | Status: AC
  Filled 2015-06-10: qty 20

## 2015-06-10 MED ORDER — MIDAZOLAM HCL 5 MG/5ML IJ SOLN
INTRAMUSCULAR | Status: DC | PRN
Start: 2015-06-10 — End: 2015-06-10
  Administered 2015-06-10: 1 mg via INTRAVENOUS

## 2015-06-10 MED ORDER — MEPERIDINE HCL 100 MG/ML IJ SOLN
INTRAMUSCULAR | Status: AC
  Filled 2015-06-10: qty 2

## 2015-06-10 MED ORDER — STERILE WATER FOR IRRIGATION IR SOLN
Status: DC | PRN
  Administered 2015-06-10: 2.5 mL

## 2015-06-10 MED ORDER — LIDOCAINE VISCOUS 2 % MT SOLN
OROMUCOSAL | Status: DC | PRN
  Administered 2015-06-10: 1 via OROMUCOSAL

## 2015-06-10 MED ORDER — SODIUM CHLORIDE 0.9 % IJ SOLN
INTRAMUSCULAR | Status: DC | PRN
  Administered 2015-06-10: 9 mL via INTRAVENOUS

## 2015-06-10 MED ORDER — LIDOCAINE VISCOUS 2 % MT SOLN
OROMUCOSAL | Status: AC
  Filled 2015-06-10: qty 15

## 2015-06-10 MED ORDER — SODIUM CHLORIDE 0.9 % IJ SOLN
INTRAMUSCULAR | Status: DC | PRN
  Administered 2015-06-10: 8 mL

## 2015-06-10 MED ORDER — MIDAZOLAM HCL 5 MG/5ML IJ SOLN
INTRAMUSCULAR | Status: DC | PRN
  Administered 2015-06-10 (×4): 1 mg via INTRAVENOUS
  Administered 2015-06-10: 2 mg via INTRAVENOUS
  Administered 2015-06-10: 1 mg via INTRAVENOUS

## 2015-06-10 NOTE — H&P (View-Only) (Signed)
Primary Care Physician:  Wardell Honour, MD Primary Gastroenterologist:  Dr. Gala Romney  Chief Complaint  Patient presents with  . Blood In Stools    HPI:   Steven Drake is a 71 y.o. male who presents on referral from primary care for occult blood in stools. CT notes reviewed, last saw PCP related to this issue on 05/04/2015. At that point states decreasing hemoglobin from 11-10.1 over the past 4 months with noted heme positive stools on fecal occult blood test. No noted frank red blood, denied dizziness and lightheadedness at that time. Stated no colonoscopy in "many many years." No records of colonoscopy found in our system.  Today he states he has not seen any obvious blood. Has a history of an ulcer approximately 25 years on EGD, ago which healed with no recurrent problems. Has GERD, symptoms well controlled on PPI except if he eats high-fat foods. Last colonoscopy was over 10 years ago in [facility]. Denies abdominal pain, N/V, hematochezia, melena, unintentional weight loss, fever, chills. Has a bowel movement 1-2 times a day consistent with [place] 4. Denies acute changes in bowel habits or characteristics. Cardiologist had him stop Plavix until source of anemia and bleeding identified. Denies chest pain, dyspnea, dizziness, lightheadedness, syncope, near syncope. Denies any other upper or lower GI symptoms.  Past Medical History  Diagnosis Date  . GERD (gastroesophageal reflux disease)   . STEMI (ST elevation myocardial infarction), 01/19/13 01/19/2013  . CAD (coronary artery disease) RCA non dominant vessel, LAD 40%, 80-90% 2nd diag EF 55%  01/19/2013  . Hyperlipidemia LDL goal < 70 01/19/2013  . Pulmonary nodules 01/19/2013  . Hypokalemia 01/20/2013  . Tobacco abuse 01/20/2013  . Metabolic syndrome, with mildly elevated HgBA1C 01/20/2013    Past Surgical History  Procedure Laterality Date  . Appendectomy    . Left heart cath N/A 01/19/2013    Procedure: LEFT HEART  CATH;  Surgeon: Troy Sine, MD;  Location: [facility] CATH LAB;  Service: Cardiovascular;  Laterality: N/A;  . Hernia repair      Current Outpatient Prescriptions  Medication Sig Dispense Refill  . aspirin EC 81 MG EC tablet Take 1 tablet (81 mg total) by mouth daily.    Marland Kitchen atorvastatin (LIPITOR) 40 MG tablet TAKE 1 TABLET BY MOUTH EVERY DAY 90 tablet 0  . lisinopril (PRINIVIL,ZESTRIL) 5 MG tablet Take 1 tablet (5 mg total) by mouth daily. 90 tablet 3  . pantoprazole (PROTONIX) 40 MG tablet TAKE 1 TABLET BY MOUTH DAILY AT 6:00 A.M. 30 tablet 10  . clopidogrel (PLAVIX) 75 MG tablet TAKE 1 TABLET BY MOUTH DAILY WITH BREAKFAST (Patient not taking: Reported on 05/04/2015) 30 tablet 10   No current facility-administered medications for this visit.    Allergies as of 05/20/2015 - Review Complete 05/20/2015  Allergen Reaction Noted  . Contrast media [iodinated diagnostic agents]  05/20/2015    Family History  Problem Relation Age of Onset  . Cancer Father     colon  . Cancer Sister     colon    Social History   Social History  . Marital Status: Widowed    Spouse Name: N/A  . Number of Children: N/A  . Years of Education: N/A   Occupational History  . farmer    Social History Main Topics  . Smoking status: Current Every Day Smoker -- 1.00 packs/day for 58 years    Types: Cigarettes    Start date: 01/21/1963  . Smokeless tobacco: Never  Used     Comment: one pack daily  . Alcohol Use: No  . Drug Use: No  . Sexual Activity: Not on file   Other Topics Concern  . Not on file   Social History Narrative    Review of Systems: General: Negative for anorexia, weight loss, fever, chills, fatigue, weakness. Eyes: Negative for vision changes.  ENT: Negative for hoarseness, difficulty swallowing. CV: Negative for chest pain, angina, palpitations, peripheral edema.  Respiratory: Negative for dyspnea at rest, cough, sputum, wheezing.  GI: See history of present illness. Derm: Negative  for rash or itching.  Endo: Negative for unusual weight change.  Heme: Negative for bruising or bleeding. Allergy: Negative for rash or hives.    Physical Exam: BP 178/64 mmHg  Pulse 57  Temp(Src) 96.7 F (35.9 C) (Oral)  Ht 5\' 11"  (1.803 m)  Wt 175 lb 6.4 oz (79.561 kg)  BMI 24.47 kg/m2 General:   Alert and oriented. Pleasant and cooperative. Well-nourished and well-developed.  Head:  Normocephalic and atraumatic. Eyes:  Without icterus, sclera clear and conjunctiva pink.  Ears:  Normal auditory acuity. Cardiovascular:  S1, S2 present without murmurs appreciated. Extremities without clubbing or edema. Respiratory:  Clear to auscultation bilaterally. No wheezes, rales, or rhonchi. No distress.  Gastrointestinal:  +BS, soft, non-tender and non-distended. No HSM noted. No guarding or rebound. No masses appreciated.  Rectal:  Deferred  Musculoskalatal:  Symmetrical without gross deformities. Skin:  Intact without significant lesions or rashes. Neurologic:  Alert and oriented x4;  grossly normal neurologically. Psych:  Alert and cooperative. Normal mood and affect. Heme/Lymph/Immune: No excessive bruising noted.    05/20/2015 10:56 AM   Disclaimer: This note was dictated with voice recognition software. Similar sounding words can inadvertently be transcribed and may not be corrected upon review.

## 2015-06-10 NOTE — Op Note (Signed)
[facility] Patient Name: Steven Drake Procedure Date: 06/10/2015 10:06 AM MRN: [id number] Date of Birth: May 19, 1945 Attending MD: Norvel Richards , MD CSN: [id number] Age: 70 Admit Type: Outpatient Procedure:                Colonoscopy Indications:              Heme positive stool Providers:                Norvel Richards, MD, Lurline Del, RN, Isabella Stalling, Technician Referring MD:             Lillette Boxer. Miller Medicines:                Midazolam 8 mg IV, Meperidine 100 mg IV,                            Ondansetron 4 mg IV Complications:            No immediate complications. Estimated Blood Loss:     Estimated blood loss was minimal. Estimated blood                            loss was minimal. Procedure:                Pre-Anesthesia Assessment:                           - Prior to the procedure, a History and Physical                            was performed, and patient medications and                            allergies were reviewed. The patient's tolerance of                            previous anesthesia was also reviewed. The risks                            and benefits of the procedure and the sedation                            options and risks were discussed with the patient.                            All questions were answered, and informed consent                            was obtained. Prior Anticoagulants: The patient has                            taken no previous anticoagulant or antiplatelet  agents. ASA Grade Assessment: III - A patient with                            severe systemic disease. After reviewing the risks                            and benefits, the patient was deemed in                            satisfactory condition to undergo the procedure.                           After obtaining informed consent, the colonoscope                            was passed under direct vision.  Throughout the                            procedure, the patient's blood pressure, pulse, and                            oxygen saturations were monitored continuously. The                            EC-[id number] scope was introduced through                            the anus and advanced to the the cecum, identified                            by appendiceal orifice and ileocecal valve. The                            colonoscopy was performed without difficulty. The                            patient tolerated the procedure well. The quality                            of the bowel preparation was adequate. The                            ileocecal valve, appendiceal orifice, and rectum                            were photographed. Scope In: 10:37:04 AM Scope Out: 11:50:57 AM Scope Withdrawal Time: 1 hour 11 minutes 24 seconds  Total Procedure Duration: 1 hour 13 minutes 53 seconds  Findings:      Normal-appearing rectal mucosa including retroflexion. Patient had a 3.5       x 3 cm semi-pedunculated polypoid lesion mid sigmoid approximately 25-30       cm from the anal verge. Please see multiple photographs. The patient had       a 5 x 2 cm carpet polyp  sprawling over a fold at the hepatic flexure.       The patient had a exophytic, fungating appearing neoplastic process at       least 5-6 cm arising out of the base of the cecum. Please see above       photographs. Patient also started to have scattered pancolonic       diverticula.      The lesion in the cecum was biopsied multiple times with jumbo biopsy       forceps. The lesion at the hepatic flexure was lifted nicely off the       colonic wall with a total of 9 mL of normal saline injected       submucosally. Piecemeal hot snare polypectomy of lesion ensued. Multiple       fragments retrieved for the pathologist. Slight amount of residual       peripheral polyp tissue was ablated with the APC was circular probe on       right  colon setting. The sigmoid lesion was lifted away from the colonic       wall/stalk with with a total of 8 mL of 1 10,000 epinephrine. Piecemeal       hot snare polypectomy insued. This lesion was significantly debulked.       The central part of this lesion appeared to be somewhat infiltrating.       Tissue planes were not well defined. This lesion was not entirely       removed.      The mucosa 2 cm distal to the distal extent of this lesion was tattooed       with endo-spot. In addition, the hepatic flexure lesion was also       tattooed just distal to the distal extent. Again, the rectum and the       distal sigmoid mucosa appeared normal. Impression:               Cecal neoplasm?biopsied. Large polyp at hepatic                            flexure removed with piecemeal polypectomy and APC                            ablation. Lesion tattooed. Large polyp in the mid                            sigmoid status - post piecemeal hot snare debulking                            and tattooing.?This lesion not completely removed.                            Scattered pancolonic diverticulosis.- No specimens                            collected. Moderate Sedation:      Moderate (conscious) sedation was administered by the endoscopy nurse       and supervised by the endoscopist. The following parameters were       monitored: oxygen saturation, heart rate, blood pressure, respiratory       rate, EKG, adequacy of pulmonary ventilation, and  response to care.       Total physician intraservice time was 106 minutes. Recommendation:           Continue to stay off of Plavix for the time being.                            Follow up on pathology. Patient is going to need a                            surgery consultation in the near future. See EGD                            report.                           - Patient has a contact number available for                            emergencies. The signs and  symptoms of potential                            delayed complications were discussed with the                            patient. Return to normal activities tomorrow.                            Written discharge instructions were provided to the                            patient.                           - Clear liquid diet today.                           - Return to GI clinic in 1 week.                           - Return to GI clinic after studies are complete.                           - Repeat colonoscopy after studies are complete for                            surveillance.                           - Return to GI office after studies are complete.                           - Continue present medications. Procedure Code(s):        --- Professional ---                           [phone],  Colonoscopy, flexible; diagnostic, including                            collection of specimen(s) by brushing or washing,                            when performed (separate procedure)                           99152, Moderate sedation services provided by the                            same physician or other qualified health care                            professional performing the diagnostic or                            therapeutic service that the sedation supports,                            requiring the presence of an independent trained                            observer to assist in the monitoring of the                            patient's level of consciousness and physiological                            status; initial 15 minutes of intraservice time,                            patient age 97 years or older                           [phone], Moderate sedation services; each additional                            15 minutes intraservice time                           99153, Moderate sedation services; each additional                            15 minutes intraservice time                            99153, Moderate sedation services; each additional                            15 minutes intraservice time                           99153, Moderate sedation services; each additional  15 minutes intraservice time                           99153, Moderate sedation services; each additional                            15 minutes intraservice time Diagnosis Code(s):        --- Professional ---                           R19.5, Other fecal abnormalities CPT copyright 2016 [employer]. All rights reserved. The codes documented in this report are preliminary and upon coder review may  be revised to meet current compliance requirements. Cristopher Estimable. Selenia Mihok, MD Norvel Richards, MD 06/10/2015 12:24:33 PM This report has been signed electronically. Number of Addenda: 0

## 2015-06-10 NOTE — Discharge Instructions (Signed)
Colonoscopy Discharge Instructions  Read the instructions outlined below and refer to this sheet in the next few weeks. These discharge instructions provide you with general information on caring for yourself after you leave the hospital. Your doctor may also give you specific instructions. While your treatment has been planned according to the most current medical practices available, unavoidable complications occasionally occur. If you have any problems or questions after discharge, call Dr. Gala Drake at [phone]. ACTIVITY  You may resume your regular activity, but move at a slower pace for the next 24 hours.   Take frequent rest periods for the next 24 hours.   Walking will help get rid of the air and reduce the bloated feeling in your belly (abdomen).   No driving for 24 hours (because of the medicine (anesthesia) used during the test).    Do not sign any important legal documents or operate any machinery for 24 hours (because of the anesthesia used during the test).  NUTRITION  Drink plenty of fluids.   You may resume your normal diet as instructed by your doctor.   Begin with a light meal and progress to your normal diet. Heavy or fried foods are harder to digest and may make you feel sick to your stomach (nauseated).   Avoid alcoholic beverages for 24 hours or as instructed.  MEDICATIONS  You may resume your normal medications unless your doctor tells you otherwise.  WHAT YOU CAN EXPECT TODAY  Some feelings of bloating in the abdomen.   Passage of more gas than usual.   Spotting of blood in your stool or on the toilet paper.  IF YOU HAD POLYPS REMOVED DURING THE COLONOSCOPY:  No aspirin products for 7 days or as instructed.   No alcohol for 7 days or as instructed.   Eat a soft diet for the next 24 hours.  FINDING OUT THE RESULTS OF YOUR TEST Not all test results are available during your visit. If your test results are not back during the visit, make an appointment  with your caregiver to find out the results. Do not assume everything is normal if you have not heard from your caregiver or the medical facility. It is important for you to follow up on all of your test results.  SEEK IMMEDIATE MEDICAL ATTENTION IF:  You have more than a spotting of blood in your stool.   Your belly is swollen (abdominal distention).   You are nauseated or vomiting.   You have a temperature over 101.  You have abdominal pain or discomfort that is severe or gets worse throughout the day. EGD Discharge instructions Please read the instructions outlined below and refer to this sheet in the next few weeks. These discharge instructions provide you with general information on caring for yourself after you leave the hospital. Your doctor may also give you specific instructions. While your treatment has been planned according to the most current medical practices available, unavoidable complications occasionally occur. If you have any problems or questions after discharge, please call your doctor. ACTIVITY You may resume your regular activity but move at a slower pace for the next 24 hours.  Take frequent rest periods for the next 24 hours.  Walking will help expel (get rid of) the air and reduce the bloated feeling in your abdomen.  No driving for 24 hours (because of the anesthesia (medicine) used during the test).  You may shower.  Do not sign any important legal documents or operate any machinery for 24  hours (because of the anesthesia used during the test).  NUTRITION Drink plenty of fluids.  You may resume your normal diet.  Begin with a light meal and progress to your normal diet.  Avoid alcoholic beverages for 24 hours or as instructed by your caregiver.  MEDICATIONS You may resume your normal medications unless your caregiver tells you otherwise.  WHAT YOU CAN EXPECT TODAY You may experience abdominal discomfort such as a feeling of fullness or gas pains.   FOLLOW-UP Your doctor will discuss the results of your test with you.  SEEK IMMEDIATE MEDICAL ATTENTION IF ANY OF THE FOLLOWING OCCUR: Excessive nausea (feeling sick to your stomach) and/or vomiting.  Severe abdominal pain and distention (swelling).  Trouble swallowing.  Temperature over 101 F (37.8 C).  Rectal bleeding or vomiting of blood.     GERD information provided  Begin Protonix 40 mg daily  Colon polyp information provided  You had multiple large polyps in your colon. You will need to have surgery to remove one of them   You may see a little blood with your bowel movement movements over the next 24 hours. This should taper off. If if you see a steady amount of blood coming from your rectum you should come to the emergency department.  Clear liquid diet today and advance diet tomorrow as tolerated  Further recommendations to follow.  Do not resume Plavix      Gastroesophageal Reflux Disease, Adult Normally, food travels down the esophagus and stays in the stomach to be digested. However, when a person has gastroesophageal reflux disease (GERD), food and stomach acid move back up into the esophagus. When this happens, the esophagus becomes sore and inflamed. Over time, GERD can create small holes (ulcers) in the lining of the esophagus.  CAUSES This condition is caused by a problem with the muscle between the esophagus and the stomach (lower esophageal sphincter, or LES). Normally, the LES muscle closes after food passes through the esophagus to the stomach. When the LES is weakened or abnormal, it does not close properly, and that allows food and stomach acid to go back up into the esophagus. The LES can be weakened by certain dietary substances, medicines, and medical conditions, including:  Tobacco use.  Pregnancy.  Having a hiatal hernia.  Heavy alcohol use.  Certain foods and beverages, such as coffee, chocolate, onions, and peppermint. RISK FACTORS This  condition is more likely to develop in:  People who have an increased body weight.  People who have connective tissue disorders.  People who use NSAID medicines. SYMPTOMS Symptoms of this condition include:  Heartburn.  Difficult or painful swallowing.  The feeling of having a lump in the throat.  Abitter taste in the mouth.  Bad breath.  Having a large amount of saliva.  Having an upset or bloated stomach.  Belching.  Chest pain.  Shortness of breath or wheezing.  Ongoing (chronic) cough or a night-time cough.  Wearing away of tooth enamel.  Weight loss. Different conditions can cause chest pain. Make sure to see your health care provider if you experience chest pain. DIAGNOSIS Your health care provider will take a medical history and perform a physical exam. To determine if you have mild or severe GERD, your health care provider may also monitor how you respond to treatment. You may also have other tests, including:  An endoscopy toexamine your stomach and esophagus with a small camera.  A test thatmeasures the acidity level in your esophagus.  A  test thatmeasures how much pressure is on your esophagus.  A barium swallow or modified barium swallow to show the shape, size, and functioning of your esophagus. TREATMENT The goal of treatment is to help relieve your symptoms and to prevent complications. Treatment for this condition may vary depending on how severe your symptoms are. Your health care provider may recommend:  Changes to your diet.  Medicine.  Surgery. HOME CARE INSTRUCTIONS Diet  Follow a diet as recommended by your health care provider. This may involve avoiding foods and drinks such as:  Coffee and tea (with or without caffeine).  Drinks that containalcohol.  Energy drinks and sports drinks.  Carbonated drinks or sodas.  Chocolate and cocoa.  Peppermint and mint flavorings.  Garlic and onions.  Horseradish.  Spicy and  acidic foods, including peppers, chili powder, curry powder, vinegar, hot sauces, and barbecue sauce.  Citrus fruit juices and citrus fruits, such as oranges, lemons, and limes.  Tomato-based foods, such as red sauce, chili, salsa, and pizza with red sauce.  Fried and fatty foods, such as donuts, french fries, potato chips, and high-fat dressings.  High-fat meats, such as hot dogs and fatty cuts of red and white meats, such as rib eye steak, sausage, ham, and bacon.  High-fat dairy items, such as whole milk, butter, and cream cheese.  Eat small, frequent meals instead of large meals.  Avoid drinking large amounts of liquid with your meals.  Avoid eating meals during the 2-3 hours before bedtime.  Avoid lying down right after you eat.  Do not exercise right after you eat. General Instructions  Pay attention to any changes in your symptoms.  Take over-the-counter and prescription medicines only as told by your health care provider. Do not take aspirin, ibuprofen, or other NSAIDs unless your health care provider told you to do so.  Do not use any tobacco products, including cigarettes, chewing tobacco, and e-cigarettes. If you need help quitting, ask your health care provider.  Wear loose-fitting clothing. Do not wear anything tight around your waist that causes pressure on your abdomen.  Raise (elevate) the head of your bed 6 inches (15cm).  Try to reduce your stress, such as with yoga or meditation. If you need help reducing stress, ask your health care provider.  If you are overweight, reduce your weight to an amount that is healthy for you. Ask your health care provider for guidance about a safe weight loss goal.  Keep all follow-up visits as told by your health care provider. This is important. SEEK MEDICAL CARE IF:  You have new symptoms.  You have unexplained weight loss.  You have difficulty swallowing, or it hurts to swallow.  You have wheezing or a persistent  cough.  Your symptoms do not improve with treatment.  You have a hoarse voice. SEEK IMMEDIATE MEDICAL CARE IF:  You have pain in your arms, neck, jaw, teeth, or back.  You feel sweaty, dizzy, or light-headed.  You have chest pain or shortness of breath.  You vomit and your vomit looks like blood or coffee grounds.  You faint.  Your stool is bloody or black.  You cannot swallow, drink, or eat.   This information is not intended to replace advice given to you by your health care provider. Make sure you discuss any questions you have with your health care provider.   Document Released: 11/23/2004 Document Revised: 11/04/2014 Document Reviewed: 06/10/2014 Elsevier Interactive Patient Education 2016 Elsevier Inc[employer]   Colon Polyps Polyps are  lumps of extra tissue growing inside the body. Polyps can grow in the large intestine (colon). Most colon polyps are noncancerous (benign). However, some colon polyps can become cancerous over time. Polyps that are larger than a pea may be harmful. To be safe, caregivers remove and test all polyps. CAUSES  Polyps form when mutations in the genes cause your cells to grow and divide even though no more tissue is needed. RISK FACTORS There are a number of risk factors that can increase your chances of getting colon polyps. They include:  Being older than 50 years.  Family history of colon polyps or colon cancer.  Long-term colon diseases, such as colitis or Crohn disease.  Being overweight.  Smoking.  Being inactive.  Drinking too much alcohol. SYMPTOMS  Most small polyps do not cause symptoms. If symptoms are present, they may include:  Blood in the stool. The stool may look dark red or black.  Constipation or diarrhea that lasts longer than 1 week. DIAGNOSIS People often do not know they have polyps until their caregiver finds them during a regular checkup. Your caregiver can use 4 tests to check for polyps:  Digital rectal exam.  The caregiver wears gloves and feels inside the rectum. This test would find polyps only in the rectum.  Barium enema. The caregiver puts a liquid called barium into your rectum before taking X-rays of your colon. Barium makes your colon look white. Polyps are dark, so they are easy to see in the X-ray pictures.  Sigmoidoscopy. A thin, flexible tube (sigmoidoscope) is placed into your rectum. The sigmoidoscope has a light and tiny camera in it. The caregiver uses the sigmoidoscope to look at the last third of your colon.  Colonoscopy. This test is like sigmoidoscopy, but the caregiver looks at the entire colon. This is the most common method for finding and removing polyps. TREATMENT  Any polyps will be removed during a sigmoidoscopy or colonoscopy. The polyps are then tested for cancer. PREVENTION  To help lower your risk of getting more colon polyps:  Eat plenty of fruits and vegetables. Avoid eating fatty foods.  Do not smoke.  Avoid drinking alcohol.  Exercise every day.  Lose weight if recommended by your caregiver.  Eat plenty of calcium and folate. Foods that are rich in calcium include milk, cheese, and broccoli. Foods that are rich in folate include chickpeas, kidney beans, and spinach. HOME CARE INSTRUCTIONS Keep all follow-up appointments as directed by your caregiver. You may need periodic exams to check for polyps. SEEK MEDICAL CARE IF: You notice bleeding during a bowel movement.   This information is not intended to replace advice given to you by your health care provider. Make sure you discuss any questions you have with your health care provider.   Document Released: 11/10/2003 Document Revised: 03/06/2014 Document Reviewed: 04/25/2011 Elsevier Interactive Patient Education [employer].

## 2015-06-10 NOTE — Interval H&P Note (Signed)
History and Physical Interval Note:  06/10/2015 10:01 AM  Steven Drake  has presented today for surgery, with the diagnosis of ANEMIA/HEME POSITIVE STOOLS  The various methods of treatment have been discussed with the patient and family. After consideration of risks, benefits and other options for treatment, the patient has consented to  Procedure(s) with comments: COLONOSCOPY (N/A) - 1015 ESOPHAGOGASTRODUODENOSCOPY (EGD) (N/A) as a surgical intervention .  The patient's history has been reviewed, patient examined, no change in status, stable for surgery.  I have reviewed the patient's chart and labs.  Questions were answered to the patient's satisfaction.     Steven Drake  No change. Patient does relate intermittent esophageal dysphagia to solids. For EGD/ED and colonoscopy per plan.  The risks, benefits, limitations, imponderables and alternatives regarding both EGD and colonoscopy have been reviewed with the patient. Questions have been answered. All parties agreeable.

## 2015-06-10 NOTE — Op Note (Signed)
[facility] Patient Name: Steven Drake Procedure Date: 06/10/2015 10:07 AM MRN: [id number] Date of Birth: 1945-09-30 Attending MD: Norvel Richards , MD CSN: [id number] Age: 70 Admit Type: Outpatient Procedure:                Upper GI endoscopy Indications:              Dysphagia, Heme positive stool Providers:                Norvel Richards, MD, Lurline Del, RN, Isabella Stalling, Technician Referring MD:              Medicines:                Midazolam 3 mg IV, Meperidine 75 mg IV, Ondansetron                            4 mg IV Complications:            No immediate complications. Estimated Blood Loss:     Estimated blood loss was minimal. Procedure:                Pre-Anesthesia Assessment:                           - Prior to the procedure, a History and Physical                            was performed, and patient medications and                            allergies were reviewed. The patient's tolerance of                            previous anesthesia was also reviewed. The risks                            and benefits of the procedure and the sedation                            options and risks were discussed with the patient.                            All questions were answered, and informed consent                            was obtained. Prior Anticoagulants: The patient has                            taken no previous anticoagulant or antiplatelet                            agents. ASA Grade Assessment: II - A patient with  mild systemic disease. After reviewing the risks                            and benefits, the patient was deemed in                            satisfactory condition to undergo the procedure.                           After obtaining informed consent, the endoscope was                            passed under direct vision. Throughout the                            procedure, the  patient's blood pressure, pulse, and                            oxygen saturations were monitored continuously. The                            [id number] scope was introduced through the                            mouth, and advanced to the second part of duodenum.                            The upper GI endoscopy was accomplished without                            difficulty. The patient tolerated the procedure                            well. Scope In: 10:13:53 AM Scope Out: 10:26:27 AM Total Procedure Duration: 0 hours 12 minutes 34 seconds  Findings:      low-grade of narrowing Schatzki ring (acquired) was found at the       gastroesophageal junction. The scope was withdrawn. Dilation was       performed with a Maloney dilator with mild resistance at 56 Fr. The       dilation site was examined following endoscope reinsertion and showed       moderate improvement in luminal narrowing. Estimated blood loss was       minimal.      Stomach empty. Small hiatal hernia. Swollen prepyloric/pyloric channel       mucosa no obvious infiltrating process. Oral mucosa going down into the       pyloric channel. I did not discern a frank ulcer crater however area       suspicious. Patchy erythema and erosions of the gastric mucosa       diffusely. This was biopsied with a cold forceps for histology.       Estimated blood loss was minimal.      The second portion of the duodenum was normal.      The exam was otherwise without abnormality. Impression:               -  Low-grade of narrowing Schatzki ring. Dilated.                            Abnormal pylorus channel?"biopsied                           - Normal second portion of the duodenum.                           - The examination was otherwise normal. Moderate Sedation:      Moderate (conscious) sedation was administered by the endoscopy nurse       and supervised by the endoscopist. The following parameters were       monitored: oxygen  saturation, heart rate, blood pressure, respiratory       rate, EKG, adequacy of pulmonary ventilation, and response to care.       Total physician intraservice time was 17 minutes. Recommendation:           - Patient has a contact number available for                            emergencies. The signs and symptoms of potential                            delayed complications were discussed with the                            patient. Return to normal activities tomorrow.                            Written discharge instructions were provided to the                            patient.                           - Advance diet as tolerated.                           - Continue present medications.                           - Await pathology results.                           - Repeat upper endoscopy at appointment to be                            scheduled for surveillance based on pathology                            results. Begin Protonix 40 mg daily.                           - Return to GI clinic after studies are complete.  See colonoscopy report. Procedure Code(s):        --- Professional ---                           [phone], Esophagogastroduodenoscopy, flexible,                            transoral; with biopsy, single or multiple                           43450, Dilation of esophagus, by unguided sound or                            bougie, single or multiple passes                           99152, Moderate sedation services provided by the                            same physician or other qualified health care                            professional performing the diagnostic or                            therapeutic service that the sedation supports,                            requiring the presence of an independent trained                            observer to assist in the monitoring of the                            patient's level of consciousness  and physiological                            status; initial 15 minutes of intraservice time,                            patient age 43 years or older Diagnosis Code(s):        --- Professional ---                           K22.2, Esophageal obstruction                           R13.10, Dysphagia, unspecified                           R19.5, Other fecal abnormalities CPT copyright 2016 American Medical Association. All rights reserved. The codes documented in this report are preliminary and upon coder review may  be revised to meet current compliance requirements. Cristopher Estimable. Audrick Lamoureaux, MD Norvel Richards, MD 06/10/2015 10:35:10 AM This report has been signed electronically. Number of Addenda: 0

## 2015-06-14 ENCOUNTER — Telehealth: Payer: Self-pay | Admitting: Internal Medicine

## 2015-06-14 NOTE — Telephone Encounter (Signed)
Called pt back and they are aware we are waiting on pathology before any referral or further recommendations

## 2015-06-14 NOTE — Telephone Encounter (Signed)
Pt had procedure on 4/13 and called today asking about scheduling procedure about removing the larger polyp were we referring him to a surgeon for this? Please advise and call patient. [phone]

## 2015-06-15 ENCOUNTER — Other Ambulatory Visit: Payer: Self-pay

## 2015-06-15 ENCOUNTER — Telehealth: Payer: Self-pay | Admitting: Internal Medicine

## 2015-06-15 DIAGNOSIS — K319 Disease of stomach and duodenum, unspecified: Secondary | ICD-10-CM

## 2015-06-15 NOTE — Telephone Encounter (Signed)
Stanton Kidney called and pt is going to have procedure done on 04/20 @ 2:15pm.

## 2015-06-15 NOTE — Telephone Encounter (Signed)
thanks

## 2015-06-15 NOTE — Telephone Encounter (Signed)
Spoke with Wylene Simmer (friend) and she is understanding of findings. Told her that RMR wants her to proceed with EGD this week. She is aware of available times and will call be back when she verifies what time will work for the patient

## 2015-06-15 NOTE — Telephone Encounter (Signed)
Steven Drake came by office to pick up prep instructions for pt procedure  Routing to RMR to make him aware

## 2015-06-15 NOTE — Telephone Encounter (Signed)
Dr. Orene Desanctis, the pathologist called. Cecal lesion adenocarcinoma. Hepatic flexure lesion focal adenocarcinoma;  sigmoid polyp contains high grade dysplasia.  In addition,  the pyloric lesion is suspicious. Neuroendocrine hyperplasia present. Intestinal metaplasia present. Pathologic concerned about underlying adenocarcinoma but not quite diagnostic based on tissue submitted.  Patient is going to need a subtotal colectomy. Patient also needs to have a repeat EGD with additional biopsies of the abnormal pyloric channel area. I called all numbers in the medical record unable to reach the patient. We'll continue to have staff attempt to locate and get him set up for follow-up EGD with eventual surgical and oncology consultations.

## 2015-06-16 ENCOUNTER — Encounter (HOSPITAL_COMMUNITY): Payer: Self-pay | Admitting: Internal Medicine

## 2015-06-17 ENCOUNTER — Ambulatory Visit (HOSPITAL_COMMUNITY)
Admission: RE | Admit: 2015-06-17 | Discharge: 2015-06-17 | Disposition: A | Discharge status: Home / Self Care | Payer: Commercial Managed Care - HMO | Source: Ambulatory Visit | Attending: Internal Medicine | Admitting: Internal Medicine

## 2015-06-17 ENCOUNTER — Encounter (HOSPITAL_COMMUNITY)
Admission: RE | Disposition: A | Discharge status: Home / Self Care | Payer: Self-pay | Source: Ambulatory Visit | Attending: Internal Medicine

## 2015-06-17 ENCOUNTER — Encounter (HOSPITAL_COMMUNITY): Payer: Self-pay | Admitting: *Deleted

## 2015-06-17 ENCOUNTER — Other Ambulatory Visit: Payer: Self-pay

## 2015-06-17 DIAGNOSIS — F17210 Nicotine dependence, cigarettes, uncomplicated: Secondary | ICD-10-CM | POA: Insufficient documentation

## 2015-06-17 DIAGNOSIS — K219 Gastro-esophageal reflux disease without esophagitis: Secondary | ICD-10-CM | POA: Diagnosis not present

## 2015-06-17 DIAGNOSIS — Z791 Long term (current) use of non-steroidal anti-inflammatories (NSAID): Secondary | ICD-10-CM | POA: Insufficient documentation

## 2015-06-17 DIAGNOSIS — K2940 Chronic atrophic gastritis without bleeding: Secondary | ICD-10-CM | POA: Diagnosis not present

## 2015-06-17 DIAGNOSIS — K921 Melena: Secondary | ICD-10-CM | POA: Insufficient documentation

## 2015-06-17 DIAGNOSIS — Z7902 Long term (current) use of antithrombotics/antiplatelets: Secondary | ICD-10-CM | POA: Insufficient documentation

## 2015-06-17 DIAGNOSIS — C189 Malignant neoplasm of colon, unspecified: Secondary | ICD-10-CM | POA: Diagnosis not present

## 2015-06-17 DIAGNOSIS — K3189 Other diseases of stomach and duodenum: Secondary | ICD-10-CM | POA: Diagnosis not present

## 2015-06-17 DIAGNOSIS — Z79899 Other long term (current) drug therapy: Secondary | ICD-10-CM | POA: Diagnosis not present

## 2015-06-17 DIAGNOSIS — R933 Abnormal findings on diagnostic imaging of other parts of digestive tract: Secondary | ICD-10-CM | POA: Insufficient documentation

## 2015-06-17 DIAGNOSIS — C164 Malignant neoplasm of pylorus: Secondary | ICD-10-CM | POA: Diagnosis not present

## 2015-06-17 DIAGNOSIS — E785 Hyperlipidemia, unspecified: Secondary | ICD-10-CM | POA: Insufficient documentation

## 2015-06-17 DIAGNOSIS — Z7982 Long term (current) use of aspirin: Secondary | ICD-10-CM | POA: Insufficient documentation

## 2015-06-17 DIAGNOSIS — K319 Disease of stomach and duodenum, unspecified: Secondary | ICD-10-CM

## 2015-06-17 HISTORY — PX: ESOPHAGOGASTRODUODENOSCOPY: SHX5428

## 2015-06-17 SURGERY — EGD (ESOPHAGOGASTRODUODENOSCOPY)
Anesthesia: Moderate Sedation

## 2015-06-17 MED ORDER — MEPERIDINE HCL 100 MG/ML IJ SOLN
INTRAMUSCULAR | Status: AC
  Filled 2015-06-17: qty 2

## 2015-06-17 MED ORDER — MIDAZOLAM HCL 5 MG/5ML IJ SOLN
INTRAMUSCULAR | Status: AC
  Filled 2015-06-17: qty 10

## 2015-06-17 MED ORDER — MEPERIDINE HCL 100 MG/ML IJ SOLN
INTRAMUSCULAR | Status: DC | PRN
  Administered 2015-06-17: 50 mg via INTRAVENOUS
  Administered 2015-06-17: 25 mg via INTRAVENOUS

## 2015-06-17 MED ORDER — LIDOCAINE VISCOUS 2 % MT SOLN
OROMUCOSAL | Status: AC
  Filled 2015-06-17: qty 15

## 2015-06-17 MED ORDER — LIDOCAINE VISCOUS 2 % MT SOLN
OROMUCOSAL | Status: DC | PRN
  Administered 2015-06-17: 1 via OROMUCOSAL

## 2015-06-17 MED ORDER — ONDANSETRON HCL 4 MG/2ML IJ SOLN
INTRAMUSCULAR | Status: AC
  Filled 2015-06-17: qty 2

## 2015-06-17 MED ORDER — MIDAZOLAM HCL 5 MG/5ML IJ SOLN
INTRAMUSCULAR | Status: DC | PRN
  Administered 2015-06-17: 1 mg via INTRAVENOUS
  Administered 2015-06-17: 2 mg via INTRAVENOUS

## 2015-06-17 MED ORDER — SODIUM CHLORIDE 0.9 % IV SOLN
INTRAVENOUS | Status: DC
  Administered 2015-06-17: 13:00:00 via INTRAVENOUS

## 2015-06-17 NOTE — H&P (View-Only) (Signed)
Primary Care Physician:  Wardell Honour, MD Primary Gastroenterologist:  Dr. Gala Romney  Chief Complaint  Patient presents with  . Blood In Stools    HPI:   Steven Drake is a 70 y.o. male who presents on referral from primary care for occult blood in stools. CT notes reviewed, last saw PCP related to this issue on 05/04/2015. At that point states decreasing hemoglobin from 11-10.1 over the past 4 months with noted heme positive stools on fecal occult blood test. No noted frank red blood, denied dizziness and lightheadedness at that time. Stated no colonoscopy in "many many years." No records of colonoscopy found in our system.  Today he states he has not seen any obvious blood. Has a history of an ulcer approximately 25 years on EGD, ago which healed with no recurrent problems. Has GERD, symptoms well controlled on PPI except if he eats high-fat foods. Last colonoscopy was over 10 years ago in [facility]. Denies abdominal pain, N/V, hematochezia, melena, unintentional weight loss, fever, chills. Has a bowel movement 1-2 times a day consistent with [place] 4. Denies acute changes in bowel habits or characteristics. Cardiologist had him stop Plavix until source of anemia and bleeding identified. Denies chest pain, dyspnea, dizziness, lightheadedness, syncope, near syncope. Denies any other upper or lower GI symptoms.  Past Medical History  Diagnosis Date  . GERD (gastroesophageal reflux disease)   . STEMI (ST elevation myocardial infarction), 01/19/13 01/19/2013  . CAD (coronary artery disease) RCA non dominant vessel, LAD 40%, 80-90% 2nd diag EF 55%  01/19/2013  . Hyperlipidemia LDL goal < 70 01/19/2013  . Pulmonary nodules 01/19/2013  . Hypokalemia 01/20/2013  . Tobacco abuse 01/20/2013  . Metabolic syndrome, with mildly elevated HgBA1C 01/20/2013    Past Surgical History  Procedure Laterality Date  . Appendectomy    . Left heart cath N/A 01/19/2013    Procedure: LEFT HEART  CATH;  Surgeon: Troy Sine, MD;  Location: [facility] CATH LAB;  Service: Cardiovascular;  Laterality: N/A;  . Hernia repair      Current Outpatient Prescriptions  Medication Sig Dispense Refill  . aspirin EC 81 MG EC tablet Take 1 tablet (81 mg total) by mouth daily.    Marland Kitchen atorvastatin (LIPITOR) 40 MG tablet TAKE 1 TABLET BY MOUTH EVERY DAY 90 tablet 0  . lisinopril (PRINIVIL,ZESTRIL) 5 MG tablet Take 1 tablet (5 mg total) by mouth daily. 90 tablet 3  . pantoprazole (PROTONIX) 40 MG tablet TAKE 1 TABLET BY MOUTH DAILY AT 6:00 A.M. 30 tablet 10  . clopidogrel (PLAVIX) 75 MG tablet TAKE 1 TABLET BY MOUTH DAILY WITH BREAKFAST (Patient not taking: Reported on 05/04/2015) 30 tablet 10   No current facility-administered medications for this visit.    Allergies as of 05/20/2015 - Review Complete 05/20/2015  Allergen Reaction Noted  . Contrast media [iodinated diagnostic agents]  05/20/2015    Family History  Problem Relation Age of Onset  . Cancer Father     colon  . Cancer Sister     colon    Social History   Social History  . Marital Status: Widowed    Spouse Name: N/A  . Number of Children: N/A  . Years of Education: N/A   Occupational History  . farmer    Social History Main Topics  . Smoking status: Current Every Day Smoker -- 1.00 packs/day for 58 years    Types: Cigarettes    Start date: 01/21/1963  . Smokeless tobacco: Never  Used     Comment: one pack daily  . Alcohol Use: No  . Drug Use: No  . Sexual Activity: Not on file   Other Topics Concern  . Not on file   Social History Narrative    Review of Systems: General: Negative for anorexia, weight loss, fever, chills, fatigue, weakness. Eyes: Negative for vision changes.  ENT: Negative for hoarseness, difficulty swallowing. CV: Negative for chest pain, angina, palpitations, peripheral edema.  Respiratory: Negative for dyspnea at rest, cough, sputum, wheezing.  GI: See history of present illness. Derm: Negative  for rash or itching.  Endo: Negative for unusual weight change.  Heme: Negative for bruising or bleeding. Allergy: Negative for rash or hives.    Physical Exam: BP 178/64 mmHg  Pulse 57  Temp(Src) 96.7 F (35.9 C) (Oral)  Ht 5\' 11"  (1.803 m)  Wt 175 lb 6.4 oz (79.561 kg)  BMI 24.47 kg/m2 General:   Alert and oriented. Pleasant and cooperative. Well-nourished and well-developed.  Head:  Normocephalic and atraumatic. Eyes:  Without icterus, sclera clear and conjunctiva pink.  Ears:  Normal auditory acuity. Cardiovascular:  S1, S2 present without murmurs appreciated. Extremities without clubbing or edema. Respiratory:  Clear to auscultation bilaterally. No wheezes, rales, or rhonchi. No distress.  Gastrointestinal:  +BS, soft, non-tender and non-distended. No HSM noted. No guarding or rebound. No masses appreciated.  Rectal:  Deferred  Musculoskalatal:  Symmetrical without gross deformities. Skin:  Intact without significant lesions or rashes. Neurologic:  Alert and oriented x4;  grossly normal neurologically. Psych:  Alert and cooperative. Normal mood and affect. Heme/Lymph/Immune: No excessive bruising noted.    05/20/2015 10:56 AM   Disclaimer: This note was dictated with voice recognition software. Similar sounding words can inadvertently be transcribed and may not be corrected upon review.

## 2015-06-17 NOTE — Op Note (Signed)
[facility] Patient Name: Steven Drake Procedure Date: 06/17/2015 1:41 PM MRN: [id number] Date of Birth: 09-Nov-1945 Attending MD: Norvel Richards , MD CSN: [id number] Age: 70 Admit Type: Outpatient Procedure:                Upper GI endoscopy Indications:              Abnormal pylorus seen last week. Biopsies                            inconclusive for malignancy. Newly diagnosed colon                            cancer, Follow-up of achalasia Providers:                Norvel Richards, MD, Lurline Del, RN, Georgeann Oppenheim, Technician Referring MD:              Medicines:                Versed 3 mg IV and Demerol 75 mg IV in divided                            doses. Zofran 4 mg IV.                           Midazolam 3 mg IV Complications:            No immediate complications. Estimated Blood Loss:     Estimated blood loss was minimal. Procedure:                Pre-Anesthesia Assessment:                           - Prior to the procedure, a History and Physical                            was performed, and patient medications and                            allergies were reviewed. The patient's tolerance of                            previous anesthesia was also reviewed. The risks                            and benefits of the procedure and the sedation                            options and risks were discussed with the patient.                            All questions were answered, and informed consent  was obtained. Prior Anticoagulants: The patient has                            taken no previous anticoagulant or antiplatelet                            agents. ASA Grade Assessment: III - A patient with                            severe systemic disease. After reviewing the risks                            and benefits, the patient was deemed in                            satisfactory condition to undergo the  procedure.                           After obtaining informed consent, the endoscope was                            passed under direct vision. Throughout the                            procedure, the patient's blood pressure, pulse, and                            oxygen saturations were monitored continuously. The                            [id number] scope was introduced through the                            mouth, and advanced to the second part of duodenum.                            The upper GI endoscopy was accomplished without                            difficulty. The patient tolerated the procedure                            well. Scope In: 1:59:26 PM Scope Out: 2:07:39 PM Total Procedure Duration: 0 hours 8 minutes 13 seconds  Findings:      esophagus was normal.      Small hiatal hernia present. 2 cm nodular abnormality antrum/pyloric       channel. Please see multiple photographs. This was relatively hard       biopsy palpation. The remainder of the gastric mucosa appeared normal.       Pylorus remains patent. The first and second portion of the duodenum       appeared normal. Multiple biopsies of the abnormal antral/pyloric       channel area jumbo biopsy forceps.      Nodular mucosa was found in  the stomach. Impression:               - Normal esophagus.                           - Small hiatal hernia. Abnormal nodular                            antrum/pyloric channel?"status post biopsy.                           - No specimens collected. Moderate Sedation:      Moderate (conscious) sedation was administered by the endoscopy nurse       and supervised by the endoscopist. The following parameters were       monitored: oxygen saturation, heart rate, blood pressure, respiratory       rate, EKG, adequacy of pulmonary ventilation, and response to care.       Total physician intraservice time was 15 minutes. Recommendation:           - Patient has a contact number  available for                            emergencies. The signs and symptoms of potential                            delayed complications were discussed with the                            patient. Return to normal activities tomorrow.                            Written discharge instructions were provided to the                            patient.                           - Advance diet as tolerated.                           - Continue present medications.                           - Await pathology results. See Dr. Whitney Muse. New                            diagnosis of colon cancer. Procedure Code(s):        --- Professional ---                           [phone], Esophagogastroduodenoscopy, flexible,                            transoral; diagnostic, including collection of                            specimen(s) by brushing or washing,  when performed                            (separate procedure)                           99152, Moderate sedation services provided by the                            same physician or other qualified health care                            professional performing the diagnostic or                            therapeutic service that the sedation supports,                            requiring the presence of an independent trained                            observer to assist in the monitoring of the                            patient's level of consciousness and physiological                            status; initial 15 minutes of intraservice time,                            patient age 74 years or older Diagnosis Code(s):        --- Professional ---                           K31.89, Other diseases of stomach and duodenum CPT copyright 2016 American Medical Association. All rights reserved. The codes documented in this report are preliminary and upon coder review may  be revised to meet current compliance requirements. Cristopher Estimable. Rourk, MD Norvel Richards, MD 06/17/2015 2:31:33 PM This report has been signed electronically. Number of Addenda: 0

## 2015-06-17 NOTE — Interval H&P Note (Signed)
History and Physical Interval Note:  06/17/2015 1:48 PM  Steven Drake  has presented today for surgery, with the diagnosis of pyloric lesion  The various methods of treatment have been discussed with the patient and family. After consideration of risks, benefits and other options for treatment, the patient has consented to  Procedure(s) with comments: ESOPHAGOGASTRODUODENOSCOPY (EGD) (N/A) - 1430 as a surgical intervention .  The patient's history has been reviewed, patient examined, no change in status, stable for surgery.  I have reviewed the patient's chart and labs.  Questions were answered to the patient's satisfaction.      Since 3/23 consultation, see EGD and colonoscopy report. See telephone contact regarding pathology report. Patient now being brought back to further biopsy of lesion in the stomach as it is suspicious. No change since he was last seen here on 4/13, although patient states he swallowing much better since his esophagus was dilated.  The risks, benefits, limitations, alternatives and imponderables have been reviewed with the patient. Potential for esophageal dilation, biopsy, etc. have also been reviewed.  Questions have been answered. All parties agreeable.  Manus Rudd

## 2015-06-17 NOTE — Discharge Instructions (Signed)
EGD Discharge instructions Please read the instructions outlined below and refer to this sheet in the next few weeks. These discharge instructions provide you with general information on caring for yourself after you leave the hospital. Your doctor may also give you specific instructions. While your treatment has been planned according to the most current medical practices available, unavoidable complications occasionally occur. If you have any problems or questions after discharge, please call your doctor. ACTIVITY  You may resume your regular activity but move at a slower pace for the next 24 hours.   Take frequent rest periods for the next 24 hours.   Walking will help expel (get rid of) the air and reduce the bloated feeling in your abdomen.   No driving for 24 hours (because of the anesthesia (medicine) used during the test).   You may shower.   Do not sign any important legal documents or operate any machinery for 24 hours (because of the anesthesia used during the test).  NUTRITION  Drink plenty of fluids.   You may resume your normal diet.   Begin with a light meal and progress to your normal diet.   Avoid alcoholic beverages for 24 hours or as instructed by your caregiver.  MEDICATIONS  You may resume your normal medications unless your caregiver tells you otherwise.  WHAT YOU CAN EXPECT TODAY  You may experience abdominal discomfort such as a feeling of fullness or gas pains.  FOLLOW-UP  Your doctor will discuss the results of your test with you.  SEEK IMMEDIATE MEDICAL ATTENTION IF ANY OF THE FOLLOWING OCCUR:  Excessive nausea (feeling sick to your stomach) and/or vomiting.   Severe abdominal pain and distention (swelling).   Trouble swallowing.   Temperature over 101 F (37.8 C).   Rectal bleeding or vomiting of blood.    Appointment see Dr. Whitney Muse in regards to newly diagnosed colon cancer  Further recommendations to follow regarding today's biopsies  once I get the report back to review.    RESTART PLAVIX ON April 27th, 2017.

## 2015-06-21 ENCOUNTER — Encounter (HOSPITAL_COMMUNITY): Payer: Commercial Managed Care - HMO | Attending: Hematology & Oncology | Admitting: Hematology & Oncology

## 2015-06-21 ENCOUNTER — Encounter (HOSPITAL_COMMUNITY): Payer: Self-pay | Admitting: Hematology & Oncology

## 2015-06-21 VITALS — BP 143/66 | HR 65 | Temp 98.0°F | Resp 18 | Ht 69.0 in | Wt 172.8 lb

## 2015-06-21 DIAGNOSIS — C189 Malignant neoplasm of colon, unspecified: Secondary | ICD-10-CM | POA: Diagnosis not present

## 2015-06-21 DIAGNOSIS — D649 Anemia, unspecified: Secondary | ICD-10-CM | POA: Diagnosis not present

## 2015-06-21 DIAGNOSIS — Z720 Tobacco use: Secondary | ICD-10-CM

## 2015-06-21 DIAGNOSIS — R918 Other nonspecific abnormal finding of lung field: Secondary | ICD-10-CM | POA: Insufficient documentation

## 2015-06-21 DIAGNOSIS — C182 Malignant neoplasm of ascending colon: Secondary | ICD-10-CM

## 2015-06-21 DIAGNOSIS — C180 Malignant neoplasm of cecum: Secondary | ICD-10-CM | POA: Diagnosis not present

## 2015-06-21 DIAGNOSIS — C169 Malignant neoplasm of stomach, unspecified: Secondary | ICD-10-CM

## 2015-06-21 LAB — IRON AND TIBC
Iron: 13 ug/dL — ABNORMAL LOW (ref 45–182)
SATURATION RATIOS: 4 % — AB (ref 17.9–39.5)
TIBC: 344 ug/dL (ref 250–450)
UIBC: 331 ug/dL

## 2015-06-21 LAB — FOLATE: FOLATE: 15.2 ng/mL (ref >5.9)

## 2015-06-21 LAB — FERRITIN: FERRITIN: 13 ng/mL — AB (ref 24–336)

## 2015-06-21 NOTE — Progress Notes (Signed)
[name]'s reason for visit today is for labs as scheduled per MD orders.  Venipuncture performed with a 23 gauge butterfly needle to Steven Drake tolerated procedure well and without incident; questions were answered and patient was discharged.

## 2015-06-21 NOTE — Patient Instructions (Addendum)
[place] at [facility] Discharge Instructions  RECOMMENDATIONS MADE BY THE CONSULTANT AND ANY TEST RESULTS WILL BE SENT TO YOUR REFERRING PHYSICIAN.    Exam and discussion by Dr Whitney Muse today Lab work today, we will call you with those results  We are going to place you on GI tumor board next week. PET scan scheduled Return to see the doctor after your PET scan Please call the clinic if you have any questions or concerns      Thank you for choosing [place] at [facility] to provide your oncology and hematology care.  To afford each patient quality time with our provider, please arrive at least 15 minutes before your scheduled appointment time.   Beginning January 23rd 2017 lab work for the [facility] will be done in the  Main lab at [facility] on 1st floor. If you have a lab appointment with the [facility] please come in thru the  Main Entrance and check in at the main information desk  You need to re-schedule your appointment should you arrive 10 or more minutes late.  We strive to give you quality time with our providers, and arriving late affects you and other patients whose appointments are after yours.  Also, if you no show three or more times for appointments you may be dismissed from the clinic at the providers discretion.     Again, thank you for choosing [facility].  Our hope is that these requests will decrease the amount of time that you wait before being seen by our physicians.       _____________________________________________________________  Should you have questions after your visit to [facility], please contact our office at [phone] between the hours of 8:30 a.m. and 4:30 p.m.  Voicemails left after 4:30 p.m. will not be returned until the following business day.  For prescription refill requests, have your pharmacy contact our office.         Resources For Cancer  Patients and their Caregivers ? American Cancer Society: Can assist with transportation, wigs, general needs, runs Look Good Feel Better.        334 556 7281 ? Cancer Care: Provides financial assistance, online support groups, medication/co-pay assistance.  1-800-813-HOPE 367-739-4036) ? [facility] Assists [place] cancer patients and their families through emotional , educational and financial support.  901 378 0163 ? [place] DSS Where to apply for food stamps, Medicaid and utility assistance. 479-698-1970 ? [facility]: Transportation to medical appointments. (236)152-9512 ? Social Security Administration: May apply for disability if have a Stage IV cancer. 321-224-2703 [phone])081-9945 ? [employer], Disability and Transit Services: Assists with nutrition, care and transit needs. 646-665-5303

## 2015-06-21 NOTE — Progress Notes (Signed)
[place]  Progress Note  Patient Care Team: Worthy Rancher, MD as PCP - General (Family Medicine) Daneil Dolin, MD as Consulting Physician (Gastroenterology)  CHIEF COMPLAINTS/PURPOSE OF CONSULTATION:    Gastric adenocarcinoma [facility])   06/17/2015 Procedure Stomach biopsy of pyloric lesion, Dr. Gala Romney   06/10/2015 Pathology Results SENT FOR SECONDARY PATH REVIEW, suspicious for adenocarcinoma         Colon cancer, ascending ([place])   06/10/2015 Procedure colonoscopy with Dr. Sydell Axon, Cecal neoplasm. large polyp at hepatic flexure. lesion tattooed, large polyp in mid sigmoid (lesion not completely removed)   06/10/2015 Pathology Results cecal mass - adenocarcinoma, polyp at hepatic flexure foci of adenocarcinoma arising from tubulovillous adenoma, polyp sigmoid - tubulovillous adenoma with at least high grade dysplasia cannot rule out focal adenocarcinoma    HISTORY OF PRESENTING ILLNESS:  Steven Drake 70 y.o. male is here because of newly diagnosed cecal carcinoma, several polyps with high grade dysplasia/foci of adenocarcinoma and stomach biopsy suggestive of adenocarcinoma. He is a heavy smoker. He has a history of pulmonary nodules followed since at least 2014 with overall stability. Recent CT of the chest on 05/14/2015 at [facility] showed predominantly upper lobe pulmonary nodules, increased in number in the RUL and LUL, stability in prior nodules, nonspecific findings.   [name] was here today with two family friends.  He lives alone. He has been widowed for many years. 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 He remarks on multiple occasions that it will soon be time to bring in his hay.  He said that he ate well until he had his colonoscopy. He has not lost a lot of weight in the last few months. He denies change in appetite. He was referred to GI by Dr. Warrick Parisian at [facility]  secondary to progressive anemia. Colonoscopy and EGD revealed the findings as detailed above.   He asked how long he is going to be down after his surgery. He asked when he would be able to ride his tractor.   He is here today for additional recommendations and ongoing care.   MEDICAL HISTORY:  Past Medical History  Diagnosis Date  . GERD (gastroesophageal reflux disease)   . STEMI (ST elevation myocardial infarction), 01/19/13 01/19/2013  . CAD (coronary artery disease) RCA non dominant vessel, LAD 40%, 80-90% 2nd diag EF 55%  01/19/2013  . Hyperlipidemia LDL goal < 70 01/19/2013  . Pulmonary nodules 01/19/2013  . Hypokalemia 01/20/2013  . Tobacco abuse 01/20/2013  . Metabolic syndrome, with mildly elevated HgBA1C 01/20/2013    SURGICAL HISTORY: Past Surgical History  Procedure Laterality Date  . Appendectomy    . Left heart cath N/A 01/19/2013    Procedure: LEFT HEART CATH;  Surgeon: Troy Sine, MD;  Location: [facility] CATH LAB;  Service: Cardiovascular;  Laterality: N/A;  . Hernia repair    . Colonoscopy N/A 06/10/2015    Procedure: COLONOSCOPY;  Surgeon: Daneil Dolin, MD;  Location: [facility] ENDO SUITE;  Service: Endoscopy;  Laterality: N/A;  1015  . Esophagogastroduodenoscopy N/A 06/10/2015    Procedure: ESOPHAGOGASTRODUODENOSCOPY (EGD);  Surgeon: Daneil Dolin, MD;  Location: [facility] ENDO SUITE;  Service: Endoscopy;  Laterality: N/A;    SOCIAL HISTORY: Social History   Social History  . Marital Status: Widowed    Spouse Name: N/A  . Number of Children: N/A  . Years of Education: N/A   Occupational  History  . farmer    Social History Main Topics  . Smoking status: Current Every Day Smoker -- 1.00 packs/day for 58 years    Types: Cigarettes    Start date: 01/21/1963  . Smokeless tobacco: Never Used     Comment: one pack daily  . Alcohol Use: No  . Drug Use: No  . Sexual Activity: Not on file   Other Topics Concern  . Not on file   Social History Narrative  Widowed  since 2003 Has 2 step kids Still smokes No ETOH use Retired. Used to drive a truck cross country for 45 years.   FAMILY HISTORY: Family History  Problem Relation Age of Onset  . Cancer Father     colon  . Cancer Sister     colon  1 brother and 1 sister living Oldest brother died; sister died from stomach cancer; Other sister died of colon cancer Father died of colon cancer at 54 years old. Mother died of a heart attack in her 32s.  ALLERGIES:  is allergic to contrast media.  MEDICATIONS:  Current Outpatient Prescriptions  Medication Sig Dispense Refill  . atorvastatin (LIPITOR) 40 MG tablet TAKE 1 TABLET BY MOUTH EVERY DAY 90 tablet 0  . lisinopril (PRINIVIL,ZESTRIL) 5 MG tablet Take 1 tablet (5 mg total) by mouth daily. 90 tablet 3  . pantoprazole (PROTONIX) 40 MG tablet TAKE 1 TABLET BY MOUTH DAILY AT 6:00 A.M. 30 tablet 10  . aspirin EC 81 MG EC tablet Take 1 tablet (81 mg total) by mouth daily. (Patient not taking: Reported on 06/21/2015)    . tamsulosin (FLOMAX) 0.4 MG CAPS capsule Take 1 capsule (0.4 mg total) by mouth daily. (Patient not taking: Reported on 06/21/2015) 30 capsule 3   No current facility-administered medications for this visit.    Review of Systems  Constitutional: Negative.  Negative for fever, chills, weight loss and malaise/fatigue.  HENT: Negative.  Negative for congestion, hearing loss, nosebleeds, sore throat and tinnitus.   Eyes: Negative.  Negative for blurred vision, double vision, pain and discharge.  Respiratory: Negative.  Negative for cough, hemoptysis, sputum production, shortness of breath and wheezing.   Cardiovascular: Negative.  Negative for chest pain, palpitations, claudication, leg swelling and PND.  Gastrointestinal: Negative.  Negative for heartburn, nausea, vomiting, abdominal pain, diarrhea, constipation, blood in stool and melena.  Genitourinary: Negative.  Negative for dysuria, urgency, frequency and hematuria.  Musculoskeletal:  Negative.  Negative for myalgias, joint pain and falls.  Skin: Negative.  Negative for itching and rash.  Neurological: Negative.  Negative for dizziness, tingling, tremors, sensory change, speech change, focal weakness, seizures, loss of consciousness, weakness and headaches.  Endo/Heme/Allergies: Negative.  Does not bruise/bleed easily.  Psychiatric/Behavioral: Negative.  Negative for depression, suicidal ideas, memory loss and substance abuse. The patient is not nervous/anxious and does not have insomnia.   All other systems reviewed and are negative.  14 point ROS was done and is otherwise as detailed above or in HPI   PHYSICAL EXAMINATION: ECOG PERFORMANCE STATUS: 0 - Asymptomatic  Filed Vitals:   06/21/15 1439  BP: 143/66  Pulse: 65  Temp: 98 F (36.7 C)  Resp: 18   Filed Weights   06/21/15 1439  Weight: 172 lb 12.8 oz (78.382 kg)    Physical Exam  Constitutional: He is oriented to person, place, and time and well-developed, well-nourished, and in no distress.  HENT:  Head: Normocephalic and atraumatic.  Nose: Nose normal.  Mouth/Throat: Oropharynx is clear  and moist. No oropharyngeal exudate.  Eyes: Conjunctivae and EOM are normal. Pupils are equal, round, and reactive to light. Right eye exhibits no discharge. Left eye exhibits no discharge. No scleral icterus.  Neck: Normal range of motion. Neck supple. No tracheal deviation present. No thyromegaly present.  Cardiovascular: Normal rate, regular rhythm and normal heart sounds.  Exam reveals no gallop and no friction rub.   No murmur heard. Pulmonary/Chest: Effort normal and breath sounds normal. He has no wheezes. He has no rales.  Abdominal: Soft. Bowel sounds are normal. He exhibits no distension and no mass. There is no tenderness. There is no rebound and no guarding.  Musculoskeletal: Normal range of motion. He exhibits no edema.  Lymphadenopathy:    He has no cervical adenopathy.  Neurological: He is alert and  oriented to person, place, and time. He has normal reflexes. No cranial nerve deficit. Gait normal. Coordination normal.  Skin: Skin is warm and dry. No rash noted.  Psychiatric: Mood, memory, affect and judgment normal.  Nursing note and vitals reviewed.   LABORATORY DATA:  I have reviewed the data as listed Lab Results  Component Value Date   WBC 9.1 05/20/2015   HGB 10.2* 05/20/2015   HCT 31.9* 05/20/2015   MCV 88.1 05/20/2015   PLT 232 05/20/2015   CMP     Component Value Date/Time   NA 138 04/13/2015 1252   K 4.7 04/13/2015 1252   CL 107 04/13/2015 1252   CO2 25 04/13/2015 1252   GLUCOSE 108* 04/13/2015 1252   BUN 17 04/13/2015 1252   CREATININE 1.32* 04/13/2015 1252   CREATININE 1.05 01/21/2013 0510   CALCIUM 8.6 04/13/2015 1252   PROT 6.4 04/13/2015 1252   ALBUMIN 3.7 04/13/2015 1252   AST 14 04/13/2015 1252   ALT 8* 04/13/2015 1252   ALKPHOS 107 04/13/2015 1252   BILITOT 0.3 04/13/2015 1252   GFRNONAA 70* 01/21/2013 0510   GFRAA 82* 01/21/2013 0510     RADIOGRAPHIC STUDIES: I have personally reviewed the radiological images as listed and agreed with the findings in the report. No results found.     PATHOLOGY:   ASSESSMENT & PLAN:  Cecal adenocarcinoma Sigmoid Polyp with high grade dysplasia Polyp at hepatic flexure with foci of adenocarcinoma Gastric biopsy suggestive of adenocarcinoma (sent for second path review) Pulmonary nodules Anemia Tobacco Abuse  We discussed the multiple issues today and the complexity of the findings. He has a cecal mass but also several polyps with foci of adenocarcinoma. Gastric biopsy is also highly suggestive of adenocarcinoma.  Pulmonary nodules to date have been stable up until recent imaging but overall appear to be nonspecific.    Have recommended we proceed as follows: 1. PET/CT for additional evaluation and to rule out metastatic disease 2. Patient will need surgical referral, preferentially with surgical  oncologist 3. Eventual Genetics referral 4. Labs as detailed below including CEA 5. Smoking cessation was discussed in detail. He notes however that he is not interested in quitting at this time.  6. Follow-up with me post imaging and additional recommendations will be made regarding therapy, referrals etc. Path review of his gastric biopsy will also be available for further discusssion.  Orders Placed This Encounter  Procedures  . CEA    Standing Status: Future     Number of Occurrences: 1     Standing Expiration Date: 06/20/2016  . Iron and TIBC    Standing Status: Future     Number of Occurrences: 1  Standing Expiration Date: 06/20/2016  . Ferritin    Standing Status: Future     Number of Occurrences: 1     Standing Expiration Date: 06/20/2016  . Folate    Standing Status: Future     Number of Occurrences: 1     Standing Expiration Date: 06/20/2016   All questions were answered. The patient knows to call the clinic with any problems, questions or concerns.  This document serves as a record of services personally performed by Ancil Linsey, MD. It was created on her behalf by Kandace Blitz, a trained medical scribe. The creation of this record is based on the scribe's personal observations and the provider's statements to them. This document has been checked and approved by the attending provider.  I have reviewed the above documentation for accuracy and completeness, and I agree with the above.  This note was electronically signed.    Molli Hazard, MD  06/21/2015 4:10 PM

## 2015-06-22 ENCOUNTER — Telehealth: Payer: Self-pay | Admitting: Internal Medicine

## 2015-06-22 LAB — CEA: CEA: 8 ng/mL — ABNORMAL HIGH (ref 0.0–4.7)

## 2015-06-22 NOTE — Telephone Encounter (Signed)
I spoke to Dr. Tillman Sers, the pathologist, regarding repeat gastric biopsies in this patient. Again, biopsy suspicious for adenocarcinoma but inconclusive. Dr. Tillman Sers is sending the slides to Dr. Antony Salmon, an expert at the [facility] for second opinion. Turnaround time will be approximately 5-7 days from now.

## 2015-06-23 ENCOUNTER — Telehealth (HOSPITAL_COMMUNITY): Payer: Self-pay | Admitting: Hematology & Oncology

## 2015-06-23 ENCOUNTER — Ambulatory Visit: Payer: Commercial Managed Care - HMO | Admitting: Family Medicine

## 2015-06-23 NOTE — Telephone Encounter (Signed)
PC FOR [facility] STATING PT NEEDED TO CORRECT HIS PCP THAT HE HAS LISTED ON HIS POLICY. CONTACTED PTS CAREGIVER TO NOTIFY HER OF THE CHANGES THAT NEED TO BE MADE

## 2015-06-25 ENCOUNTER — Encounter (HOSPITAL_COMMUNITY): Payer: Self-pay

## 2015-06-25 ENCOUNTER — Telehealth: Payer: Self-pay | Admitting: Internal Medicine

## 2015-06-25 NOTE — Telephone Encounter (Signed)
Dr. 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  No HP on histology.  I called pt-he was unavailable.  I did speak to his caregiver and informed her of diagnosis.  Pt seeing Dr. Whitney Muse.  PET scheduled

## 2015-06-25 NOTE — Telephone Encounter (Signed)
cm

## 2015-06-25 NOTE — Telephone Encounter (Signed)
Per RMR we are waiting on Bx results

## 2015-06-26 ENCOUNTER — Other Ambulatory Visit: Payer: Self-pay | Admitting: Cardiovascular Disease

## 2015-06-28 ENCOUNTER — Ambulatory Visit (HOSPITAL_COMMUNITY)
Admission: RE | Admit: 2015-06-28 | Discharge: 2015-06-28 | Disposition: A | Discharge status: Home / Self Care | Payer: Commercial Managed Care - HMO | Source: Ambulatory Visit | Attending: Hematology & Oncology | Admitting: Hematology & Oncology

## 2015-06-28 DIAGNOSIS — R918 Other nonspecific abnormal finding of lung field: Secondary | ICD-10-CM | POA: Diagnosis not present

## 2015-06-28 DIAGNOSIS — C189 Malignant neoplasm of colon, unspecified: Secondary | ICD-10-CM | POA: Insufficient documentation

## 2015-06-28 DIAGNOSIS — I2510 Atherosclerotic heart disease of native coronary artery without angina pectoris: Secondary | ICD-10-CM | POA: Insufficient documentation

## 2015-06-28 DIAGNOSIS — D649 Anemia, unspecified: Secondary | ICD-10-CM | POA: Diagnosis not present

## 2015-06-28 DIAGNOSIS — K6389 Other specified diseases of intestine: Secondary | ICD-10-CM | POA: Diagnosis not present

## 2015-06-28 LAB — GLUCOSE, CAPILLARY: Glucose-Capillary: 109 mg/dL — ABNORMAL HIGH (ref 65–99)

## 2015-06-28 MED ORDER — FLUDEOXYGLUCOSE F - 18 (FDG) INJECTION
9.7300 | Freq: Once | INTRAVENOUS | Status: AC | PRN
  Administered 2015-06-28: 9.73 via INTRAVENOUS

## 2015-06-29 ENCOUNTER — Encounter (HOSPITAL_COMMUNITY): Payer: Self-pay | Admitting: Oncology

## 2015-06-29 ENCOUNTER — Encounter (HOSPITAL_COMMUNITY): Payer: Self-pay | Admitting: Hematology & Oncology

## 2015-06-29 ENCOUNTER — Encounter (HOSPITAL_COMMUNITY): Payer: Commercial Managed Care - HMO | Attending: Hematology & Oncology | Admitting: Hematology & Oncology

## 2015-06-29 ENCOUNTER — Encounter (HOSPITAL_COMMUNITY): Payer: Self-pay | Admitting: Lab

## 2015-06-29 DIAGNOSIS — Z720 Tobacco use: Secondary | ICD-10-CM

## 2015-06-29 DIAGNOSIS — R918 Other nonspecific abnormal finding of lung field: Secondary | ICD-10-CM | POA: Diagnosis not present

## 2015-06-29 DIAGNOSIS — C182 Malignant neoplasm of ascending colon: Secondary | ICD-10-CM

## 2015-06-29 DIAGNOSIS — C169 Malignant neoplasm of stomach, unspecified: Secondary | ICD-10-CM | POA: Insufficient documentation

## 2015-06-29 DIAGNOSIS — D649 Anemia, unspecified: Secondary | ICD-10-CM | POA: Diagnosis not present

## 2015-06-29 DIAGNOSIS — C180 Malignant neoplasm of cecum: Secondary | ICD-10-CM

## 2015-06-29 DIAGNOSIS — C189 Malignant neoplasm of colon, unspecified: Secondary | ICD-10-CM | POA: Insufficient documentation

## 2015-06-29 HISTORY — DX: Malignant neoplasm of stomach, unspecified: C16.9

## 2015-06-29 NOTE — Progress Notes (Signed)
[place]  Progress Note  Patient Care Team: Worthy Rancher, MD as PCP - General (Family Medicine) Daneil Dolin, MD as Consulting Physician (Gastroenterology)  CHIEF COMPLAINTS/PURPOSE OF CONSULTATION:    Gastric adenocarcinoma [facility])   06/17/2015 Procedure Stomach biopsy of pyloric lesion, Dr. 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   06/28/2015 PET scan Intensely hypermetabolic cecal mass. No definitive evidence of metastatic disease. Scattered upper lobe predominant pulmonary nodules are too small for PET resolution.     Colon cancer, ascending ([place])   06/10/2015 Procedure colonoscopy with Dr. Sydell Axon, Cecal neoplasm. large polyp at hepatic flexure. lesion tattooed, large polyp in mid sigmoid (lesion not completely removed)   06/10/2015 Pathology Results cecal mass - adenocarcinoma, polyp at hepatic flexure foci of adenocarcinoma arising from tubulovillous adenoma, polyp sigmoid - tubulovillous adenoma with at least high grade dysplasia cannot rule out focal adenocarcinoma    HISTORY OF PRESENTING ILLNESS:  Steven Drake 70 y.o. male is here for further follow-up of newly diagnosed cecal carcinoma, several polyps with high grade dysplasia/foci of adenocarcinoma and stomach biopsy c/w adenocarcinoma  Steven Drake returns to the [place] today accompanied by his neighbors.   We reviewed his PET/CT. Pulmonary nodules are not hypermetabolic. We discussed the findings in detail. Cecal mass is obvious.   He is concerned about being out of commission during his surgery, and that mowing and raking the hay will get behind while he is trying to pursue treatment. His friends again reminded him that he has people to help. Taking care of himself was again addressed.   After musing on it for a bit, he says "go ahead and set it  up."   MEDICAL HISTORY:  Past Medical History  Diagnosis Date  . GERD (gastroesophageal reflux disease)   . STEMI (ST elevation myocardial infarction), 01/19/13 01/19/2013  . CAD (coronary artery disease) RCA non dominant vessel, LAD 40%, 80-90% 2nd diag EF 55%  01/19/2013  . Hyperlipidemia LDL goal < 70 01/19/2013  . Pulmonary nodules 01/19/2013  . Hypokalemia 01/20/2013  . Tobacco abuse 01/20/2013  . Metabolic syndrome, with mildly elevated HgBA1C 01/20/2013  . Gastric adenocarcinoma ([place]) 06/29/2015    SURGICAL HISTORY: Past Surgical History  Procedure Laterality Date  . Appendectomy    . Left heart cath N/A 01/19/2013    Procedure: LEFT HEART CATH;  Surgeon: Troy Sine, MD;  Location: [facility] CATH LAB;  Service: Cardiovascular;  Laterality: N/A;  . Hernia repair    . Colonoscopy N/A 06/10/2015    Procedure: COLONOSCOPY;  Surgeon: Daneil Dolin, MD;  Location: [facility] ENDO SUITE;  Service: Endoscopy;  Laterality: N/A;  1015  . Esophagogastroduodenoscopy N/A 06/10/2015    Procedure: ESOPHAGOGASTRODUODENOSCOPY (EGD);  Surgeon: Daneil Dolin, MD;  Location: [facility] ENDO SUITE;  Service: Endoscopy;  Laterality: N/A;  . Esophagogastroduodenoscopy N/A 06/17/2015    Procedure: ESOPHAGOGASTRODUODENOSCOPY (EGD);  Surgeon: Daneil Dolin, MD;  Location: [facility] ENDO SUITE;  Service: Endoscopy;  Laterality: N/A;  1430    SOCIAL HISTORY: Social History   Social History  . Marital Status: Widowed    Spouse Name: N/A  . Number of Children: N/A  . Years of Education: N/A   Occupational History  . farmer    Social History Main Topics  . Smoking status: Current Every Day Smoker -- 1.00 packs/day for 58 years    Types:  Cigarettes    Start date: 01/21/1963  . Smokeless tobacco: Never Used     Comment: one pack daily  . Alcohol Use: No  . Drug Use: No  . Sexual Activity: Not on file   Other Topics Concern  . Not on file   Social History Narrative  Widowed since 2003 Has 2 step kids Still  smokes No ETOH use Retired. Used to drive a truck cross country for 45 years.   FAMILY HISTORY: Family History  Problem Relation Age of Onset  . Cancer Father     colon  . Cancer Sister     colon  1 brother and 1 sister living Oldest brother died; sister died from stomach cancer; Other sister died of colon cancer Father died of colon cancer at 57 years old. Mother died of a heart attack in her 91s.  ALLERGIES:  is allergic to contrast media.  MEDICATIONS:  Current Outpatient Prescriptions  Medication Sig Dispense Refill  . atorvastatin (LIPITOR) 40 MG tablet TAKE 1 TABLET BY MOUTH EVERY DAY 90 tablet 0  . clopidogrel (PLAVIX) 75 MG tablet     . lisinopril (PRINIVIL,ZESTRIL) 5 MG tablet Take 1 tablet (5 mg total) by mouth daily. 90 tablet 3  . pantoprazole (PROTONIX) 40 MG tablet TAKE 1 TABLET BY MOUTH DAILY AT 6:00 A.M. 30 tablet 10  . tamsulosin (FLOMAX) 0.4 MG CAPS capsule Take 1 capsule (0.4 mg total) by mouth daily. (Patient not taking: Reported on 06/21/2015) 30 capsule 3   No current facility-administered medications for this visit.    Review of Systems  Constitutional: Negative.  Negative for fever, chills, weight loss and malaise/fatigue.  HENT: Negative.  Negative for congestion, hearing loss, nosebleeds, sore throat and tinnitus.   Eyes: Negative.  Negative for blurred vision, double vision, pain and discharge.  Respiratory: Negative.  Negative for cough, hemoptysis, sputum production, shortness of breath and wheezing.   Cardiovascular: Negative.  Negative for chest pain, palpitations, claudication, leg swelling and PND.  Gastrointestinal: Negative.  Negative for heartburn, nausea, vomiting, abdominal pain, diarrhea, constipation, blood in stool and melena.  Genitourinary: Negative.  Negative for dysuria, urgency, frequency and hematuria.  Musculoskeletal: Negative.  Negative for myalgias, joint pain and falls.  Skin: Negative.  Negative for itching and rash.    Neurological: Negative.  Negative for dizziness, tingling, tremors, sensory change, speech change, focal weakness, seizures, loss of consciousness, weakness and headaches.  Endo/Heme/Allergies: Negative.  Does not bruise/bleed easily.  Psychiatric/Behavioral: Negative.  Negative for depression, suicidal ideas, memory loss and substance abuse. The patient is not nervous/anxious and does not have insomnia.   All other systems reviewed and are negative.  14 point ROS was done and is otherwise as detailed above or in HPI   PHYSICAL EXAMINATION: ECOG PERFORMANCE STATUS: 0 - Asymptomatic  Filed Vitals:   06/29/15 0800  BP: 143/65  Pulse: 57  Temp: 97.7 F (36.5 C)  Resp: 16   Filed Weights   06/29/15 0800  Weight: 171 lb (77.565 kg)    Physical Exam  Constitutional: He is oriented to person, place, and time and well-developed, well-nourished, and in no distress.  HENT:  Head: Normocephalic and atraumatic.  Nose: Nose normal.  Mouth/Throat: Oropharynx is clear and moist. No oropharyngeal exudate.  Eyes: Conjunctivae and EOM are normal. Pupils are equal, round, and reactive to light. Right eye exhibits no discharge. Left eye exhibits no discharge. No scleral icterus.  Neck: Normal range of motion. Neck supple. No tracheal deviation present.  No thyromegaly present.  Cardiovascular: Normal rate, regular rhythm and normal heart sounds.  Exam reveals no gallop and no friction rub.   No murmur heard. Pulmonary/Chest: Effort normal and breath sounds normal. He has no wheezes. He has no rales.  Abdominal: Soft. Bowel sounds are normal. He exhibits no distension and no mass. There is no tenderness. There is no rebound and no guarding.  Musculoskeletal: Normal range of motion. He exhibits no edema.  Lymphadenopathy:    He has no cervical adenopathy.  Neurological: He is alert and oriented to person, place, and time. He has normal reflexes. No cranial nerve deficit. Gait normal. Coordination  normal.  Skin: Skin is warm and dry. No rash noted.  Psychiatric: Mood, memory, affect and judgment normal.  Nursing note and vitals reviewed.   LABORATORY DATA:  I have reviewed the data as listed Lab Results  Component Value Date   WBC 9.1 05/20/2015   HGB 10.2* 05/20/2015   HCT 31.9* 05/20/2015   MCV 88.1 05/20/2015   PLT 232 05/20/2015   CMP     Component Value Date/Time   NA 138 04/13/2015 1252   K 4.7 04/13/2015 1252   CL 107 04/13/2015 1252   CO2 25 04/13/2015 1252   GLUCOSE 108* 04/13/2015 1252   BUN 17 04/13/2015 1252   CREATININE 1.32* 04/13/2015 1252   CREATININE 1.05 01/21/2013 0510   CALCIUM 8.6 04/13/2015 1252   PROT 6.4 04/13/2015 1252   ALBUMIN 3.7 04/13/2015 1252   AST 14 04/13/2015 1252   ALT 8* 04/13/2015 1252   ALKPHOS 107 04/13/2015 1252   BILITOT 0.3 04/13/2015 1252   GFRNONAA 70* 01/21/2013 0510   GFRAA 82* 01/21/2013 0510   Results for DIJON, MCMEANS (MRN [id number]) as of 07/10/2015 21:03  Ref. Range 06/21/2015 16:10  Iron Latest Ref Range: 45-182 ug/dL 13 (L)  UIBC Latest Units: ug/dL 331  TIBC Latest Ref Range: 250-450 ug/dL 344  Saturation Ratios Latest Ref Range: 17.9-39.5 % 4 (L)  Ferritin Latest Ref Range: 24-336 ng/mL 13 (L)  Folate Latest Ref Range: >5.9 ng/mL 15.2  CEA Latest Ref Range: 0.0-4.7 ng/mL 8.0 (H)    RADIOGRAPHIC STUDIES: I have personally reviewed the radiological images as listed and agreed with the findings in the report. No results found.     PATHOLOGY:   ASSESSMENT & PLAN:  Cecal Carcinoma Multiple polyps with high grade dysplasia/foci adenocarcinoma Gastric adenocarcinoma Anemia Iron deficiency Elevated CEA Bilateral Pulmonary nodules Tobacco Abuse  He will be referred to Dr. Barry Dienes for consultation. I suspect he may need a subtotal colectomy. He will definitely need a genetics referral and we can arrange this here at [facility] with Santiago Glad after his surgery.  We will start him on iron for his iron  deficiency anemia.  Pulmonary nodules will be followed. Anemia will be followed post-operatively for resolution. CEA is elevated and will be repeated post op as well.  Smoking cessation was discussed with the patient in detail. He is not interested in quitting.   All questions were answered. The patient knows to call the clinic with any problems, questions or concerns.  This document serves as a record of services personally performed by Ancil Linsey, MD. It was created on her behalf by Toni Amend, a trained medical scribe. The creation of this record is based on the scribe's personal observations and the provider's statements to them. This document has been checked and approved by the attending provider.  I have reviewed the above documentation for  accuracy and completeness, and I agree with the above.  This note was electronically signed.    Molli Hazard, MD  07/10/2015 9:02 PM

## 2015-06-29 NOTE — Patient Instructions (Addendum)
[place] at [facility] Discharge Instructions  RECOMMENDATIONS MADE BY THE CONSULTANT AND ANY TEST RESULTS WILL BE SENT TO YOUR REFERRING PHYSICIAN.    Exam and discussion by Dr Whitney Muse today  Referral to Dr Barry Dienes, they will call you with this appt Return after surgery, Hildred Alamin will be in touch with you to schedule this appt  Please call the clinic if you have any questions or concerns     Thank you for choosing [place] at [facility] to provide your oncology and hematology care.  To afford each patient quality time with our provider, please arrive at least 15 minutes before your scheduled appointment time.   Beginning January 23rd 2017 lab work for the [facility] will be done in the  Main lab at [facility] on 1st floor. If you have a lab appointment with the [facility] please come in thru the  Main Entrance and check in at the main information desk  You need to re-schedule your appointment should you arrive 10 or more minutes late.  We strive to give you quality time with our providers, and arriving late affects you and other patients whose appointments are after yours.  Also, if you no show three or more times for appointments you may be dismissed from the clinic at the providers discretion.     Again, thank you for choosing [facility].  Our hope is that these requests will decrease the amount of time that you wait before being seen by our physicians.       _____________________________________________________________  Should you have questions after your visit to [facility], please contact our office at [phone] between the hours of 8:30 a.m. and 4:30 p.m.  Voicemails left after 4:30 p.m. will not be returned until the following business day.  For prescription refill requests, have your pharmacy contact our office.         Resources For Cancer Patients and their Caregivers ? American Cancer  Society: Can assist with transportation, wigs, general needs, runs Look Good Feel Better.        712 688 0034 ? Cancer Care: Provides financial assistance, online support groups, medication/co-pay assistance.  1-800-813-HOPE (702)651-9913) ? [place] Assists [place] cancer patients and their families through emotional , educational and financial support.  (215) 350-1178 ? [place] DSS Where to apply for food stamps, Medicaid and utility assistance. 854-539-2802 ? RCATS: Transportation to medical appointments. 212-531-1831 ? Social Security Administration: May apply for disability if have a Stage IV cancer. 867-668-1709 269-011-2594 ? [employer], Disability and Transit Services: Assists with nutrition, care and transit needs. [phone]

## 2015-06-29 NOTE — Progress Notes (Signed)
Referral sent to [facility] Dr Barry Dienes.  Records faxed on 5/2

## 2015-06-30 ENCOUNTER — Encounter (HOSPITAL_COMMUNITY): Payer: Self-pay | Admitting: Internal Medicine

## 2015-07-01 ENCOUNTER — Encounter (HOSPITAL_BASED_OUTPATIENT_CLINIC_OR_DEPARTMENT_OTHER): Payer: Commercial Managed Care - HMO

## 2015-07-01 ENCOUNTER — Encounter (HOSPITAL_COMMUNITY): Payer: Self-pay | Admitting: Hematology & Oncology

## 2015-07-01 ENCOUNTER — Encounter (HOSPITAL_COMMUNITY): Payer: Self-pay

## 2015-07-01 VITALS — BP 95/42 | HR 57 | Temp 98.1°F | Resp 18

## 2015-07-01 DIAGNOSIS — C180 Malignant neoplasm of cecum: Secondary | ICD-10-CM

## 2015-07-01 DIAGNOSIS — C182 Malignant neoplasm of ascending colon: Secondary | ICD-10-CM

## 2015-07-01 DIAGNOSIS — D509 Iron deficiency anemia, unspecified: Secondary | ICD-10-CM

## 2015-07-01 HISTORY — DX: Malignant neoplasm of cecum: C18.0

## 2015-07-01 MED ORDER — FERRIC CARBOXYMALTOSE 750 MG/15ML IV SOLN
750.0000 mg | Freq: Once | INTRAVENOUS | Status: AC
  Administered 2015-07-01: 750 mg via INTRAVENOUS
  Filled 2015-07-01: qty 15

## 2015-07-01 MED ORDER — SODIUM CHLORIDE 0.9 % IV SOLN
INTRAVENOUS | Status: DC
Start: 2015-07-01 — End: 2015-07-01
  Administered 2015-07-01: 14:00:00 via INTRAVENOUS

## 2015-07-01 NOTE — Progress Notes (Signed)
Tolerated infusion w/o adverse reaction.  VSS.  In no distress.  Discharged in c/o spouse for transport home.

## 2015-07-01 NOTE — Patient Instructions (Signed)
[place] at [facility] Discharge Instructions  RECOMMENDATIONS MADE BY THE CONSULTANT AND ANY TEST RESULTS WILL BE SENT TO YOUR REFERRING PHYSICIAN.  Today you received IV iron (Injectafer). Return as scheduled for lab work and office visit.  Call the clinic should you have any questions or concerns.   Thank you for choosing [place] at [facility] to provide your oncology and hematology care.  To afford each patient quality time with our provider, please arrive at least 15 minutes before your scheduled appointment time.   Beginning January 23rd 2017 lab work for the [facility] will be done in the  Main lab at [facility] on 1st floor. If you have a lab appointment with the [facility] please come in thru the  Main Entrance and check in at the main information desk  You need to re-schedule your appointment should you arrive 10 or more minutes late.  We strive to give you quality time with our providers, and arriving late affects you and other patients whose appointments are after yours.  Also, if you no show three or more times for appointments you may be dismissed from the clinic at the providers discretion.     Again, thank you for choosing [facility].  Our hope is that these requests will decrease the amount of time that you wait before being seen by our physicians.       _____________________________________________________________  Should you have questions after your visit to [facility], please contact our office at [phone] between the hours of 8:30 a.m. and 4:30 p.m.  Voicemails left after 4:30 p.m. will not be returned until the following business day.  For prescription refill requests, have your pharmacy contact our office.         Resources For Cancer Patients and their Caregivers ? American Cancer Society: Can assist with transportation, wigs, general needs, runs Look Good Feel Better.         (601) 858-5726 ? Cancer Care: Provides financial assistance, online support groups, medication/co-pay assistance.  1-800-813-HOPE 548-068-2568) ? [place] Assists [place] cancer patients and their families through emotional , educational and financial support.  626-217-6864 ? [place] DSS Where to apply for food stamps, Medicaid and utility assistance. 410-841-0740 ? RCATS: Transportation to medical appointments. 4153202680 ? Social Security Administration: May apply for disability if have a Stage IV cancer. 2280220099 (928)297-6757 ? [employer], Disability and Transit Services: Assists with nutrition, care and transit needs. 8546353844

## 2015-07-09 ENCOUNTER — Other Ambulatory Visit: Payer: Self-pay | Admitting: General Surgery

## 2015-07-09 DIAGNOSIS — C163 Malignant neoplasm of pyloric antrum: Secondary | ICD-10-CM | POA: Diagnosis not present

## 2015-07-09 DIAGNOSIS — C189 Malignant neoplasm of colon, unspecified: Secondary | ICD-10-CM | POA: Diagnosis not present

## 2015-07-09 DIAGNOSIS — Z716 Tobacco abuse counseling: Secondary | ICD-10-CM | POA: Diagnosis not present

## 2015-07-09 DIAGNOSIS — Z800 Family history of malignant neoplasm of digestive organs: Secondary | ICD-10-CM | POA: Diagnosis not present

## 2015-07-10 ENCOUNTER — Encounter (HOSPITAL_COMMUNITY): Payer: Self-pay | Admitting: Hematology & Oncology

## 2015-07-12 ENCOUNTER — Encounter (HOSPITAL_COMMUNITY): Payer: Self-pay | Admitting: *Deleted

## 2015-07-16 ENCOUNTER — Encounter: Payer: Self-pay | Admitting: Internal Medicine

## 2015-07-16 NOTE — Progress Notes (Signed)
[name], male   DOB: 10/11/1945, 70 y.o.   MRN: [id number]   Patient's course reviewed. Unfortunately, he has multifocal, synchronous colon cancer (he may have a third area of invasive cancer in the sigmoid lesion not completely removed as well-at least high grade dysplasia). On top of all this,  he has any primary gastric adenocarcinoma.  I understand he is seeing Dr. Barry Dienes in the near future. As far as colon resection is concerned, he should have a subtotal colectomy (everything from cecum extended down and beyond the tattooed area just distal to the sigmoid lesion to ensure resection of the 2 tumors and the area of high-grade dysplasia in the sigmoid segment.    His rectum and a little of this distal sigmoid can likely be spared.

## 2015-07-19 ENCOUNTER — Ambulatory Visit (INDEPENDENT_AMBULATORY_CARE_PROVIDER_SITE_OTHER): Payer: Commercial Managed Care - HMO | Admitting: Family Medicine

## 2015-07-19 ENCOUNTER — Encounter: Payer: Self-pay | Admitting: Genetic Counselor

## 2015-07-19 ENCOUNTER — Encounter: Payer: Self-pay | Admitting: Family Medicine

## 2015-07-19 ENCOUNTER — Telehealth (HOSPITAL_COMMUNITY): Payer: Self-pay | Admitting: *Deleted

## 2015-07-19 VITALS — BP 140/70 | HR 86 | Temp 98.1°F | Ht 71.0 in | Wt 173.8 lb

## 2015-07-19 DIAGNOSIS — J441 Chronic obstructive pulmonary disease with (acute) exacerbation: Secondary | ICD-10-CM

## 2015-07-19 MED ORDER — DOXYCYCLINE MONOHYDRATE 100 MG PO TABS: 100.0000 mg | ORAL_TABLET | Freq: Two times a day (BID) | ORAL | Status: DC

## 2015-07-19 MED ORDER — PREDNISONE 20 MG PO TABS: ORAL_TABLET | ORAL | Status: DC

## 2015-07-19 MED ORDER — FLUTICASONE PROPIONATE 50 MCG/ACT NA SUSP: 1.0000 | Freq: Two times a day (BID) | NASAL | Status: DC | PRN

## 2015-07-19 MED ORDER — ALBUTEROL SULFATE HFA 108 (90 BASE) MCG/ACT IN AERS: 2.0000 | INHALATION_SPRAY | Freq: Four times a day (QID) | RESPIRATORY_TRACT | Status: DC | PRN

## 2015-07-19 NOTE — Telephone Encounter (Signed)
Patient saw Dr. Barry Dienes on 07/09/15 but still no surgery date.

## 2015-07-19 NOTE — Progress Notes (Signed)
BP 140/70 mmHg  Pulse 86  Temp(Src) 98.1 F (36.7 C) (Oral)  Ht 5\' 11"  (1.803 m)  Wt 173 lb 12.8 oz (78.835 kg)  BMI 24.25 kg/m2   Subjective:    [name], male    DOB: [birth year], 70 y.o.   MRN: [id number]  [name] is a 70 y.o. male presenting on 07/19/2015 for Sinusitis and Cough   HPI Cough and shortness of breath and wheezing Patient has been having a cough and the shortness of breath and wheezing and sinus congestion is been going on for the past 3 days. He denies any fevers or chills. His shortness of breath and tightness is been worse in the evening hours and when he is laying down. He is also been having some sinus congestion and postnasal drainage. He is a chronic smoker but has not been on any inhalers or anything just yet. He did use cough drops but that is it because he doesn't know what he can use because of his heart.  Relevant past medical, surgical, family and social history reviewed and updated as indicated. Interim medical history since our last visit reviewed. Allergies and medications reviewed and updated.  Review of Systems  Constitutional: Negative for fever and chills.  HENT: Positive for congestion, postnasal drip, rhinorrhea, sinus pressure, sneezing and sore throat. Negative for ear discharge, ear pain and voice change.   Eyes: Negative for pain, discharge, redness and visual disturbance.  Respiratory: Positive for cough, chest tightness, shortness of breath and wheezing.   Cardiovascular: Negative for chest pain and leg swelling.  Gastrointestinal: Negative for abdominal pain, diarrhea and constipation.  Genitourinary: Negative for difficulty urinating.  Musculoskeletal: Negative for back pain and gait problem.  Skin: Negative for rash.  Neurological: Negative for syncope, light-headedness and headaches.  All other systems reviewed and are negative.   Per HPI unless specifically indicated above     Medication List       This list is accurate as of: 07/19/15  5:50 PM.  Always use your most recent med list.               albuterol 108 (90 Base) MCG/ACT inhaler  Commonly known as:  PROVENTIL HFA;VENTOLIN HFA  Inhale 2 puffs into the lungs every 6 (six) hours as needed for wheezing or shortness of breath.     atorvastatin 40 MG tablet  Commonly known as:  LIPITOR  TAKE 1 TABLET BY MOUTH EVERY DAY     doxycycline 100 MG tablet  Commonly known as:  ADOXA  Take 1 tablet (100 mg total) by mouth 2 (two) times daily.     fluticasone 50 MCG/ACT nasal spray  Commonly known as:  FLONASE  Place 1 spray into both nostrils 2 (two) times daily as needed for allergies or rhinitis.     lisinopril 5 MG tablet  Commonly known as:  PRINIVIL,ZESTRIL  Take 1 tablet (5 mg total) by mouth daily.     pantoprazole 40 MG tablet  Commonly known as:  PROTONIX  TAKE 1 TABLET BY MOUTH DAILY AT 6:00 A.M.     predniSONE 20 MG tablet  Commonly known as:  DELTASONE  2 po at same time daily for 5 days           Objective:    BP 140/70 mmHg  Pulse 86  Temp(Src) 98.1 F (36.7 C) (Oral)  Ht 5\' 11"  (1.803 m)  Wt 173 lb 12.8 oz (78.835 kg)  BMI 24.25 kg/m2  Wt Readings from Last 3 Encounters:  07/19/15 173 lb 12.8 oz (78.835 kg)  06/29/15 171 lb (77.565 kg)  06/21/15 172 lb 12.8 oz (78.382 kg)    Physical Exam  Constitutional: He is oriented to person, place, and time. He appears well-developed and well-nourished. No distress.  HENT:  Right Ear: Tympanic membrane, external ear and ear canal normal.  Left Ear: Tympanic membrane, external ear and ear canal normal.  Nose: Mucosal edema and rhinorrhea present. No sinus tenderness. No epistaxis. Right sinus exhibits maxillary sinus tenderness. Right sinus exhibits no frontal sinus tenderness. Left sinus exhibits maxillary sinus tenderness. Left sinus exhibits no frontal sinus tenderness.  Mouth/Throat: Uvula is midline and mucous membranes are normal. Posterior  oropharyngeal edema and posterior oropharyngeal erythema present. No oropharyngeal exudate or tonsillar abscesses.  Eyes: Conjunctivae and EOM are normal. Pupils are equal, round, and reactive to light. Right eye exhibits no discharge. No scleral icterus.  Neck: Neck supple. No thyromegaly present.  Cardiovascular: Normal rate, regular rhythm, normal heart sounds and intact distal pulses.   No murmur heard. Pulmonary/Chest: Effort normal. No respiratory distress. He has wheezes. He has no rales. He exhibits no tenderness.  Musculoskeletal: Normal range of motion. He exhibits no edema.  Lymphadenopathy:    He has no cervical adenopathy.  Neurological: He is alert and oriented to person, place, and time. Coordination normal.  Skin: Skin is warm and dry. No rash noted. He is not diaphoretic.  Psychiatric: He has a normal mood and affect. His behavior is normal.  Nursing note and vitals reviewed.     Assessment & Plan:   Problem List Items Addressed This Visit    None    Visit Diagnoses    COPD exacerbation ([place])    -  Primary    Relevant Medications    predniSONE (DELTASONE) 20 MG tablet    albuterol (PROVENTIL HFA;VENTOLIN HFA) 108 (90 Base) MCG/ACT inhaler    fluticasone (FLONASE) 50 MCG/ACT nasal spray    doxycycline (ADOXA) 100 MG tablet        Follow up plan: Return if symptoms worsen or fail to improve.  Counseling provided for all of the vaccine components No orders of the defined types were placed in this encounter.    Caryl Pina, MD [facility] 07/19/2015, 5:50 PM

## 2015-07-21 ENCOUNTER — Ambulatory Visit (INDEPENDENT_AMBULATORY_CARE_PROVIDER_SITE_OTHER): Payer: Commercial Managed Care - HMO | Admitting: Nurse Practitioner

## 2015-07-21 ENCOUNTER — Encounter: Payer: Self-pay | Admitting: Nurse Practitioner

## 2015-07-21 VITALS — BP 137/68 | HR 50 | Temp 97.2°F | Ht 69.0 in | Wt 173.2 lb

## 2015-07-21 DIAGNOSIS — C182 Malignant neoplasm of ascending colon: Secondary | ICD-10-CM

## 2015-07-21 DIAGNOSIS — D649 Anemia, unspecified: Secondary | ICD-10-CM

## 2015-07-21 DIAGNOSIS — C169 Malignant neoplasm of stomach, unspecified: Secondary | ICD-10-CM | POA: Diagnosis not present

## 2015-07-21 DIAGNOSIS — R195 Other fecal abnormalities: Secondary | ICD-10-CM

## 2015-07-21 NOTE — Patient Instructions (Signed)
1. Have your labs drawn when you're able to. 2. Return for follow-up in 6 months.

## 2015-07-21 NOTE — Assessment & Plan Note (Signed)
Patient with a cecal mass, large polyp of the hepatic flexure, large polyp in the mid sigmoid:. Appropriate tattooing performed. Surgical pathology found cecal mass to be adenocarcinoma, hepatic flexure colon polyp found to be foci of adenocarcinoma in the background of tubulovillous adenoma, descending colon polyp tubular adenoma, sigmoid colon polyp tubulovillous adenoma with at least high-grade dysplasia. Has been probably referred to oncology with a PET scan completed showing very hypermetabolic area in the cecum. Referral to surgery with imminent surgical resection of the colon and possibly stomach. Further recommendations to be based on postsurgical findings.  Of note the patient and family are concerned about the date of the procedure. Initially there were told approximately 3 weeks. However, call a couple days ago but still procedure out to the end of June which would be nearly 2 months after the initial findings on colonoscopy. Will present these concerns to Dr. Gala Romney.

## 2015-07-21 NOTE — Progress Notes (Signed)
Referring Provider: Wardell Honour, MD Primary Care Physician:  Fransisca Kaufmann Dettinger, MD Primary GI:  Dr. Gala Romney  Chief Complaint  Patient presents with  . heme + stool    HPI:   Steven Drake is a 70 y.o. male who presents for postprocedure follow-up. He was last seen in our office 05/20/2015 for heme positive stool and anemia. No overt bleeding had been noted at that time and previous colonoscopy was over 10 years ago. His stool was heme positive on fecal occult blood test. He was arranged for a colonoscopy and follow-up in 2 months.   Colonoscopy completed on 06/10/2015 which found 3.5 x 3 cm semi-pedunculated polypoid lesion mid sigmoid, 5 x 2 cm carpet polyp hepatic flexure, exophytic and fungating appearing neoplastic process at least 5-6 cm arising out of the base of the cecum. Multiple biopsies were taken and epinephrine placement and APC ablation completed to attain hemostasis. Cecal neoplasm was tattooed, large polyp at hepatic flexure removed with piecemeal polypectomy, large polyp in the mid sigmoid section removed via piecemeal and hot snare debulking with tattooing and biopsies were sent to pathology. Pathology found cecal lesion to be adenocarcinoma, hepatic lesion focal adenocarcinoma, sigmoid lesion high-grade dysplasia. Additionally, pyloric lesion is suspicious. Patient will need subtotal colectomy and recommended repeat EGD with additional biopsies of the abnormal pyloric channel area.   Repeat EGD completed 06/17/2015 which found 2 centimeter nodular abnormality of the antrum/pyloric channel with biopsy taken, normal esophagus. 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  The patient was referred to oncology who saw him for the first time for 06/21/2015 for colon cancer, gastric adenocarcinoma, pulmonary nodules.  Recommendations include PET/CT to rule out metastatic disease, surgical referral to surgical oncologist, referral to genetics eventually. Follow-up with oncology 06/29/2015 which notes referral to Dr. Cher Nakai for consultation, referral to genetics, iron for iron deficiency anemia, follow pulmonary nodules. Patient saw Dr. Cher Nakai on 07/09/2015 but does not have a procedure date scheduled currently.   PET scan completed on 06/28/2015 which shows intensely hypermetabolic cecal mass, no definitive evidence of metastatic disease, scattered upper lobe predominant pulmonary nodules too small for PET resolution.  Today he states he's doing pretty well, feels good. Is anxious about his diagnosis and is wanting to get the surgery over with so he can move on with life. He and his family express concerns about the procedure date stating "they initially told us about 3 weeks, which would be next week. But we just got a call a couple days ago saying June 24. We're worried about waiting that long, Dr. Whitney Muse said surgery should happen ASAP." Denies abdominal pain, N/V, obvious rectal bleeding, fever, chills, unintentional weight loss. Energy level is good, especially after receiving iron with hematology/oncology stating "I just mowed a hundred acres, I'm doing the things I need to be doing." He is concerned about how long he'll be out due to surgery, but he was reminded his health is a priority. Discussed need for surveillance colonoscopy after his surgery and need for more frequent surveillance. Also recommended he inform primary relatives of his diagnosis so appropriate screenings can be done for them as well by their healthcare providers.   Denies chest pain, dyspnea, dizziness, lightheadedness, syncope, near syncope. Denies any other upper or lower GI symptoms.   Past Medical History  Diagnosis Date  . GERD (gastroesophageal reflux disease)   .  STEMI (ST elevation myocardial infarction), 01/19/13 01/19/2013  .  CAD (coronary artery disease) RCA non dominant vessel, LAD 40%, 80-90% 2nd diag EF 55%  01/19/2013  . Hyperlipidemia LDL goal < 70 01/19/2013  . Pulmonary nodules 01/19/2013  . Hypokalemia 01/20/2013  . Tobacco abuse 01/20/2013  . Metabolic syndrome, with mildly elevated HgBA1C 01/20/2013  . Gastric adenocarcinoma ([place]) 06/29/2015    Past Surgical History  Procedure Laterality Date  . Appendectomy    . Left heart cath N/A 01/19/2013    Procedure: LEFT HEART CATH;  Surgeon: Troy Sine, MD;  Location: [facility] CATH LAB;  Service: Cardiovascular;  Laterality: N/A;  . Hernia repair    . Colonoscopy N/A 06/10/2015    RMR: cecal neoplasm  ? biopsied. large polyp at the hepatic flexure removed with piecmeal polypectomy and APC ablation. lesion tattooed. Large polyp in the mid sigmoid status post piecemeal hot snare debulking and tattooing. this lesion not completely removed. scatterd pancolonic diverticulsosis. no speciments collected.   . Esophagogastroduodenoscopy N/A 06/10/2015    Procedure: ESOPHAGOGASTRODUODENOSCOPY (EGD);  Surgeon: Daneil Dolin, MD;  Location: [facility] END[facility];  Service: Endoscopy;  Laterality: N/A;  . Esophagogastroduodenoscopy N/A 06/17/2015    RMR: normal esophagus small hiatal hernia. Abnormal nodular antrum .pyloric channel status post biopsy.     Current Outpatient Prescriptions  Medication Sig Dispense Refill  . albuterol (PROVENTIL HFA;VENTOLIN HFA) 108 (90 Base) MCG/ACT inhaler Inhale 2 puffs into the lungs every 6 (six) hours as needed for wheezing or shortness of breath. 1 Inhaler 0  . atorvastatin (LIPITOR) 40 MG tablet TAKE 1 TABLET BY MOUTH EVERY DAY 90 tablet 0  . doxycycline (ADOXA) 100 MG tablet Take 1 tablet (100 mg total) by mouth 2 (two) times daily. 14 tablet 0  . fluticasone (FLONASE) 50 MCG/ACT nasal spray Place 1 spray into both nostrils 2 (two) times daily as needed for allergies or rhinitis. 16 g 6  . lisinopril (PRINIVIL,ZESTRIL) 5 MG tablet Take 1  tablet (5 mg total) by mouth daily. 90 tablet 3  . pantoprazole (PROTONIX) 40 MG tablet TAKE 1 TABLET BY MOUTH DAILY AT 6:00 A.M. 30 tablet 10  . predniSONE (DELTASONE) 20 MG tablet 2 po at same time daily for 5 days 10 tablet 0  . aspirin 81 MG tablet Take 81 mg by mouth daily. Reported on 07/21/2015    . tamsulosin (FLOMAX) 0.4 MG CAPS capsule Reported on 07/21/2015     No current facility-administered medications for this visit.    Allergies as of 07/21/2015 - Review Complete 07/21/2015  Allergen Reaction Noted  . Contrast media [iodinated diagnostic agents] Rash 05/20/2015    Family History  Problem Relation Age of Onset  . Cancer Father     colon  . Cancer Sister     colon    Social History   Social History  . Marital Status: Widowed    Spouse Name: N/A  . Number of Children: N/A  . Years of Education: N/A   Occupational History  . farmer    Social History Main Topics  . Smoking status: Current Every Day Smoker -- 1.00 packs/day for 58 years    Types: Cigarettes    Start date: 01/21/1963  . Smokeless tobacco: Never Used     Comment: one pack daily  . Alcohol Use: No  . Drug Use: No  . Sexual Activity: Not Asked   Other Topics Concern  . None   Social History Narrative    Review of  Systems: General: Negative for anorexia, weight loss, fever, chills, fatigue, weakness. ENT: Negative for hoarseness, difficulty swallowing. CV: Negative for chest pain, angina, palpitations, peripheral edema.  Respiratory: Negative for dyspnea at rest, cough, sputum, wheezing.  GI: See history of present illness. Endo: Negative for unusual weight change.  Heme: Negative for bruising or bleeding.   Physical Exam: BP 137/68 mmHg  Pulse 50  Temp(Src) 97.2 F (36.2 C)  Ht 5\' 9"  (1.753 m)  Wt 173 lb 3.2 oz (78.563 kg)  BMI 25.57 kg/m2 General:   Alert and oriented. Pleasant and cooperative. Well-nourished and well-developed.  Head:  Normocephalic and atraumatic. Eyes:   Without icterus, sclera clear and conjunctiva pink.  Ears:  Normal auditory acuity. Cardiovascular:  S1, S2 present without murmurs appreciated. Extremities without clubbing or edema. Respiratory:  Clear to auscultation bilaterally. No wheezes, rales, or rhonchi. No distress.  Gastrointestinal:  +BS, soft, non-tender and non-distended. No HSM noted. No guarding or rebound. No masses appreciated.  Rectal:  Deferred  Musculoskalatal:  Symmetrical without gross deformities. Neurologic:  Alert and oriented x4;  grossly normal neurologically. Psych:  Alert and cooperative. Normal mood and affect. Heme/Lymph/Immune: No excessive bruising noted.    07/21/2015 2:35 PM   Disclaimer: This note was dictated with voice recognition software. Similar sounding words can inadvertently be transcribed and may not be corrected upon review.

## 2015-07-21 NOTE — Assessment & Plan Note (Signed)
Patient with anemia, received iron by oncology. Last labs couple months ago. At this point we'll check CBC and ferritin to follow-up. Return for follow-up in 6 months.

## 2015-07-21 NOTE — Assessment & Plan Note (Signed)
Gastric nodule of the antrum/pyloric channel status post biopsy found to be invasive poorly differentiated adenocarcinoma with signet ring cell features arising in the background of atrophic gastritis with intestinal metaplasia. His currently seeing oncology, plan for surgical resection immanently of the colon and likely stomach as well. Return for follow-up in 6 months.

## 2015-07-21 NOTE — Assessment & Plan Note (Signed)
Heme positive stool on fecal occult blood test, continues with no obvious GI bleeding. Continue to monitor, follow-up in 6 months after surgical procedures and oncology recommendations.

## 2015-07-22 NOTE — Progress Notes (Signed)
cc'ed to pcp °

## 2015-07-27 ENCOUNTER — Other Ambulatory Visit: Payer: Self-pay | Admitting: Family Medicine

## 2015-07-27 NOTE — Patient Instructions (Addendum)
Steven Drake  07/27/2015   Your procedure is scheduled on: 08/02/2015    Report to [facility] Main  Entrance take Steven Drake  elevators to 3rd floor to  Steven Drake at     0800 AM.  Call this number if you have problems the morning of surgery [phone]   Remember: ONLY 1 PERSON MAY GO WITH YOU TO SHORT STAY TO GET  READY MORNING OF Steven Drake.  Do not eat food or drink liquids :After Midnight.             FOLLOW BOWEL PREP INSTRUCTIONS PER MD              DRINK PLENTY OF CLEAR LIQUIDS DAY OF BOWEL PREP PER MD      Take these medicines the morning of surgery with A SIP OF WATER:  Pantoprazole. Use/bring Flonase nasal spray. DO NOT TAKE ANY DIABETIC MEDICATIONS DAY OF YOUR SURGERY                               You may not have any metal on your body including hair pins and              piercings  Do not wear jewelry, , lotions, powders or perfumes, deodorant                         Men may shave face and neck.   Do not bring valuables to the hospital. Steven Drake.  Contacts, dentures or bridgework may not be worn into surgery.  Leave suitcase in the car. After surgery it may be brought to your room.        Special Instructions: coughing and deep breathing exercises, leg exercises               Please read over the following fact sheets you were given: _____________________________________________________________________             [facility] - Preparing for Surgery Before surgery, you can play an important role.  Because skin is not sterile, your skin needs to be as free of germs as possible.  You can reduce the number of germs on your skin by washing with CHG (chlorahexidine gluconate) soap before surgery.  CHG is an antiseptic cleaner which kills germs and bonds with the skin to continue killing germs even after washing. Please DO NOT use if you have an allergy to CHG or antibacterial soaps.  If  your skin becomes reddened/irritated stop using the CHG and inform your nurse when you arrive at Short Stay. Do not shave (including legs and underarms) for at least 48 hours prior to the first CHG shower.  You may shave your face/neck. Please follow these instructions carefully:  1.  Shower with CHG Soap the night before surgery and the  morning of Surgery.  2.  If you choose to wash your hair, wash your hair first as usual with your  normal  shampoo.  3.  After you shampoo, rinse your hair and body thoroughly to remove the  shampoo.  4.  Use CHG as you would any other liquid soap.  You can apply chg directly  to the skin and wash                       Gently with a scrungie or clean washcloth.  5.  Apply the CHG Soap to your body ONLY FROM THE NECK DOWN.   Do not use on face/ open                           Wound or open sores. Avoid contact with eyes, ears mouth and genitals (private parts).                       Wash face,  Genitals (private parts) with your normal soap.             6.  Wash thoroughly, paying special attention to the area where your surgery  will be performed.  7.  Thoroughly rinse your body with warm water from the neck down.  8.  DO NOT shower/wash with your normal soap after using and rinsing off  the CHG Soap.                9.  Pat yourself dry with a clean towel.            10.  Wear clean pajamas.            11.  Place clean sheets on your bed the night of your first shower and do not  sleep with pets. Day of Surgery : Do not apply any lotions/deodorants the morning of surgery.  Please wear clean clothes to the hospital/surgery center.  FAILURE TO FOLLOW THESE INSTRUCTIONS MAY RESULT IN THE CANCELLATION OF YOUR SURGERY PATIENT SIGNATURE_________________________________  NURSE SIGNATURE__________________________________  ________________________________________________________________________    CLEAR LIQUID DIET   Foods Allowed                                                                      Foods Excluded  Coffee and tea, regular and decaf                             liquids that you cannot  Plain Jell-O in any flavor                                             see through such as: Fruit ices (not with fruit pulp)                                     milk, soups, orange juice  Iced Popsicles                                    All solid food Carbonated beverages, regular and diet  Cranberry, grape and apple juices Sports drinks like Gatorade Lightly seasoned clear broth or consume(fat free) Sugar, honey syrup  Sample Menu Breakfast                                Lunch                                     Supper Cranberry juice                    Beef broth                            Chicken broth Jell-O                                     Grape juice                           Apple juice Coffee or tea                        Jell-O                                      Popsicle                                                Coffee or tea                        Coffee or tea  _____________________________________________________________________    _______________________________________________________________________

## 2015-07-29 ENCOUNTER — Encounter (HOSPITAL_COMMUNITY)
Admission: RE | Admit: 2015-07-29 | Discharge: 2015-07-29 | Disposition: A | Discharge status: Home / Self Care | Payer: Commercial Managed Care - HMO | Source: Ambulatory Visit | Attending: General Surgery | Admitting: General Surgery

## 2015-07-29 ENCOUNTER — Encounter (HOSPITAL_COMMUNITY): Payer: Self-pay

## 2015-07-29 DIAGNOSIS — Z01812 Encounter for preprocedural laboratory examination: Secondary | ICD-10-CM | POA: Diagnosis not present

## 2015-07-29 DIAGNOSIS — Z0183 Encounter for blood typing: Secondary | ICD-10-CM | POA: Insufficient documentation

## 2015-07-29 DIAGNOSIS — C189 Malignant neoplasm of colon, unspecified: Secondary | ICD-10-CM | POA: Diagnosis not present

## 2015-07-29 DIAGNOSIS — C169 Malignant neoplasm of stomach, unspecified: Secondary | ICD-10-CM | POA: Diagnosis not present

## 2015-07-29 HISTORY — DX: Essential (primary) hypertension: I10

## 2015-07-29 HISTORY — DX: Personal history of other medical treatment: Z92.89

## 2015-07-29 HISTORY — DX: Gastric ulcer, unspecified as acute or chronic, without hemorrhage or perforation: K25.9

## 2015-07-29 LAB — CBC
HEMATOCRIT: 34 % — AB (ref 39.0–52.0)
Hemoglobin: 10.7 g/dL — ABNORMAL LOW (ref 13.0–17.0)
MCH: 27.8 pg (ref 26.0–34.0)
MCHC: 31.5 g/dL (ref 30.0–36.0)
MCV: 88.3 fL (ref 78.0–100.0)
PLATELETS: 211 10*3/uL (ref 150–400)
RBC: 3.85 MIL/uL — AB (ref 4.22–5.81)
RDW: 18 % — ABNORMAL HIGH (ref 11.5–15.5)
WBC: 10.1 10*3/uL (ref 4.0–10.5)

## 2015-07-29 LAB — BASIC METABOLIC PANEL
ANION GAP: 4 — AB (ref 5–15)
BUN: 16 mg/dL (ref 6–20)
CALCIUM: 8.5 mg/dL — AB (ref 8.9–10.3)
CO2: 24 mmol/L (ref 22–32)
Chloride: 110 mmol/L (ref 101–111)
Creatinine, Ser: 1.22 mg/dL (ref 0.61–1.24)
GFR, EST NON AFRICAN AMERICAN: 57 mL/min — AB (ref >60)
GLUCOSE: 116 mg/dL — AB (ref 65–99)
POTASSIUM: 3.9 mmol/L (ref 3.5–5.1)
Sodium: 138 mmol/L (ref 135–145)

## 2015-07-29 NOTE — Pre-Procedure Instructions (Signed)
EKG 03-15-15 , Cardiology note visit 03-05-15. CT Chest 3'17, Cardiac cath 12'14 Epic.

## 2015-07-30 LAB — ABO/RH: ABO/RH(D): O NEG

## 2015-07-30 LAB — HEMOGLOBIN A1C
HEMOGLOBIN A1C: 6.2 % — AB (ref 4.8–5.6)
Mean Plasma Glucose: 131 mg/dL

## 2015-08-02 ENCOUNTER — Encounter (HOSPITAL_COMMUNITY)
Admission: RE | Disposition: A | Discharge status: Home / Self Care | Payer: Self-pay | Source: Ambulatory Visit | Attending: General Surgery

## 2015-08-02 ENCOUNTER — Inpatient Hospital Stay (HOSPITAL_COMMUNITY)
Admission: RE | Admit: 2015-08-02 | Discharge: 2015-08-09 | DRG: 327 | Disposition: A | Discharge status: Home / Self Care | Payer: Commercial Managed Care - HMO | Source: Ambulatory Visit | Attending: General Surgery | Admitting: General Surgery

## 2015-08-02 ENCOUNTER — Encounter (HOSPITAL_COMMUNITY): Payer: Self-pay

## 2015-08-02 ENCOUNTER — Inpatient Hospital Stay (HOSPITAL_COMMUNITY): Payer: Commercial Managed Care - HMO | Admitting: Anesthesiology

## 2015-08-02 DIAGNOSIS — Z888 Allergy status to other drugs, medicaments and biological substances status: Secondary | ICD-10-CM | POA: Diagnosis not present

## 2015-08-02 DIAGNOSIS — C184 Malignant neoplasm of transverse colon: Secondary | ICD-10-CM | POA: Diagnosis not present

## 2015-08-02 DIAGNOSIS — E7800 Pure hypercholesterolemia, unspecified: Secondary | ICD-10-CM | POA: Diagnosis present

## 2015-08-02 DIAGNOSIS — C189 Malignant neoplasm of colon, unspecified: Secondary | ICD-10-CM | POA: Diagnosis not present

## 2015-08-02 DIAGNOSIS — R05 Cough: Secondary | ICD-10-CM | POA: Diagnosis present

## 2015-08-02 DIAGNOSIS — I10 Essential (primary) hypertension: Secondary | ICD-10-CM | POA: Diagnosis not present

## 2015-08-02 DIAGNOSIS — K219 Gastro-esophageal reflux disease without esophagitis: Secondary | ICD-10-CM | POA: Diagnosis not present

## 2015-08-02 DIAGNOSIS — F17200 Nicotine dependence, unspecified, uncomplicated: Secondary | ICD-10-CM | POA: Diagnosis present

## 2015-08-02 DIAGNOSIS — Z800 Family history of malignant neoplasm of digestive organs: Secondary | ICD-10-CM

## 2015-08-02 DIAGNOSIS — I2510 Atherosclerotic heart disease of native coronary artery without angina pectoris: Secondary | ICD-10-CM | POA: Diagnosis present

## 2015-08-02 DIAGNOSIS — D649 Anemia, unspecified: Secondary | ICD-10-CM | POA: Diagnosis not present

## 2015-08-02 DIAGNOSIS — Z8249 Family history of ischemic heart disease and other diseases of the circulatory system: Secondary | ICD-10-CM | POA: Diagnosis not present

## 2015-08-02 DIAGNOSIS — C169 Malignant neoplasm of stomach, unspecified: Secondary | ICD-10-CM | POA: Diagnosis not present

## 2015-08-02 DIAGNOSIS — I252 Old myocardial infarction: Secondary | ICD-10-CM

## 2015-08-02 DIAGNOSIS — C19 Malignant neoplasm of rectosigmoid junction: Secondary | ICD-10-CM

## 2015-08-02 DIAGNOSIS — R918 Other nonspecific abnormal finding of lung field: Secondary | ICD-10-CM | POA: Diagnosis present

## 2015-08-02 DIAGNOSIS — Z79899 Other long term (current) drug therapy: Secondary | ICD-10-CM | POA: Diagnosis not present

## 2015-08-02 DIAGNOSIS — K5730 Diverticulosis of large intestine without perforation or abscess without bleeding: Secondary | ICD-10-CM | POA: Diagnosis present

## 2015-08-02 DIAGNOSIS — C188 Malignant neoplasm of overlapping sites of colon: Secondary | ICD-10-CM | POA: Diagnosis not present

## 2015-08-02 DIAGNOSIS — R066 Hiccough: Secondary | ICD-10-CM | POA: Diagnosis not present

## 2015-08-02 DIAGNOSIS — D126 Benign neoplasm of colon, unspecified: Secondary | ICD-10-CM | POA: Diagnosis present

## 2015-08-02 DIAGNOSIS — K2940 Chronic atrophic gastritis without bleeding: Secondary | ICD-10-CM | POA: Diagnosis present

## 2015-08-02 DIAGNOSIS — C180 Malignant neoplasm of cecum: Secondary | ICD-10-CM | POA: Diagnosis not present

## 2015-08-02 DIAGNOSIS — C163 Malignant neoplasm of pyloric antrum: Secondary | ICD-10-CM | POA: Diagnosis not present

## 2015-08-02 DIAGNOSIS — C187 Malignant neoplasm of sigmoid colon: Secondary | ICD-10-CM | POA: Diagnosis not present

## 2015-08-02 DIAGNOSIS — C772 Secondary and unspecified malignant neoplasm of intra-abdominal lymph nodes: Secondary | ICD-10-CM | POA: Diagnosis not present

## 2015-08-02 HISTORY — DX: Malignant neoplasm of rectosigmoid junction: C19

## 2015-08-02 HISTORY — PX: LAPAROSCOPIC SUBTOTAL COLECTOMY: SHX5930

## 2015-08-02 SURGERY — LAPAROSCOPIC SUBTOTAL COLECTOMY
Anesthesia: General

## 2015-08-02 MED ORDER — DEXAMETHASONE SODIUM PHOSPHATE 10 MG/ML IJ SOLN
INTRAMUSCULAR | Status: DC | PRN
  Administered 2015-08-02: 10 mg via INTRAVENOUS

## 2015-08-02 MED ORDER — ACETAMINOPHEN 650 MG RE SUPP: 650.0000 mg | Freq: Four times a day (QID) | RECTAL | Status: DC | PRN

## 2015-08-02 MED ORDER — ROCURONIUM BROMIDE 100 MG/10ML IV SOLN
INTRAVENOUS | Status: AC
  Filled 2015-08-02: qty 1

## 2015-08-02 MED ORDER — DEXTROSE 5 % IV SOLN
2.0000 g | Freq: Two times a day (BID) | INTRAVENOUS | Status: AC
  Administered 2015-08-02: 2 g via INTRAVENOUS
  Filled 2015-08-02: qty 2

## 2015-08-02 MED ORDER — SUGAMMADEX SODIUM 200 MG/2ML IV SOLN
INTRAVENOUS | Status: DC | PRN
  Administered 2015-08-02: 200 mg via INTRAVENOUS

## 2015-08-02 MED ORDER — ONDANSETRON HCL 4 MG/2ML IJ SOLN
INTRAMUSCULAR | Status: DC | PRN
  Administered 2015-08-02: 4 mg via INTRAVENOUS

## 2015-08-02 MED ORDER — BUPIVACAINE-EPINEPHRINE 0.25% -1:200000 IJ SOLN
INTRAMUSCULAR | Status: DC | PRN
  Administered 2015-08-02: 7.5 mL

## 2015-08-02 MED ORDER — ALBUTEROL SULFATE (2.5 MG/3ML) 0.083% IN NEBU
2.5000 mg | INHALATION_SOLUTION | Freq: Four times a day (QID) | RESPIRATORY_TRACT | Status: DC | PRN
  Administered 2015-08-04 – 2015-08-06 (×4): 2.5 mg via RESPIRATORY_TRACT
  Filled 2015-08-02 (×4): qty 3

## 2015-08-02 MED ORDER — FLUTICASONE PROPIONATE 50 MCG/ACT NA SUSP: 1.0000 | Freq: Two times a day (BID) | NASAL | Status: DC | PRN

## 2015-08-02 MED ORDER — ALBUTEROL SULFATE (2.5 MG/3ML) 0.083% IN NEBU
2.5000 mg | INHALATION_SOLUTION | Freq: Once | RESPIRATORY_TRACT | Status: AC
  Administered 2015-08-02: 2.5 mg via RESPIRATORY_TRACT

## 2015-08-02 MED ORDER — HYDROMORPHONE 1 MG/ML IV SOLN
INTRAVENOUS | Status: AC
  Filled 2015-08-02: qty 25

## 2015-08-02 MED ORDER — EPHEDRINE SULFATE 50 MG/ML IJ SOLN
INTRAMUSCULAR | Status: AC
  Filled 2015-08-02: qty 1

## 2015-08-02 MED ORDER — PHENYLEPHRINE HCL 10 MG/ML IJ SOLN
INTRAMUSCULAR | Status: DC | PRN
  Administered 2015-08-02 (×4): 40 ug via INTRAVENOUS

## 2015-08-02 MED ORDER — PHENYLEPHRINE 40 MCG/ML (10ML) SYRINGE FOR IV PUSH (FOR BLOOD PRESSURE SUPPORT)
PREFILLED_SYRINGE | INTRAVENOUS | Status: AC
  Filled 2015-08-02: qty 10

## 2015-08-02 MED ORDER — LABETALOL HCL 5 MG/ML IV SOLN
INTRAVENOUS | Status: AC
  Filled 2015-08-02: qty 4

## 2015-08-02 MED ORDER — 0.9 % SODIUM CHLORIDE (POUR BTL) OPTIME
TOPICAL | Status: DC | PRN
  Administered 2015-08-02: 3000 mL

## 2015-08-02 MED ORDER — HYDROMORPHONE HCL 1 MG/ML IJ SOLN
0.2500 mg | INTRAMUSCULAR | Status: DC | PRN
  Administered 2015-08-02 (×2): 0.5 mg via INTRAVENOUS

## 2015-08-02 MED ORDER — PHENYLEPHRINE HCL 10 MG/ML IJ SOLN
INTRAMUSCULAR | Status: AC
  Filled 2015-08-02: qty 1

## 2015-08-02 MED ORDER — ALVIMOPAN 12 MG PO CAPS
12.0000 mg | ORAL_CAPSULE | Freq: Once | ORAL | Status: AC
  Administered 2015-08-02: 12 mg via ORAL
  Filled 2015-08-02: qty 1

## 2015-08-02 MED ORDER — LIDOCAINE HCL (CARDIAC) 20 MG/ML IV SOLN
INTRAVENOUS | Status: AC
  Filled 2015-08-02: qty 5

## 2015-08-02 MED ORDER — DIPHENHYDRAMINE HCL 12.5 MG/5ML PO ELIX
12.5000 mg | ORAL_SOLUTION | Freq: Four times a day (QID) | ORAL | Status: DC | PRN
  Filled 2015-08-02: qty 5

## 2015-08-02 MED ORDER — ONDANSETRON 4 MG PO TBDP
4.0000 mg | ORAL_TABLET | Freq: Four times a day (QID) | ORAL | Status: DC | PRN
Start: 2015-08-02 — End: 2015-08-09
  Administered 2015-08-07 – 2015-08-08 (×2): 4 mg via ORAL
  Filled 2015-08-02 (×2): qty 1

## 2015-08-02 MED ORDER — NALOXONE HCL 0.4 MG/ML IJ SOLN: 0.4000 mg | INTRAMUSCULAR | Status: DC | PRN

## 2015-08-02 MED ORDER — HYDROMORPHONE HCL 1 MG/ML IJ SOLN
1.0000 mg | INTRAMUSCULAR | Status: DC | PRN
Start: 2015-08-02 — End: 2015-08-09

## 2015-08-02 MED ORDER — FENTANYL CITRATE (PF) 250 MCG/5ML IJ SOLN
INTRAMUSCULAR | Status: DC | PRN
  Administered 2015-08-02 (×7): 50 ug via INTRAVENOUS

## 2015-08-02 MED ORDER — FENTANYL CITRATE (PF) 100 MCG/2ML IJ SOLN
INTRAMUSCULAR | Status: AC
  Filled 2015-08-02: qty 2

## 2015-08-02 MED ORDER — SODIUM CHLORIDE 0.9 % IJ SOLN
INTRAMUSCULAR | Status: AC
  Filled 2015-08-02: qty 10

## 2015-08-02 MED ORDER — CEFOTETAN DISODIUM-DEXTROSE 2-2.08 GM-% IV SOLR
2.0000 g | INTRAVENOUS | Status: AC
  Administered 2015-08-02: 2 g via INTRAVENOUS

## 2015-08-02 MED ORDER — BUPIVACAINE 0.25 % ON-Q PUMP DUAL CATH 300 ML
300.0000 mL | INJECTION | Status: DC
Start: 2015-08-02 — End: 2015-08-06
  Filled 2015-08-02: qty 300

## 2015-08-02 MED ORDER — GLYCOPYRROLATE 0.2 MG/ML IJ SOLN
INTRAMUSCULAR | Status: DC | PRN
  Administered 2015-08-02: 0.2 mg via INTRAVENOUS

## 2015-08-02 MED ORDER — POLYETHYLENE GLYCOL 3350 17 GM/SCOOP PO POWD: 1.0000 | Freq: Once | ORAL | Status: DC

## 2015-08-02 MED ORDER — ROCURONIUM BROMIDE 100 MG/10ML IV SOLN
INTRAVENOUS | Status: DC | PRN
  Administered 2015-08-02 (×2): 10 mg via INTRAVENOUS
  Administered 2015-08-02: 30 mg via INTRAVENOUS
  Administered 2015-08-02 (×6): 10 mg via INTRAVENOUS

## 2015-08-02 MED ORDER — DEXAMETHASONE SODIUM PHOSPHATE 10 MG/ML IJ SOLN
INTRAMUSCULAR | Status: AC
  Filled 2015-08-02: qty 1

## 2015-08-02 MED ORDER — HYDRALAZINE HCL 20 MG/ML IJ SOLN: 10.0000 mg | INTRAMUSCULAR | Status: DC | PRN

## 2015-08-02 MED ORDER — LABETALOL HCL 5 MG/ML IV SOLN
INTRAVENOUS | Status: DC | PRN
  Administered 2015-08-02: 5 mg via INTRAVENOUS

## 2015-08-02 MED ORDER — NEOMYCIN SULFATE 500 MG PO TABS: 1000.0000 mg | ORAL_TABLET | ORAL | Status: DC

## 2015-08-02 MED ORDER — METRONIDAZOLE 500 MG PO TABS: 1000.0000 mg | ORAL_TABLET | ORAL | Status: DC

## 2015-08-02 MED ORDER — PROMETHAZINE HCL 25 MG/ML IJ SOLN: 6.2500 mg | INTRAMUSCULAR | Status: DC | PRN

## 2015-08-02 MED ORDER — ONDANSETRON HCL 4 MG/2ML IJ SOLN
4.0000 mg | Freq: Four times a day (QID) | INTRAMUSCULAR | Status: DC | PRN
  Administered 2015-08-03: 4 mg via INTRAVENOUS

## 2015-08-02 MED ORDER — KCL IN DEXTROSE-NACL 20-5-0.45 MEQ/L-%-% IV SOLN
INTRAVENOUS | Status: DC
  Administered 2015-08-02 – 2015-08-03 (×2): via INTRAVENOUS
  Administered 2015-08-03: 1000 mL via INTRAVENOUS
  Administered 2015-08-04 – 2015-08-05 (×3): via INTRAVENOUS
  Administered 2015-08-07: 1000 mL via INTRAVENOUS
  Filled 2015-08-02 (×15): qty 1000

## 2015-08-02 MED ORDER — BUPIVACAINE ON-Q PAIN PUMP (FOR ORDER SET NO CHG)
INJECTION | Status: AC
  Filled 2015-08-02: qty 1

## 2015-08-02 MED ORDER — LIDOCAINE HCL 1 % IJ SOLN
INTRAMUSCULAR | Status: DC | PRN
  Administered 2015-08-02: 7.5 mL

## 2015-08-02 MED ORDER — DIPHENHYDRAMINE HCL 50 MG/ML IJ SOLN: 12.5000 mg | Freq: Four times a day (QID) | INTRAMUSCULAR | Status: DC | PRN

## 2015-08-02 MED ORDER — ALBUTEROL SULFATE HFA 108 (90 BASE) MCG/ACT IN AERS: 2.0000 | INHALATION_SPRAY | RESPIRATORY_TRACT | Status: DC | PRN

## 2015-08-02 MED ORDER — CEFOTETAN DISODIUM-DEXTROSE 2-2.08 GM-% IV SOLR
INTRAVENOUS | Status: AC
  Filled 2015-08-02: qty 50

## 2015-08-02 MED ORDER — PROPOFOL 10 MG/ML IV BOLUS
INTRAVENOUS | Status: DC | PRN
  Administered 2015-08-02: 150 mg via INTRAVENOUS

## 2015-08-02 MED ORDER — DEXTROSE 5 % IV SOLN: 2.0000 g | INTRAVENOUS | Status: DC

## 2015-08-02 MED ORDER — LIDOCAINE HCL 1 % IJ SOLN
INTRAMUSCULAR | Status: AC
  Filled 2015-08-02: qty 20

## 2015-08-02 MED ORDER — GLYCOPYRROLATE 0.2 MG/ML IJ SOLN
INTRAMUSCULAR | Status: AC
  Filled 2015-08-02: qty 1

## 2015-08-02 MED ORDER — CHLORHEXIDINE GLUCONATE CLOTH 2 % EX PADS: 6.0000 | MEDICATED_PAD | Freq: Once | CUTANEOUS | Status: DC

## 2015-08-02 MED ORDER — EPHEDRINE SULFATE 50 MG/ML IJ SOLN
INTRAMUSCULAR | Status: DC | PRN
  Administered 2015-08-02 (×5): 5 mg via INTRAVENOUS

## 2015-08-02 MED ORDER — LACTATED RINGERS IR SOLN
Status: DC | PRN
  Administered 2015-08-02: 1000 mL

## 2015-08-02 MED ORDER — HYDROMORPHONE HCL 1 MG/ML IJ SOLN
INTRAMUSCULAR | Status: AC
  Filled 2015-08-02: qty 1

## 2015-08-02 MED ORDER — SODIUM CHLORIDE 0.9% FLUSH: 9.0000 mL | INTRAVENOUS | Status: DC | PRN

## 2015-08-02 MED ORDER — SUCCINYLCHOLINE CHLORIDE 20 MG/ML IJ SOLN
INTRAMUSCULAR | Status: DC | PRN
  Administered 2015-08-02: 100 mg via INTRAVENOUS

## 2015-08-02 MED ORDER — ACETAMINOPHEN 325 MG PO TABS
650.0000 mg | ORAL_TABLET | Freq: Four times a day (QID) | ORAL | Status: DC | PRN
  Administered 2015-08-07: 650 mg via ORAL
  Filled 2015-08-02: qty 2

## 2015-08-02 MED ORDER — BUPIVACAINE-EPINEPHRINE (PF) 0.25% -1:200000 IJ SOLN
INTRAMUSCULAR | Status: AC
  Filled 2015-08-02: qty 30

## 2015-08-02 MED ORDER — ALBUTEROL SULFATE (2.5 MG/3ML) 0.083% IN NEBU
INHALATION_SOLUTION | RESPIRATORY_TRACT | Status: AC
  Filled 2015-08-02: qty 3

## 2015-08-02 MED ORDER — ONDANSETRON HCL 4 MG/2ML IJ SOLN
4.0000 mg | Freq: Four times a day (QID) | INTRAMUSCULAR | Status: DC | PRN
  Filled 2015-08-02: qty 2

## 2015-08-02 MED ORDER — PROPOFOL 10 MG/ML IV BOLUS
INTRAVENOUS | Status: AC
  Filled 2015-08-02: qty 20

## 2015-08-02 MED ORDER — HYDROMORPHONE 1 MG/ML IV SOLN
INTRAVENOUS | Status: DC
  Administered 2015-08-02: 17:00:00 via INTRAVENOUS
  Administered 2015-08-02: 0.9 mg via INTRAVENOUS
  Administered 2015-08-03: 0.8 mg via INTRAVENOUS
  Administered 2015-08-03: 1.8 mg via INTRAVENOUS
  Administered 2015-08-03: 2.8 mg via INTRAVENOUS
  Administered 2015-08-03: 1 mg via INTRAVENOUS
  Administered 2015-08-03: 1.8 mg via INTRAVENOUS
  Administered 2015-08-03: 2.6 mg via INTRAVENOUS
  Administered 2015-08-04: 1.2 mg via INTRAVENOUS
  Administered 2015-08-04: 0.8 mg via INTRAVENOUS
  Administered 2015-08-04: 1.4 mg via INTRAVENOUS
  Administered 2015-08-04 (×2): 0.4 mg via INTRAVENOUS
  Administered 2015-08-05: 1 mg via INTRAVENOUS
  Administered 2015-08-05: 1.8 mg via INTRAVENOUS
  Administered 2015-08-05: 1.6 mg via INTRAVENOUS
  Administered 2015-08-05: 2.8 mg via INTRAVENOUS
  Administered 2015-08-05: 0.4 mg via INTRAVENOUS
  Administered 2015-08-05: 1 mg via INTRAVENOUS
  Administered 2015-08-05: 2.2 mg via INTRAVENOUS
  Administered 2015-08-06: 0.2 mg via INTRAVENOUS
  Administered 2015-08-06: 0 mg via INTRAVENOUS
  Filled 2015-08-02: qty 25

## 2015-08-02 MED ORDER — ONDANSETRON HCL 4 MG/2ML IJ SOLN
INTRAMUSCULAR | Status: AC
  Filled 2015-08-02: qty 2

## 2015-08-02 MED ORDER — DIPHENHYDRAMINE HCL 12.5 MG/5ML PO ELIX: 12.5000 mg | ORAL_SOLUTION | Freq: Four times a day (QID) | ORAL | Status: DC | PRN

## 2015-08-02 MED ORDER — LIDOCAINE HCL (CARDIAC) 20 MG/ML IV SOLN
INTRAVENOUS | Status: DC | PRN
  Administered 2015-08-02: 80 mg via INTRATRACHEAL

## 2015-08-02 MED ORDER — LACTATED RINGERS IV SOLN
INTRAVENOUS | Status: DC
  Administered 2015-08-02 (×2): via INTRAVENOUS
  Administered 2015-08-02: 1000 mL via INTRAVENOUS
  Administered 2015-08-02: 12:00:00 via INTRAVENOUS

## 2015-08-02 MED ORDER — METHOCARBAMOL 500 MG PO TABS: 500.0000 mg | ORAL_TABLET | Freq: Four times a day (QID) | ORAL | Status: DC | PRN

## 2015-08-02 MED ORDER — FENTANYL CITRATE (PF) 250 MCG/5ML IJ SOLN
INTRAMUSCULAR | Status: AC
  Filled 2015-08-02: qty 5

## 2015-08-02 MED ORDER — FAMOTIDINE IN NACL 20-0.9 MG/50ML-% IV SOLN
20.0000 mg | Freq: Two times a day (BID) | INTRAVENOUS | Status: DC
  Administered 2015-08-02 – 2015-08-09 (×14): 20 mg via INTRAVENOUS
  Filled 2015-08-02 (×16): qty 50

## 2015-08-02 SURGICAL SUPPLY — 86 items
APPLIER CLIP 5 13 M/L LIGAMAX5 (MISCELLANEOUS)
APPLIER CLIP ROT 10 11.4 M/L (STAPLE)
BLADE EXTENDED COATED 6.5IN (ELECTRODE) IMPLANT
BLADE HEX COATED 2.75 (ELECTRODE) ×3 IMPLANT
CABLE HIGH FREQUENCY MONO STRZ (ELECTRODE) IMPLANT
CATH KIT ON-Q SILVERSOAK 7.5IN (CATHETERS) ×6 IMPLANT
CELLS DAT CNTRL 66122 CELL SVR (MISCELLANEOUS) IMPLANT
CHLORAPREP W/TINT 26ML (MISCELLANEOUS) ×3 IMPLANT
CLIP APPLIE 5 13 M/L LIGAMAX5 (MISCELLANEOUS) IMPLANT
CLIP APPLIE ROT 10 11.4 M/L (STAPLE) IMPLANT
CLIP LIGATING HEMO O LOK GREEN (MISCELLANEOUS) ×3 IMPLANT
COUNTER NEEDLE 20 DBL MAG RED (NEEDLE) ×3 IMPLANT
COVER MAYO STAND STRL (DRAPES) ×18 IMPLANT
COVER SURGICAL LIGHT HANDLE (MISCELLANEOUS) ×6 IMPLANT
DECANTER SPIKE VIAL GLASS SM (MISCELLANEOUS) ×3 IMPLANT
DRAIN CHANNEL 19F RND (DRAIN) IMPLANT
DRAPE LAPAROSCOPIC ABDOMINAL (DRAPES) ×3 IMPLANT
DRSG OPSITE POSTOP 4X10 (GAUZE/BANDAGES/DRESSINGS) IMPLANT
DRSG OPSITE POSTOP 4X6 (GAUZE/BANDAGES/DRESSINGS) IMPLANT
DRSG OPSITE POSTOP 4X8 (GAUZE/BANDAGES/DRESSINGS) ×3 IMPLANT
DRSG TEGADERM 2-3/8X2-3/4 SM (GAUZE/BANDAGES/DRESSINGS) ×15 IMPLANT
ELECT PENCIL ROCKER SW 15FT (MISCELLANEOUS) ×6 IMPLANT
ELECT REM PT RETURN 9FT ADLT (ELECTROSURGICAL) ×3
ELECTRODE REM PT RTRN 9FT ADLT (ELECTROSURGICAL) ×1 IMPLANT
ENDOLOOP SUT PDS II  0 18 (SUTURE)
ENDOLOOP SUT PDS II 0 18 (SUTURE) IMPLANT
EVACUATOR SILICONE 100CC (DRAIN) IMPLANT
GAUZE SPONGE 2X2 8PLY STRL LF (GAUZE/BANDAGES/DRESSINGS) ×1 IMPLANT
GAUZE SPONGE 4X4 12PLY STRL (GAUZE/BANDAGES/DRESSINGS) ×3 IMPLANT
GLOVE BIO SURGEON STRL SZ 6 (GLOVE) ×6 IMPLANT
GLOVE INDICATOR 6.5 STRL GRN (GLOVE) ×6 IMPLANT
GOWN STRL REUS W/ TWL XL LVL3 (GOWN DISPOSABLE) ×6 IMPLANT
GOWN STRL REUS W/TWL XL LVL3 (GOWN DISPOSABLE) ×12
LEGGING LITHOTOMY PAIR STRL (DRAPES) ×3 IMPLANT
PACK COLON (CUSTOM PROCEDURE TRAY) ×3 IMPLANT
PAD POSITIONING PINK XL (MISCELLANEOUS) ×3 IMPLANT
PORT LAP GEL ALEXIS MED 5-9CM (MISCELLANEOUS) ×3 IMPLANT
RELOAD PROXIMATE 75MM BLUE (ENDOMECHANICALS) ×6 IMPLANT
RELOAD PROXIMATE 75MM GREEN (ENDOMECHANICALS) ×6 IMPLANT
RELOAD STAPLER GREEN 60MM (STAPLE) ×1 IMPLANT
RTRCTR WOUND ALEXIS 18CM MED (MISCELLANEOUS)
SCISSORS LAP 5X35 DISP (ENDOMECHANICALS) ×3 IMPLANT
SEALER TISSUE G2 STRG ARTC 35C (ENDOMECHANICALS) IMPLANT
SET IRRIG TUBING LAPAROSCOPIC (IRRIGATION / IRRIGATOR) ×3 IMPLANT
SHEARS HARMONIC HDI 36CM (ELECTROSURGICAL) ×3 IMPLANT
SLEEVE SURGEON STRL (DRAPES) IMPLANT
SLEEVE XCEL OPT CAN 5 100 (ENDOMECHANICALS) ×6 IMPLANT
SPONGE GAUZE 2X2 STER 10/PKG (GAUZE/BANDAGES/DRESSINGS) ×2
SPONGE LAP 18X18 X RAY DECT (DISPOSABLE) ×9 IMPLANT
STAPLE ECHEON FLEX 60 POW ENDO (STAPLE) ×3 IMPLANT
STAPLER CIRC CVD 29MM 37CM (STAPLE) ×3 IMPLANT
STAPLER GUN LINEAR PROX 60 (STAPLE) IMPLANT
STAPLER PROXIMATE 75MM BLUE (STAPLE) ×6 IMPLANT
STAPLER RELOAD GREEN 60MM (STAPLE) ×3
STAPLER VISISTAT 35W (STAPLE) ×15 IMPLANT
SUT ETHILON 2 0 PS N (SUTURE) IMPLANT
SUT MNCRL AB 4-0 PS2 18 (SUTURE) ×6 IMPLANT
SUT PDS AB 1 CTX 36 (SUTURE) IMPLANT
SUT PDS AB 1 TP1 96 (SUTURE) ×6 IMPLANT
SUT PDS AB 3-0 SH 27 (SUTURE) ×9 IMPLANT
SUT PROLENE 0 SH 30 (SUTURE) ×3 IMPLANT
SUT SILK 2 0 (SUTURE) ×6
SUT SILK 2 0 SH CR/8 (SUTURE) ×3 IMPLANT
SUT SILK 2-0 18XBRD TIE 12 (SUTURE) ×3 IMPLANT
SUT SILK 3 0 (SUTURE)
SUT SILK 3 0 SH CR/8 (SUTURE) ×6 IMPLANT
SUT SILK 3-0 18XBRD TIE 12 (SUTURE) IMPLANT
SUT VIC AB 2-0 SH 18 (SUTURE) ×3 IMPLANT
SUT VIC AB 3-0 SH 18 (SUTURE) ×3 IMPLANT
SUT VICRYL 0 UR6 27IN ABS (SUTURE) ×3 IMPLANT
SUT VICRYL 2 0 18  UND BR (SUTURE) ×2
SUT VICRYL 2 0 18 UND BR (SUTURE) ×1 IMPLANT
SUT VICRYL 3 0 BR 18  UND (SUTURE) ×2
SUT VICRYL 3 0 BR 18 UND (SUTURE) ×1 IMPLANT
SYS LAPSCP GELPORT 120MM (MISCELLANEOUS)
SYSTEM LAPSCP GELPORT 120MM (MISCELLANEOUS) IMPLANT
TOWEL OR 17X26 10 PK STRL BLUE (TOWEL DISPOSABLE) ×3 IMPLANT
TOWEL OR NON WOVEN STRL DISP B (DISPOSABLE) ×3 IMPLANT
TRAY FOLEY W/METER SILVER 14FR (SET/KITS/TRAYS/PACK) IMPLANT
TRAY FOLEY W/METER SILVER 16FR (SET/KITS/TRAYS/PACK) ×3 IMPLANT
TROCAR BLADELESS OPT 5 100 (ENDOMECHANICALS) ×3 IMPLANT
TROCAR XCEL 12X100 BLDLESS (ENDOMECHANICALS) ×3 IMPLANT
TROCAR XCEL BLUNT TIP 100MML (ENDOMECHANICALS) IMPLANT
TROCAR XCEL NON-BLD 11X100MML (ENDOMECHANICALS) IMPLANT
TUBING INSUF HEATED (TUBING) ×3 IMPLANT
TUNNELER SHEATH ON-Q 16GX12 DP (PAIN MANAGEMENT) ×3 IMPLANT

## 2015-08-02 NOTE — Op Note (Signed)
Laparoscopic Total Abdominal colectomy with ileorectal anastamosis and open distal gastrectomy and billroth II reconstruction   Indications: This patient presents with synchronous cecal cancer, large polyps at hepatic flexure and low sigmoid, other adenomatous colon polyps, and gastric cancer at the antrum.    Pre-operative Diagnosis:  Colon cancer, multiple polyps, and gastric cancer  Post-operative Diagnosis:   Surgeon: Stark Klein   Assistants: Alphonsa Overall, MD  Anesthesia:General and local  ASA Class: 3  Procedure Details  The patient was seen in the Holding Room. The risks, benefits, complications, treatment options, and expected outcomes were discussed with the patient. The possibilities of reaction to medication, perforation of viscus, bleeding, recurrent infection, finding a normal colon, the need for additional procedures, failure to diagnose a condition, and creating a complication requiring transfusion or operation were discussed with the patient. The patient was advised of the risk of ostomy.  The patient concurred with the proposed plan, giving informed consent.   The patient was taken to the operating room, identified, and the procedure verified. The patient was placed supine. After induction of a general anesthetic, a Foley catheter was inserted and the abdomen was prepped and draped in standard fashion. A Time Out was held and the above information confirmed.  Local anesthetic was infiltrated at the left costal margin.  The patient was placed in reverse trendelenburg and rotated to the right. The skin was incised with a #11 blade. The 5 mm optiview trocar was placed under direct visualization.  Pneumoperitoneum was insufflated to a pressure of 15 mm Hg. The laparoscope was introduced.    Exploration revealed a normal omentum, small bowel, peritoneum, liver, and stomach. No carcinomatosis was seen. Additional 5 mm trocars were placed; two in the left lateral abdomen, one in the  right upper quadrant, and one below the umbilicus.  The ascending colon and hepatic flexure were then mobilized with gentle retraction of the colon in a medial direction with mobilization of the peritoneal reflection with cautery and the harmonic scalpel. The omentum was taken off the transverse colon.   The splenic flexure was taken down, and the descending colon was mobilized.  The left ureter was identified and avoided.  The tattoo in the distal sigmoid was identified.  The proximal rectum was mobilized.  A hand port was placed in the upper abdomen in order to assist with the distal gastrectomy.    The terminal ileum was divided with a GIA 75 mm stapler.  The mesocolon was divided high up on the mesentery.  This was done all the way around the colon.  The rectum was very thick with the mesorectum.  The colon was divided around the area of the proximal sigmoid.  The distal sigmoid was returned to the abdomen.  The abdomen was reinsufflated.  The RLQ trocar was upsized to a 12 mm trocar.  The rectum was pulled up with the hand via the hand port.  The mesorectum was thinned out with the harmonic.  The superior hemorrhoidal was divided.  The echelon stapler was used to divide the proximal rectum beyond the tattoo.  Both colon specimens were passed off and submitted to pathology.      An end-to-end anastomosis was performed with the terminal ileum and the rectum with the EEA technique.  The ileum was opened and a 0-0 prolene was used to create a pursestring suture around it.  The anvil of the 29 mm EEA was placed and the pursestring secured.  The pneumoperitoneum was reachieved.  The stapling  device was advanced through the anus and the spike was deployed under visualization.  The anvil was coupled and the stapler was closed down, taking care to ensure the bowel was not twisted and that no adjacent tissue was caught in the staple line.    The stapler was fired.  Pressure was held for 45 seconds.  The stapler was  gently removed.  The anastamotic rings were intact.  The proctoscope was used to assess for bleeding.  Irrigant was placed in the pelvis and the ileum was clamped gently.  The rectum was insufflated.  No leakage was seen.    The colon protocol was followed and new drapes were placed. The suction and cautery were passed off the table. Attention was then directed to the stomach. The upper abdominal hand port was enlarged superiorly. The omentum was used to pull the stomach down. The duodenum was kocherized with the cautery. The mass was easily palpable just proximal to the pylorus. The tissue adjacent to the stomach was clamped and tied and ligated. The antrum was divided with the GIA-75 millimeter stapler with a green load 2. The proximal staple line was oversewn with a 3-0 PDS. The tissue on the lesser curve was taken down by clamping cutting and ligating.  The omentum at the proximal duodenum was also clamped and tied. The proximal duodenum was divided with the GIA-75 stapler. The antrum was passed off the table. The abdomen was irrigated. There was some clot in the right lower quadrant was irrigated. This would be reinspected later.  A gastrojejunostomy was created and the Billroth II fashion by taking the jejunum just distal to the ligament of Treitz and securing it to the posterior stomach with a stapled anastomosis. The defect was closed with two 3-0 PDS sutures.  Prior to closing the defect, the NG tube was passed into the efferent limb of the gastrojejunostomy and secured.  The proximal and distal ends of the staple line were reinforced with 2-0 silk sutures. The abdomen was once again irrigated. There was no sign of bilious leakage or bleeding. On-Q catheters were placed on either side of the upper midline incision. The fascia of the 12 mm trocar site was closed with 0 Vicryl in figure-of-eight fashion. A midline was then closed using running #1 looped PDS suture. The skin was then irrigated and closed  with staples. On-Q catheters were advanced through the tunnelers and  secured in place.  Sterile dressings were applied.  Instrument, sponge, and needle counts were correct x 2 prior to abdominal closure and at the conclusion of the case.   Findings: cecal cancer, large polyp at rectosigmoid.  Mass at distal stomach.  No carcinomatosis.    Estimated Blood Loss: less than 100 mL           Specimens: proximal colon, distal colon, anastamotic rings, distal stomach            Complications: None; patient tolerated the procedure well.         Disposition: PACU - hemodynamically stable.         Condition: stable

## 2015-08-02 NOTE — Transfer of Care (Signed)
Immediate Anesthesia Transfer of Care Note  Patient: Steven Drake  Procedure(s) Performed: Procedure(s): LAPAROSCOPIC SUBTOTAL COLECTOMY  AND DISTAL GASTRECTOMY  (N/A)  Patient Location: PACU  Anesthesia Type:General  Level of Consciousness: sedated, patient cooperative and responds to stimulation  Airway & Oxygen Therapy: Patient Spontanous Breathing and Patient connected to face mask oxygen  Post-op Assessment: Report given to RN and Post -op Vital signs reviewed and stable  Post vital signs: Reviewed and stable  Last Vitals:  Filed Vitals:   08/02/15 0725  BP: 140/70  Pulse: 60  Temp: 36.6 C  Resp: 16    Last Pain: There were no vitals filed for this visit.    Patients Stated Pain Goal: 3 ([id number])  Complications: No apparent anesthesia complications

## 2015-08-02 NOTE — Anesthesia Preprocedure Evaluation (Addendum)
Anesthesia Evaluation  Patient identified by MRN, date of birth, ID band Patient awake    Reviewed: Allergy & Precautions, NPO status , Patient's Chart, lab work & pertinent test results  Airway Mallampati: II  TM Distance: >3 FB Neck ROM: Full    Dental  (+) Edentulous Upper, Edentulous Lower   Pulmonary Current Smoker,    breath sounds clear to auscultation       Cardiovascular hypertension, Pt. on medications + CAD and + Past MI   Rhythm:Regular Rate:Normal     Neuro/Psych negative neurological ROS     GI/Hepatic Neg liver ROS, hiatal hernia, PUD, GERD  ,  Endo/Other  negative endocrine ROS  Renal/GU Renal InsufficiencyRenal disease     Musculoskeletal   Abdominal   Peds  Hematology  (+) anemia ,   Anesthesia Other Findings   Reproductive/Obstetrics                            Lab Results  Component Value Date   WBC 10.1 07/29/2015   HGB 10.7* 07/29/2015   HCT 34.0* 07/29/2015   MCV 88.3 07/29/2015   PLT 211 07/29/2015   Lab Results  Component Value Date   CREATININE 1.22 07/29/2015   BUN 16 07/29/2015   NA 138 07/29/2015   K 3.9 07/29/2015   CL 110 07/29/2015   CO2 24 07/29/2015    Anesthesia Physical Anesthesia Plan  ASA: III  Anesthesia Plan: General   Post-op Pain Management:    Induction: Intravenous  Airway Management Planned: Oral ETT  Additional Equipment:   Intra-op Plan:   Post-operative Plan: Extubation in OR  Informed Consent: I have reviewed the patients History and Physical, chart, labs and discussed the procedure including the risks, benefits and alternatives for the proposed anesthesia with the patient or authorized representative who has indicated his/her understanding and acceptance.   Dental advisory given  Plan Discussed with: CRNA  Anesthesia Plan Comments:         Anesthesia Quick Evaluation

## 2015-08-02 NOTE — Anesthesia Procedure Notes (Signed)
Procedure Name: Intubation Date/Time: 08/02/2015 11:04 AM Performed by: Dione Booze Pre-anesthesia Checklist: Emergency Drugs available, Suction available, Patient being monitored and Patient identified Patient Re-evaluated:Patient Re-evaluated prior to inductionOxygen Delivery Method: Circle system utilized Preoxygenation: Pre-oxygenation with 100% oxygen Intubation Type: IV induction Laryngoscope Size: Mac and 4 Grade View: Grade I Tube type: Oral Tube size: 7.5 mm Number of attempts: 1 Airway Equipment and Method: Stylet Placement Confirmation: ETT inserted through vocal cords under direct vision and positive ETCO2 Secured at: 22 cm Tube secured with: Tape Dental Injury: Teeth and Oropharynx as per pre-operative assessment

## 2015-08-02 NOTE — H&P (Signed)
Steven Drake 07/09/2015 11:52 AM Location: [facility] Patient #: [id number] DOB: 1945-10-10 Widowed / Language: Steven Drake / Race: White Male   The patient is a 70 year old male.  Other Problems Steven Drake, CMA; 07/09/2015 11:52 AM) Gastroesophageal Reflux Disease High blood pressure Hypercholesterolemia Myocardial infarction  Past Surgical History Steven Drake, CMA; 07/09/2015 11:52 AM) Appendectomy Cataract Surgery Bilateral. Colon Polyp Removal - Colonoscopy  Diagnostic Studies History Steven Drake, CMA; 07/09/2015 11:52 AM) Colonoscopy within last year  Allergies Steven Drake, CMA; 07/09/2015 11:53 AM) Iodinated Glycerol *COUGH/COLD/ALLERGY*  Medication History Steven Drake, CMA; 07/09/2015 11:53 AM) Atorvastatin Calcium (40MG  Tablet, Oral) Active. Clopidogrel Bisulfate (75MG  Tablet, Oral) Active. Pantoprazole Sodium (40MG  Tablet DR, Oral) Active. Lisinopril (5MG  Tablet, Oral) Active. Medications Reconciled  Social History Steven Drake, [place]; 07/09/2015 11:52 AM) Alcohol use Remotely quit alcohol use. Caffeine use Carbonated beverages. No drug use Tobacco use Current every day smoker.  Family History Steven Drake, [place]; 07/09/2015 11:52 AM) Heart Disease Mother.  History Addendum Note(Steven Fluegge MD; 07/09/2015 2:23 PM) Patient is a 70 year old male who presented with anemia. He had positive findings in both the EGD and colonoscopy. He had a nodule in the anterior stomach that originally was suspicious for invasive adenocarcinoma but not diagnostic. His follow-up EGD was positive for adenocarcinoma. He also had a colonoscopy. He had a large cecal mass, a large carpeted polyp at the hepatic flexure, a large polyp in the mid sigmoid that was not completely removed, and a polyp in the descending colon. He did not have abdominal pain. He does get around well at home. He is "wide open". He is getting ready to harvest his hay. He denies  any significant chest pain or shortness of breath. He has had some polyps that have been followed on CT due to his smoking history. These are not significantly changed.  PET scan 5/1 IMPRESSION: 1. Intensely hypermetabolic cecal mass. No definitive evidence of metastatic disease. Scattered upper lobe predominant pulmonary nodules are too small for PET resolution. 2. Three-vessel coronary artery calcification.   CEA 8.0 HCT 31.9 Cr 1.32  Pathology 4/20 Stomach, biopsy, pyloric lesion - INVASIVE POORLY DIFFERENTIATED ADENOCARCINOMA WITH SIGNET RING CELL FEATURES, ARISING IN THE BACKGROUND OF ATROPHIC GASTRITIS WITH INTESTINAL METAPLASIA. - SEE COMMENT.  Path 4/13 1. Stomach, biopsy, pyloric channel SUBMUCOSAL SMALL FOCUS OF ATYPICAL GLANDS, SUSPICIOUS FOR ADENOCARCINOMA EROSION WITH INTESTINAL METAPLASIA NEUROENDOCRINE CELL HYPERPLASIA 2. Stomach, biopsy BENIGN GASTRIC MUCOSA NO HELICOBACTER PYLORI ORGANISMS ARE IDENTIFIED (WARTHIN-STARRY STAIN) NEGATIVE FOR MALIGNANCY 3. Colon, biopsy, cecal mass ADENOCARCINOMA 4. Colon, polyp(s), hepatic flexure FOCI OF ADENOCARCINOMA ARISING FROM THE BACKGROUND OF TUBULOVILLOUS ADENOMA 5. Colon, polyp(s), descending - TUBULAR ADENOMA(S). - HIGH GRADE DYSPLASIA IS NOT IDENTIFIED. 6. Colon, polyp(s), sigmoid TUBULOVILLOUS ADENOMA WITH AT LEAST HIGH GRADE DYSPLASIA CANNOT RULE OUT FOCAL ADENOCARCINOMA ' upper endoscopy - Low-grade of narrowing Schatzki ring. Dilated. Abnormal pylorus channel?biopsied - Normal second portion of the duodenum. - The examination was otherwise normal.  colonoscopy Cecal neoplasm?biopsied. Large polyp at hepatic flexure removed with piecemeal polypectomy and APC ablation. Lesion tattooed. Large polyp in the mid sigmoid status - post piecemeal hot snare debulking and tattooing.?This lesion not completely removed. Scattered pancolonic diverticulosis.- No specimens collected.    Review of Systems Steven Drake CMA; 07/09/2015 11:52 AM) General Not Present- Appetite Loss, Chills, Fatigue, Fever, Night Sweats, Weight Gain and Weight Loss. Skin Not Present- Change in Wart/Mole, Dryness, Hives, Jaundice, New Lesions, Non-Healing Wounds, Rash and Ulcer. HEENT Not Present- Earache, Hearing  Loss, Hoarseness, Nose Bleed, Oral Ulcers, Ringing in the Ears, Seasonal Allergies, Sinus Pain, Sore Throat, Visual Disturbances, Wears glasses/contact lenses and Yellow Eyes. Respiratory Not Present- Bloody sputum, Chronic Cough, Difficulty Breathing, Snoring and Wheezing. Breast Not Present- Breast Mass, Breast Pain, Nipple Discharge and Skin Changes. Gastrointestinal Not Present- Abdominal Pain, Bloating, Bloody Stool, Change in Bowel Habits, Chronic diarrhea, Constipation, Difficulty Swallowing, Excessive gas, Gets full quickly at meals, Hemorrhoids, Indigestion, Nausea, Rectal Pain and Vomiting. Male Genitourinary Not Present- Blood in Urine, Change in Urinary Stream, Frequency, Impotence, Nocturia, Painful Urination, Urgency and Urine Leakage. Neurological Not Present- Decreased Memory, Fainting, Headaches, Numbness, Seizures, Tingling, Tremor, Trouble walking and Weakness. Psychiatric Not Present- Anxiety, Bipolar, Change in Sleep Pattern, Depression, Fearful and Frequent crying. Hematology Present- Easy Bruising. Not Present- Excessive bleeding, Gland problems, HIV and Persistent Infections.  Vitals Steven Drake CMA; 07/09/2015 11:54 AM) 07/09/2015 11:53 AM Weight: 173 lb Height: 69in Body Surface Area: 1.94 m Body Mass Index: 25.55 kg/m  Temp.: 97.64F(Temporal)  Pulse: 88 (Regular)  BP: 150/78 (Sitting, Left Arm, Standard)       Assessment & Plan Steven Klein MD; 07/09/2015 2:15 PM) MALIGNANT NEOPLASM OF COLON, UNSPECIFIED PART OF COLON (C18.9) Impression: Because of the distribution of polyps and the cancer, the patient will need a subtotal colectomy. I will do this with laparoscopic  assistance. I think the gastric cancer portion will have to be open, but at least we can hopefully prevent him having a very long incision.  I reviewed the risks of bleeding, infection, damage to adjacent structures, colon leak, possible ostomy, diarrhea, wound infection, hernia, heart or lung complications, possible death. Pt understands and wishes to proceed.  45 min spent in evaluation, examination, counseling, and coordination of care. >50% spent in counseling. Current Plans Started Flagyl 500MG , 2 (two) Tablet SEE NOTE, #6, 07/09/2015, No Refill. Local Order: Take at 2pm, 3pm, and 10pm the day prior to your colon operation Started Neomycin Sulfate 500MG , 2 (two) Tablet SEE NOTE, #6, 07/09/2015, No Refill. Local Order: TAKE TWO TABLETS AT 2 PM, 3 PM, AND 10 PM THE DAY PRIOR TO SURGERY Pt Education - [facility] Colon Bowel Prep 2015 Miralax/Antibiotics Pt Education - Pamphlet Given - Colorectal Surgery: discussed with patient and provided information. 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 I reviewed diagrams and anatomy. I discussed risks and benefits of surgery including bleeding, infection, damage to adjacent structures, possible leak, possible prolonged nausea and vomiting, weight loss, hernia, heart and lung complications, possible need for a larger incision, and death.  This does likes like a isolated stomach cancer other than a large cancer. Current Plans Pt Education - [facility] STOP SMOKING! You are being scheduled for surgery - Our schedulers will call you.  You should hear from our office's scheduling department within 5 working days about the location, date, and time of surgery. We try to make accommodations for patient's preferences in scheduling surgery, but sometimes the OR schedule or the surgeon's schedule prevents [name] from making those accommodations.  If you have not heard from our office [phone]) in 5 working days, call the office and ask for  your surgeon's nurse.  If you have other questions about your diagnosis, plan, or surgery, call the office and ask for your surgeon's nurse.  FAMILY HISTORY OF COLON CANCER (Z80.0) Impression: I'm highly suspicious that the patient may have Lynch syndrome due to having colon cancer in multiple sites, synchronous colon and stomach cancer, and 2 first-degree relatives with colon  cancer in earlier decades.Burnis Medin send him for genetic counseling. Current Plans Referred to Genetic Counseling, for evaluation and follow up (Medical Genetics). Routine. TOBACCO ABUSE COUNSELING (Z71.6) Impression: I reviewed that his surgical risk is much higher if he continues to smoke.  Discussed quitting wtih patient and family.    Signed by Steven Klein, MD (07/09/2015 2:15 PM)

## 2015-08-02 NOTE — Interval H&P Note (Signed)
History and Physical Interval Note:  08/02/2015 10:54 AM  Steven Drake  has presented today for surgery, with the diagnosis of Colon cancer in multiple sites antral cancer   The various methods of treatment have been discussed with the patient and family. After consideration of risks, benefits and other options for treatment, the patient has consented to  Procedure(s): LAPAROSCOPIC SUBTOTAL COLECTOMY  AND DISTAL GASTRECTOMY  (N/A) as a surgical intervention .  The patient's history has been reviewed, patient examined, no change in status, stable for surgery.  I have reviewed the patient's chart and labs.  Questions were answered to the patient's satisfaction.     Sarina Robleto

## 2015-08-02 NOTE — Anesthesia Postprocedure Evaluation (Signed)
Anesthesia Post Note  Patient: ADHRIT CACCAVALE  Procedure(s) Performed: Procedure(s) (LRB): LAPAROSCOPIC SUBTOTAL COLECTOMY  AND DISTAL GASTRECTOMY  (N/A)  Patient location during evaluation: PACU Anesthesia Type: General Level of consciousness: awake and alert Pain management: pain level controlled Vital Signs Assessment: post-procedure vital signs reviewed and stable Respiratory status: spontaneous breathing, nonlabored ventilation, respiratory function stable and patient connected to nasal cannula oxygen Cardiovascular status: blood pressure returned to baseline and stable Postop Assessment: no signs of nausea or vomiting Anesthetic complications: no    Last Vitals:  Filed Vitals:   08/02/15 1733 08/02/15 1734  BP:  161/65  Pulse:  75  Temp: 36.6 C   Resp:  11    Last Pain:  Filed Vitals:   08/02/15 1737  PainSc: Asleep                 Tiajuana Amass

## 2015-08-03 LAB — BASIC METABOLIC PANEL
ANION GAP: 7 (ref 5–15)
BUN: 18 mg/dL (ref 6–20)
CO2: 24 mmol/L (ref 22–32)
Calcium: 8 mg/dL — ABNORMAL LOW (ref 8.9–10.3)
Chloride: 106 mmol/L (ref 101–111)
Creatinine, Ser: 1.21 mg/dL (ref 0.61–1.24)
GFR, EST NON AFRICAN AMERICAN: 58 mL/min — AB (ref >60)
Glucose, Bld: 205 mg/dL — ABNORMAL HIGH (ref 65–99)
POTASSIUM: 4.1 mmol/L (ref 3.5–5.1)
SODIUM: 137 mmol/L (ref 135–145)

## 2015-08-03 LAB — CBC
HCT: 28.9 % — ABNORMAL LOW (ref 39.0–52.0)
Hemoglobin: 9.4 g/dL — ABNORMAL LOW (ref 13.0–17.0)
MCH: 28.2 pg (ref 26.0–34.0)
MCHC: 32.5 g/dL (ref 30.0–36.0)
MCV: 86.8 fL (ref 78.0–100.0)
PLATELETS: 236 10*3/uL (ref 150–400)
RBC: 3.33 MIL/uL — AB (ref 4.22–5.81)
RDW: 17.5 % — ABNORMAL HIGH (ref 11.5–15.5)
WBC: 12.1 10*3/uL — AB (ref 4.0–10.5)

## 2015-08-03 MED ORDER — ACETAMINOPHEN 10 MG/ML IV SOLN
1000.0000 mg | Freq: Four times a day (QID) | INTRAVENOUS | Status: AC
  Administered 2015-08-03 – 2015-08-04 (×4): 1000 mg via INTRAVENOUS
  Filled 2015-08-03 (×5): qty 100

## 2015-08-03 NOTE — Progress Notes (Signed)
1 Day Post-Op  Subjective: Some belching and hiccups.  No nausea.  PCA helps him fall asleep.  C/o being sore.    Objective: Vital signs in last 24 hours: Temp:  [97.9 F (36.6 C)-100 F (37.8 C)] 98.3 F (36.8 C) (06/06 1449) Pulse Rate:  [49-80] 49 (06/06 1449) Resp:  [8-36] 8 (06/06 1516) BP: (143-170)/(60-79) 170/65 mmHg (06/06 1449) SpO2:  [95 %-100 %] 100 % (06/06 1516) FiO2 (%):  [38 %-45 %] 45 % (06/06 0412) Last BM Date:  (PTA)  Intake/Output from previous day: 06/05 0701 - 06/06 0700 In: 4220 [I.V.:4200; NG/GT:20] Out: 1500 [Urine:1200; Blood:300] Intake/Output this shift: Total I/O In: 810 [I.V.:800; NG/GT:10] Out: 500 [Urine:500]  General appearance: alert, cooperative and no distress Head: Normocephalic, without obvious abnormality, atraumatic Resp: breathing comfortably GI: soft, mild distended.  dressing d/c/i.  approp tender.    Lab Results:   Recent Labs  08/03/15 0434  WBC 12.1*  HGB 9.4*  HCT 28.9*  PLT 236   BMET  Recent Labs  08/03/15 0434  NA 137  K 4.1  CL 106  CO2 24  GLUCOSE 205*  BUN 18  CREATININE 1.21  CALCIUM 8.0*   PT/INR No results for input(s): LABPROT, INR in the last 72 hours. ABG No results for input(s): PHART, HCO3 in the last 72 hours.  Invalid input(s): PCO2, PO2  Studies/Results: No results found.  Anti-infectives: Anti-infectives    Start     Dose/Rate Route Frequency Ordered Stop   08/02/15 2200  cefoTEtan (CEFOTAN) 2 g in dextrose 5 % 50 mL IVPB     2 g 100 mL/hr over 30 Minutes Intravenous Every 12 hours 08/02/15 1713 08/02/15 2134   08/02/15 0739  cefoTEtan in Dextrose 5% (CEFOTAN) IVPB 2 g     2 g Intravenous 30 min pre-op 08/02/15 0739 08/02/15 1109   08/02/15 0727  cefoTEtan (CEFOTAN) 2 g in dextrose 5 % 50 mL IVPB  Status:  Discontinued     2 g 100 mL/hr over 30 Minutes Intravenous On call to O.R. 08/02/15 [id number] 08/02/15 0739   08/02/15 0727  neomycin (MYCIFRADIN) tablet 1,000 mg  Status:   Discontinued     1,000 mg Oral 3 times per day 08/02/15 0727 08/02/15 0746   08/02/15 0727  metroNIDAZOLE (FLAGYL) tablet 1,000 mg  Status:  Discontinued     1,000 mg Oral 3 times per day 08/02/15 0727 08/02/15 0746      Assessment/Plan: s/p Procedure(s): LAPAROSCOPIC SUBTOTAL COLECTOMY  AND DISTAL GASTRECTOMY  (N/A) NGT/NPO  Hopefully will get NGT out tomorrow and start sips.   Await return of bowel function.   Foley out tomorrow.   Continue pca. Add 24 hours of iv tylenol   LOS: 1 day    Steven Drake 08/03/2015

## 2015-08-04 LAB — CBC
HCT: 27.4 % — ABNORMAL LOW (ref 39.0–52.0)
HEMOGLOBIN: 8.8 g/dL — AB (ref 13.0–17.0)
MCH: 27.5 pg (ref 26.0–34.0)
MCHC: 32.1 g/dL (ref 30.0–36.0)
MCV: 85.6 fL (ref 78.0–100.0)
PLATELETS: 210 10*3/uL (ref 150–400)
RBC: 3.2 MIL/uL — AB (ref 4.22–5.81)
RDW: 17.8 % — ABNORMAL HIGH (ref 11.5–15.5)
WBC: 13.4 10*3/uL — AB (ref 4.0–10.5)

## 2015-08-04 LAB — BASIC METABOLIC PANEL
ANION GAP: 5 (ref 5–15)
BUN: 16 mg/dL (ref 6–20)
CO2: 27 mmol/L (ref 22–32)
Calcium: 8.1 mg/dL — ABNORMAL LOW (ref 8.9–10.3)
Chloride: 105 mmol/L (ref 101–111)
Creatinine, Ser: 1.19 mg/dL (ref 0.61–1.24)
GFR, EST NON AFRICAN AMERICAN: 59 mL/min — AB (ref >60)
Glucose, Bld: 137 mg/dL — ABNORMAL HIGH (ref 65–99)
POTASSIUM: 4.3 mmol/L (ref 3.5–5.1)
SODIUM: 137 mmol/L (ref 135–145)

## 2015-08-04 MED ORDER — GUAIFENESIN ER 600 MG PO TB12
600.0000 mg | ORAL_TABLET | Freq: Two times a day (BID) | ORAL | Status: DC
  Administered 2015-08-04 – 2015-08-09 (×11): 600 mg via ORAL
  Filled 2015-08-04 (×11): qty 1

## 2015-08-04 NOTE — Progress Notes (Signed)
[name], male   DOB: 1945/04/20, 70 y.o.   MRN: [id number] 2 Days Post-Op  Subjective: Having a lot of mucous.  Coughing.    Objective: Vital signs in last 24 hours: Temp:  [98.2 F (36.8 C)-99.8 F (37.7 C)] 98.2 F (36.8 C) (06/07 0609) Pulse Rate:  [49-75] 67 (06/07 0609) Resp:  [8-36] 12 (06/07 0609) BP: (131-170)/(49-65) 134/64 mmHg (06/07 0609) SpO2:  [93 %-100 %] 96 % (06/07 0609) FiO2 (%):  [45 %] 45 % (06/06 1920) Last BM Date:  (PTA)  Intake/Output from previous day: 06/06 0701 - 06/07 0700 In: 2426.7 [I.V.:2416.7; NG/GT:10] Out: 1750 [Urine:1700; Emesis/NG output:50] Intake/Output this shift:    General appearance: alert, cooperative and no distress Head: Normocephalic, without obvious abnormality, atraumatic Resp: breathing comfortably GI: soft, mild distended.  dressing d/c/i.  approp tender.    Lab Results:   Recent Labs  08/03/15 0434 08/04/15 0439  WBC 12.1* 13.4*  HGB 9.4* 8.8*  HCT 28.9* 27.4*  PLT 236 210   BMET  Recent Labs  08/03/15 0434 08/04/15 0439  NA 137 137  K 4.1 4.3  CL 106 105  CO2 24 27  GLUCOSE 205* 137*  BUN 18 16  CREATININE 1.21 1.19  CALCIUM 8.0* 8.1*   PT/INR No results for input(s): LABPROT, INR in the last 72 hours. ABG No results for input(s): PHART, HCO3 in the last 72 hours.  Invalid input(s): PCO2, PO2  Studies/Results: No results found.  Anti-infectives: Anti-infectives    Start     Dose/Rate Route Frequency Ordered Stop   08/02/15 2200  cefoTEtan (CEFOTAN) 2 g in dextrose 5 % 50 mL IVPB     2 g 100 mL/hr over 30 Minutes Intravenous Every 12 hours 08/02/15 1713 08/02/15 2134   08/02/15 0739  cefoTEtan in Dextrose 5% (CEFOTAN) IVPB 2 g     2 g Intravenous 30 min pre-op 08/02/15 0739 08/02/15 1109   08/02/15 0727  cefoTEtan (CEFOTAN) 2 g in dextrose 5 % 50 mL IVPB  Status:  Discontinued     2 g 100 mL/hr over 30 Minutes Intravenous On call to O.R. 08/02/15 0727 08/02/15 0739   08/02/15 0727  neomycin (MYCIFRADIN) tablet 1,000 mg  Status:  Discontinued     1,000 mg Oral 3 times per day 08/02/15 0727 08/02/15 0746   08/02/15 0727  metroNIDAZOLE (FLAGYL) tablet 1,000 mg  Status:  Discontinued     1,000 mg Oral 3 times per day 08/02/15 0727 08/02/15 0746      Assessment/Plan: s/p Procedure(s): LAPAROSCOPIC SUBTOTAL COLECTOMY  AND DISTAL GASTRECTOMY  (N/A) D/c ngt today Ice chips.   Await return of bowel function.   Foley out this AM.  Continue pca. 24 hours of iv tylenol   LOS: 2 days    Steven Drake 08/04/2015

## 2015-08-05 ENCOUNTER — Ambulatory Visit: Payer: Commercial Managed Care - HMO | Admitting: Family Medicine

## 2015-08-05 LAB — CBC
HCT: 27.3 % — ABNORMAL LOW (ref 39.0–52.0)
Hemoglobin: 8.8 g/dL — ABNORMAL LOW (ref 13.0–17.0)
MCH: 27.5 pg (ref 26.0–34.0)
MCHC: 32.2 g/dL (ref 30.0–36.0)
MCV: 85.3 fL (ref 78.0–100.0)
PLATELETS: 214 10*3/uL (ref 150–400)
RBC: 3.2 MIL/uL — AB (ref 4.22–5.81)
RDW: 17.7 % — AB (ref 11.5–15.5)
WBC: 10.3 10*3/uL (ref 4.0–10.5)

## 2015-08-05 LAB — BASIC METABOLIC PANEL
ANION GAP: 4 — AB (ref 5–15)
BUN: 11 mg/dL (ref 6–20)
CALCIUM: 8.4 mg/dL — AB (ref 8.9–10.3)
CO2: 30 mmol/L (ref 22–32)
Chloride: 103 mmol/L (ref 101–111)
Creatinine, Ser: 1.02 mg/dL (ref 0.61–1.24)
GFR calc Af Amer: >60 mL/min (ref >60)
GLUCOSE: 132 mg/dL — AB (ref 65–99)
POTASSIUM: 3.9 mmol/L (ref 3.5–5.1)
SODIUM: 137 mmol/L (ref 135–145)

## 2015-08-05 MED ORDER — ENOXAPARIN SODIUM 40 MG/0.4ML ~~LOC~~ SOLN
40.0000 mg | SUBCUTANEOUS | Status: DC
  Administered 2015-08-05 – 2015-08-09 (×5): 40 mg via SUBCUTANEOUS
  Filled 2015-08-05 (×4): qty 0.4

## 2015-08-05 NOTE — Progress Notes (Signed)
[name], male   DOB: 04-23-1945, 70 y.o.   MRN: [id number] 3 Days Post-Op  Subjective: Much less belching.  No flatus.  No n/v since NGT removed.     Objective: Vital signs in last 24 hours: Temp:  [98.1 F (36.7 C)-98.9 F (37.2 C)] 98.6 F (37 C) (06/08 0520) Pulse Rate:  [65-77] 72 (06/08 0520) Resp:  [10-20] 12 (06/08 0520) BP: (142-160)/(63-77) 149/63 mmHg (06/08 0520) SpO2:  [95 %-100 %] 100 % (06/08 0520) FiO2 (%):  [28 %-36 %] 36 % (06/08 0432) Last BM Date: 08/02/15  Intake/Output from previous day: 06/07 0701 - 06/08 0700 In: 2633.3 [I.V.:2383.3; IV Piggyback:250] Out: 2475 [Urine:2475] Intake/Output this shift:    General appearance: alert, cooperative and no distress Head: Normocephalic, without obvious abnormality, atraumatic Resp: breathing comfortably GI: soft, non distended today.  dressing d/c/i.  approp tender.  OnQ empty.  Lab Results:   Recent Labs  08/04/15 0439 08/05/15 0359  WBC 13.4* 10.3  HGB 8.8* 8.8*  HCT 27.4* 27.3*  PLT 210 214   BMET  Recent Labs  08/04/15 0439 08/05/15 0359  NA 137 137  K 4.3 3.9  CL 105 103  CO2 27 30  GLUCOSE 137* 132*  BUN 16 11  CREATININE 1.19 1.02  CALCIUM 8.1* 8.4*   PT/INR No results for input(s): LABPROT, INR in the last 72 hours. ABG No results for input(s): PHART, HCO3 in the last 72 hours.  Invalid input(s): PCO2, PO2  Studies/Results: No results found.  Anti-infectives: Anti-infectives    Start     Dose/Rate Route Frequency Ordered Stop   08/02/15 2200  cefoTEtan (CEFOTAN) 2 g in dextrose 5 % 50 mL IVPB     2 g 100 mL/hr over 30 Minutes Intravenous Every 12 hours 08/02/15 1713 08/02/15 2134   08/02/15 0739  cefoTEtan in Dextrose 5% (CEFOTAN) IVPB 2 g     2 g Intravenous 30 min pre-op 08/02/15 0739 08/02/15 1109   08/02/15 0727  cefoTEtan (CEFOTAN) 2 g in dextrose 5 % 50 mL IVPB  Status:  Discontinued     2 g 100 mL/hr over 30 Minutes Intravenous On call to O.R.  08/02/15 [id number] 08/02/15 0739   08/02/15 0727  neomycin (MYCIFRADIN) tablet 1,000 mg  Status:  Discontinued     1,000 mg Oral 3 times per day 08/02/15 0727 08/02/15 0746   08/02/15 0727  metroNIDAZOLE (FLAGYL) tablet 1,000 mg  Status:  Discontinued     1,000 mg Oral 3 times per day 08/02/15 0727 08/02/15 0746      Assessment/Plan: s/p Procedure(s): LAPAROSCOPIC SUBTOTAL COLECTOMY  AND DISTAL GASTRECTOMY  (N/A) Bariatric clear liq Await return of bowel function.     Continue pca. Start lovenox today   LOS: 3 days    Steven Drake 08/05/2015

## 2015-08-05 NOTE — Care Management Important Message (Signed)
Important Message  Patient Details  Name: Steven Drake MRN: [id number] Date of Birth: [birth year]   Medicare Important Message Given:  Yes    Camillo Flaming 08/05/2015, 1:08 PMImportant Message  Patient Details  Name: Steven Drake MRN: [id number] Date of Birth: [birth year]   Medicare Important Message Given:  Yes    Camillo Flaming 08/05/2015, 1:08 PM

## 2015-08-06 LAB — CBC
HEMATOCRIT: 28.5 % — AB (ref 39.0–52.0)
HEMOGLOBIN: 9.3 g/dL — AB (ref 13.0–17.0)
MCH: 27.9 pg (ref 26.0–34.0)
MCHC: 32.6 g/dL (ref 30.0–36.0)
MCV: 85.6 fL (ref 78.0–100.0)
PLATELETS: 235 10*3/uL (ref 150–400)
RBC: 3.33 MIL/uL — AB (ref 4.22–5.81)
RDW: 17.9 % — AB (ref 11.5–15.5)
WBC: 8.6 10*3/uL (ref 4.0–10.5)

## 2015-08-06 LAB — BASIC METABOLIC PANEL
Anion gap: 7 (ref 5–15)
BUN: 11 mg/dL (ref 6–20)
CALCIUM: 8.8 mg/dL — AB (ref 8.9–10.3)
CO2: 28 mmol/L (ref 22–32)
Chloride: 103 mmol/L (ref 101–111)
Creatinine, Ser: 0.98 mg/dL (ref 0.61–1.24)
GFR calc Af Amer: >60 mL/min (ref >60)
GLUCOSE: 127 mg/dL — AB (ref 65–99)
Potassium: 3.9 mmol/L (ref 3.5–5.1)
Sodium: 138 mmol/L (ref 135–145)

## 2015-08-06 LAB — TYPE AND SCREEN
ABO/RH(D): O NEG
ANTIBODY SCREEN: NEGATIVE

## 2015-08-06 MED ORDER — OXYCODONE HCL 5 MG PO TABS
5.0000 mg | ORAL_TABLET | ORAL | Status: DC | PRN
  Administered 2015-08-06 – 2015-08-07 (×4): 10 mg via ORAL
  Filled 2015-08-06 (×4): qty 2

## 2015-08-06 NOTE — Progress Notes (Signed)
Patient ID: Y[facility]INE [facility], male   DOB: [birth year], 70 y.o.   MRN: [id number] 4 Days Post-Op  Subjective: 2 stools in last 24 hours.  No n/v.  Tolerated bariatric clears.    Objective: Vital signs in last 24 hours: Temp:  [98 F (36.7 C)-98.8 F (37.1 C)] 98.3 F (36.8 C) (06/09 0959) Pulse Rate:  [61-71] 70 (06/09 0959) Resp:  [11-21] 19 (06/09 0959) BP: (104-148)/(60-83) 104/68 mmHg (06/09 0959) SpO2:  [96 %-100 %] 100 % (06/09 1157) Last BM Date: 08/05/15  Intake/Output from previous day: 06/08 0701 - 06/09 0700 In: 1820.7 [P.O.:240; I.V.:1580.7] Out: 3925 [Urine:3925] Intake/Output this shift: Total I/O In: -  Out: 675 [Urine:675]  General appearance: alert, cooperative and no distress Head: Normocephalic, without obvious abnormality, atraumatic Resp: breathing comfortably GI: soft, non distended today.  dressing d/c/i.  approp tender.  OnQ empty.  Lab Results:   Recent Labs  08/05/15 0359 08/06/15 0400  WBC 10.3 8.6  HGB 8.8* 9.3*  HCT 27.3* 28.5*  PLT 214 235   BMET  Recent Labs  08/05/15 0359 08/06/15 0400  NA 137 138  K 3.9 3.9  CL 103 103  CO2 30 28  GLUCOSE 132* 127*  BUN 11 11  CREATININE 1.02 0.98  CALCIUM 8.4* 8.8*   PT/INR No results for input(s): LABPROT, INR in the last 72 hours. ABG No results for input(s): PHART, HCO3 in the last 72 hours.  Invalid input(s): PCO2, PO2  Studies/Results: No results found.  Anti-infectives: Anti-infectives    Start     Dose/Rate Route Frequency Ordered Stop   08/02/15 2200  cefoTEtan (CEFOTAN) 2 g in dextrose 5 % 50 mL IVPB     2 g 100 mL/hr over 30 Minutes Intravenous Every 12 hours 08/02/15 1713 08/02/15 2134   08/02/15 0739  cefoTEtan in Dextrose 5% (CEFOTAN) IVPB 2 g     2 g Intravenous 30 min pre-op 08/02/15 0739 08/02/15 1109   08/02/15 0727  cefoTEtan (CEFOTAN) 2 g in dextrose 5 % 50 mL IVPB  Status:  Discontinued     2 g 100 mL/hr over 30 Minutes Intravenous On call to O.R. 08/02/15 0727  08/02/15 0739   08/02/15 0727  neomycin (MYCIFRADIN) tablet 1,000 mg  Status:  Discontinued     1,000 mg Oral 3 times per day 08/02/15 0727 08/02/15 0746   08/02/15 0727  metroNIDAZOLE (FLAGYL) tablet 1,000 mg  Status:  Discontinued     1,000 mg Oral 3 times per day 08/02/15 0727 08/02/15 0746      Assessment/Plan: s/p Procedure(s): LAPAROSCOPIC SUBTOTAL COLECTOMY  AND DISTAL GASTRECTOMY  (N/A) D/c pca Full liquids. Is at risk for diarrhea given subtotal colectomy Reg diet in AM if tolerates without nausea. Oral pain meds.    LOS: 4 days    Ghadeer Kastelic 08/06/2015

## 2015-08-07 LAB — BASIC METABOLIC PANEL
Anion gap: 8 (ref 5–15)
BUN: 13 mg/dL (ref 6–20)
CO2: 26 mmol/L (ref 22–32)
CREATININE: 1.08 mg/dL (ref 0.61–1.24)
Calcium: 8.7 mg/dL — ABNORMAL LOW (ref 8.9–10.3)
Chloride: 103 mmol/L (ref 101–111)
Glucose, Bld: 138 mg/dL — ABNORMAL HIGH (ref 65–99)
POTASSIUM: 4.1 mmol/L (ref 3.5–5.1)
SODIUM: 137 mmol/L (ref 135–145)

## 2015-08-07 LAB — CBC
HCT: 27.6 % — ABNORMAL LOW (ref 39.0–52.0)
Hemoglobin: 9.1 g/dL — ABNORMAL LOW (ref 13.0–17.0)
MCH: 27.9 pg (ref 26.0–34.0)
MCHC: 33 g/dL (ref 30.0–36.0)
MCV: 84.7 fL (ref 78.0–100.0)
PLATELETS: 244 10*3/uL (ref 150–400)
RBC: 3.26 MIL/uL — AB (ref 4.22–5.81)
RDW: 18 % — ABNORMAL HIGH (ref 11.5–15.5)
WBC: 8.7 10*3/uL (ref 4.0–10.5)

## 2015-08-07 NOTE — Progress Notes (Signed)
[facility] Office:  [phone] General Surgery Progress Note   LOS: 5 days  POD -  5 Days Post-Op  Assessment/Plan: 1.  LAPAROSCOPIC SUBTOTAL COLECTOMY  AND DISTAL GASTRECTOMY- 08/02/2015 - [place]  Right transverse colon ca - 4.3 cm, 2/14 nodes (T3, N1b)  Rectosigmoid colon ca - 2.6 cm,T3, 0/10 nodes (T3, N0)  Stomach - invasive adenoca,  3.0 cm, LVI, 8/16 nodes involved (T2, N3a)  Seems like he is doing okay - to advance to reg diet  2. DVT prophylaxis - Lovenox 3.  Anemia - Hgb - 9.1 - 08/07/2015 4.  History of smoking 5.  Numerous upper lung nodules seen on CT [facility]   Active Problems:   Colon cancer (HCC)  Subjective:  Coughing.  Having small BM's. Uncomfortable.   Woman in room, but not wife.  Objective:   Filed Vitals:   08/06/15 2222 08/07/15 0600  BP: 132/64 117/50  Pulse: 71 63  Temp: 98.2 F (36.8 C) 98.6 F (37 C)  Resp: 16 14     Intake/Output from previous day:  06/09 0701 - 06/10 0700 In: 1680 [P.O.:240; I.V.:1440] Out: 925 [Urine:925]  Intake/Output this shift:  Total I/O In: 379 [P.O.:120; I.V.:209; IV Piggyback:50] Out: -    Physical Exam:   General: WN older WM, bearded, who is alert and oriented.    HEENT: Normal. Pupils equal. .   Lungs: Coughs.  Pulls 1,400 cc on IS.   Abdomen: Soft.  Has BS.   Wound: Clean. OnQ pump tubing removed.   Lab Results:    Recent Labs  08/06/15 0400 08/07/15 0343  WBC 8.6 8.7  HGB 9.3* 9.1*  HCT 28.5* 27.6*  PLT 235 244    BMET   Recent Labs  08/06/15 0400 08/07/15 0343  NA 138 137  K 3.9 4.1  CL 103 103  CO2 28 26  GLUCOSE 127* 138*  BUN 11 13  CREATININE 0.98 1.08  CALCIUM 8.8* 8.7*    PT/INR  No results for input(s): LABPROT, INR in the last 72 hours.  ABG  No results for input(s): PHART, HCO3 in the last 72 hours.  Invalid input(s): PCO2, PO2   Studies/Results:  No results found.   Anti-infectives:   Anti-infectives    Start     Dose/Rate Route  Frequency Ordered Stop   08/02/15 2200  cefoTEtan (CEFOTAN) 2 g in dextrose 5 % 50 mL IVPB     2 g 100 mL/hr over 30 Minutes Intravenous Every 12 hours 08/02/15 1713 08/02/15 2134   08/02/15 0739  cefoTEtan in Dextrose 5% (CEFOTAN) IVPB 2 g     2 g Intravenous 30 min pre-op 08/02/15 0739 08/02/15 1109   08/02/15 0727  cefoTEtan (CEFOTAN) 2 g in dextrose 5 % 50 mL IVPB  Status:  Discontinued     2 g 100 mL/hr over 30 Minutes Intravenous On call to O.R. 08/02/15 0727 08/02/15 0739   08/02/15 0727  neomycin (MYCIFRADIN) tablet 1,000 mg  Status:  Discontinued     1,000 mg Oral 3 times per day 08/02/15 0727 08/02/15 0746   08/02/15 0727  metroNIDAZOLE (FLAGYL) tablet 1,000 mg  Status:  Discontinued     1,000 mg Oral 3 times per day 08/02/15 0727 08/02/15 0746      Alphonsa Overall, MD, FACS Pager: [facility] Office: [phone] 08/07/2015

## 2015-08-08 NOTE — Progress Notes (Signed)
[name] Office:  [phone] General Surgery Progress Note   LOS: 6 days  POD -  6 Days Post-Op  Assessment/Plan: 1.  LAPAROSCOPIC SUBTOTAL COLECTOMY  AND DISTAL GASTRECTOMY- 08/02/2015 - [place]  Right transverse colon ca - 4.3 cm, 2/14 nodes (T3, N1b)  Rectosigmoid colon ca - 2.6 cm,T3, 0/10 nodes (T3, N0)  Stomach - invasive adenoca,  3.0 cm, LVI, 8/16 nodes involved (T2, N3a)  Some mild nausea with reg diet - question liquid intake.  Will keep till tomorrow.  2.  DVT prophylaxis - Lovenox 3.  Anemia - Hgb - 9.1 - 08/07/2015 4.  History of smoking 5.  Numerous upper lung nodules seen on CT scan at [facility]   Active Problems:   Colon cancer [facility])  Subjective:  Cough better.  Has had BM's.  PO intake fair, not sure how well he is doing with liquids.  Dian Situ, in room with patient.  Objective:   Filed Vitals:   08/07/15 2109 08/08/15 0515  BP: 133/71 141/66  Pulse: 71 66  Temp: 98.6 F (37 C) 98.9 F (37.2 C)  Resp: 16 16     Intake/Output from previous day:  06/10 0701 - 06/11 0700 In: 2030 [P.O.:540; I.V.:1440; IV Piggyback:50] Out: 775 [Urine:775]  Intake/Output this shift:      Physical Exam:   General: WN older WM, bearded, who is alert and oriented.    HEENT: Normal. Pupils equal. .   Lungs: Cough better today.   Abdomen: Soft.  Has BS.   Wound: Clean.    Lab Results:     Recent Labs  08/06/15 0400 08/07/15 0343  WBC 8.6 8.7  HGB 9.3* 9.1*  HCT 28.5* 27.6*  PLT 235 244    BMET    Recent Labs  08/06/15 0400 08/07/15 0343  NA 138 137  K 3.9 4.1  CL 103 103  CO2 28 26  GLUCOSE 127* 138*  BUN 11 13  CREATININE 0.98 1.08  CALCIUM 8.8* 8.7*    PT/INR  No results for input(s): LABPROT, INR in the last 72 hours.  ABG  No results for input(s): PHART, HCO3 in the last 72 hours.  Invalid input(s): PCO2, PO2   Studies/Results:  No results found.   Anti-infectives:   Anti-infectives    Start     Dose/Rate  Route Frequency Ordered Stop   08/02/15 2200  cefoTEtan (CEFOTAN) 2 g in dextrose 5 % 50 mL IVPB     2 g 100 mL/hr over 30 Minutes Intravenous Every 12 hours 08/02/15 1713 08/02/15 2134   08/02/15 0739  cefoTEtan in Dextrose 5% (CEFOTAN) IVPB 2 g     2 g Intravenous 30 min pre-op 08/02/15 0739 08/02/15 1109   08/02/15 0727  cefoTEtan (CEFOTAN) 2 g in dextrose 5 % 50 mL IVPB  Status:  Discontinued     2 g 100 mL/hr over 30 Minutes Intravenous On call to O.R. 08/02/15 0727 08/02/15 0739   08/02/15 0727  neomycin (MYCIFRADIN) tablet 1,000 mg  Status:  Discontinued     1,000 mg Oral 3 times per day 08/02/15 0727 08/02/15 0746   08/02/15 0727  metroNIDAZOLE (FLAGYL) tablet 1,000 mg  Status:  Discontinued     1,000 mg Oral 3 times per day 08/02/15 0727 08/02/15 0746      Alphonsa Overall, MD, FACS Pager: [facility] Office: [phone] 08/08/2015

## 2015-08-09 MED ORDER — CARISOPRODOL 350 MG PO TABS: 350.0000 mg | ORAL_TABLET | Freq: Three times a day (TID) | ORAL | Status: DC | PRN

## 2015-08-09 MED ORDER — LOPERAMIDE HCL 2 MG PO TABS: 2.0000 mg | ORAL_TABLET | Freq: Four times a day (QID) | ORAL | Status: DC | PRN

## 2015-08-09 MED ORDER — OXYCODONE HCL 5 MG PO TABS: 5.0000 mg | ORAL_TABLET | ORAL | Status: DC | PRN

## 2015-08-09 NOTE — Progress Notes (Signed)
Staples removed and steri strips applied per MD order. Bethann Punches RN

## 2015-08-09 NOTE — Progress Notes (Signed)
Date:  June12, 2017 Chart reviewed for concurrent status and case management needs. Will continue to follow the patient for changes and needs:  dcd with no cm needs Expected discharge date: [name] [name], [name], [name], [name]

## 2015-08-09 NOTE — Care Management Important Message (Signed)
Important Message  Patient Details  Name: HURLEY BAZE MRN: [id number] Date of Birth: May 13, 1945   Medicare Important Message Given:  Yes    Camillo Flaming 08/09/2015, 10:36 AMImportant Message  Patient Details  Name: LUCIANO SHANNAHAN MRN: [id number] Date of Birth: [birth year]   Medicare Important Message Given:  Yes    Camillo Flaming 08/09/2015, 10:35 AM

## 2015-08-09 NOTE — Discharge Summary (Signed)
Physician Discharge Summary  [name] MRN: [id number] DOB/AGE: 1945/09/07 70 y.o.  Admit date: 08/02/2015 Discharge date: 08/09/2015  Admission Diagnoses: Patient Active Problem List   Diagnosis Date Noted  . Colon cancer ([place]) 08/02/2015  . Colon cancer, ascending ([place]) 07/01/2015  . Gastric adenocarcinoma ([place]) 06/29/2015  . History of colonic polyps   . Mucosal abnormality of stomach   . Dysphagia   . Hiatal hernia   . Schatzki's ring   . Heme + stool 05/20/2015  . Anemia 05/20/2015  . Enlarged prostate on rectal examination 05/04/2015  . Hip discomfort 03/05/2015  . CAD in native artery 08/27/2014  . Hypokalemia-replaced 01/20/2013  . Tobacco abuse 01/20/2013  . Metabolic syndrome, with mildly elevated HgBA1C 01/20/2013  . Non-STEMI (non-ST elevated myocardial infarction), involving RCA/ LAD-diag  01/19/2013  . CAD (coronary artery disease) RCA non dominant vessel, LAD 50-60%, 80-90% 2nd diag EF 55%  01/19/2013  . Hyperlipidemia with target LDL less than 70 01/19/2013  . Pulmonary nodules 01/19/2013    Discharge Diagnoses:  Same  Discharged Condition: stable  Hospital Course:  Pt was admitted from the recovery room to the floor following a laparoscopic subtotal colectomy with ileorectal anastamosis and open distal gastrectomy with billroth 2 reconstruction on 08/02/2015.  He had an anticipated post op ileus.  He had a fair amount of belching and hiccupping.  Pain was controlled with a PCA.  His NGT was removed on POD 2.  His diet was slowly advanced.  He was transitioned to oral pain medication.  He was ambulatory and was able to void with foley removed.    Consults: None  Significant Diagnostic Studies: labs: HCT 27.6 prior to d/c.  , Cr stable.    Treatments: surgery: see above  Discharge Exam: Blood pressure 151/67, pulse 65, temperature 98.5 F (36.9 C), temperature source Oral, resp. rate 16, height 5\' 9"  (1.753 m), weight 78.019 kg (172 lb), SpO2  100 %. General appearance: alert, cooperative and no distress Resp: breathing comfortably Cardio: regular rate and rhythm GI: soft, approp tender.  no erythema or drainage.    Disposition: 01-Home or Self Care  Discharge Instructions    Call MD for:  persistant nausea and vomiting    Complete by:  As directed      Call MD for:  redness, tenderness, or signs of infection (pain, swelling, redness, odor or green/yellow discharge around incision site)    Complete by:  As directed      Call MD for:  severe uncontrolled pain    Complete by:  As directed      Call MD for:  temperature >100.4    Complete by:  As directed      Diet - low sodium heart healthy    Complete by:  As directed      Increase activity slowly    Complete by:  As directed             Medication List    STOP taking these medications        metroNIDAZOLE 500 MG tablet  Commonly known as:  FLAGYL     neomycin 500 MG tablet  Commonly known as:  MYCIFRADIN     predniSONE 20 MG tablet  Commonly known as:  DELTASONE      TAKE these medications        atorvastatin 40 MG tablet  Commonly known as:  LIPITOR  TAKE 1 TABLET BY MOUTH EVERY DAY  carisoprodol 350 MG tablet  Commonly known as:  SOMA  Take 1 tablet (350 mg total) by mouth 3 (three) times daily as needed for muscle spasms.     doxycycline 100 MG tablet  Commonly known as:  ADOXA  Take 1 tablet (100 mg total) by mouth 2 (two) times daily.     fluticasone 50 MCG/ACT nasal spray  Commonly known as:  FLONASE  Place 1 spray into both nostrils 2 (two) times daily as needed for allergies or rhinitis.     lisinopril 5 MG tablet  Commonly known as:  PRINIVIL,ZESTRIL  Take 1 tablet (5 mg total) by mouth daily.     loperamide 2 MG tablet  Commonly known as:  IMODIUM A-D  Take 1 tablet (2 mg total) by mouth 4 (four) times daily as needed for diarrhea or loose stools.     oxyCODONE 5 MG immediate release tablet  Commonly known as:  Oxy  IR/ROXICODONE  Take 1-2 tablets (5-10 mg total) by mouth every 4 (four) hours as needed for moderate pain, severe pain or breakthrough pain.     pantoprazole 40 MG tablet  Commonly known as:  PROTONIX  TAKE 1 TABLET BY MOUTH DAILY AT 6:00 A.M.     VENTOLIN HFA 108 (90 Base) MCG/ACT inhaler  Generic drug:  albuterol  INHALE 2 PUFFS BY MOUTH EVERY SIX HOURS AS NEEDED FOR WHEEZING OR SHORTNESS OF BREATH.           Follow-up Information    Follow up with [facility], [place].   Specialty:  General Surgery   Contact information:   [address] [place] [address] [phone]       Signed: Stark Klein 08/09/2015, 8:36 AM

## 2015-08-09 NOTE — Discharge Instructions (Signed)
[facility]      [facility] °[phone] ° °ABDOMINAL SURGERY: POST OP INSTRUCTIONS ° °Always review your discharge instruction sheet given to you by the facility where your surgery was performed. ° °IF YOU HAVE DISABILITY OR FAMILY LEAVE FORMS, YOU MUST BRING THEM TO THE OFFICE FOR PROCESSING.  PLEASE DO NOT GIVE THEM TO YOUR DOCTOR. ° °1. A prescription for pain medication may be given to you upon discharge.  Take your pain medication as prescribed, if needed.  If narcotic pain medicine is not needed, then you may take acetaminophen (Tylenol) or ibuprofen (Advil) as needed. °2. Take your usually prescribed medications unless otherwise directed. °3. If you need a refill on your pain medication, please contact your pharmacy. They will contact our office to request authorization.  Prescriptions will not be filled after 5pm or on week-ends. °4. You should follow a light diet the first few days after arrival home, such as soup and crackers, pudding, etc.unless your doctor has advised otherwise. A high-fiber, low fat diet can be resumed as tolerated.   Be sure to include lots of fluids daily. Most patients will experience some swelling and bruising on the chest and neck area.  Ice packs will help.  Swelling and bruising can take several days to resolve °5. Most patients will experience some swelling and bruising in the area of the incision. Ice pack will help. Swelling and bruising can take several days to resolve..  °6. It is common to experience some constipation if taking pain medication after surgery.  Increasing fluid intake and taking a stool softener will usually help or prevent this problem from occurring.  A mild laxative (Milk of Magnesia or Miralax) should be taken according to package directions if there are no bowel movements after 48 hours. °7.  You may have steri-strips (small skin tapes) in place directly over the incision.  These strips should be left on the skin for 10-14 days.  If your  surgeon used skin glue on the incision, you may shower in 48 hours.  The glue will flake off over the next 2-3 weeks.  Any sutures or staples will be removed at the office during your follow-up visit. You may find that a light gauze bandage over your incision may keep your staples from being rubbed or pulled. You may shower and replace the bandage daily. °8. ACTIVITIES:  You may resume regular (light) daily activities beginning the next day--such as daily self-care, walking, climbing stairs--gradually increasing activities as tolerated.  You may have sexual intercourse when it is comfortable.  Refrain from any heavy lifting or straining until approved by your doctor. °a. You may drive when you no longer are taking prescription pain medication, you can comfortably wear a seatbelt, and you can safely maneuver your car and apply brakes °b. Return to Work: __________8 weeks if applicable_________________________ °9. You should see your doctor in the office for a follow-up appointment approximately two weeks after your surgery.  Make sure that you call for this appointment within a day or two after you arrive home to insure a convenient appointment time. °OTHER INSTRUCTIONS:  °_____________________________________________________________ °_____________________________________________________________ ° °WHEN TO CALL YOUR DOCTOR: °1. Fever over 101.0 °2. Inability to urinate °3. Nausea and/or vomiting °4. Extreme swelling or bruising °5. Continued bleeding from incision. °6. Increased pain, redness, or drainage from the incision. °7. Difficulty swallowing or breathing °8. Muscle cramping or spasms. °9. Numbness or tingling in hands or feet or around lips. ° °The clinic staff is   available to answer your questions during regular business hours.  Please don’t hesitate to call and ask to speak to one of the nurses if you have concerns. ° °For further questions, please visit [email or web address] ° ° ° °

## 2015-08-12 ENCOUNTER — Encounter (HOSPITAL_COMMUNITY): Payer: Self-pay

## 2015-08-20 ENCOUNTER — Encounter (HOSPITAL_COMMUNITY): Payer: Self-pay | Admitting: Oncology

## 2015-08-20 ENCOUNTER — Encounter (HOSPITAL_COMMUNITY): Payer: Commercial Managed Care - HMO | Attending: Hematology & Oncology | Admitting: Oncology

## 2015-08-20 ENCOUNTER — Other Ambulatory Visit (HOSPITAL_COMMUNITY): Payer: Self-pay | Admitting: Oncology

## 2015-08-20 VITALS — BP 106/59 | HR 64 | Temp 97.9°F | Resp 16 | Wt 153.8 lb

## 2015-08-20 DIAGNOSIS — C169 Malignant neoplasm of stomach, unspecified: Secondary | ICD-10-CM | POA: Diagnosis not present

## 2015-08-20 DIAGNOSIS — C19 Malignant neoplasm of rectosigmoid junction: Secondary | ICD-10-CM | POA: Diagnosis not present

## 2015-08-20 DIAGNOSIS — R918 Other nonspecific abnormal finding of lung field: Secondary | ICD-10-CM | POA: Insufficient documentation

## 2015-08-20 DIAGNOSIS — C180 Malignant neoplasm of cecum: Secondary | ICD-10-CM

## 2015-08-20 DIAGNOSIS — D649 Anemia, unspecified: Secondary | ICD-10-CM | POA: Insufficient documentation

## 2015-08-20 DIAGNOSIS — C189 Malignant neoplasm of colon, unspecified: Secondary | ICD-10-CM | POA: Insufficient documentation

## 2015-08-20 NOTE — Progress Notes (Signed)
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 male returns for followup to discuss surgical findings and outcomes: 1. Adenocarcinoma of cecum [facility])   Staging form: Colon and Rectum, AJCC 7th Edition     Pathologic stage from 08/06/2015: Stage IIIB (T3, N1b, cM0) - Signed by Baird Cancer, PA-C on 08/20/2015 2. 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 Gastric adenocarcinoma ([place])   Staging form: Stomach, AJCC 7th Edition     Pathologic stage from 08/06/2015: Stage IIIA (T2, N3a, cM0) - Signed by Baird Cancer, PA-C on 08/20/2015     Gastric adenocarcinoma ([place])   06/17/2015 Procedure Stomach biopsy of pyloric lesion, Dr. 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   06/28/2015 PET scan Intensely hypermetabolic cecal mass. No definitive evidence of metastatic disease. Scattered upper lobe predominant pulmonary nodules are too small for PET resolution.    08/02/2015 Procedure Antral resection of tumor of stomach by Dr. Stark Klein   08/06/2015 Pathology Results Invasive adenocarcinoma, poorly differentiated, 3.0 cm, extending into the muscularis propria, LVI +, 8/16 positive lymph nodes for metastatic disease, negative margins.   08/06/2015 Cancer Staging pT2N3A    Adenocarcinoma of cecum ([place])   06/10/2015 Procedure Colonoscopy with Dr. Gala Romney, Cecal neoplasm. large polyp at hepatic flexure. lesion tattooed, large polyp in mid sigmoid (lesion not completely  removed)   06/10/2015 Pathology Results Cecal mass - adenocarcinoma, polyp at hepatic flexure foci of adenocarcinoma arising from tubulovillous adenoma   06/28/2015 PET scan Intensely hypermetabolic cecal mass. No definitive evidence of metastatic disease. Scattered upper lobe predominant pulmonary nodules are too small for PET resolution.    08/02/2015 Procedure Segmental resection of tumor, right, transverse, descending colon by Dr. Stark Klein   08/06/2015 Pathology Results Invasive adenocarcinoma, 4.3 cm, well-differentiated, extending through the muscularis propria into the pericolonic soft tissue. 2/14 lymph nodes positive for metastatic disease. Surgical margins are negative.   08/06/2015 Cancer Staging pT3N1B    Adenocarcinoma of rectosigmoid junction ([place])   06/10/2015 Procedure Colonoscopy with Dr. Gala Romney, Cecal neoplasm. large polyp at hepatic flexure. lesion tattooed, large polyp in mid sigmoid (lesion not completely removed)   06/10/2015 Pathology Results Polyp sigmoid - tubulovillous adenoma with at least high grade dysplasia cannot rule out focal adenocarcinoma   06/28/2015 PET scan Intensely hypermetabolic cecal mass. No definitive evidence of metastatic disease. Scattered upper lobe predominant pulmonary nodules are too small for PET resolution.    08/02/2015 Procedure Segmental resection for tumor of rectosigmoid colon by Dr. Stark Klein.   08/06/2015 Pathology Results Invasive adenocarcinoma, 2.6 cm, well-differentiated, extending through the muscularis propria and into pericolonic soft tissue, 0/10 nodes involved, negative resection margins. No LVI.   08/06/2015 Cancer Staging pT3N0   He is accompanied by his sister.  He tolerated surgery well.  He is recovering nicely without any issues.   I reviewed all the information with the patient. He was hoping to not learn that he needs chemotherapy. He's not completely against it, and it is strongly  advised given his high risk disease (particularly  gastric carcinoma). Reviewed all the information regarding chemotherapy. We talked briefly about FOLFOX. We discussed the risks, benefits, alternatives, and side effects of this intervention including, but not limited to, cold intolerance, peripheral neuropathy, fatigue, nausea, anaphylaxis, death.  Have also reviewed the role of the Port-A-Cath and we've looked at pictures on the computer's device to help explain its role in his cancer care. He is established with Dr. Barry Dienes from a surgical perspective but he does note that he is resistant to return to the operating room. Other options for Port-A-Cath placement include interventional radiology.  He remains active. He's recovered nicely from surgery. His surgical wounds are all closed without any indication for infection.  He notes that he does have follow-up with Dr. Barry Dienes in mid-July.  His biggest concern associated with chemotherapy is how he will continue to work during treatment. He is retired but continues to Engineer, materials and work on the farm.   Review of Systems  Constitutional: Negative for fever, chills and malaise/fatigue.  HENT: Negative.   Eyes: Negative.   Respiratory: Negative.   Cardiovascular: Negative.   Gastrointestinal: Negative.   Genitourinary: Negative.   Musculoskeletal: Negative.   Skin: Negative.   Neurological: Negative.  Negative for weakness.  Endo/Heme/Allergies: Negative.   Psychiatric/Behavioral: Negative.     Past Medical History  Diagnosis Date  . GERD (gastroesophageal reflux disease)   . STEMI (ST elevation myocardial infarction), 01/19/13 01/19/2013  . Hyperlipidemia LDL goal < 70 01/19/2013  . Pulmonary nodules 01/19/2013  . Hypokalemia 01/20/2013  . Tobacco abuse 01/20/2013  . Metabolic syndrome, with mildly elevated HgBA1C 01/20/2013  . Gastric adenocarcinoma ([place]) 06/29/2015  . Hypertension   . CAD (coronary artery disease) RCA non dominant vessel, LAD 40%, 80-90% 2nd diag EF 55%  01/19/2013      Dr. Claiborne Billings [place] 03-05-15 Epic.  . Gastric ulcer     many yrs ago  . Transfusion history     with gastric ulcer- many yrs ago  . Adenocarcinoma of cecum ([place]) 07/01/2015  . Adenocarcinoma of rectosigmoid junction ([place]) 08/02/2015    Past Surgical History  Procedure Laterality Date  . Appendectomy    . Left heart cath N/A 01/19/2013    Procedure: LEFT HEART CATH;  Surgeon: Troy Sine, MD;  Location: [facility] CATH LAB;  Service: Cardiovascular;  Laterality: N/A;  . Colonoscopy N/A 06/10/2015    RMR: cecal neoplasm  ? biopsied. large polyp at the hepatic flexure removed with piecmeal polypectomy and APC ablation. lesion tattooed. Large polyp in the mid sigmoid status post piecemeal hot snare debulking and tattooing. this lesion not completely removed. scatterd pancolonic diverticulsosis. no speciments collected.   . Esophagogastroduodenoscopy N/A 06/10/2015    Procedure: ESOPHAGOGASTRODUODENOSCOPY (EGD);  Surgeon: Daneil Dolin, MD;  Location: [facility] ENDO SUITE;  Service: Endoscopy;  Laterality: N/A;  . Esophagogastroduodenoscopy N/A 06/17/2015    RMR: normal esophagus small hiatal hernia. Abnormal nodular antrum .pyloric channel status post biopsy.   Marland Kitchen Hernia repair Right   . Cataract extraction, bilateral    . Laparoscopic subtotal colectomy N/A 08/02/2015    Procedure: LAPAROSCOPIC SUBTOTAL COLECTOMY  AND DISTAL GASTRECTOMY ;  Surgeon: Stark Klein, MD;  Location: [facility] ORS;  Service: General;  Laterality: N/A;    Family History  Problem Relation Age of Onset  . Cancer Father     colon  . Cancer Sister     colon    Social History   Social History  .  Marital Status: Widowed    Spouse Name: N/A  . Number of Children: N/A  . Years of Education: N/A   Occupational History  . farmer    Social History Main Topics  . Smoking status: Current Every Day Smoker -- 1.00 packs/day for 58 years    Types: Cigarettes    Start date: 01/21/1963  . Smokeless tobacco: Never Used     Comment: one pack  daily  . Alcohol Use: No     Comment: none in 15 yrs -heavy user-none now.  . Drug Use: No  . Sexual Activity: Not Asked   Other Topics Concern  . None   Social History Narrative     PHYSICAL EXAMINATION  ECOG PERFORMANCE STATUS: 1 - Symptomatic but completely ambulatory  Filed Vitals:   08/20/15 0957  BP: 106/59  Pulse: 64  Temp: 97.9 F (36.6 C)  Resp: 16    GENERAL:alert, no distress, well nourished, well developed, comfortable, cooperative, smiling and accompanied by his sister SKIN: skin color, texture, turgor are normal, no rashes or significant lesions HEAD: Normocephalic, No masses, lesions, tenderness or abnormalities EYES: normal, Conjunctiva are pink and non-injected EARS: External ears normal OROPHARYNX:lips, buccal mucosa, and tongue normal and mucous membranes are moist  NECK: supple, trachea midline LYMPH:  no palpable lymphadenopathy BREAST:not examined LUNGS: clear to auscultation , decreased breath sounds HEART: regular rate & rhythm, no murmurs and no gallops ABDOMEN:abdomen soft, non-tender and normal bowel sounds, surgical site is well healed without any erythema, pain, or discharge. BACK: Back symmetric, no curvature., No CVA tenderness EXTREMITIES:less then 2 second capillary refill, no joint deformities, effusion, or inflammation, no skin discoloration, no cyanosis  NEURO: alert & oriented x 3 with fluent speech, no focal motor/sensory deficits, gait normal   LABORATORY DATA: CBC    Component Value Date/Time   WBC 8.7 08/07/2015 0343   WBC 8.5 05/04/2015 1410   RBC 3.26* 08/07/2015 0343   RBC 3.47* 05/04/2015 1410   HGB 9.1* 08/07/2015 0343   HCT 27.6* 08/07/2015 0343   HCT 31.4* 05/04/2015 1410   PLT 244 08/07/2015 0343   PLT 245 05/04/2015 1410   MCV 84.7 08/07/2015 0343   MCV 91 05/04/2015 1410   MCH 27.9 08/07/2015 0343   MCH 28.8 05/04/2015 1410   MCHC 33.0 08/07/2015 0343   MCHC 31.8 05/04/2015 1410   RDW 18.0* 08/07/2015 0343    RDW 14.2 05/04/2015 1410   LYMPHSABS 2.0 05/04/2015 1410   EOSABS 0.5* 05/04/2015 1410   BASOSABS 0.0 05/04/2015 1410      Chemistry      Component Value Date/Time   NA 137 08/07/2015 0343   K 4.1 08/07/2015 0343   CL 103 08/07/2015 0343   CO2 26 08/07/2015 0343   BUN 13 08/07/2015 0343   CREATININE 1.08 08/07/2015 0343   CREATININE 1.32* 04/13/2015 1252      Component Value Date/Time   CALCIUM 8.7* 08/07/2015 0343   ALKPHOS 107 04/13/2015 1252   AST 14 04/13/2015 1252   ALT 8* 04/13/2015 1252   BILITOT 0.3 04/13/2015 1252      Lab Results  Component Value Date   CEA 8.0* 06/21/2015     PENDING LABS:   RADIOGRAPHIC STUDIES:  No results found.   PATHOLOGY:    ASSESSMENT AND PLAN:  Gastric adenocarcinoma ([place]) Stage IIIA (T2N3AM0) invasive adenocarcinoma of antrum of stomach with 8/16 lymph nodes involved with metastatic disease having undergone definitive resection by Dr. Barry Dienes on 08/02/2015.  Oncology  history developed.  Staging in [facility] problem list completed.  The patient's case has been discussed with Dr. Benay Spice (GI Med Onc).  The patient's biggest mortality is his gastric cancer.  We discussed treatment options and his high risk disease.  Dr. Benay Spice recommended FOLFOX chemotherapy in the adjuvant setting.  The patient and I reviewed this information.  I personally reviewed and went over radiographic studies with the patient.  The results are noted within this dictation.  I personally reviewed and went over pathology results with the patient.  He asked a number of questions regarding treatment and these were answered to his satisfaction.  He is educated on the role of chemotherapy in this setting is to reduce the risk of recurrence.  He knows that pursuing chemotherapy does not result in 100% protection from recurrence, but does significantly reduce the risk of recurrence.  He is educated on the logistics of FOLFOX treatment.  He knows that it is given  every 2 weeks with a 48 infusion pump that is discontinued on day 3.  He knows that he will be treated x 12 cycles resulting in ~ 6 months worth of treatment.  I've discussed the role of a port-a-cath and how it is used in the administration of chemotherapy.  He knows that there is a numbing cream for this to make repeated venipunctures more palatable.  We discussed the risks, benefits, alternatives, and side effects of FOLFOX chemotherapy including, but not limited to, cold intolerance, peripheral neuropathy, diarrhea, nausea, fatigue, anaphylaxis.  He knows that he will not only need a port placed prior to the start of chemotherapy, but also chemotherapy teaching.  He is established with Dr. Barry Dienes and therefore we can send him to her for port placement when he decides about treatment.  He notes hesitancy to "go back to the operating room" and therefore, if this is an issue, we can refer him to IR for port placement.  He is concerned about how chemotherapy may interfere with his life and he is advised that most patient tolerate FOLFOX well without any major limiting issues.    He is not opposed to chemotherapy and would like to consider our discussion over the weekend.  He will call us on Monday/Tuesday with his answer.  Tentatively, I have him scheduled to return in ~ 3 weeks which based upon his decision for treatment, we can manipuk   Adenocarcinoma of cecum ([place]) Stage IIIB (T3N1BM0) invasive adenocarcinoma of cecum, S/P resection by Dr. Barry Dienes on 08/02/2015.  Oncology history developed.  Staging in CH[facility] problem list completed.  I personally reviewed and went over pathology results with the patient.   His Stage IIIB disease absolutely meets criteria for adjuvant chemotherapy with FOLFOX.  HOWEVER, his simultaneous diagnosis of adenocarcinoma of stomach is the patient's biggest mortality.  As mentioned above, the patient's case was reviewed with Dr. Benay Spice (GI Med Onc).  FOLFOX is  recommended.  Adenocarcinoma of rectosigmoid junction ([facility]) Stage IIA (T3N0M0) invasive adenocarcinoma of rectosigmoid colon, S/P surgical resection by Dr. Barry Dienes on 08/02/2015.  Oncology history developed.  Staging in CH[facility] problem list is completed.  I personally reviewed and went over pathology results with the patient.     ORDERS PLACED FOR THIS ENCOUNTER: No orders of the defined types were placed in this encounter.    MEDICATIONS PRESCRIBED THIS ENCOUNTER: No orders of the defined types were placed in this encounter.    THERAPY PLAN:  Anticipate moving forward with port placement, chemotherapy teaching, and  start of FOLFOX chemotherapy in the next few weeks.  All questions were answered. The patient knows to call the clinic with any problems, questions or concerns. We can certainly see the patient much sooner if necessary.  Patient and plan discussed with Dr. Ancil Linsey and she is in agreement with the aforementioned.   This note is electronically signed by: Doy Mince 08/20/2015 5:49 PM

## 2015-08-20 NOTE — Assessment & Plan Note (Signed)
Stage IIIB (T3N1BM0) invasive adenocarcinoma of cecum, S/P resection by Dr. Barry Dienes on 08/02/2015.  Oncology history developed.  Staging in CH[facility] problem list completed.  I personally reviewed and went over pathology results with the patient.   His Stage IIIB disease absolutely meets criteria for adjuvant chemotherapy with FOLFOX.  HOWEVER, his simultaneous diagnosis of adenocarcinoma of stomach is the patient's biggest mortality.  As mentioned above, the patient's case was reviewed with Dr. Benay Spice (GI Med Onc).  FOLFOX is recommended.

## 2015-08-20 NOTE — Assessment & Plan Note (Addendum)
Stage IIIA (T2N3AM0) invasive adenocarcinoma of antrum of stomach with 8/16 lymph nodes involved with metastatic disease having undergone definitive resection by Dr. Barry Dienes on 08/02/2015.  Oncology history developed.  Staging in CH[facility] problem list completed.  The patient's case has been discussed with Dr. Benay Spice (GI Med Onc).  The patient's biggest mortality is his gastric cancer.  We discussed treatment options and his high risk disease.  Dr. Benay Spice recommended FOLFOX chemotherapy in the adjuvant setting.  The patient and I reviewed this information.  I personally reviewed and went over radiographic studies with the patient.  The results are noted within this dictation.  I personally reviewed and went over pathology results with the patient.  He asked a number of questions regarding treatment and these were answered to his satisfaction.  He is educated on the role of chemotherapy in this setting is to reduce the risk of recurrence.  He knows that pursuing chemotherapy does not result in 100% protection from recurrence, but does significantly reduce the risk of recurrence.  He is educated on the logistics of FOLFOX treatment.  He knows that it is given every 2 weeks with a 48 infusion pump that is discontinued on day 3.  He knows that he will be treated x 12 cycles resulting in ~ 6 months worth of treatment.  I've discussed the role of a port-a-cath and how it is used in the administration of chemotherapy.  He knows that there is a numbing cream for this to make repeated venipunctures more palatable.  We discussed the risks, benefits, alternatives, and side effects of FOLFOX chemotherapy including, but not limited to, cold intolerance, peripheral neuropathy, diarrhea, nausea, fatigue, anaphylaxis.  He knows that he will not only need a port placed prior to the start of chemotherapy, but also chemotherapy teaching.  He is established with Dr. Barry Dienes and therefore we can send him to her for port placement  when he decides about treatment.  He notes hesitancy to "go back to the operating room" and therefore, if this is an issue, we can refer him to IR for port placement.  He is concerned about how chemotherapy may interfere with his life and he is advised that most patient tolerate FOLFOX well without any major limiting issues.    He is not opposed to chemotherapy and would like to consider our discussion over the weekend.  He will call us on Monday/Tuesday with his answer.  Tentatively, I have him scheduled to return in ~ 3 weeks which based upon his decision for treatment, we can manipuk

## 2015-08-20 NOTE — Patient Instructions (Signed)
[place] at [facility] Discharge Instructions  RECOMMENDATIONS MADE BY THE CONSULTANT AND ANY TEST RESULTS WILL BE SENT TO YOUR REFERRING PHYSICIAN.  You were seen by Kirby Crigler, PA today.  Consider starting chemotherapy treatment called Folfox.   Call the clinic Monday or Tuesday with your decision regarding treatment.   Treatment window is to start treatment 4-6 weeks from surgery date.   You will get a Port-a-cath placed and chemo teaching if you decide to start treatment.   Call the clinic with any questions or concerns.   Tentatively return to the clinic to see Dr. Whitney Muse on 09/10/15.    Thank you for choosing [place] at [facility] to provide your oncology and hematology care.  To afford each patient quality time with our provider, please arrive at least 15 minutes before your scheduled appointment time.   Beginning January 23rd 2017 lab work for the [facility] will be done in the  Main lab at [facility] on 1st floor. If you have a lab appointment with the [facility] please come in thru the  Main Entrance and check in at the main information desk  You need to re-schedule your appointment should you arrive 10 or more minutes late.  We strive to give you quality time with our providers, and arriving late affects you and other patients whose appointments are after yours.  Also, if you no show three or more times for appointments you may be dismissed from the clinic at the providers discretion.     Again, thank you for choosing [facility].  Our hope is that these requests will decrease the amount of time that you wait before being seen by our physicians.       _____________________________________________________________  Should you have questions after your visit to [facility], please contact our office at [phone] between the hours of 8:30 a.m. and 4:30 p.m.  Voicemails left after 4:30 p.m.  will not be returned until the following business day.  For prescription refill requests, have your pharmacy contact our office.         Resources For Cancer Patients and their Caregivers ? American Cancer Society: Can assist with transportation, wigs, general needs, runs Look Good Feel Better.        ([phone] 793-2813 ? Cancer Care: Provides financial assistance, online support groups, medication/co-pay assistance.  1-800-813-HOPE 910-741-7333) ? [place] Assists [place] cancer patients and their families through emotional , educational and financial support.  260-305-6594 ? [place] DSS Where to apply for food stamps, Medicaid and utility assistance. 810-167-9753 ? [facility]: Transportation to medical appointments. 256-690-5582 ? Social Security Administration: May apply for disability if have a Stage IV cancer. 517-357-5206 702-681-5144 ? [employer], Disability and Transit Services: Assists with nutrition, care and transit needs. [place] Support Programs: @10RELATIVEDAYS @ > Cancer Support Group  2nd Tuesday of the month 1pm-2pm, Journey Room  > Creative Journey  3rd Tuesday of the month 1130am-1pm, Journey Room  > Look Good Feel Better  1st Wednesday of the month 10am-12 noon, Journey Room (Call [place] to register [phone])

## 2015-08-20 NOTE — Assessment & Plan Note (Signed)
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 Barry Dienes on 08/02/2015.  Oncology history developed.  Staging in CH[facility] problem list is completed.  I personally reviewed and went over pathology results with the patient.

## 2015-08-23 ENCOUNTER — Other Ambulatory Visit: Payer: Self-pay | Admitting: Family Medicine

## 2015-08-23 ENCOUNTER — Other Ambulatory Visit: Payer: Self-pay | Admitting: Cardiovascular Disease

## 2015-08-24 ENCOUNTER — Telehealth (HOSPITAL_COMMUNITY): Payer: Self-pay | Admitting: Oncology

## 2015-08-24 ENCOUNTER — Other Ambulatory Visit (HOSPITAL_COMMUNITY): Payer: Self-pay | Admitting: *Deleted

## 2015-08-24 DIAGNOSIS — C169 Malignant neoplasm of stomach, unspecified: Secondary | ICD-10-CM

## 2015-08-24 NOTE — Telephone Encounter (Signed)
IR called requesting patient stop his Plavix 5 days prior to port placement.  I will ask nursing to call patient with directions.  Once appt is made for IR port placement, we can tell the patient the exact date to start holding his Plavix.  Nursing: please call patient with this information.  He can restart Plavix 24 hours post-port placement.  Robynn Pane, PA-C 08/24/2015 11:57 AM

## 2015-08-24 NOTE — Telephone Encounter (Signed)
Rx(s) sent to pharmacy electronically.  

## 2015-08-30 ENCOUNTER — Encounter: Payer: Commercial Managed Care - HMO | Admitting: Genetic Counselor

## 2015-08-30 ENCOUNTER — Other Ambulatory Visit: Payer: Commercial Managed Care - HMO

## 2015-09-04 MED ORDER — PROCHLORPERAZINE MALEATE 10 MG PO TABS: 10.0000 mg | ORAL_TABLET | Freq: Four times a day (QID) | ORAL | Status: AC | PRN

## 2015-09-04 MED ORDER — ONDANSETRON HCL 8 MG PO TABS: 8.0000 mg | ORAL_TABLET | Freq: Three times a day (TID) | ORAL | Status: AC | PRN

## 2015-09-04 MED ORDER — LIDOCAINE-PRILOCAINE 2.5-2.5 % EX CREA
TOPICAL_CREAM | CUTANEOUS | Status: DC
Start: 2015-09-04 — End: 2015-10-18

## 2015-09-04 NOTE — Telephone Encounter (Signed)
Patient's friend Stanton Kidney notified that pt needed to stop Plavix 5 days prior to port placement. She was well aware of the instructions and said that "they" had already mailed her a letter about it. I then instructed her that he could restart his Plavix 24 hours after the port placement. She said ok to all instructions.

## 2015-09-06 ENCOUNTER — Other Ambulatory Visit (HOSPITAL_COMMUNITY): Payer: Self-pay | Admitting: Hematology & Oncology

## 2015-09-08 ENCOUNTER — Encounter (HOSPITAL_COMMUNITY): Payer: Commercial Managed Care - HMO | Attending: Hematology & Oncology

## 2015-09-08 DIAGNOSIS — D649 Anemia, unspecified: Secondary | ICD-10-CM | POA: Insufficient documentation

## 2015-09-08 DIAGNOSIS — R918 Other nonspecific abnormal finding of lung field: Secondary | ICD-10-CM | POA: Insufficient documentation

## 2015-09-08 DIAGNOSIS — C180 Malignant neoplasm of cecum: Secondary | ICD-10-CM

## 2015-09-08 DIAGNOSIS — C189 Malignant neoplasm of colon, unspecified: Secondary | ICD-10-CM | POA: Insufficient documentation

## 2015-09-08 DIAGNOSIS — C169 Malignant neoplasm of stomach, unspecified: Secondary | ICD-10-CM

## 2015-09-09 ENCOUNTER — Other Ambulatory Visit (HOSPITAL_COMMUNITY): Payer: Commercial Managed Care - HMO

## 2015-09-09 ENCOUNTER — Encounter (HOSPITAL_COMMUNITY): Payer: Commercial Managed Care - HMO | Admitting: Genetic Counselor

## 2015-09-10 ENCOUNTER — Ambulatory Visit (HOSPITAL_COMMUNITY): Payer: Commercial Managed Care - HMO | Admitting: Hematology & Oncology

## 2015-09-14 ENCOUNTER — Encounter (HOSPITAL_COMMUNITY): Payer: Self-pay | Admitting: Hematology & Oncology

## 2015-09-14 ENCOUNTER — Encounter (HOSPITAL_BASED_OUTPATIENT_CLINIC_OR_DEPARTMENT_OTHER): Payer: Commercial Managed Care - HMO | Admitting: Hematology & Oncology

## 2015-09-14 ENCOUNTER — Other Ambulatory Visit: Payer: Self-pay | Admitting: Physician Assistant

## 2015-09-14 VITALS — BP 115/55 | HR 60 | Temp 98.1°F | Resp 16 | Wt 156.6 lb

## 2015-09-14 DIAGNOSIS — C169 Malignant neoplasm of stomach, unspecified: Secondary | ICD-10-CM

## 2015-09-14 DIAGNOSIS — C19 Malignant neoplasm of rectosigmoid junction: Secondary | ICD-10-CM | POA: Diagnosis not present

## 2015-09-14 DIAGNOSIS — C180 Malignant neoplasm of cecum: Secondary | ICD-10-CM | POA: Diagnosis not present

## 2015-09-14 DIAGNOSIS — D509 Iron deficiency anemia, unspecified: Secondary | ICD-10-CM

## 2015-09-14 DIAGNOSIS — Z720 Tobacco use: Secondary | ICD-10-CM

## 2015-09-14 NOTE — Progress Notes (Signed)
[facility]  Progress Note  Patient Care Team: Worthy Rancher, MD as PCP - General (Family Medicine) Daneil Dolin, MD as Consulting Physician (Gastroenterology)  CHIEF COMPLAINTS/PURPOSE OF CONSULTATION:    Gastric adenocarcinoma [facility])   06/17/2015 Procedure Stomach biopsy of pyloric lesion, Dr. 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   06/28/2015 PET scan Intensely hypermetabolic cecal mass. No definitive evidence of metastatic disease. Scattered upper lobe predominant pulmonary nodules are too small for PET resolution.    08/02/2015 Procedure Antral resection of tumor of stomach by Dr. Stark Klein   08/06/2015 Pathology Results Invasive adenocarcinoma, poorly differentiated, 3.0 cm, extending into the muscularis propria, LVI +, 8/16 positive lymph nodes for metastatic disease, negative margins.   08/06/2015 Cancer Staging pT2N3A    Procedure Port scheduled for 7/19    Chemotherapy FOLFOX scheduled to start on 7/24    Adenocarcinoma of cecum ([place])   06/10/2015 Procedure Colonoscopy with Dr. Gala Romney, Cecal neoplasm. large polyp at hepatic flexure. lesion tattooed, large polyp in mid sigmoid (lesion not completely removed)   06/10/2015 Pathology Results Cecal mass - adenocarcinoma, polyp at hepatic flexure foci of adenocarcinoma arising from tubulovillous adenoma   06/28/2015 PET scan Intensely hypermetabolic cecal mass. No definitive evidence of metastatic disease. Scattered upper lobe predominant pulmonary nodules are too small for PET resolution.    08/02/2015 Procedure Segmental resection of tumor, right, transverse, descending colon by Dr. Stark Klein   08/06/2015 Pathology Results Invasive adenocarcinoma, 4.3 cm, well-differentiated, extending through the muscularis propria into the pericolonic soft tissue. 2/14 lymph nodes positive  for metastatic disease. Surgical margins are negative.   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 Gala Romney, Cecal neoplasm. large polyp at hepatic flexure. lesion tattooed, large polyp in mid sigmoid (lesion not completely removed)   06/10/2015 Pathology Results Polyp sigmoid - tubulovillous adenoma with at least high grade dysplasia cannot rule out focal adenocarcinoma   06/28/2015 PET scan Intensely hypermetabolic cecal mass. No definitive evidence of metastatic disease. Scattered upper lobe predominant pulmonary nodules are too small for PET resolution.    08/02/2015 Procedure Segmental resection for tumor of rectosigmoid colon by Dr. Stark Klein.   08/06/2015 Pathology Results Invasive adenocarcinoma, 2.6 cm, well-differentiated, extending through the muscularis propria and into pericolonic soft tissue, 0/10 nodes involved, negative resection margins. No LVI.   08/06/2015 Cancer Staging pT3N0    HISTORY OF PRESENTING ILLNESS:  LEVIAN ARRIETA 70 y.o. male is here for further follow-up of T2N3 Gastric Carcinoma, T3N1B adenocarcinoma of the cecum and T3N0 adenocarcinoma of the rectosigmoid junction.  Mr. Rausch returns to the [facility] today accompanied by his one of his neighbors.  He notes that he is willing to proceed with IV chemotherapy, realistically wanting to do the most effective therapy.  He notes that he understands he will have to be cautious in the hot weather. He just wants to still be able to be outside. 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   He has a port placement scheduled tomorrow. He has attended chemotherapy class, he reports no questions.  He unfortunately continues to smoke.    MEDICAL HISTORY:  Past Medical History  Diagnosis Date  . GERD (gastroesophageal reflux disease)   . STEMI (ST elevation myocardial infarction), 01/19/13 01/19/2013  . Hyperlipidemia LDL goal <  70  01/19/2013  . Pulmonary nodules 01/19/2013  . Hypokalemia 01/20/2013  . Tobacco abuse 01/20/2013  . Metabolic syndrome, with mildly elevated HgBA1C 01/20/2013  . Gastric adenocarcinoma ([place]) 06/29/2015  . Hypertension   . CAD (coronary artery disease) RCA non dominant vessel, LAD 40%, 80-90% 2nd diag EF 55%  01/19/2013    Dr. Claiborne Billings [place] 03-05-15 Epic.  . Gastric ulcer     many yrs ago  . Transfusion history     with gastric ulcer- many yrs ago  . Adenocarcinoma of cecum ([place]) 07/01/2015  . 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 Appendectomy    . Left heart cath N/A 01/19/2013    Procedure: LEFT HEART CATH;  Surgeon: Troy Sine, MD;  Location: [facility] CATH LAB;  Service: Cardiovascular;  Laterality: N/A;  . Colonoscopy N/A 06/10/2015    RMR: cecal neoplasm  ? biopsied. large polyp at the hepatic flexure removed with piecmeal polypectomy and APC ablation. lesion tattooed. Large polyp in the mid sigmoid status post piecemeal hot snare debulking and tattooing. this lesion not completely removed. scatterd pancolonic diverticulsosis. no speciments collected.   . Esophagogastroduodenoscopy N/A 06/10/2015    Procedure: ESOPHAGOGASTRODUODENOSCOPY (EGD);  Surgeon: Daneil Dolin, MD;  Location: [facility] ENDO SUITE;  Service: Endoscopy;  Laterality: N/A;  . Esophagogastroduodenoscopy N/A 06/17/2015    RMR: normal esophagus small hiatal hernia. Abnormal nodular antrum .pyloric channel status post biopsy.   Marland Kitchen Hernia repair Right   . Cataract extraction, bilateral    . Laparoscopic subtotal colectomy N/A 08/02/2015    Procedure: LAPAROSCOPIC SUBTOTAL COLECTOMY  AND DISTAL GASTRECTOMY ;  Surgeon: Stark Klein, MD;  Location: [facility] ORS;  Service: General;  Laterality: N/A;    SOCIAL HISTORY: Social History   Social History  . Marital Status: Widowed    Spouse Name: N/A  . Number of Children: N/A  . Years of Education: N/A    Occupational History  . farmer    Social History Main Topics  . Smoking status: Current Every Day Smoker -- 1.00 packs/day for 58 years    Types: Cigarettes    Start date: 01/21/1963  . Smokeless tobacco: Never Used     Comment: one pack daily  . Alcohol Use: No     Comment: none in 15 yrs -heavy user-none now.  . Drug Use: No  . Sexual Activity: Not on file   Other Topics Concern  . Not on file   Social History Narrative  Widowed since 2003 Has 2 step kids Still smokes No ETOH use Retired. Used to drive a truck cross country for 45 years.   FAMILY HISTORY: Family History  Problem Relation Age of Onset  . Cancer Father     colon  . Cancer Sister     colon  1 brother and 1 sister living Oldest brother died; sister died from stomach cancer; Other sister died of colon cancer Father died of colon cancer at 58 years old. Mother died of a heart attack in her 50s.  ALLERGIES:  is allergic to contrast media.  MEDICATIONS:  Current Outpatient Prescriptions  Medication Sig Dispense Refill  . atorvastatin (LIPITOR) 40 MG tablet TAKE 1 TABLET BY MOUTH EVERY DAY 90 tablet 0  . carisoprodol (SOMA) 350 MG tablet Take 1 tablet (350 mg total) by mouth 3 (three) times daily as needed for muscle spasms. 30 tablet 0  . clopidogrel (PLAVIX) 75 MG tablet TAKE  1 TABLET BY MOUTH DAILY WITH BREAKFAST 30 tablet 3  . dextrose 5 % SOLN 1,000 mL with fluorouracil 5 GM/100ML SOLN Inject into the vein. EVERY 14 DAYS. TO BEGIN 09/20/15. PUMP TO BE WORN FOR 46 HOURS.    Marland Kitchen doxycycline (ADOXA) 100 MG tablet Take 1 tablet (100 mg total) by mouth 2 (two) times daily. (Patient taking differently: Take 100 mg by mouth 2 (two) times daily. Take for 7 days starting on 07/19/15.) 14 tablet 0  . fluticasone (FLONASE) 50 MCG/ACT nasal spray Place 1 spray into both nostrils 2 (two) times daily as needed for allergies or rhinitis. 16 g 6  . leucovorin in dextrose 5 % 250 mL Inject into the vein once. EVERY 14  DAYS. TO BEGIN 09/20/15    . lidocaine-prilocaine (EMLA) cream Apply a quarter size amount to port site 1 hour prior to chemo. Do not rub in. Cover with plastic wrap. 30 g 3  . lisinopril (PRINIVIL,ZESTRIL) 5 MG tablet TAKE 1 TABLET BY MOUTH EVERY DAY 90 tablet 0  . loperamide (IMODIUM A-D) 2 MG tablet Take 1 tablet (2 mg total) by mouth 4 (four) times daily as needed for diarrhea or loose stools. 30 tablet 0  . ondansetron (ZOFRAN) 8 MG tablet Take 1 tablet (8 mg total) by mouth every 8 (eight) hours as needed for nausea or vomiting. 30 tablet 2  . OXALIPLATIN IV Inject into the vein. EVERY 14 DAYS - TO BEGIN 09/20/15    . oxyCODONE (OXY IR/ROXICODONE) 5 MG immediate release tablet Take 1-2 tablets (5-10 mg total) by mouth every 4 (four) hours as needed for moderate pain, severe pain or breakthrough pain. 30 tablet 0  . pantoprazole (PROTONIX) 40 MG tablet TAKE 1 TABLET BY MOUTH DAILY AT 6:00 A.M. 30 tablet 3  . prochlorperazine (COMPAZINE) 10 MG tablet Take 1 tablet (10 mg total) by mouth every 6 (six) hours as needed for nausea or vomiting. 30 tablet 2  . VENTOLIN HFA 108 (90 Base) MCG/ACT inhaler INHALE 2 PUFFS BY MOUTH EVERY SIX HOURS AS NEEDED FOR WHEEZING OR SHORTNESS OF BREATH. 18 g 2   No current facility-administered medications for this visit.   Facility-Administered Medications Ordered in Other Visits  Medication Dose Route Frequency Provider Last Rate Last Dose  . 0.9 %  sodium chloride infusion   Intravenous Continuous Ardis Rowan, PA-C 10 mL/hr at 09/15/15 1217      Review of Systems  Constitutional: Negative.  Negative for fever, chills, weight loss and malaise/fatigue.  HENT: Negative.  Negative for congestion, hearing loss, nosebleeds, sore throat and tinnitus.   Eyes: Negative.  Negative for blurred vision, double vision, pain and discharge.  Respiratory: Negative.  Negative for cough, hemoptysis, sputum production, shortness of breath and wheezing.   Cardiovascular:  Negative.  Negative for chest pain, palpitations, claudication, leg swelling and PND.  Gastrointestinal: Negative.  Negative for heartburn, nausea, vomiting, abdominal pain, diarrhea, constipation, blood in stool and melena.  Genitourinary: Negative.  Negative for dysuria, urgency, frequency and hematuria.  Musculoskeletal: Negative.  Negative for myalgias, joint pain and falls.  Skin: Negative.  Negative for itching and rash.  Neurological: Negative.  Negative for dizziness, tingling, tremors, sensory change, speech change, focal weakness, seizures, loss of consciousness, weakness and headaches.  Endo/Heme/Allergies: Negative.  Does not bruise/bleed easily.  Psychiatric/Behavioral: Negative.  Negative for depression, suicidal ideas, memory loss and substance abuse. The patient is not nervous/anxious and does not have insomnia.   All other systems reviewed  and are negative.  14 point ROS was done and is otherwise as detailed above or in HPI   PHYSICAL EXAMINATION: ECOG PERFORMANCE STATUS: 0 - Asymptomatic  Filed Vitals:   09/14/15 1137  BP: 115/55  Pulse: 60  Temp: 98.1 F (36.7 C)  Resp: 16   Filed Weights   09/14/15 1137  Weight: 156 lb 9.6 oz (71.033 kg)    Physical Exam  Constitutional: He is oriented to person, place, and time and well-developed, well-nourished, and in no distress.  HENT:  Head: Normocephalic and atraumatic.  Nose: Nose normal.  Mouth/Throat: Oropharynx is clear and moist. No oropharyngeal exudate.  Eyes: Conjunctivae and EOM are normal. Pupils are equal, round, and reactive to light. Right eye exhibits no discharge. Left eye exhibits no discharge. No scleral icterus.  Neck: Normal range of motion. Neck supple. No tracheal deviation present. No thyromegaly present.  Cardiovascular: Normal rate, regular rhythm and normal heart sounds.  Exam reveals no gallop and no friction rub.   No murmur heard. Pulmonary/Chest: Effort normal and breath sounds normal. He  has no wheezes. He has no rales.  Abdominal: Soft. Bowel sounds are normal. He exhibits no distension and no mass. There is no tenderness. There is no rebound and no guarding.  Well healed surgical incision site  Musculoskeletal: Normal range of motion. He exhibits no edema.  Lymphadenopathy:    He has no cervical adenopathy.  Neurological: He is alert and oriented to person, place, and time. He has normal reflexes. No cranial nerve deficit. Gait normal. Coordination normal.  Skin: Skin is warm and dry. No rash noted.  Psychiatric: Mood, memory, affect and judgment normal.  Nursing note and vitals reviewed.   LABORATORY DATA:  I have reviewed the data as listed Lab Results  Component Value Date   WBC 8.3 09/15/2015   HGB 10.2* 09/15/2015   HCT 31.3* 09/15/2015   MCV 87.7 09/15/2015   PLT 231 09/15/2015   CMP     Component Value Date/Time   NA 137 08/07/2015 0343   K 4.1 08/07/2015 0343   CL 103 08/07/2015 0343   CO2 26 08/07/2015 0343   GLUCOSE 138* 08/07/2015 0343   BUN 13 08/07/2015 0343   CREATININE 1.08 08/07/2015 0343   CREATININE 1.32* 04/13/2015 1252   CALCIUM 8.7* 08/07/2015 0343   PROT 6.4 04/13/2015 1252   ALBUMIN 3.7 04/13/2015 1252   AST 14 04/13/2015 1252   ALT 8* 04/13/2015 1252   ALKPHOS 107 04/13/2015 1252   BILITOT 0.3 04/13/2015 1252   GFRNONAA >60 08/07/2015 0343   GFRAA >60 08/07/2015 0343   Results for FARID, BERGUM (MRN [id number]) as of 07/10/2015 21:03  Ref. Range 06/21/2015 16:10  Iron Latest Ref Range: 45-182 ug/dL 13 (L)  UIBC Latest Units: ug/dL 331  TIBC Latest Ref Range: 250-450 ug/dL 344  Saturation Ratios Latest Ref Range: 17.9-39.5 % 4 (L)  Ferritin Latest Ref Range: 24-336 ng/mL 13 (L)  Folate Latest Ref Range: >5.9 ng/mL 15.2  CEA Latest Ref Range: 0.0-4.7 ng/mL 8.0 (H)    RADIOGRAPHIC STUDIES: I have personally reviewed the radiological images as listed and agreed with the findings in the report. Ir Fluoro Guide Cv Line  Right  09/15/2015  CLINICAL DATA:  Colon carcinoma and need for porta cath to begin chemotherapy. EXAM: IMPLANTED PORT A CATH PLACEMENT WITH ULTRASOUND AND FLUOROSCOPIC GUIDANCE ANESTHESIA/SEDATION: 3.0 Mg IV Versed; 50 mcg IV Fentanyl Total Moderate Sedation Time:  33 minutes The patient's level of consciousness  and physiologic status were continuously monitored during the procedure by Radiology nursing. Additional Medications: 2 g IV Ancef. As antibiotic prophylaxis, Ancef was ordered pre-procedure and administered intravenously within one hour of incision. FLUOROSCOPY TIME:  12 seconds. PROCEDURE: The procedure, risks, benefits, and alternatives were explained to the patient. Questions regarding the procedure were encouraged and answered. The patient understands and consents to the procedure. A time-out was performed prior to initiating the procedure. Ultrasound was used to confirm patency of the right internal jugular vein. The right neck and chest were prepped with chlorhexidine in a sterile fashion, and a sterile drape was applied covering the operative field. Maximum barrier sterile technique with sterile gowns and gloves were used for the procedure. Local anesthesia was provided with 1% lidocaine. After creating a small venotomy incision, a 21 gauge needle was advanced into the right internal jugular vein under direct, real-time ultrasound guidance. Ultrasound image documentation was performed. After securing guidewire access, an 8 Fr dilator was placed. A J-wire was kinked to measure appropriate catheter length. A subcutaneous port pocket was then created along the upper chest wall utilizing sharp and blunt dissection. Portable cautery was utilized. The pocket was irrigated with sterile saline. A single lumen power injectable port was chosen for placement. The 8 Fr catheter was tunneled from the port pocket site to the venotomy incision. The port was placed in the pocket. External catheter was trimmed to  appropriate length based on guidewire measurement. At the venotomy, an 8 Fr peel-away sheath was placed over a guidewire. The catheter was then placed through the sheath and the sheath removed. Final catheter positioning was confirmed and documented with a fluoroscopic spot image. The port was accessed with a needle and aspirated and flushed with heparinized saline. The needle was removed. The venotomy and port pocket incisions were closed with subcutaneous 3-0 Monocryl and subcuticular 4-0 Vicryl. Dermabond was applied to both incisions. COMPLICATIONS: None FINDINGS: After catheter placement, the tip lies at the cavoatrial junction. The catheter aspirates normally and is ready for immediate use. IMPRESSION: Placement of single lumen port a cath via right internal jugular vein. The catheter tip lies at the cavoatrial junction. A power injectable port a cath was placed and is ready for immediate use. Electronically Signed   By: Aletta Edouard M.D.   On: 09/15/2015 15:16   Ir US Guide Vasc Access Right  09/15/2015  CLINICAL DATA:  Colon carcinoma and need for porta cath to begin chemotherapy. EXAM: IMPLANTED PORT A CATH PLACEMENT WITH ULTRASOUND AND FLUOROSCOPIC GUIDANCE ANESTHESIA/SEDATION: 3.0 Mg IV Versed; 50 mcg IV Fentanyl Total Moderate Sedation Time:  33 minutes The patient's level of consciousness and physiologic status were continuously monitored during the procedure by Radiology nursing. Additional Medications: 2 g IV Ancef. As antibiotic prophylaxis, Ancef was ordered pre-procedure and administered intravenously within one hour of incision. FLUOROSCOPY TIME:  12 seconds. PROCEDURE: The procedure, risks, benefits, and alternatives were explained to the patient. Questions regarding the procedure were encouraged and answered. The patient understands and consents to the procedure. A time-out was performed prior to initiating the procedure. Ultrasound was used to confirm patency of the right internal jugular  vein. The right neck and chest were prepped with chlorhexidine in a sterile fashion, and a sterile drape was applied covering the operative field. Maximum barrier sterile technique with sterile gowns and gloves were used for the procedure. Local anesthesia was provided with 1% lidocaine. After creating a small venotomy incision, a 21 gauge needle was advanced into  the right internal jugular vein under direct, real-time ultrasound guidance. Ultrasound image documentation was performed. After securing guidewire access, an 8 Fr dilator was placed. A J-wire was kinked to measure appropriate catheter length. A subcutaneous port pocket was then created along the upper chest wall utilizing sharp and blunt dissection. Portable cautery was utilized. The pocket was irrigated with sterile saline. A single lumen power injectable port was chosen for placement. The 8 Fr catheter was tunneled from the port pocket site to the venotomy incision. The port was placed in the pocket. External catheter was trimmed to appropriate length based on guidewire measurement. At the venotomy, an 8 Fr peel-away sheath was placed over a guidewire. The catheter was then placed through the sheath and the sheath removed. Final catheter positioning was confirmed and documented with a fluoroscopic spot image. The port was accessed with a needle and aspirated and flushed with heparinized saline. The needle was removed. The venotomy and port pocket incisions were closed with subcutaneous 3-0 Monocryl and subcuticular 4-0 Vicryl. Dermabond was applied to both incisions. COMPLICATIONS: None FINDINGS: After catheter placement, the tip lies at the cavoatrial junction. The catheter aspirates normally and is ready for immediate use. IMPRESSION: Placement of single lumen port a cath via right internal jugular vein. The catheter tip lies at the cavoatrial junction. A power injectable port a cath was placed and is ready for immediate use. Electronically Signed    By: Aletta Edouard M.D.   On: 09/15/2015 15:16       PATHOLOGY:   ASSESSMENT & PLAN:  Adenocarcinoma of cecum ([place])   Staging form: Colon and Rectum, AJCC 7th Edition     Pathologic stage from 08/06/2015: Stage IIIB (T3, N1b, cM0) - Signed by Baird Cancer, PA-C on 08/20/2015 Adenocarcinoma of rectosigmoid junction [facility])   Staging form: Colon and Rectum, AJCC 7th Edition     Pathologic stage from 08/06/2015: Stage IIA (T3, N0, cM0) - Signed by Baird Cancer, PA-C on 08/20/2015 Gastric adenocarcinoma [facility])   Staging form: Stomach, AJCC 7th Edition     Pathologic stage from 08/06/2015: Stage IIIA (T2, N3a, cM0) - Signed by Baird Cancer, PA-C on 08/20/2015 Anemia Iron deficiency s/p one dose Injectafer Elevated CEA Bilateral Pulmonary nodules Tobacco Abuse  He had iron deficiency prior to surgery and was given one dose of injectafer. Counts will be rechecked next week. If his anemia remains will work up further. CEA will also be checked on D1 of therapy.  Gastric adenocarcinoma ([place]) Stage IIIA (T2N3AM0) invasive adenocarcinoma of antrum of stomach with 8/16 lymph nodes involved with metastatic disease having undergone definitive resection by Dr. Barry Dienes on 08/02/2015.  Oncology history updated.  He is scheduled for port placement tomorrow with IR.    Given his gastric carcinoma I feel FOLFOX is the treatment of choice.  I do not feel XELODA single agent would be adequate.  We had a long discussion regarding his options.  He is concerned about his ability to work during treatment.  He is educated that most patient's can work during treatment.  He is educated on good hydration during treatment and certainly being sensible in the heat. He notes that he understands.  He is scheduled for genetic counseling at [facility] on 10/07/2015.  He knows that he is to call us with any issues associated with treatment.  Return as scheduled for treatment and return as scheduled for  follow-up at [facility].  Adenocarcinoma of cecum ([place]) Stage IIIB (  T3N1BM0) invasive adenocarcinoma of cecum, S/P resection by Dr. Barry Dienes on 08/02/2015.  His Stage IIIB disease absolutely meets criteria for adjuvant chemotherapy with FOLFOX.  HOWEVER, his simultaneous diagnosis of adenocarcinoma of stomach is the patient's biggest mortality.  As mentioned above, the patient's case was reviewed with Dr. Benay Spice (GI Med Onc).  FOLFOX is recommended.  Adenocarcinoma of rectosigmoid junction ([facility]) Stage IIA (T3N0M0) invasive adenocarcinoma of rectosigmoid colon, S/P surgical resection by Dr. Barry Dienes on 08/02/2015.  I personally reviewed and went over pathology results with the patient.  All questions were answered. The patient knows to call the clinic with any problems, questions or concerns.  This document serves as a record of services personally performed by Ancil Linsey, MD. It was created on her behalf by Toni Amend, a trained medical scribe. The creation of this record is based on the scribe's personal observations and the provider's statements to them. This document has been checked and approved by the attending provider.  I have reviewed the above documentation for accuracy and completeness, and I agree with the above.  This note was electronically signed.    Molli Hazard, MD  09/15/2015 6:31 PM

## 2015-09-14 NOTE — Assessment & Plan Note (Addendum)
Stage IIIA (T2N3AM0) invasive adenocarcinoma of antrum of stomach with 8/16 lymph nodes involved with metastatic disease having undergone definitive resection by Dr. Barry Dienes on 08/02/2015.  Oncology history updated.  He is scheduled for port placement tomorrow with IR.    He is not a candidate for Xeloda-based therapy particularly for his gastric cancer.  FOLFOX is the treatment of choice.    We had a long discussion regarding his options.  He is concerned about his ability to work during treatment.  He is educated that most patient's can work during treatment.  He is educated on good hydration during treatment.  He is scheduled for genetic counseling at [facility] on 10/07/2015.  He knows that he is to call us with any issues associated with treatment.  Return as scheduled for treatment and return as scheduled for follow-up at [facility].

## 2015-09-14 NOTE — Patient Instructions (Signed)
[place] at [facility] Discharge Instructions  RECOMMENDATIONS MADE BY THE CONSULTANT AND ANY TEST RESULTS WILL BE SENT TO YOUR REFERRING PHYSICIAN.  You saw Dr. Gershon Mussel today.  Continue your treatment and follow up as scheduled. Have port placed tomorrow as scheduled. Pre-treatment labs as scheduled.   Thank you for choosing [place] at [facility] to provide your oncology and hematology care.  To afford each patient quality time with our provider, please arrive at least 15 minutes before your scheduled appointment time.   Beginning January 23rd 2017 lab work for the [facility] will be done in the  Main lab at [facility] on 1st floor. If you have a lab appointment with the [place] please come in thru the  Main Entrance and check in at the main information desk  You need to re-schedule your appointment should you arrive 10 or more minutes late.  We strive to give you quality time with our providers, and arriving late affects you and other patients whose appointments are after yours.  Also, if you no show three or more times for appointments you may be dismissed from the clinic at the providers discretion.     Again, thank you for choosing [facility].  Our hope is that these requests will decrease the amount of time that you wait before being seen by our physicians.       _____________________________________________________________  Should you have questions after your visit to [facility], please contact our office at [phone] between the hours of 8:30 a.m. and 4:30 p.m.  Voicemails left after 4:30 p.m. will not be returned until the following business day.  For prescription refill requests, have your pharmacy contact our office.         Resources For Cancer Patients and their Caregivers ? American Cancer Society: Can assist with transportation, wigs, general needs, runs Look Good Feel Better.         ([phone] ? Cancer Care: Provides financial assistance, online support groups, medication/co-pay assistance.  1-800-813-HOPE 207 131 7427) ? [facility] Assists [place] cancer patients and their families through emotional , educational and financial support.  917 137 8503 ? [place] DSS Where to apply for food stamps, Medicaid and utility assistance. 813-628-9819 ? [facility]: Transportation to medical appointments. 936 600 9453 ? Social Security Administration: May apply for disability if have a Stage IV cancer. 838-757-0122 (575)297-2748 ? [employer], Disability and Transit Services: Assists with nutrition, care and transit needs. [place] Support Programs: @10RELATIVEDAYS @ > Cancer Support Group  2nd Tuesday of the month 1pm-2pm, Journey Room  > Creative Journey  3rd Tuesday of the month 1130am-1pm, Journey Room  > Look Good Feel Better  1st Wednesday of the month 10am-12 noon, Journey Room (Call [place] to register [phone])

## 2015-09-14 NOTE — Assessment & Plan Note (Signed)
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 Barry Dienes on 08/02/2015.  I personally reviewed and went over pathology results with the patient.

## 2015-09-14 NOTE — Assessment & Plan Note (Addendum)
Stage IIIB (T3N1BM0) invasive adenocarcinoma of cecum, S/P resection by Dr. Barry Dienes on 08/02/2015.  His Stage IIIB disease absolutely meets criteria for adjuvant chemotherapy with FOLFOX.  HOWEVER, his simultaneous diagnosis of adenocarcinoma of stomach is the patient's biggest mortality.  As mentioned above, the patient's case was reviewed with Dr. Benay Spice (GI Med Onc).  FOLFOX is recommended.

## 2015-09-15 ENCOUNTER — Encounter (HOSPITAL_COMMUNITY): Payer: Self-pay | Admitting: Hematology & Oncology

## 2015-09-15 ENCOUNTER — Ambulatory Visit (HOSPITAL_COMMUNITY)
Admission: RE | Admit: 2015-09-15 | Discharge: 2015-09-15 | Disposition: A | Discharge status: Home / Self Care | Payer: Commercial Managed Care - HMO | Source: Ambulatory Visit | Attending: Oncology | Admitting: Oncology

## 2015-09-15 ENCOUNTER — Other Ambulatory Visit (HOSPITAL_COMMUNITY): Payer: Self-pay | Admitting: Oncology

## 2015-09-15 ENCOUNTER — Encounter (HOSPITAL_COMMUNITY): Payer: Self-pay

## 2015-09-15 DIAGNOSIS — Z8719 Personal history of other diseases of the digestive system: Secondary | ICD-10-CM | POA: Insufficient documentation

## 2015-09-15 DIAGNOSIS — Z7902 Long term (current) use of antithrombotics/antiplatelets: Secondary | ICD-10-CM | POA: Diagnosis not present

## 2015-09-15 DIAGNOSIS — F17210 Nicotine dependence, cigarettes, uncomplicated: Secondary | ICD-10-CM | POA: Diagnosis not present

## 2015-09-15 DIAGNOSIS — C189 Malignant neoplasm of colon, unspecified: Secondary | ICD-10-CM | POA: Insufficient documentation

## 2015-09-15 DIAGNOSIS — Z85028 Personal history of other malignant neoplasm of stomach: Secondary | ICD-10-CM | POA: Diagnosis not present

## 2015-09-15 DIAGNOSIS — Z7951 Long term (current) use of inhaled steroids: Secondary | ICD-10-CM | POA: Insufficient documentation

## 2015-09-15 DIAGNOSIS — Z800 Family history of malignant neoplasm of digestive organs: Secondary | ICD-10-CM | POA: Diagnosis not present

## 2015-09-15 DIAGNOSIS — E785 Hyperlipidemia, unspecified: Secondary | ICD-10-CM | POA: Insufficient documentation

## 2015-09-15 DIAGNOSIS — Z8711 Personal history of peptic ulcer disease: Secondary | ICD-10-CM | POA: Diagnosis not present

## 2015-09-15 DIAGNOSIS — I252 Old myocardial infarction: Secondary | ICD-10-CM | POA: Diagnosis not present

## 2015-09-15 DIAGNOSIS — Z85038 Personal history of other malignant neoplasm of large intestine: Secondary | ICD-10-CM | POA: Diagnosis not present

## 2015-09-15 DIAGNOSIS — I10 Essential (primary) hypertension: Secondary | ICD-10-CM | POA: Diagnosis not present

## 2015-09-15 DIAGNOSIS — Z5111 Encounter for antineoplastic chemotherapy: Secondary | ICD-10-CM | POA: Diagnosis not present

## 2015-09-15 DIAGNOSIS — C169 Malignant neoplasm of stomach, unspecified: Secondary | ICD-10-CM

## 2015-09-15 DIAGNOSIS — I2510 Atherosclerotic heart disease of native coronary artery without angina pectoris: Secondary | ICD-10-CM | POA: Diagnosis not present

## 2015-09-15 DIAGNOSIS — K219 Gastro-esophageal reflux disease without esophagitis: Secondary | ICD-10-CM | POA: Diagnosis not present

## 2015-09-15 DIAGNOSIS — R918 Other nonspecific abnormal finding of lung field: Secondary | ICD-10-CM | POA: Insufficient documentation

## 2015-09-15 LAB — CBC
HEMATOCRIT: 31.3 % — AB (ref 39.0–52.0)
Hemoglobin: 10.2 g/dL — ABNORMAL LOW (ref 13.0–17.0)
MCH: 28.6 pg (ref 26.0–34.0)
MCHC: 32.6 g/dL (ref 30.0–36.0)
MCV: 87.7 fL (ref 78.0–100.0)
PLATELETS: 231 10*3/uL (ref 150–400)
RBC: 3.57 MIL/uL — AB (ref 4.22–5.81)
RDW: 18.3 % — ABNORMAL HIGH (ref 11.5–15.5)
WBC: 8.3 10*3/uL (ref 4.0–10.5)

## 2015-09-15 LAB — PROTIME-INR
INR: 1.02 (ref 0.00–1.49)
Prothrombin Time: 13.6 seconds (ref 11.6–15.2)

## 2015-09-15 LAB — APTT: APTT: 34 s (ref 24–37)

## 2015-09-15 MED ORDER — FENTANYL CITRATE (PF) 100 MCG/2ML IJ SOLN
INTRAMUSCULAR | Status: AC
  Filled 2015-09-15: qty 4

## 2015-09-15 MED ORDER — HEPARIN SOD (PORK) LOCK FLUSH 100 UNIT/ML IV SOLN
INTRAVENOUS | Status: AC | PRN
  Administered 2015-09-15: 500 [IU]

## 2015-09-15 MED ORDER — SODIUM CHLORIDE 0.9 % IV SOLN
INTRAVENOUS | Status: DC
  Administered 2015-09-15: 12:00:00 via INTRAVENOUS

## 2015-09-15 MED ORDER — CEFAZOLIN SODIUM-DEXTROSE 2-4 GM/100ML-% IV SOLN
2.0000 g | Freq: Once | INTRAVENOUS | Status: AC
  Administered 2015-09-15: 2 g via INTRAVENOUS
  Filled 2015-09-15: qty 100

## 2015-09-15 MED ORDER — FENTANYL CITRATE (PF) 100 MCG/2ML IJ SOLN
INTRAMUSCULAR | Status: AC | PRN
  Administered 2015-09-15: 50 ug via INTRAVENOUS

## 2015-09-15 MED ORDER — LIDOCAINE HCL 1 % IJ SOLN
INTRAMUSCULAR | Status: AC
  Filled 2015-09-15: qty 20

## 2015-09-15 MED ORDER — MIDAZOLAM HCL 2 MG/2ML IJ SOLN
INTRAMUSCULAR | Status: AC | PRN
  Administered 2015-09-15: 0.5 mg via INTRAVENOUS
  Administered 2015-09-15: 1 mg via INTRAVENOUS
  Administered 2015-09-15 (×3): 0.5 mg via INTRAVENOUS

## 2015-09-15 MED ORDER — MIDAZOLAM HCL 2 MG/2ML IJ SOLN: INTRAMUSCULAR | Status: AC | PRN

## 2015-09-15 MED ORDER — HEPARIN SOD (PORK) LOCK FLUSH 100 UNIT/ML IV SOLN
INTRAVENOUS | Status: AC
  Filled 2015-09-15: qty 5

## 2015-09-15 MED ORDER — MIDAZOLAM HCL 2 MG/2ML IJ SOLN
INTRAMUSCULAR | Status: AC
  Filled 2015-09-15: qty 6

## 2015-09-15 MED ORDER — LIDOCAINE HCL 1 % IJ SOLN
INTRAMUSCULAR | Status: AC | PRN
  Administered 2015-09-15: 20 mL

## 2015-09-15 NOTE — Procedures (Signed)
Interventional Radiology Procedure Note  Procedure: Placement of a right IJ approach single lumen PowerPort.  Tip is positioned at the superior cavoatrial junction and catheter is ready for immediate use.  Complications: No immediate Recommendations:  - Ok to shower tomorrow - Do not submerge for 7 days - Routine line care   Bryant Lipps T. Zenia Guest, M.D Pager:  [phone]   

## 2015-09-15 NOTE — Discharge Instructions (Signed)
Implanted Port Home Guide °An implanted port is a type of central line that is placed under the skin. Central lines are used to provide IV access when treatment or nutrition needs to be given through a person's veins. Implanted ports are used for long-term IV access. An implanted port may be placed because:  °· You need IV medicine that would be irritating to the small veins in your hands or arms.   °· You need long-term IV medicines, such as antibiotics.   °· You need IV nutrition for a long period.   °· You need frequent blood draws for lab tests.   °· You need dialysis.   °Implanted ports are usually placed in the chest area, but they can also be placed in the upper arm, the abdomen, or the leg. An implanted port has two main parts:  °· Reservoir. The reservoir is round and will appear as a small, raised area under your skin. The reservoir is the part where a needle is inserted to give medicines or draw blood.   °· Catheter. The catheter is a thin, flexible tube that extends from the reservoir. The catheter is placed into a large vein. Medicine that is inserted into the reservoir goes into the catheter and then into the vein.   °HOW WILL I CARE FOR MY INCISION SITE? °Do not get the incision site wet. Bathe or shower as directed by your health care provider.  °HOW IS MY PORT ACCESSED? °Special steps must be taken to access the port:  °· Before the port is accessed, a numbing cream can be placed on the skin. This helps numb the skin over the port site.   °· Your health care provider uses a sterile technique to access the port. °· Your health care provider must put on a mask and sterile gloves. °· The skin over your port is cleaned carefully with an antiseptic and allowed to dry. °· The port is gently pinched between sterile gloves, and a needle is inserted into the port. °· Only "non-coring" port needles should be used to access the port. Once the port is accessed, a blood return should be checked. This helps  ensure that the port is in the vein and is not clogged.   °· If your port needs to remain accessed for a constant infusion, a clear (transparent) bandage will be placed over the needle site. The bandage and needle will need to be changed every week, or as directed by your health care provider.   °· Keep the bandage covering the needle clean and dry. Do not get it wet. Follow your health care provider's instructions on how to take a shower or bath while the port is accessed.   °· If your port does not need to stay accessed, no bandage is needed over the port.   °WHAT IS FLUSHING? °Flushing helps keep the port from getting clogged. Follow your health care provider's instructions on how and when to flush the port. Ports are usually flushed with saline solution or a medicine called heparin. The need for flushing will depend on how the port is used.  °· If the port is used for intermittent medicines or blood draws, the port will need to be flushed:   °· After medicines have been given.   °· After blood has been drawn.   °· As part of routine maintenance.   °· If a constant infusion is running, the port may not need to be flushed.   °HOW LONG WILL MY PORT STAY IMPLANTED? °The port can stay in for as long as your health care   provider thinks it is needed. When it is time for the port to come out, surgery will be done to remove it. The procedure is similar to the one performed when the port was put in.  WHEN SHOULD I SEEK IMMEDIATE MEDICAL CARE? When you have an implanted port, you should seek immediate medical care if:   You notice a bad smell coming from the incision site.   You have swelling, redness, or drainage at the incision site.   You have more swelling or pain at the port site or the surrounding area.   You have a fever that is not controlled with medicine.   This information is not intended to replace advice given to you by your health care provider. Make sure you discuss any questions you have with  your health care provider.   Document Released: 02/13/2005 Document Revised: 12/04/2012 Document Reviewed: 10/21/2012 Elsevier Interactive Patient Education 2016 [place] Insertion, Care After Refer to this sheet in the next few weeks. These instructions provide you with information on caring for yourself after your procedure. Your health care provider may also give you more specific instructions. Your treatment has been planned according to current medical practices, but problems sometimes occur. Call your health care provider if you have any problems or questions after your procedure. WHAT TO EXPECT AFTER THE PROCEDURE After your procedure, it is typical to have the following:   Discomfort at the port insertion site. Ice packs to the area will help.  Bruising on the skin over the port. This will subside in 3-4 days. HOME CARE INSTRUCTIONS  After your port is placed, you will get a manufacturer's information card. The card has information about your port. Keep this card with you at all times.   Know what kind of port you have. There are many types of ports available.   Wear a medical alert bracelet in case of an emergency. This can help alert health care workers that you have a port.   The port can stay in for as long as your health care provider believes it is necessary.   A home health care nurse may give medicines and take care of the port.   You or a family member can get special training and directions for giving medicine and taking care of the port at home.  SEEK MEDICAL CARE IF:   Your port does not flush or you are unable to get a blood return.   You have a fever or chills. SEEK IMMEDIATE MEDICAL CARE IF:  You have new fluid or pus coming from your incision.   You notice a bad smell coming from your incision site.   You have swelling, pain, or more redness at the incision or port site.   You have chest pain or shortness of breath.   This  information is not intended to replace advice given to you by your health care provider. Make sure you discuss any questions you have with your health care provider.   Document Released: 12/04/2012 Document Revised: 02/18/2013 Document Reviewed: 12/04/2012 Elsevier Interactive Patient Education 2016 [place]  May remove dressing in 24 to 48 hours and shower.  Glue or steristrips will fall off on its own.  Do not submerge in water until site is healed.  No heavy lifting for several days.  Stay hydrated with water.  Moderate Conscious Sedation, Adult Sedation is the use of medicines to promote relaxation and relieve discomfort and anxiety. Moderate conscious sedation is a  type of sedation. Under moderate conscious sedation you are less alert than normal but are still able to respond to instructions or stimulation. Moderate conscious sedation is used during short medical and dental procedures. It is milder than deep sedation or general anesthesia and allows you to return to your regular activities sooner. LET [facility] CARE PROVIDER KNOW ABOUT:   Any allergies you have.  All medicines you are taking, including vitamins, herbs, eye drops, creams, and over-the-counter medicines.  Use of steroids (by mouth or creams).  Previous problems you or members of your family have had with the use of anesthetics.  Any blood disorders you have.  Previous surgeries you have had.  Medical conditions you have.  Possibility of pregnancy, if this applies.  Use of cigarettes, alcohol, or illegal drugs. RISKS AND COMPLICATIONS Generally, this is a safe procedure. However, as with any procedure, problems can occur. Possible problems include:  Oversedation.  Trouble breathing on your own. You may need to have a breathing tube until you are awake and breathing on your own.  Allergic reaction to any of the medicines used for the procedure. BEFORE THE PROCEDURE  You may have blood tests done.  These tests can help show how well your kidneys and liver are working. They can also show how well your blood clots.  A physical exam will be done.  Only take medicines as directed by your health care provider. You may need to stop taking medicines (such as blood thinners, aspirin, or nonsteroidal anti-inflammatory drugs) before the procedure.   Do not eat or drink at least 6 hours before the procedure or as directed by your health care provider.  Arrange for a responsible adult, family member, or friend to take you home after the procedure. He or she should stay with you for at least 24 hours after the procedure, until the medicine has worn off. PROCEDURE   An intravenous (IV) catheter will be inserted into one of your veins. Medicine will be able to flow directly into your body through this catheter. You may be given medicine through this tube to help prevent pain and help you relax.  The medical or dental procedure will be done. AFTER THE PROCEDURE  You will stay in a recovery area until the medicine has worn off. Your blood pressure and pulse will be checked.   Depending on the procedure you had, you may be allowed to go home when you can tolerate liquids and your pain is under control.   This information is not intended to replace advice given to you by your health care provider. Make sure you discuss any questions you have with your health care provider.   Document Released: 11/08/2000 Document Revised: 03/06/2014 Document Reviewed: 10/21/2012 Elsevier Interactive Patient Education [employer].

## 2015-09-15 NOTE — H&P (Signed)
Chief Complaint: Patient was seen in consultation today for port placement at the request of Kefalas,Thomas S  Referring Physician(s): [facility]  Supervising Physician: Aletta Edouard  Patient Status: Outpatient  History of Present Illness: Steven Drake is a 70 y.o. male with colon cancer s/p resection. He is to begin some chemotherapy and is referred for port placement. PMHx, meds, labs, imaging, allergies reviewed. Has been off his Plavix for the past 5 days as directed. Has been NPO this am. Wife at bedside  Past Medical History  Diagnosis Date  . GERD (gastroesophageal reflux disease)   . STEMI (ST elevation myocardial infarction), 01/19/13 01/19/2013  . Hyperlipidemia LDL goal < 70 01/19/2013  . Pulmonary nodules 01/19/2013  . Hypokalemia 01/20/2013  . Tobacco abuse 01/20/2013  . Metabolic syndrome, with mildly elevated HgBA1C 01/20/2013  . Gastric adenocarcinoma ([place]) 06/29/2015  . Hypertension   . CAD (coronary artery disease) RCA non dominant vessel, LAD 40%, 80-90% 2nd diag EF 55%  01/19/2013    Dr. Claiborne Billings [place] 03-05-15 Epic.  . Gastric ulcer     many yrs ago  . Transfusion history     with gastric ulcer- many yrs ago  . Adenocarcinoma of cecum ([place]) 07/01/2015  . Adenocarcinoma of rectosigmoid junction ([place]) 08/02/2015    Past Surgical History  Procedure Laterality Date  . Appendectomy    . Left heart cath N/A 01/19/2013    Procedure: LEFT HEART CATH;  Surgeon: Troy Sine, MD;  Location: [facility] CATH LAB;  Service: Cardiovascular;  Laterality: N/A;  . Colonoscopy N/A 06/10/2015    RMR: cecal neoplasm  ? biopsied. large polyp at the hepatic flexure removed with piecmeal polypectomy and APC ablation. lesion tattooed. Large polyp in the mid sigmoid status post piecemeal hot snare debulking and tattooing. this lesion not completely removed. scatterd pancolonic diverticulsosis. no speciments collected.   . Esophagogastroduodenoscopy N/A 06/10/2015    Procedure:  ESOPHAGOGASTRODUODENOSCOPY (EGD);  Surgeon: Daneil Dolin, MD;  Location: [facility] ENDO SUITE;  Service: Endoscopy;  Laterality: N/A;  . Esophagogastroduodenoscopy N/A 06/17/2015    RMR: normal esophagus small hiatal hernia. Abnormal nodular antrum .pyloric channel status post biopsy.   Marland Kitchen Hernia repair Right   . Cataract extraction, bilateral    . Laparoscopic subtotal colectomy N/A 08/02/2015    Procedure: LAPAROSCOPIC SUBTOTAL COLECTOMY  AND DISTAL GASTRECTOMY ;  Surgeon: Stark Klein, MD;  Location: [facility] ORS;  Service: General;  Laterality: N/A;    Allergies: Contrast media  Medications: Prior to Admission medications   Medication Sig Start Date End Date Taking? Authorizing Provider  carisoprodol (SOMA) 350 MG tablet Take 1 tablet (350 mg total) by mouth 3 (three) times daily as needed for muscle spasms. 08/09/15  Yes Stark Klein, MD  clopidogrel (PLAVIX) 75 MG tablet TAKE 1 TABLET BY MOUTH DAILY WITH BREAKFAST 08/24/15  Yes Troy Sine, MD  dextrose 5 % SOLN 1,000 mL with fluorouracil 5 GM/100ML SOLN Inject into the vein. EVERY 14 DAYS. TO BEGIN 09/20/15. PUMP TO BE WORN FOR 46 HOURS.   Yes Historical Provider, MD  leucovorin in dextrose 5 % 250 mL Inject into the vein once. EVERY 14 DAYS. TO BEGIN 09/20/15   Yes Historical Provider, MD  lisinopril (PRINIVIL,ZESTRIL) 5 MG tablet TAKE 1 TABLET BY MOUTH EVERY DAY 08/24/15  Yes Fransisca Kaufmann Dettinger, MD  pantoprazole (PROTONIX) 40 MG tablet TAKE 1 TABLET BY MOUTH DAILY AT 6:00 A.M. 08/24/15  Yes Troy Sine, MD  VENTOLIN HFA 108 [phone]  Base) MCG/ACT inhaler INHALE 2 PUFFS BY MOUTH EVERY SIX HOURS AS NEEDED FOR WHEEZING OR SHORTNESS OF BREATH. 07/27/15  Yes Fransisca Kaufmann Dettinger, MD  atorvastatin (LIPITOR) 40 MG tablet TAKE 1 TABLET BY MOUTH EVERY DAY 06/28/15   Troy Sine, MD  doxycycline (ADOXA) 100 MG tablet Take 1 tablet (100 mg total) by mouth 2 (two) times daily. Patient taking differently: Take 100 mg by mouth 2 (two) times daily. Take for 7 days  starting on 07/19/15. 07/19/15   Fransisca Kaufmann Dettinger, MD  fluticasone [facility]) 50 MCG/ACT nasal spray Place 1 spray into both nostrils 2 (two) times daily as needed for allergies or rhinitis. 07/19/15   Fransisca Kaufmann Dettinger, MD  lidocaine-prilocaine (EMLA) cream Apply a quarter size amount to port site 1 hour prior to chemo. Do not rub in. Cover with plastic wrap. 09/04/15   Patrici Ranks, MD  loperamide (IMODIUM A-D) 2 MG tablet Take 1 tablet (2 mg total) by mouth 4 (four) times daily as needed for diarrhea or loose stools. 08/09/15   Stark Klein, MD  ondansetron (ZOFRAN) 8 MG tablet Take 1 tablet (8 mg total) by mouth every 8 (eight) hours as needed for nausea or vomiting. 09/04/15   Patrici Ranks, MD  OXALIPLATIN IV Inject into the vein. EVERY 14 DAYS - TO BEGIN 09/20/15    Historical Provider, MD  oxyCODONE (OXY IR/ROXICODONE) 5 MG immediate release tablet Take 1-2 tablets (5-10 mg total) by mouth every 4 (four) hours as needed for moderate pain, severe pain or breakthrough pain. 08/09/15   Stark Klein, MD  prochlorperazine (COMPAZINE) 10 MG tablet Take 1 tablet (10 mg total) by mouth every 6 (six) hours as needed for nausea or vomiting. 09/04/15   Patrici Ranks, MD     Family History  Problem Relation Age of Onset  . Cancer Father     colon  . Cancer Sister     colon    Social History   Social History  . Marital Status: Widowed    Spouse Name: N/A  . Number of Children: N/A  . Years of Education: N/A   Occupational History  . farmer    Social History Main Topics  . Smoking status: Current Every Day Smoker -- 1.00 packs/day for 58 years    Types: Cigarettes    Start date: 01/21/1963  . Smokeless tobacco: Never Used     Comment: one pack daily  . Alcohol Use: No     Comment: none in 15 yrs -heavy user-none now.  . Drug Use: No  . Sexual Activity: Not Asked   Other Topics Concern  . None   Social History Narrative     Review of Systems: A 12 point ROS discussed and  pertinent positives are indicated in the HPI above.  All other systems are negative.  Review of Systems  Vital Signs: BP 128/64 mmHg  Pulse 59  Temp(Src) 97.9 F (36.6 C) (Oral)  Resp 18  SpO2 100%  Physical Exam  Constitutional: He is oriented to person, place, and time. He appears well-developed and well-nourished. No distress.  HENT:  Head: Normocephalic.  Mouth/Throat: Oropharynx is clear and moist.  Neck: Normal range of motion. No tracheal deviation present.  Cardiovascular: Normal rate, regular rhythm and normal heart sounds.   Pulmonary/Chest: Effort normal and breath sounds normal. No respiratory distress.  Neurological: He is alert and oriented to person, place, and time.  Psychiatric: He has a normal mood and affect. Judgment  normal.    Mallampati Score:  MD Evaluation Airway: WNL Heart: WNL Abdomen: WNL Chest/ Lungs: WNL ASA  Classification: 2 Mallampati/Airway Score: One  Imaging: No results found.  Labs:  CBC:  Recent Labs  08/05/15 0359 08/06/15 0400 08/07/15 0343 09/15/15 1141  WBC 10.3 8.6 8.7 8.3  HGB 8.8* 9.3* 9.1* 10.2*  HCT 27.3* 28.5* 27.6* 31.3*  PLT 214 235 244 231    COAGS:  Recent Labs  09/15/15 1141  INR 1.02  APTT 34    BMP:  Recent Labs  08/04/15 0439 08/05/15 0359 08/06/15 0400 08/07/15 0343  NA 137 137 138 137  K 4.3 3.9 3.9 4.1  CL 105 103 103 103  CO2 27 30 28 26   GLUCOSE 137* 132* 127* 138*  BUN 16 11 11 13   CALCIUM 8.1* 8.4* 8.8* 8.7*  CREATININE 1.19 1.02 0.98 1.08  GFRNONAA 59* >60 >60 >60  GFRAA >60 >60 >60 >60    LIVER FUNCTION TESTS:  Recent Labs  04/13/15 1252  BILITOT 0.3  AST 14  ALT 8*  ALKPHOS 107  PROT 6.4  ALBUMIN 3.7    TUMOR MARKERS:  Recent Labs  06/21/15 1610  CEA 8.0*    Assessment and Plan: Colon cancer For port placement Labs ok Risks and Benefits discussed with the patient including, but not limited to bleeding, infection, pneumothorax, or fibrin sheath  development and need for additional procedures. All of the patient's questions were answered, patient is agreeable to proceed. Consent signed and in chart.    Thank you for this interesting consult.  I greatly enjoyed meeting BACILIO RHYNER and look forward to participating in their care.  A copy of this report was sent to the requesting provider on this date.  Electronically Signed: Ascencion Dike 09/15/2015, 12:58 PM   I spent a total of 20 minutes in face to face in clinical consultation, greater than 50% of which was counseling/coordinating care for port placement

## 2015-09-20 ENCOUNTER — Encounter (HOSPITAL_BASED_OUTPATIENT_CLINIC_OR_DEPARTMENT_OTHER): Payer: Commercial Managed Care - HMO

## 2015-09-20 VITALS — BP 110/52 | HR 59 | Temp 97.6°F | Resp 18 | Wt 155.2 lb

## 2015-09-20 DIAGNOSIS — Z5111 Encounter for antineoplastic chemotherapy: Secondary | ICD-10-CM | POA: Diagnosis not present

## 2015-09-20 DIAGNOSIS — C169 Malignant neoplasm of stomach, unspecified: Secondary | ICD-10-CM | POA: Diagnosis not present

## 2015-09-20 DIAGNOSIS — C180 Malignant neoplasm of cecum: Secondary | ICD-10-CM | POA: Diagnosis not present

## 2015-09-20 DIAGNOSIS — D649 Anemia, unspecified: Secondary | ICD-10-CM | POA: Diagnosis not present

## 2015-09-20 DIAGNOSIS — R918 Other nonspecific abnormal finding of lung field: Secondary | ICD-10-CM | POA: Diagnosis not present

## 2015-09-20 DIAGNOSIS — C189 Malignant neoplasm of colon, unspecified: Secondary | ICD-10-CM | POA: Diagnosis not present

## 2015-09-20 LAB — COMPREHENSIVE METABOLIC PANEL
ALBUMIN: 2.9 g/dL — AB (ref 3.5–5.0)
ALT: 9 U/L — ABNORMAL LOW (ref 17–63)
ANION GAP: 6 (ref 5–15)
AST: 15 U/L (ref 15–41)
Alkaline Phosphatase: 109 U/L (ref 38–126)
BUN: 13 mg/dL (ref 6–20)
CO2: 25 mmol/L (ref 22–32)
Calcium: 8.6 mg/dL — ABNORMAL LOW (ref 8.9–10.3)
Chloride: 108 mmol/L (ref 101–111)
Creatinine, Ser: 1.02 mg/dL (ref 0.61–1.24)
GFR calc Af Amer: >60 mL/min (ref >60)
GFR calc non Af Amer: >60 mL/min (ref >60)
GLUCOSE: 163 mg/dL — AB (ref 65–99)
POTASSIUM: 3.5 mmol/L (ref 3.5–5.1)
SODIUM: 139 mmol/L (ref 135–145)
Total Bilirubin: 0.3 mg/dL (ref 0.3–1.2)
Total Protein: 6 g/dL — ABNORMAL LOW (ref 6.5–8.1)

## 2015-09-20 LAB — CBC WITH DIFFERENTIAL/PLATELET
BASOS PCT: 0 %
Basophils Absolute: 0 10*3/uL (ref 0.0–0.1)
EOS ABS: 0.4 10*3/uL (ref 0.0–0.7)
Eosinophils Relative: 4 %
HCT: 32.1 % — ABNORMAL LOW (ref 39.0–52.0)
Hemoglobin: 10.2 g/dL — ABNORMAL LOW (ref 13.0–17.0)
Lymphocytes Relative: 21 %
Lymphs Abs: 1.8 10*3/uL (ref 0.7–4.0)
MCH: 28.9 pg (ref 26.0–34.0)
MCHC: 31.8 g/dL (ref 30.0–36.0)
MCV: 90.9 fL (ref 78.0–100.0)
MONO ABS: 0.6 10*3/uL (ref 0.1–1.0)
MONOS PCT: 7 %
NEUTROS PCT: 68 %
Neutro Abs: 5.9 10*3/uL (ref 1.7–7.7)
PLATELETS: 222 10*3/uL (ref 150–400)
RBC: 3.53 MIL/uL — ABNORMAL LOW (ref 4.22–5.81)
RDW: 17.7 % — AB (ref 11.5–15.5)
WBC: 8.7 10*3/uL (ref 4.0–10.5)

## 2015-09-20 MED ORDER — SODIUM CHLORIDE 0.9% FLUSH: 10.0000 mL | INTRAVENOUS | Status: DC | PRN

## 2015-09-20 MED ORDER — PALONOSETRON HCL INJECTION 0.25 MG/5ML
0.2500 mg | Freq: Once | INTRAVENOUS | Status: AC
Start: 2015-09-20 — End: 2015-09-20
  Administered 2015-09-20: 0.25 mg via INTRAVENOUS

## 2015-09-20 MED ORDER — FLUOROURACIL CHEMO INJECTION 2.5 GM/50ML
400.0000 mg/m2 | Freq: Once | INTRAVENOUS | Status: AC
  Administered 2015-09-20: 750 mg via INTRAVENOUS
  Filled 2015-09-20: qty 15

## 2015-09-20 MED ORDER — LEUCOVORIN CALCIUM INJECTION 350 MG
400.0000 mg/m2 | Freq: Once | INTRAVENOUS | Status: AC
  Administered 2015-09-20: 736 mg via INTRAVENOUS
  Filled 2015-09-20: qty 25

## 2015-09-20 MED ORDER — DEXAMETHASONE SODIUM PHOSPHATE 100 MG/10ML IJ SOLN
10.0000 mg | Freq: Once | INTRAMUSCULAR | Status: AC
  Administered 2015-09-20: 10 mg via INTRAVENOUS
  Filled 2015-09-20: qty 1

## 2015-09-20 MED ORDER — OXALIPLATIN CHEMO INJECTION 100 MG/20ML
85.0000 mg/m2 | Freq: Once | INTRAVENOUS | Status: AC
  Administered 2015-09-20: 155 mg via INTRAVENOUS
  Filled 2015-09-20: qty 31

## 2015-09-20 MED ORDER — DEXTROSE 5 % IV SOLN
Freq: Once | INTRAVENOUS | Status: AC
  Administered 2015-09-20: 09:00:00 via INTRAVENOUS

## 2015-09-20 MED ORDER — SODIUM CHLORIDE 0.9 % IV SOLN
2400.0000 mg/m2 | INTRAVENOUS | Status: DC
  Administered 2015-09-20: 4400 mg via INTRAVENOUS
  Filled 2015-09-20: qty 88

## 2015-09-20 NOTE — Progress Notes (Signed)
Patient tolerated infusion well.  VSS.  Consent signed for chemotherapy as well as home infusion pump.  Education provided for home infusion pump.

## 2015-09-20 NOTE — Patient Instructions (Signed)
[facility] Discharge Instructions for Patients Receiving Chemotherapy   Beginning January 23rd 2017 lab work for the Cancer Center will be done in the  Main lab at [facility] on 1st floor. If you have a lab appointment with the Cancer Center please come in thru the  Main Entrance and check in at the main information desk   Today you received the following chemotherapy agents: Oxaliplatin, leucovorin, and fluorouracil.     If you develop nausea and vomiting, or diarrhea that is not controlled by your medication, call the clinic.  The clinic phone number is [phone]. Office hours are Monday-Friday 8:30am-5:00pm.  BELOW ARE SYMPTOMS THAT SHOULD BE REPORTED IMMEDIATELY:  *FEVER GREATER THAN 101.0 F  *CHILLS WITH OR WITHOUT FEVER  NAUSEA AND VOMITING THAT IS NOT CONTROLLED WITH YOUR NAUSEA MEDICATION  *UNUSUAL SHORTNESS OF BREATH  *UNUSUAL BRUISING OR BLEEDING  TENDERNESS IN MOUTH AND THROAT WITH OR WITHOUT PRESENCE OF ULCERS  *URINARY PROBLEMS  *BOWEL PROBLEMS  UNUSUAL RASH Items with * indicate a potential emergency and should be followed up as soon as possible. If you have an emergency after office hours please contact your primary care physician or go to the nearest emergency department.  Please call the clinic during office hours if you have any questions or concerns.   You may also contact the Patient Navigator at [phone] should you have any questions or need assistance in obtaining follow up care.      Resources For Cancer Patients and their Caregivers ? American Cancer Society: Can assist with transportation, wigs, general needs, runs Look Good Feel Better.        1-888-227-6333 ? Cancer Care: Provides financial assistance, online support groups, medication/co-pay assistance.  1-800-813-HOPE (4673) ? [facility] Assists [place] cancer patients and their families through emotional , educational and  financial support.  336-427-4357 ? [place] DSS Where to apply for food stamps, Medicaid and utility assistance. 336-342-1394 ? [facility]: Transportation to medical appointments. 336-347-2287 ? Social Security Administration: May apply for disability if have a Stage IV cancer. 336-342-7796 1-800-772-1213 ? [place] Aging, Disability and Transit Services: Assists with nutrition, care and transit needs. 336-349-2343          

## 2015-09-21 ENCOUNTER — Encounter (HOSPITAL_COMMUNITY): Payer: Self-pay

## 2015-09-22 ENCOUNTER — Other Ambulatory Visit: Payer: Self-pay | Admitting: Cardiovascular Disease

## 2015-09-22 ENCOUNTER — Other Ambulatory Visit: Payer: Self-pay | Admitting: Family Medicine

## 2015-09-22 ENCOUNTER — Encounter (HOSPITAL_BASED_OUTPATIENT_CLINIC_OR_DEPARTMENT_OTHER): Payer: Commercial Managed Care - HMO

## 2015-09-22 VITALS — BP 114/55 | HR 61 | Temp 98.3°F | Resp 20

## 2015-09-22 DIAGNOSIS — C169 Malignant neoplasm of stomach, unspecified: Secondary | ICD-10-CM

## 2015-09-22 DIAGNOSIS — C180 Malignant neoplasm of cecum: Secondary | ICD-10-CM

## 2015-09-22 DIAGNOSIS — Z452 Encounter for adjustment and management of vascular access device: Secondary | ICD-10-CM | POA: Diagnosis not present

## 2015-09-22 DIAGNOSIS — N400 Enlarged prostate without lower urinary tract symptoms: Secondary | ICD-10-CM

## 2015-09-22 MED ORDER — SODIUM CHLORIDE 0.9% FLUSH
10.0000 mL | Freq: Once | INTRAVENOUS | Status: AC
  Administered 2015-09-22: 10 mL via INTRAVENOUS

## 2015-09-22 MED ORDER — HEPARIN SOD (PORK) LOCK FLUSH 100 UNIT/ML IV SOLN
500.0000 [IU] | Freq: Once | INTRAVENOUS | Status: DC | PRN
  Filled 2015-09-22: qty 5

## 2015-09-22 MED ORDER — SODIUM CHLORIDE 0.9% FLUSH: 10.0000 mL | INTRAVENOUS | Status: DC | PRN

## 2015-09-22 MED ORDER — HEPARIN SOD (PORK) LOCK FLUSH 100 UNIT/ML IV SOLN
500.0000 [IU] | Freq: Once | INTRAVENOUS | Status: AC
  Administered 2015-09-22: 500 [IU] via INTRAVENOUS

## 2015-09-22 NOTE — Progress Notes (Signed)
Pt doing good post chemo completion of 5FU. No nausea, no pain, no numbness/tingling, but does state he is burping but that is according to what he eats.

## 2015-09-23 NOTE — Telephone Encounter (Signed)
I do not see medication in patients medication list. Please advise

## 2015-09-29 ENCOUNTER — Encounter (HOSPITAL_COMMUNITY): Payer: Commercial Managed Care - HMO | Attending: Hematology & Oncology | Admitting: Oncology

## 2015-09-29 ENCOUNTER — Encounter (HOSPITAL_COMMUNITY): Payer: Commercial Managed Care - HMO

## 2015-09-29 ENCOUNTER — Encounter (HOSPITAL_COMMUNITY): Payer: Self-pay | Admitting: Oncology

## 2015-09-29 VITALS — BP 102/54 | HR 60 | Temp 97.8°F | Resp 16

## 2015-09-29 DIAGNOSIS — R918 Other nonspecific abnormal finding of lung field: Secondary | ICD-10-CM | POA: Insufficient documentation

## 2015-09-29 DIAGNOSIS — D649 Anemia, unspecified: Secondary | ICD-10-CM | POA: Diagnosis not present

## 2015-09-29 DIAGNOSIS — C19 Malignant neoplasm of rectosigmoid junction: Secondary | ICD-10-CM

## 2015-09-29 DIAGNOSIS — C169 Malignant neoplasm of stomach, unspecified: Secondary | ICD-10-CM

## 2015-09-29 DIAGNOSIS — C189 Malignant neoplasm of colon, unspecified: Secondary | ICD-10-CM | POA: Insufficient documentation

## 2015-09-29 DIAGNOSIS — E860 Dehydration: Secondary | ICD-10-CM | POA: Diagnosis not present

## 2015-09-29 DIAGNOSIS — E875 Hyperkalemia: Secondary | ICD-10-CM

## 2015-09-29 DIAGNOSIS — C180 Malignant neoplasm of cecum: Secondary | ICD-10-CM

## 2015-09-29 LAB — CBC WITH DIFFERENTIAL/PLATELET
BASOS ABS: 0 10*3/uL (ref 0.0–0.1)
Basophils Relative: 0 %
Eosinophils Absolute: 0.3 10*3/uL (ref 0.0–0.7)
Eosinophils Relative: 4 %
HEMATOCRIT: 31.9 % — AB (ref 39.0–52.0)
Hemoglobin: 10.5 g/dL — ABNORMAL LOW (ref 13.0–17.0)
LYMPHS PCT: 30 %
Lymphs Abs: 2.2 10*3/uL (ref 0.7–4.0)
MCH: 29.3 pg (ref 26.0–34.0)
MCHC: 32.9 g/dL (ref 30.0–36.0)
MCV: 89.1 fL (ref 78.0–100.0)
Monocytes Absolute: 0.6 10*3/uL (ref 0.1–1.0)
Monocytes Relative: 8 %
NEUTROS ABS: 4.4 10*3/uL (ref 1.7–7.7)
NEUTROS PCT: 58 %
Platelets: 185 10*3/uL (ref 150–400)
RBC: 3.58 MIL/uL — AB (ref 4.22–5.81)
RDW: 16.8 % — ABNORMAL HIGH (ref 11.5–15.5)
WBC: 7.5 10*3/uL (ref 4.0–10.5)

## 2015-09-29 LAB — COMPREHENSIVE METABOLIC PANEL
ALK PHOS: 92 U/L (ref 38–126)
ALT: 13 U/L — AB (ref 17–63)
ANION GAP: 6 (ref 5–15)
AST: 24 U/L (ref 15–41)
Albumin: 3.5 g/dL (ref 3.5–5.0)
BILIRUBIN TOTAL: 0.5 mg/dL (ref 0.3–1.2)
BUN: 22 mg/dL — ABNORMAL HIGH (ref 6–20)
CALCIUM: 9 mg/dL (ref 8.9–10.3)
CO2: 26 mmol/L (ref 22–32)
CREATININE: 1.61 mg/dL — AB (ref 0.61–1.24)
Chloride: 104 mmol/L (ref 101–111)
GFR, EST AFRICAN AMERICAN: 48 mL/min — AB (ref >60)
GFR, EST NON AFRICAN AMERICAN: 41 mL/min — AB (ref >60)
Glucose, Bld: 118 mg/dL — ABNORMAL HIGH (ref 65–99)
Potassium: 5.3 mmol/L — ABNORMAL HIGH (ref 3.5–5.1)
SODIUM: 136 mmol/L (ref 135–145)
TOTAL PROTEIN: 7.2 g/dL (ref 6.5–8.1)

## 2015-09-29 MED ORDER — DIPHENOXYLATE-ATROPINE 2.5-0.025 MG PO TABS: 1.0000 | ORAL_TABLET | Freq: Four times a day (QID) | ORAL | 1 refills | Status: DC | PRN

## 2015-09-29 MED ORDER — SODIUM POLYSTYRENE SULFONATE PO POWD: Freq: Once | ORAL | 0 refills | Status: AC

## 2015-09-29 NOTE — Progress Notes (Signed)
[place], [place] [address]  Gastric adenocarcinoma [facility]) - Plan: CBC with Differential, Comprehensive metabolic panel, CBC with Differential, Comprehensive metabolic panel  Adenocarcinoma of cecum (HCC)  Adenocarcinoma of rectosigmoid junction (HCC)  Dehydration - Plan: 0.9 %  sodium chloride infusion  Hyperkalemia - Plan: sodium polystyrene (KAYEXALATE) powder  CURRENT THERAPY: FOLFOX beginning on 09/20/2015  INTERVAL HISTORY: Steven Drake 70 y.o. male returns for followup of: 1. Adenocarcinoma of cecum [facility])   Staging form: Colon and Rectum, AJCC 7th Edition     Pathologic stage from 08/06/2015: Stage IIIB (T3, N1b, cM0) - Signed by Baird Cancer, PA-C on 08/20/2015 2. Adenocarcinoma of rectosigmoid junction [facility])   Staging form: Colon and Rectum, AJCC 7th Edition     Pathologic stage from 08/06/2015: Stage IIA (T3, N0, cM0) - Signed by Baird Cancer, PA-C on 08/20/2015 3. Gastric adenocarcinoma ([place])   Staging form: Stomach, AJCC 7th Edition     Pathologic stage from 08/06/2015: Stage IIIA (T2, N3a, cM0) - Signed by Baird Cancer, PA-C on 08/20/2015    Gastric adenocarcinoma ([place])   06/17/2015 Procedure    Stomach biopsy of pyloric lesion, Dr. 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     06/28/2015 PET scan    Intensely hypermetabolic cecal mass. No definitive evidence of metastatic disease. Scattered upper lobe predominant pulmonary nodules are too small for PET resolution.      08/02/2015 Procedure    Antral resection of tumor of stomach by Dr. Stark Klein     08/06/2015 Pathology Results    Invasive adenocarcinoma, poorly differentiated, 3.0 cm, extending into the muscularis propria, LVI +, 8/16 positive lymph nodes for metastatic disease, negative margins.     08/06/2015 Cancer  Staging    pT2N3A     09/15/2015 Procedure    Port placed by IR     09/20/2015 -  Chemotherapy    FOLFOX      09/29/2015 Treatment Plan Change    5 FU bolus is discontinued      Adenocarcinoma of cecum ([place])   06/10/2015 Procedure    Colonoscopy with Dr. Gala Romney, Cecal neoplasm. large polyp at hepatic flexure. lesion tattooed, large polyp in mid sigmoid (lesion not completely removed)     06/10/2015 Pathology Results    Cecal mass - adenocarcinoma, polyp at hepatic flexure foci of adenocarcinoma arising from tubulovillous adenoma     06/28/2015 PET scan    Intensely hypermetabolic cecal mass. No definitive evidence of metastatic disease. Scattered upper lobe predominant pulmonary nodules are too small for PET resolution.      08/02/2015 Procedure    Segmental resection of tumor, right, transverse, descending colon by Dr. Stark Klein     08/06/2015 Pathology Results    Invasive adenocarcinoma, 4.3 cm, well-differentiated, extending through the muscularis propria into the pericolonic soft tissue. 2/14 lymph nodes positive for metastatic disease. Surgical margins are negative.     08/06/2015 Cancer Staging    pT3N1B      Adenocarcinoma of rectosigmoid junction ([place])   06/10/2015 Procedure    Colonoscopy with Dr. Gala Romney, Cecal neoplasm. large polyp at hepatic flexure. lesion tattooed, large polyp in mid sigmoid (lesion not completely removed)     06/10/2015 Pathology Results    Polyp sigmoid - tubulovillous adenoma with at  least high grade dysplasia cannot rule out focal adenocarcinoma     06/28/2015 PET scan    Intensely hypermetabolic cecal mass. No definitive evidence of metastatic disease. Scattered upper lobe predominant pulmonary nodules are too small for PET resolution.      08/02/2015 Procedure    Segmental resection for tumor of rectosigmoid colon by Dr. Stark Klein.     08/06/2015 Pathology Results    Invasive adenocarcinoma, 2.6 cm, well-differentiated, extending through the muscularis  propria and into pericolonic soft tissue, 0/10 nodes involved, negative resection margins. No LVI.     08/06/2015 Cancer Staging    pT3N0     He tolerated treatment well.  His only complaint was diarrhea which is improving.  He notes that he was taking Imodium to help with this, but it made his diarrhea worse he reports. He took 2 doses of Imodium. He discontinued the Imodium subsequently. He reports 6-8 loose bowel movements per day beginning on Tuesday after chemotherapy. He reports that it is improving. He is a poor historian regarding this issue.  He otherwise denies any complaints including nausea and vomiting. He otherwise tolerated treatment well and remains "wide open."  Review of Systems  Constitutional: Negative.   HENT: Negative.   Eyes: Negative.   Respiratory: Negative.   Cardiovascular: Negative.   Gastrointestinal: Positive for diarrhea. Negative for blood in stool, melena, nausea and vomiting.  Genitourinary: Negative.   Musculoskeletal: Negative.   Skin: Negative.   Neurological: Negative.   Endo/Heme/Allergies: Negative.   Psychiatric/Behavioral: Negative.     Past Medical History:  Diagnosis Date  . Adenocarcinoma of cecum ([place]) 07/01/2015  . Adenocarcinoma of rectosigmoid junction ([place]) 08/02/2015  . CAD (coronary artery disease) RCA non dominant vessel, LAD 40%, 80-90% 2nd diag EF 55%  01/19/2013   Dr. Claiborne Billings [place] 03-05-15 Epic.  . Gastric adenocarcinoma ([place]) 06/29/2015  . Gastric ulcer    many yrs ago  . GERD (gastroesophageal reflux disease)   . Hyperlipidemia LDL goal < 70 01/19/2013  . Hypertension   . Hypokalemia 01/20/2013  . Metabolic syndrome, with mildly elevated HgBA1C 01/20/2013  . Pulmonary nodules 01/19/2013  . STEMI (ST elevation myocardial infarction), 01/19/13 01/19/2013  . Tobacco abuse 01/20/2013  . Transfusion history    with gastric ulcer- many yrs ago    Past Surgical History:  Procedure Laterality Date  . APPENDECTOMY    . CATARACT  EXTRACTION, BILATERAL    . COLONOSCOPY N/A 06/10/2015   RMR: cecal neoplasm  ? biopsied. large polyp at the hepatic flexure removed with piecmeal polypectomy and APC ablation. lesion tattooed. Large polyp in the mid sigmoid status post piecemeal hot snare debulking and tattooing. this lesion not completely removed. scatterd pancolonic diverticulsosis. no speciments collected.   . ESOPHAGOGASTRODUODENOSCOPY N/A 06/10/2015   Procedure: ESOPHAGOGASTRODUODENOSCOPY (EGD);  Surgeon: Daneil Dolin, MD;  Location: [facility] ENDO SUITE;  Service: Endoscopy;  Laterality: N/A;  . ESOPHAGOGASTRODUODENOSCOPY N/A 06/17/2015   RMR: normal esophagus small hiatal hernia. Abnormal nodular antrum .pyloric channel status post biopsy.   Marland Kitchen HERNIA REPAIR Right   . LAPAROSCOPIC SUBTOTAL COLECTOMY N/A 08/02/2015   Procedure: LAPAROSCOPIC SUBTOTAL COLECTOMY  AND DISTAL GASTRECTOMY ;  Surgeon: Stark Klein, MD;  Location: [facility] ORS;  Service: General;  Laterality: N/A;  . LEFT HEART CATH N/A 01/19/2013   Procedure: LEFT HEART CATH;  Surgeon: Troy Sine, MD;  Location: [facility] CATH LAB;  Service: Cardiovascular;  Laterality: N/A;    Family History  Problem Relation Age of Onset  .  Cancer Father     colon  . Cancer Sister     colon    Social History   Social History  . Marital status: Widowed    Spouse name: N/A  . Number of children: N/A  . Years of education: N/A   Occupational History  . farmer    Social History Main Topics  . Smoking status: Current Every Day Smoker    Packs/day: 1.00    Years: 58.00    Types: Cigarettes    Start date: 01/21/1963  . Smokeless tobacco: Never Used     Comment: one pack daily  . Alcohol use No     Comment: none in 15 yrs -heavy user-none now.  . Drug use: No  . Sexual activity: Not Asked   Other Topics Concern  . None   Social History Narrative  . None     PHYSICAL EXAMINATION  ECOG PERFORMANCE STATUS: 1 - Symptomatic but completely ambulatory  Vitals:   09/29/15 0818   BP: (!) 102/54  Pulse: 60  Resp: 16  Temp: 97.8 F (36.6 C)    GENERAL:alert, no distress, well nourished, well developed, comfortable, cooperative, smiling and accompanied by his sister SKIN: skin color, texture, turgor are normal, no rashes or significant lesions HEAD: Normocephalic, No masses, lesions, tenderness or abnormalities EYES: normal, EOMI, Conjunctiva are pink and non-injected EARS: External ears normal OROPHARYNX:lips, buccal mucosa, and tongue normal and mucous membranes are moist  NECK: supple, trachea midline LYMPH:  no palpable lymphadenopathy BREAST:not examined LUNGS: clear to auscultation , decreased breath sounds HEART: regular rate & rhythm, no murmurs and no gallops ABDOMEN:abdomen soft and normal bowel sounds BACK: Back symmetric, no curvature. EXTREMITIES:less then 2 second capillary refill, no joint deformities, effusion, or inflammation, no skin discoloration, no cyanosis  NEURO: alert & oriented x 3 with fluent speech, no focal motor/sensory deficits, gait normal   LABORATORY DATA: CBC    Component Value Date/Time   WBC 7.5 09/29/2015 0942   RBC 3.58 (L) 09/29/2015 0942   HGB 10.5 (L) 09/29/2015 0942   HCT 31.9 (L) 09/29/2015 0942   HCT 31.4 (L) 05/04/2015 1410   PLT 185 09/29/2015 0942   PLT 245 05/04/2015 1410   MCV 89.1 09/29/2015 0942   MCV 91 05/04/2015 1410   MCH 29.3 09/29/2015 0942   MCHC 32.9 09/29/2015 0942   RDW 16.8 (H) 09/29/2015 0942   RDW 14.2 05/04/2015 1410   LYMPHSABS 2.2 09/29/2015 0942   LYMPHSABS 2.0 05/04/2015 1410   MONOABS 0.6 09/29/2015 0942   EOSABS 0.3 09/29/2015 0942   EOSABS 0.5 (H) 05/04/2015 1410   BASOSABS 0.0 09/29/2015 0942   BASOSABS 0.0 05/04/2015 1410      Chemistry      Component Value Date/Time   NA 136 09/29/2015 0942   K 5.3 (H) 09/29/2015 0942   CL 104 09/29/2015 0942   CO2 26 09/29/2015 0942   BUN 22 (H) 09/29/2015 0942   CREATININE 1.61 (H) 09/29/2015 0942   CREATININE 1.32 (H)  04/13/2015 1252      Component Value Date/Time   CALCIUM 9.0 09/29/2015 0942   ALKPHOS 92 09/29/2015 0942   AST 24 09/29/2015 0942   ALT 13 (L) 09/29/2015 0942   BILITOT 0.5 09/29/2015 0942     Lab Results  Component Value Date   CEA 8.0 (H) 06/21/2015     PENDING LABS:   RADIOGRAPHIC STUDIES:  Ir Fluoro Guide Cv Line Right  Result Date: 09/15/2015 CLINICAL DATA:  Colon carcinoma and need for porta cath to begin chemotherapy. EXAM: IMPLANTED PORT A CATH PLACEMENT WITH ULTRASOUND AND FLUOROSCOPIC GUIDANCE ANESTHESIA/SEDATION: 3.0 Mg IV Versed; 50 mcg IV Fentanyl Total Moderate Sedation Time:  33 minutes The patient's level of consciousness and physiologic status were continuously monitored during the procedure by Radiology nursing. Additional Medications: 2 g IV Ancef. As antibiotic prophylaxis, Ancef was ordered pre-procedure and administered intravenously within one hour of incision. FLUOROSCOPY TIME:  12 seconds. PROCEDURE: The procedure, risks, benefits, and alternatives were explained to the patient. Questions regarding the procedure were encouraged and answered. The patient understands and consents to the procedure. A time-out was performed prior to initiating the procedure. Ultrasound was used to confirm patency of the right internal jugular vein. The right neck and chest were prepped with chlorhexidine in a sterile fashion, and a sterile drape was applied covering the operative field. Maximum barrier sterile technique with sterile gowns and gloves were used for the procedure. Local anesthesia was provided with 1% lidocaine. After creating a small venotomy incision, a 21 gauge needle was advanced into the right internal jugular vein under direct, real-time ultrasound guidance. Ultrasound image documentation was performed. After securing guidewire access, an 8 Fr dilator was placed. A J-wire was kinked to measure appropriate catheter length. A subcutaneous port pocket was then created  along the upper chest wall utilizing sharp and blunt dissection. Portable cautery was utilized. The pocket was irrigated with sterile saline. A single lumen power injectable port was chosen for placement. The 8 Fr catheter was tunneled from the port pocket site to the venotomy incision. The port was placed in the pocket. External catheter was trimmed to appropriate length based on guidewire measurement. At the venotomy, an 8 Fr peel-away sheath was placed over a guidewire. The catheter was then placed through the sheath and the sheath removed. Final catheter positioning was confirmed and documented with a fluoroscopic spot image. The port was accessed with a needle and aspirated and flushed with heparinized saline. The needle was removed. The venotomy and port pocket incisions were closed with subcutaneous 3-0 Monocryl and subcuticular 4-0 Vicryl. Dermabond was applied to both incisions. COMPLICATIONS: None FINDINGS: After catheter placement, the tip lies at the cavoatrial junction. The catheter aspirates normally and is ready for immediate use. IMPRESSION: Placement of single lumen port a cath via right internal jugular vein. The catheter tip lies at the cavoatrial junction. A power injectable port a cath was placed and is ready for immediate use. Electronically Signed   By: Aletta Edouard M.D.   On: 09/15/2015 15:16   Ir US Guide Vasc Access Right  Result Date: 09/15/2015 CLINICAL DATA:  Colon carcinoma and need for porta cath to begin chemotherapy. EXAM: IMPLANTED PORT A CATH PLACEMENT WITH ULTRASOUND AND FLUOROSCOPIC GUIDANCE ANESTHESIA/SEDATION: 3.0 Mg IV Versed; 50 mcg IV Fentanyl Total Moderate Sedation Time:  33 minutes The patient's level of consciousness and physiologic status were continuously monitored during the procedure by Radiology nursing. Additional Medications: 2 g IV Ancef. As antibiotic prophylaxis, Ancef was ordered pre-procedure and administered intravenously within one hour of incision.  FLUOROSCOPY TIME:  12 seconds. PROCEDURE: The procedure, risks, benefits, and alternatives were explained to the patient. Questions regarding the procedure were encouraged and answered. The patient understands and consents to the procedure. A time-out was performed prior to initiating the procedure. Ultrasound was used to confirm patency of the right internal jugular vein. The right neck and chest were prepped with chlorhexidine in a sterile fashion, and  a sterile drape was applied covering the operative field. Maximum barrier sterile technique with sterile gowns and gloves were used for the procedure. Local anesthesia was provided with 1% lidocaine. After creating a small venotomy incision, a 21 gauge needle was advanced into the right internal jugular vein under direct, real-time ultrasound guidance. Ultrasound image documentation was performed. After securing guidewire access, an 8 Fr dilator was placed. A J-wire was kinked to measure appropriate catheter length. A subcutaneous port pocket was then created along the upper chest wall utilizing sharp and blunt dissection. Portable cautery was utilized. The pocket was irrigated with sterile saline. A single lumen power injectable port was chosen for placement. The 8 Fr catheter was tunneled from the port pocket site to the venotomy incision. The port was placed in the pocket. External catheter was trimmed to appropriate length based on guidewire measurement. At the venotomy, an 8 Fr peel-away sheath was placed over a guidewire. The catheter was then placed through the sheath and the sheath removed. Final catheter positioning was confirmed and documented with a fluoroscopic spot image. The port was accessed with a needle and aspirated and flushed with heparinized saline. The needle was removed. The venotomy and port pocket incisions were closed with subcutaneous 3-0 Monocryl and subcuticular 4-0 Vicryl. Dermabond was applied to both incisions. COMPLICATIONS: None  FINDINGS: After catheter placement, the tip lies at the cavoatrial junction. The catheter aspirates normally and is ready for immediate use. IMPRESSION: Placement of single lumen port a cath via right internal jugular vein. The catheter tip lies at the cavoatrial junction. A power injectable port a cath was placed and is ready for immediate use. Electronically Signed   By: Aletta Edouard M.D.   On: 09/15/2015 15:16     PATHOLOGY:    ASSESSMENT AND PLAN:  Gastric adenocarcinoma ([place]) Stage IIIA (T2N3AM0) invasive adenocarcinoma of antrum of stomach with 8/16 lymph nodes involved with metastatic disease having undergone definitive resection by Dr. Barry Dienes on 08/02/2015.  Oncology history is updated.  Labs today: CBC diff, CMET.  I personally reviewed and went over laboratory results with the patient.  The results are noted within this dictation.  He is scheduled for genetic counseling at [facility] on 10/07/2015.  He notes some "diarrhea" that is improving.  He notes that Tuesday was the worst, moving his bowels 6-8 times per day.  He tried Imodium, but "that made it worse."  He refuses to continue this medication as a result.  He reports that his bowel movements "depend on what I eat."  Again it is improving.  Rx for Lomotil is printed today for the patient.  Discussed with Dr. Whitney Muse, we will discontinue 5FU bolus.  He denies any stomatitis, palmar-plantar erythrodysesthesia, but as noted, admits to diarrhea.  He remains "wide-open" which is his wish throughout therapy.  Return as scheduled for follow-up and cycle #2 of treatment.  Addendum: Labs returned demonstrating a hyperkalemia and a change in his renal function.  As a result, nursing will re-call him back to the clinic for 1 L of NS and Kayexalate 30 g was escribed to his pharmacy.  Adenocarcinoma of cecum ([place]) Stage IIIB (T3N1BM0) invasive adenocarcinoma of cecum, S/P resection by Dr. Barry Dienes on 08/02/2015.  His Stage  IIIB disease absolutely meets criteria for adjuvant chemotherapy with FOLFOX.  HOWEVER, his simultaneous diagnosis of adenocarcinoma of stomach is the patient's biggest mortality.   Currently undergoing adjuvant FOLFOX chemotherapy.  Adenocarcinoma of rectosigmoid junction ([facility]CC) Stage IIA (T3N0M0)  invasive adenocarcinoma of rectosigmoid colon, S/P surgical resection by Dr. Barry Dienes on 08/02/2015.   ORDERS PLACED FOR THIS ENCOUNTER: Orders Placed This Encounter  Procedures  . CBC with Differential  . Comprehensive metabolic panel    MEDICATIONS PRESCRIBED THIS ENCOUNTER: Meds ordered this encounter  Medications  . DISCONTD: loperamide (IMODIUM) 2 MG capsule    Sig: TAKE 1 TABLET BY MOUTH FOUR TIMES DAILY AS NEEDED FOR DIARRHEA OR LOOSE STOOLS    Refill:  0  . predniSONE (DELTASONE) 20 MG tablet    Sig: TAKE 2 TABLETS BY MOUTH AT THE SAME TIME EVERY DAY FOR 5 DAYS    Refill:  0  . neomycin (MYCIFRADIN) 500 MG tablet  . diphenoxylate-atropine (LOMOTIL) 2.5-0.025 MG tablet    Sig: Take 1-2 tablets by mouth 4 (four) times daily as needed for diarrhea or loose stools.    Dispense:  30 tablet    Refill:  1    Order Specific Question:   Supervising Provider    Answer:   Patrici Ranks [id number]  . sodium polystyrene (KAYEXALATE) powder    Sig: Take by mouth once. Take 30 g PO today.    Dispense:  30 g    Refill:  0    Order Specific Question:   Supervising Provider    Answer:   Patrici Ranks [id number]    THERAPY PLAN:  Continue with adjuvant FOLFOX therapy as outlined above x 12 cycles.  All questions were answered. The patient knows to call the clinic with any problems, questions or concerns. We can certainly see the patient much sooner if necessary.  Patient and plan discussed with Dr. Ancil Linsey and she is in agreement with the aforementioned.   This note is electronically signed by: Doy Mince 09/29/2015 6:06 PM

## 2015-09-29 NOTE — Assessment & Plan Note (Addendum)
Stage IIIA (T2N3AM0) invasive adenocarcinoma of antrum of stomach with 8/16 lymph nodes involved with metastatic disease having undergone definitive resection by Dr. Barry Dienes on 08/02/2015.  Oncology history is updated.  Labs today: CBC diff, CMET.  I personally reviewed and went over laboratory results with the patient.  The results are noted within this dictation.  He is scheduled for genetic counseling at [facility] on 10/07/2015.  He notes some "diarrhea" that is improving.  He notes that Tuesday was the worst, moving his bowels 6-8 times per day.  He tried Imodium, but "that made it worse."  He refuses to continue this medication as a result.  He reports that his bowel movements "depend on what I eat."  Again it is improving.  Rx for Lomotil is printed today for the patient.  Discussed with Dr. Whitney Muse, we will discontinue 5FU bolus.  He denies any stomatitis, palmar-plantar erythrodysesthesia, but as noted, admits to diarrhea.  He remains "wide-open" which is his wish throughout therapy.  Return as scheduled for follow-up and cycle #2 of treatment.  Addendum: Labs returned demonstrating a hyperkalemia and a change in his renal function.  As a result, nursing will re-call him back to the clinic for 1 L of NS and Kayexalate 30 g was escribed to his pharmacy.

## 2015-09-29 NOTE — Patient Instructions (Signed)
[place] at [facility] Discharge Instructions  RECOMMENDATIONS MADE BY THE CONSULTANT AND ANY TEST RESULTS WILL BE SENT TO YOUR REFERRING PHYSICIAN.  Exam done and seen today by Kirby Crigler Will give you a script for Lomotil today. Labs today Return to see the Doctor as shceduled. Call the clinic for any concerns or questions.   Thank you for choosing [place] at [facility] to provide your oncology and hematology care.  To afford each patient quality time with our provider, please arrive at least 15 minutes before your scheduled appointment time.   Beginning January 23rd 2017 lab work for the [facility] will be done in the  Main lab at [facility] on 1st floor. If you have a lab appointment with the [employer] please come in thru the  Main Entrance and check in at the main information desk  You need to re-schedule your appointment should you arrive 10 or more minutes late.  We strive to give you quality time with our providers, and arriving late affects you and other patients whose appointments are after yours.  Also, if you no show three or more times for appointments you may be dismissed from the clinic at the providers discretion.     Again, thank you for choosing [facility].  Our hope is that these requests will decrease the amount of time that you wait before being seen by our physicians.       _____________________________________________________________  Should you have questions after your visit to [facility], please contact our office at [phone] between the hours of 8:30 a.m. and 4:30 p.m.  Voicemails left after 4:30 p.m. will not be returned until the following business day.  For prescription refill requests, have your pharmacy contact our office.         Resources For Cancer Patients and their Caregivers ? American Cancer Society: Can assist with transportation, wigs, general needs,  runs Look Good Feel Better.        (650)534-4157 ? Cancer Care: Provides financial assistance, online support groups, medication/co-pay assistance.  1-800-813-HOPE (260)515-6977) ? [place] Assists [place] cancer patients and their families through emotional , educational and financial support.  (862)250-4691 ? [place] DSS Where to apply for food stamps, Medicaid and utility assistance. 631-718-4467 ? RCATS: Transportation to medical appointments. 206-003-7031 ? Social Security Administration: May apply for disability if have a Stage IV cancer. 954-013-9606 (303)666-8648 ? [employer], Disability and Transit Services: Assists with nutrition, care and transit needs. [place] Support Programs: @10RELATIVEDAYS @ > Cancer Support Group  2nd Tuesday of the month 1pm-2pm, Journey Room  > Creative Journey  3rd Tuesday of the month 1130am-1pm, Journey Room  > Look Good Feel Better  1st Wednesday of the month 10am-12 noon, Journey Room (Call [place] to register [phone])

## 2015-09-29 NOTE — Assessment & Plan Note (Addendum)
Stage IIIB (T3N1BM0) invasive adenocarcinoma of cecum, S/P resection by Dr. Barry Dienes on 08/02/2015.  His Stage IIIB disease absolutely meets criteria for adjuvant chemotherapy with FOLFOX.  HOWEVER, his simultaneous diagnosis of adenocarcinoma of stomach is the patient's biggest mortality.   Currently undergoing adjuvant FOLFOX chemotherapy.

## 2015-09-29 NOTE — Assessment & Plan Note (Signed)
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 Barry Dienes on 08/02/2015.

## 2015-09-30 ENCOUNTER — Encounter (HOSPITAL_COMMUNITY): Admission: RE | Admit: 2015-09-30 | Payer: Commercial Managed Care - HMO | Source: Ambulatory Visit

## 2015-10-04 ENCOUNTER — Encounter (HOSPITAL_COMMUNITY): Payer: Self-pay

## 2015-10-04 ENCOUNTER — Encounter (HOSPITAL_BASED_OUTPATIENT_CLINIC_OR_DEPARTMENT_OTHER): Payer: Commercial Managed Care - HMO

## 2015-10-04 VITALS — BP 125/60 | HR 53 | Temp 97.7°F | Resp 18 | Wt 148.4 lb

## 2015-10-04 DIAGNOSIS — D649 Anemia, unspecified: Secondary | ICD-10-CM | POA: Diagnosis not present

## 2015-10-04 DIAGNOSIS — C169 Malignant neoplasm of stomach, unspecified: Secondary | ICD-10-CM

## 2015-10-04 DIAGNOSIS — R918 Other nonspecific abnormal finding of lung field: Secondary | ICD-10-CM | POA: Diagnosis not present

## 2015-10-04 DIAGNOSIS — C189 Malignant neoplasm of colon, unspecified: Secondary | ICD-10-CM | POA: Diagnosis not present

## 2015-10-04 DIAGNOSIS — C180 Malignant neoplasm of cecum: Secondary | ICD-10-CM

## 2015-10-04 DIAGNOSIS — Z5111 Encounter for antineoplastic chemotherapy: Secondary | ICD-10-CM

## 2015-10-04 LAB — CBC WITH DIFFERENTIAL/PLATELET
Basophils Absolute: 0 10*3/uL (ref 0.0–0.1)
Basophils Relative: 0 %
EOS ABS: 0.2 10*3/uL (ref 0.0–0.7)
EOS PCT: 4 %
HCT: 31.1 % — ABNORMAL LOW (ref 39.0–52.0)
Hemoglobin: 10.3 g/dL — ABNORMAL LOW (ref 13.0–17.0)
LYMPHS ABS: 1.6 10*3/uL (ref 0.7–4.0)
Lymphocytes Relative: 28 %
MCH: 29.8 pg (ref 26.0–34.0)
MCHC: 33.1 g/dL (ref 30.0–36.0)
MCV: 89.9 fL (ref 78.0–100.0)
Monocytes Absolute: 0.7 10*3/uL (ref 0.1–1.0)
Monocytes Relative: 13 %
Neutro Abs: 3.1 10*3/uL (ref 1.7–7.7)
Neutrophils Relative %: 55 %
PLATELETS: 157 10*3/uL (ref 150–400)
RBC: 3.46 MIL/uL — AB (ref 4.22–5.81)
RDW: 15.9 % — ABNORMAL HIGH (ref 11.5–15.5)
WBC: 5.7 10*3/uL (ref 4.0–10.5)

## 2015-10-04 LAB — COMPREHENSIVE METABOLIC PANEL
ALBUMIN: 3.2 g/dL — AB (ref 3.5–5.0)
ALT: 12 U/L — ABNORMAL LOW (ref 17–63)
ANION GAP: 9 (ref 5–15)
AST: 19 U/L (ref 15–41)
Alkaline Phosphatase: 92 U/L (ref 38–126)
BUN: 16 mg/dL (ref 6–20)
CO2: 22 mmol/L (ref 22–32)
Calcium: 8.4 mg/dL — ABNORMAL LOW (ref 8.9–10.3)
Chloride: 104 mmol/L (ref 101–111)
Creatinine, Ser: 1.07 mg/dL (ref 0.61–1.24)
GFR calc non Af Amer: >60 mL/min (ref >60)
GLUCOSE: 152 mg/dL — AB (ref 65–99)
POTASSIUM: 3.6 mmol/L (ref 3.5–5.1)
SODIUM: 135 mmol/L (ref 135–145)
Total Bilirubin: 0.3 mg/dL (ref 0.3–1.2)
Total Protein: 6.5 g/dL (ref 6.5–8.1)

## 2015-10-04 MED ORDER — LEUCOVORIN CALCIUM INJECTION 350 MG
400.0000 mg/m2 | Freq: Once | INTRAVENOUS | Status: AC
  Administered 2015-10-04: 736 mg via INTRAVENOUS
  Filled 2015-10-04: qty 36.8

## 2015-10-04 MED ORDER — SODIUM CHLORIDE 0.9 % IV SOLN
2400.0000 mg/m2 | INTRAVENOUS | Status: DC
  Administered 2015-10-04: 4400 mg via INTRAVENOUS
  Filled 2015-10-04: qty 88

## 2015-10-04 MED ORDER — DEXTROSE 5 % IV SOLN
Freq: Once | INTRAVENOUS | Status: AC
  Administered 2015-10-04: 11:00:00 via INTRAVENOUS

## 2015-10-04 MED ORDER — SODIUM CHLORIDE 0.9% FLUSH: 10.0000 mL | INTRAVENOUS | Status: DC | PRN

## 2015-10-04 MED ORDER — SODIUM CHLORIDE 0.9 % IV SOLN
10.0000 mg | Freq: Once | INTRAVENOUS | Status: AC
  Administered 2015-10-04: 10 mg via INTRAVENOUS
  Filled 2015-10-04: qty 1

## 2015-10-04 MED ORDER — PALONOSETRON HCL INJECTION 0.25 MG/5ML
0.2500 mg | Freq: Once | INTRAVENOUS | Status: AC
  Administered 2015-10-04: 0.25 mg via INTRAVENOUS

## 2015-10-04 MED ORDER — DEXTROSE 5 % IV SOLN
85.0000 mg/m2 | Freq: Once | INTRAVENOUS | Status: AC
  Administered 2015-10-04: 155 mg via INTRAVENOUS
  Filled 2015-10-04: qty 31

## 2015-10-04 NOTE — Progress Notes (Signed)
Tolerated tx w/o adverse reaction.  Alert, in no distress.  VSS.  Discharged ambulatory. 

## 2015-10-04 NOTE — Patient Instructions (Signed)
[facility] Discharge Instructions for Patients Receiving Chemotherapy   Beginning January 23rd 2017 lab work for the Cancer Center will be done in the  Main lab at [place] on 1st floor. If you have a lab appointment with the Cancer Center please come in thru the  Main Entrance and check in at the main information desk   Today you received the following chemotherapy agents:  Oxaliplatin, leucovorin, and 5fu.  If you develop nausea and vomiting, or diarrhea that is not controlled by your medication, call the clinic.  The clinic phone number is [phone]. Office hours are Monday-Friday 8:30am-5:00pm.  BELOW ARE SYMPTOMS THAT SHOULD BE REPORTED IMMEDIATELY:  *FEVER GREATER THAN 101.0 F  *CHILLS WITH OR WITHOUT FEVER  NAUSEA AND VOMITING THAT IS NOT CONTROLLED WITH YOUR NAUSEA MEDICATION  *UNUSUAL SHORTNESS OF BREATH  *UNUSUAL BRUISING OR BLEEDING  TENDERNESS IN MOUTH AND THROAT WITH OR WITHOUT PRESENCE OF ULCERS  *URINARY PROBLEMS  *BOWEL PROBLEMS  UNUSUAL RASH Items with * indicate a potential emergency and should be followed up as soon as possible. If you have an emergency after office hours please contact your primary care physician or go to the nearest emergency department.  Please call the clinic during office hours if you have any questions or concerns.   You may also contact the Patient Navigator at [phone] should you have any questions or need assistance in obtaining follow up care.      Resources For Cancer Patients and their Caregivers ? American Cancer Society: Can assist with transportation, wigs, general needs, runs Look Good Feel Better.        1-888-227-6333 ? Cancer Care: Provides financial assistance, online support groups, medication/co-pay assistance.  1-800-813-HOPE (4673) ? [facility] Assists [place] cancer patients and their families through emotional , educational and financial support.   336-427-4357 ? [place] DSS Where to apply for food stamps, Medicaid and utility assistance. 336-342-1394 ? RCATS: Transportation to medical appointments. 336-347-2287 ? Social Security Administration: May apply for disability if have a Stage IV cancer. 336-342-7796 1-800-772-1213 ? [place] Aging, Disability and Transit Services: Assists with nutrition, care and transit needs. [phone]         

## 2015-10-05 NOTE — Progress Notes (Signed)
Attempted to call again. Still did not reach anybody. Was able to leave a message at the [phone] number requesting he call the cancer center for lab results.

## 2015-10-06 ENCOUNTER — Encounter (HOSPITAL_BASED_OUTPATIENT_CLINIC_OR_DEPARTMENT_OTHER): Payer: Commercial Managed Care - HMO

## 2015-10-06 DIAGNOSIS — C169 Malignant neoplasm of stomach, unspecified: Secondary | ICD-10-CM | POA: Diagnosis not present

## 2015-10-06 DIAGNOSIS — Z452 Encounter for adjustment and management of vascular access device: Secondary | ICD-10-CM | POA: Diagnosis not present

## 2015-10-06 MED ORDER — HEPARIN SOD (PORK) LOCK FLUSH 100 UNIT/ML IV SOLN
500.0000 [IU] | Freq: Once | INTRAVENOUS | Status: AC
  Administered 2015-10-06: 500 [IU] via INTRAVENOUS

## 2015-10-06 MED ORDER — SODIUM CHLORIDE 0.9% FLUSH
10.0000 mL | INTRAVENOUS | Status: DC | PRN
  Administered 2015-10-06: 10 mL via INTRAVENOUS
  Filled 2015-10-06: qty 10

## 2015-10-06 NOTE — Progress Notes (Signed)
Pump stop completed at 9AM per patient. Disconnected from pump, flush port per protocol. Discharged ambulatory, Wife at side.

## 2015-10-06 NOTE — Patient Instructions (Signed)
[place] at [facility] Discharge Instructions  RECOMMENDATIONS MADE BY THE CONSULTANT AND ANY TEST RESULTS WILL BE SENT TO YOUR REFERRING PHYSICIAN.  Pump disconnected per protocol. Port flush done per protocol. Follow up as scheduled.  Thank you for choosing [place] at [facility] to provide your oncology and hematology care.  To afford each patient quality time with our provider, please arrive at least 15 minutes before your scheduled appointment time.   Beginning January 23rd 2017 lab work for the [facility] will be done in the  Main lab at [facility] on 1st floor. If you have a lab appointment with the [place] please come in thru the  Main Entrance and check in at the main information desk  You need to re-schedule your appointment should you arrive 10 or more minutes late.  We strive to give you quality time with our providers, and arriving late affects you and other patients whose appointments are after yours.  Also, if you no show three or more times for appointments you may be dismissed from the clinic at the providers discretion.     Again, thank you for choosing [facility].  Our hope is that these requests will decrease the amount of time that you wait before being seen by our physicians.       _____________________________________________________________  Should you have questions after your visit to [facility], please contact our office at [phone] between the hours of 8:30 a.m. and 4:30 p.m.  Voicemails left after 4:30 p.m. will not be returned until the following business day.  For prescription refill requests, have your pharmacy contact our office.         Resources For Cancer Patients and their Caregivers ? American Cancer Society: Can assist with transportation, wigs, general needs, runs Look Good Feel Better.        (678)070-0927 ? Cancer Care: Provides financial  assistance, online support groups, medication/co-pay assistance.  1-800-813-HOPE 346 748 1678) ? [place] Assists [place] cancer patients and their families through emotional , educational and financial support.  (818)141-5227 ? [place] DSS Where to apply for food stamps, Medicaid and utility assistance. 856-035-9762 ? RCATS: Transportation to medical appointments. (604)468-4832 ? Social Security Administration: May apply for disability if have a Stage IV cancer. (913)321-1048 979-024-7559 ? [employer], Disability and Transit Services: Assists with nutrition, care and transit needs. [place] Support Programs: @10RELATIVEDAYS @ > Cancer Support Group  2nd Tuesday of the month 1pm-2pm, Journey Room  > Creative Journey  3rd Tuesday of the month 1130am-1pm, Journey Room  > Look Good Feel Better  1st Wednesday of the month 10am-12 noon, Journey Room (Call [place] to register [phone])

## 2015-10-07 ENCOUNTER — Encounter (HOSPITAL_BASED_OUTPATIENT_CLINIC_OR_DEPARTMENT_OTHER): Payer: Commercial Managed Care - HMO | Admitting: Genetic Counselor

## 2015-10-07 ENCOUNTER — Encounter (HOSPITAL_COMMUNITY): Payer: Self-pay | Admitting: Adult Health

## 2015-10-07 ENCOUNTER — Other Ambulatory Visit (HOSPITAL_COMMUNITY): Payer: Commercial Managed Care - HMO

## 2015-10-07 ENCOUNTER — Encounter (HOSPITAL_COMMUNITY): Payer: Self-pay | Admitting: Genetic Counselor

## 2015-10-07 ENCOUNTER — Ambulatory Visit (HOSPITAL_COMMUNITY): Payer: Commercial Managed Care - HMO | Admitting: Hematology & Oncology

## 2015-10-07 ENCOUNTER — Encounter (HOSPITAL_BASED_OUTPATIENT_CLINIC_OR_DEPARTMENT_OTHER): Payer: Commercial Managed Care - HMO | Admitting: Adult Health

## 2015-10-07 VITALS — BP 105/55 | HR 64 | Temp 98.1°F | Resp 16 | Wt 149.6 lb

## 2015-10-07 DIAGNOSIS — C169 Malignant neoplasm of stomach, unspecified: Secondary | ICD-10-CM

## 2015-10-07 DIAGNOSIS — C19 Malignant neoplasm of rectosigmoid junction: Secondary | ICD-10-CM

## 2015-10-07 DIAGNOSIS — Z800 Family history of malignant neoplasm of digestive organs: Secondary | ICD-10-CM

## 2015-10-07 DIAGNOSIS — Z86010 Personal history of colonic polyps: Secondary | ICD-10-CM | POA: Diagnosis not present

## 2015-10-07 DIAGNOSIS — Z720 Tobacco use: Secondary | ICD-10-CM

## 2015-10-07 DIAGNOSIS — C180 Malignant neoplasm of cecum: Secondary | ICD-10-CM | POA: Diagnosis not present

## 2015-10-07 NOTE — Patient Instructions (Addendum)
  [place] at [facility] Discharge Instructions  RECOMMENDATIONS MADE BY THE CONSULTANT AND ANY TEST RESULTS WILL BE SENT TO YOUR REFERRING PHYSICIAN.  Start taking the pantoprazole (Protonix) twice per day (once in the morning and once at night).  Increase your Lomotil as prescribed as well for the diarrhea as needed.   You will see Tom in 2 weeks to discuss next chemo treatment.    Thank you for choosing [place] at [facility] to provide your oncology and hematology care.  To afford each patient quality time with our provider, please arrive at least 15 minutes before your scheduled appointment time.   Beginning January 23rd 2017 lab work for the [facility] will be done in the  Main lab at [facility] on 1st floor. If you have a lab appointment with the [place] please come in thru the  Main Entrance and check in at the main information desk  You need to re-schedule your appointment should you arrive 10 or more minutes late.  We strive to give you quality time with our providers, and arriving late affects you and other patients whose appointments are after yours.  Also, if you no show three or more times for appointments you may be dismissed from the clinic at the providers discretion.     Again, thank you for choosing [facility].  Our hope is that these requests will decrease the amount of time that you wait before being seen by our physicians.       _____________________________________________________________  Should you have questions after your visit to [facility], please contact our office at [phone] between the hours of 8:30 a.m. and 4:30 p.m.  Voicemails left after 4:30 p.m. will not be returned until the following business day.  For prescription refill requests, have your pharmacy contact our office.         Resources For Cancer Patients and their Caregivers ? American Cancer  Society: Can assist with transportation, wigs, general needs, runs Look Good Feel Better.        520-785-5902 ? Cancer Care: Provides financial assistance, online support groups, medication/co-pay assistance.  1-800-813-HOPE (571) 055-3592) ? [place] Assists [place] cancer patients and their families through emotional , educational and financial support.  4147777431 ? [place] DSS Where to apply for food stamps, Medicaid and utility assistance. 508 887 5718 ? RCATS: Transportation to medical appointments. (616)405-4437 ? Social Security Administration: May apply for disability if have a Stage IV cancer. (206)177-7744 310 707 9281 ? [employer], Disability and Transit Services: Assists with nutrition, care and transit needs. [place] Support Programs: @10RELATIVEDAYS @ > Cancer Support Group  2nd Tuesday of the month 1pm-2pm, Journey Room  > Creative Journey  3rd Tuesday of the month 1130am-1pm, Journey Room  > Look Good Feel Better  1st Wednesday of the month 10am-12 noon, Journey Room (Call [place] to register [phone])

## 2015-10-07 NOTE — Progress Notes (Signed)
Steven Drake  Progress Note  Patient Care Team: Worthy Rancher, MD as PCP - General (Family Medicine) Daneil Dolin, MD as Consulting Physician (Gastroenterology)  CHIEF COMPLAINTS/PURPOSE OF CONSULTATION:    Gastric adenocarcinoma [facility])   06/17/2015 Procedure    Stomach biopsy of pyloric lesion, Dr. 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     06/28/2015 PET scan    Intensely hypermetabolic cecal mass. No definitive evidence of metastatic disease. Scattered upper lobe predominant pulmonary nodules are too small for PET resolution.      08/02/2015 Procedure    Antral resection of tumor of stomach by Dr. Stark Klein     08/06/2015 Pathology Results    Invasive adenocarcinoma, poorly differentiated, 3.0 cm, extending into the muscularis propria, LVI +, 8/16 positive lymph nodes for metastatic disease, negative margins.     08/06/2015 Cancer Staging    pT2N3A     09/15/2015 Procedure    Port placed by IR     09/20/2015 -  Chemotherapy    FOLFOX      09/29/2015 Treatment Plan Change    5 FU bolus is discontinued      Adenocarcinoma of cecum ([place])   06/10/2015 Procedure    Colonoscopy with Dr. Gala Romney, Cecal neoplasm. large polyp at hepatic flexure. lesion tattooed, large polyp in mid sigmoid (lesion not completely removed)     06/10/2015 Pathology Results    Cecal mass - adenocarcinoma, polyp at hepatic flexure foci of adenocarcinoma arising from tubulovillous adenoma     06/28/2015 PET scan    Intensely hypermetabolic cecal mass. No definitive evidence of metastatic disease. Scattered upper lobe predominant pulmonary nodules are too small for PET resolution.      08/02/2015 Procedure    Segmental resection of tumor, right, transverse, descending colon by Dr. Stark Klein     08/06/2015 Pathology Results    Invasive  adenocarcinoma, 4.3 cm, well-differentiated, extending through the muscularis propria into the pericolonic soft tissue. 2/14 lymph nodes positive for metastatic disease. Surgical margins are negative.     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 Gala Romney, Cecal neoplasm. large polyp at hepatic flexure. lesion tattooed, large polyp in mid sigmoid (lesion not completely removed)     06/10/2015 Pathology Results    Polyp sigmoid - tubulovillous adenoma with at least high grade dysplasia cannot rule out focal adenocarcinoma     06/28/2015 PET scan    Intensely hypermetabolic cecal mass. No definitive evidence of metastatic disease. Scattered upper lobe predominant pulmonary nodules are too small for PET resolution.      08/02/2015 Procedure    Segmental resection for tumor of rectosigmoid colon by Dr. Stark Klein.     08/06/2015 Pathology Results    Invasive adenocarcinoma, 2.6 cm, well-differentiated, extending through the muscularis propria and into pericolonic soft tissue, 0/10 nodes involved, negative resection margins. No LVI.     08/06/2015 Cancer Staging    pT3N0      HISTORY OF PRESENTING ILLNESS:  (from previous visit 09/29/15) [name] 70 y.o. male is here for further follow-up of T2N3 Gastric Carcinoma, T3N1B adenocarcinoma of the cecum and T3N0 adenocarcinoma of the rectosigmoid junction.  [name] returns to the [facility] today accompanied by  his one of his neighbors.  He notes that he is willing to proceed with IV chemotherapy, realistically wanting to do the most effective therapy.  He notes that he understands he will have to be cautious in the hot weather. He just wants to still be able to be outside. 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   He has a port placement scheduled tomorrow. He has attended chemotherapy class, he reports no questions.  He unfortunately  continues to smoke.    INTERVAL HISTORY (10/07/15):  [name] returns to the [facility] today accompanied by a woman.  He notes that he doesn't want chemo, saying "That thing makes you crazy."  When asked for clarification, he says it makes him crazy in "every way."  He also notes that "last night, my feet got hot," saying that it's not all the time, "just that one time last night."  He says it woke him up last night "and I was crazy and I said 'something ain't right.'" He says "it felt like something was burning on me," like they "took a hot poker to my feet." He notes that the hot feeling in his feet went away after about 2 or 3 hours, but he woke up about 4 times last night. His male companion notes "something was bothering him."  Since his feelings on chemo seemed to have changed since his last appointment, his male companion says he didn't want chemo to start with. He says "they talked me into doing it." He says he spoke to the surgeon down in [place], who said "I might ought to go ahead and take it." He says "I've been wide open, working and doing everything else," and later confirms that "it hasn't made me sick or nothing."  He says he doesn't like it when "it gets to doing me crazy," where he doesn't feel right. He says his bowels have been loose, so he has to take diarrhea pills (Lomotil). He says "some times I'll go all day long and it won't bother me, then around 6 o'clock, that's when the diarrhea really starts." He remarks that this is every day ever since the chemo treatment. He notes that the diarrhea gets better "if I take them pills." He says sometimes, when he pees, poop comes out too. He notes that this is one of his concerns. He also worries that he's "burning up his insides for nothing," commenting that he feels like "if the cancer is gonna come back, it's gonna come back."  When it comes to peeing, he says that he has "trouble getting started sometimes." He says "when  the diarrhea starts, it seems like it comes out both ends."  He notes that he has a lot of gas, and a lot of indigestion, and was advised that it's an easy fix in terms of symptom control. He notes that he's taking the diarrhea pill about 3 times a day, although it says to take it 4 times a day as needed. He notes that it does slow the diarrhea down. He says he doesn't want to harden his stools up too much.  He denies having any blood in his stools that he's seen. When asked about pain, he says "I ain't got no pain; I ain't never had pain."  He notes that his belches taste like quinine, "the chemo I guess." He notes that he got strangled on the ensure/boost, so he doesn't drink that anymore.  If we can get his diarrhea  better managed, and get his belching better, Mr. Ruszczyk notes that he will at least think about continuing the chemo.   During the physical exam, Mr. Rieker is noted to have a pack of cigarettes in his shirt pocket. He continues to smoke.   MEDICAL HISTORY:  Past Medical History:  Diagnosis Date  . Adenocarcinoma of cecum ([place]) 07/01/2015  . 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 CAD (coronary artery disease) RCA non dominant vessel, LAD 40%, 80-90% 2nd diag EF 55%  01/19/2013   Dr. Claiborne Billings Wilder 03-05-15 Epic.  . Gastric adenocarcinoma ([place]) 06/29/2015  . Gastric ulcer    many yrs ago  . GERD (gastroesophageal reflux disease)   . Hyperlipidemia LDL goal < 70 01/19/2013  . Hypertension   . Hypokalemia 01/20/2013  . Metabolic syndrome, with mildly elevated HgBA1C 01/20/2013  . Pulmonary nodules 01/19/2013  . STEMI (ST elevation myocardial infarction), 01/19/13 01/19/2013  . Tobacco abuse 01/20/2013  . Transfusion history    with gastric ulcer- many yrs ago    SURGICAL HISTORY: Past Surgical History:  Procedure Laterality Date  . APPENDECTOMY    . CATARACT EXTRACTION, BILATERAL    . COLONOSCOPY N/A 06/10/2015   RMR: cecal neoplasm  ? biopsied. large polyp  at the hepatic flexure removed with piecmeal polypectomy and APC ablation. lesion tattooed. Large polyp in the mid sigmoid status post piecemeal hot snare debulking and tattooing. this lesion not completely removed. scatterd pancolonic diverticulsosis. no speciments collected.   . ESOPHAGOGASTRODUODENOSCOPY N/A 06/10/2015   Procedure: ESOPHAGOGASTRODUODENOSCOPY (EGD);  Surgeon: Daneil Dolin, MD;  Location: [facility];  Service: Endoscopy;  Laterality: N/A;  . ESOPHAGOGASTRODUODENOSCOPY N/A 06/17/2015   RMR: normal esophagus small hiatal hernia. Abnormal nodular antrum .pyloric channel status post biopsy.   Marland Kitchen HERNIA REPAIR Right   . LAPAROSCOPIC SUBTOTAL COLECTOMY N/A 08/02/2015   Procedure: LAPAROSCOPIC SUBTOTAL COLECTOMY  AND DISTAL GASTRECTOMY ;  Surgeon: Stark Klein, MD;  Location: [facility] ORS;  Service: General;  Laterality: N/A;  . LEFT HEART CATH N/A 01/19/2013   Procedure: LEFT HEART CATH;  Surgeon: Troy Sine, MD;  Location: [facility] CATH LAB;  Service: Cardiovascular;  Laterality: N/A;    SOCIAL HISTORY: Social History   Social History  . Marital status: Widowed    Spouse name: N/A  . Number of children: N/A  . Years of education: N/A   Occupational History  . farmer    Social History Main Topics  . Smoking status: Current Every Day Smoker    Packs/day: 1.00    Years: 58.00    Types: Cigarettes    Start date: 01/21/1963  . Smokeless tobacco: Never Used     Comment: one pack daily  . Alcohol use No     Comment: none in 15 yrs -heavy user-none now.  . Drug use: No  . Sexual activity: Not on file   Other Topics Concern  . Not on file   Social History Narrative  . No narrative on file  Widowed since 2003 Has 2 step kids Still smokes No ETOH use Retired. Used to drive a truck cross country for 45 years.   FAMILY HISTORY: Family History  Problem Relation Age of Onset  . Cancer Father     colon  . Cancer Sister     colon  1 brother and 1 sister living Oldest  brother died; sister died from stomach cancer; Other sister died of colon cancer Father died of colon cancer at 47 years old. Mother  died of a heart attack in her 43s.  ALLERGIES:  is allergic to contrast media [iodinated diagnostic agents].  MEDICATIONS:  Current Outpatient Prescriptions  Medication Sig Dispense Refill  . atorvastatin (LIPITOR) 40 MG tablet Take 1 tablet (40 mg total) by mouth daily. 90 tablet 0  . carisoprodol (SOMA) 350 MG tablet Take 1 tablet (350 mg total) by mouth 3 (three) times daily as needed for muscle spasms. 30 tablet 0  . clopidogrel (PLAVIX) 75 MG tablet TAKE 1 TABLET BY MOUTH DAILY WITH BREAKFAST 30 tablet 3  . dextrose 5 % SOLN 1,000 mL with fluorouracil 5 GM/100ML SOLN Inject into the vein. EVERY 14 DAYS. TO BEGIN 09/20/15. PUMP TO BE WORN FOR 46 HOURS.    Marland Kitchen diphenoxylate-atropine (LOMOTIL) 2.5-0.025 MG tablet Take 1-2 tablets by mouth 4 (four) times daily as needed for diarrhea or loose stools. 30 tablet 1  . doxycycline (ADOXA) 100 MG tablet Take 1 tablet (100 mg total) by mouth 2 (two) times daily. 14 tablet 0  . fluticasone (FLONASE) 50 MCG/ACT nasal spray Place 1 spray into both nostrils 2 (two) times daily as needed for allergies or rhinitis. 16 g 6  . leucovorin in dextrose 5 % 250 mL Inject into the vein once. EVERY 14 DAYS. TO BEGIN 09/20/15    . lidocaine-prilocaine (EMLA) cream Apply a quarter size amount to port site 1 hour prior to chemo. Do not rub in. Cover with plastic wrap. 30 g 3  . lisinopril (PRINIVIL,ZESTRIL) 5 MG tablet TAKE 1 TABLET BY MOUTH EVERY DAY 90 tablet 0  . loperamide (IMODIUM A-D) 2 MG tablet Take 1 tablet (2 mg total) by mouth 4 (four) times daily as needed for diarrhea or loose stools. 30 tablet 0  . neomycin (MYCIFRADIN) 500 MG tablet     . ondansetron (ZOFRAN) 8 MG tablet Take 1 tablet (8 mg total) by mouth every 8 (eight) hours as needed for nausea or vomiting. 30 tablet 2  . OXALIPLATIN IV Inject into the vein. EVERY 14 DAYS  - TO BEGIN 09/20/15    . oxyCODONE (OXY IR/ROXICODONE) 5 MG immediate release tablet Take 1-2 tablets (5-10 mg total) by mouth every 4 (four) hours as needed for moderate pain, severe pain or breakthrough pain. 30 tablet 0  . pantoprazole (PROTONIX) 40 MG tablet TAKE 1 TABLET BY MOUTH DAILY AT 6:00 A.M. 30 tablet 3  . predniSONE (DELTASONE) 20 MG tablet TAKE 2 TABLETS BY MOUTH AT THE SAME TIME EVERY DAY FOR 5 DAYS  0  . prochlorperazine (COMPAZINE) 10 MG tablet Take 1 tablet (10 mg total) by mouth every 6 (six) hours as needed for nausea or vomiting. 30 tablet 2  . tamsulosin (FLOMAX) 0.4 MG CAPS capsule TAKE 1 CAPSULE BY MOUTH EVERY DAY 30 capsule 3  . VENTOLIN HFA 108 (90 Base) MCG/ACT inhaler INHALE 2 PUFFS BY MOUTH EVERY SIX HOURS AS NEEDED FOR WHEEZING OR SHORTNESS OF BREATH. 18 g 2   No current facility-administered medications for this visit.     Review of Systems  Constitutional: Negative.  Negative for chills, fever, malaise/fatigue and weight loss.  HENT: Negative.  Negative for congestion, hearing loss, nosebleeds, sore throat and tinnitus.   Eyes: Negative.  Negative for blurred vision, double vision, pain and discharge.  Respiratory: Negative.  Negative for cough, hemoptysis, sputum production, shortness of breath and wheezing.   Cardiovascular: Negative.  Negative for chest pain, palpitations, claudication, leg swelling and PND.  Gastrointestinal: Negative.  Negative for abdominal pain,  blood in stool, constipation, diarrhea, heartburn, melena, nausea and vomiting.  Genitourinary: Negative.  Negative for dysuria, frequency, hematuria and urgency.  Musculoskeletal: Negative.  Negative for falls, joint pain and myalgias.  Skin: Negative.  Negative for itching and rash.  Neurological: Negative.  Negative for dizziness, tingling, tremors, sensory change, speech change, focal weakness, seizures, loss of consciousness, weakness and headaches.  Endo/Heme/Allergies: Negative.  Does not  bruise/bleed easily.  Psychiatric/Behavioral: Negative.  Negative for depression, memory loss, substance abuse and suicidal ideas. The patient is not nervous/anxious and does not have insomnia.   All other systems reviewed and are negative.  14 point ROS was done and is otherwise as detailed above or in HPI   PHYSICAL EXAMINATION: ECOG PERFORMANCE STATUS: 1 - Symptomatic but completely ambulatory  Vitals:   10/07/15 0900  BP: (!) 105/55  Pulse: 64  Resp: 16  Temp: 98.1 F (36.7 C)   Filed Weights   10/07/15 0900  Weight: 149 lb 9.6 oz (67.9 kg)    Physical Exam  Constitutional: He is oriented to person, place, and time and well-developed, well-nourished, and in no distress.  HENT:  Head: Normocephalic and atraumatic.  Nose: Nose normal.  Mouth/Throat: Oropharynx is clear and moist. No oropharyngeal exudate.  Eyes: Conjunctivae and EOM are normal. Pupils are equal, round, and reactive to light. Right eye exhibits no discharge. Left eye exhibits no discharge. No scleral icterus.  Neck: Normal range of motion. Neck supple. No tracheal deviation present. No thyromegaly present.  Cardiovascular: Normal rate, regular rhythm and normal heart sounds.  Exam reveals no gallop and no friction rub.   No murmur heard. Pulmonary/Chest: Effort normal and breath sounds normal. He has no wheezes. He has no rales.  Abdominal: Soft. Bowel sounds are normal. He exhibits no distension and no mass. There is no tenderness. There is no rebound and no guarding.  Well healed surgical incision site  Musculoskeletal: Normal range of motion. He exhibits no edema.  Lymphadenopathy:    He has no cervical adenopathy.  Neurological: He is alert and oriented to person, place, and time. He has normal reflexes. No cranial nerve deficit. Gait normal. Coordination normal.  Skin: Skin is warm and dry. No rash noted.  Psychiatric: Mood, memory, affect and judgment normal.  Nursing note and vitals  reviewed.   LABORATORY DATA:  I have reviewed the data as listed Lab Results  Component Value Date   WBC 5.7 10/04/2015   HGB 10.3 (L) 10/04/2015   HCT 31.1 (L) 10/04/2015   MCV 89.9 10/04/2015   PLT 157 10/04/2015   CMP     Component Value Date/Time   NA 135 10/04/2015 0913   K 3.6 10/04/2015 0913   CL 104 10/04/2015 0913   CO2 22 10/04/2015 0913   GLUCOSE 152 (H) 10/04/2015 0913   BUN 16 10/04/2015 0913   CREATININE 1.07 10/04/2015 0913   CREATININE 1.32 (H) 04/13/2015 1252   CALCIUM 8.4 (L) 10/04/2015 0913   PROT 6.5 10/04/2015 0913   ALBUMIN 3.2 (L) 10/04/2015 0913   AST 19 10/04/2015 0913   ALT 12 (L) 10/04/2015 0913   ALKPHOS 92 10/04/2015 0913   BILITOT 0.3 10/04/2015 0913   GFRNONAA >60 10/04/2015 0913   GFRAA >60 10/04/2015 0913   Results for DURK, CROPSEY (MRN [id number]) as of 07/10/2015 21:03  Ref. Range 06/21/2015 16:10  Iron Latest Ref Range: 45-182 ug/dL 13 (L)  UIBC Latest Units: ug/dL 331  TIBC Latest Ref Range: 250-450 ug/dL 344  Saturation Ratios Latest Ref Range: 17.9-39.5 % 4 (L)  Ferritin Latest Ref Range: 24-336 ng/mL 13 (L)  Folate Latest Ref Range: >5.9 ng/mL 15.2  CEA Latest Ref Range: 0.0-4.7 ng/mL 8.0 (H)    RADIOGRAPHIC STUDIES: I have personally reviewed the radiological images as listed and agreed with the findings in the report. No results found.     PATHOLOGY:   ASSESSMENT & PLAN:  Adenocarcinoma of cecum ([place])   Staging form: Colon and Rectum, AJCC 7th Edition   - Pathologic stage from 08/06/2015: Stage IIIB (T3, N1b, cM0) - Signed by Baird Cancer, PA-C on 08/20/2015  Adenocarcinoma of rectosigmoid junction [facility])   Staging form: Colon and Rectum, AJCC 7th Edition   - Pathologic stage from 08/06/2015: Stage IIA (T3, N0, cM0) - Signed by Baird Cancer, PA-C on 08/20/2015  Gastric adenocarcinoma [facility])   Staging form: Stomach, AJCC 7th Edition   - Pathologic stage from 08/06/2015: Stage IIIA (T2, N3a, cM0) - Signed by Baird Cancer, PA-C on 6/23/2017Anemia Iron deficiency s/p one dose Injectafer Elevated CEA Bilateral Pulmonary nodules Tobacco Abuse  He had iron deficiency prior to surgery and was given one dose of injectafer. Counts will be rechecked next week. If his anemia remains will work up further. CEA will also be checked on D1 of therapy.  Gastric adenocarcinoma ([place]) Stage IIIA (T2N3AM0) invasive adenocarcinoma of antrum of stomach with 8/16 lymph nodes involved with metastatic disease having undergone definitive resection by Dr. Barry Dienes on 08/02/2015.  Oncology history updated.  He is scheduled for port placement tomorrow with IR.    Given his gastric carcinoma I feel FOLFOX is the treatment of choice.  I do not feel XELODA single agent would be adequate.  We had a long discussion regarding his options.  He is concerned about his ability to work during treatment.  He is educated that most patient's can work during treatment.  He is educated on good hydration during treatment and certainly being sensible in the heat. He notes that he understands.  He is scheduled for genetic counseling at [facility] on 10/07/2015.  He knows that he is to call us with any issues associated with treatment.  Return as scheduled for treatment and return as scheduled for follow-up at [facility].  Adenocarcinoma of cecum ([place]) Stage IIIB (T3N1BM0) invasive adenocarcinoma of cecum, S/P resection by Dr. Barry Dienes on 08/02/2015.  His Stage IIIB disease absolutely meets criteria for adjuvant chemotherapy with FOLFOX.  HOWEVER, his simultaneous diagnosis of adenocarcinoma of stomach is the patient's biggest mortality.  As mentioned above, the patient's case was reviewed with Dr. Benay Spice (GI Med Onc).  FOLFOX is recommended.  Adenocarcinoma of rectosigmoid junction ([facility]) Stage IIA (T3N0M0) invasive adenocarcinoma of rectosigmoid colon, S/P surgical resection by Dr. Barry Dienes on 08/02/2015.  I personally reviewed and  went over pathology results with the patient.  ASSESSMENT & PLAN (10/07/15):  -His labs are stable today since his last treatment.   -Mr. Mumper is not sure he wants to continue with any more chemotherapy.  Part of his concerns about continuing chemo seem to be related to his current symptoms of acid reflux/GI discomfort and diarrhea.  I encouraged him to increase his Protonix 40 mg BID, which may help improve his GI complaints.  I also encouraged him to take the Lomotil around the clock (4 times per day as needed) when he is having worsening diarrhea to help alleviate this as well. We discussed how some of his  bowel issues could be from the surgeries, as well as the chemotherapy.  He understands this and promises to think about whether he would like additional chemotherapy or not.  -I'm not sure his 1 event of reported, intermittent peripheral neuropathy is related to his chemotherapy, although it is possible with his current regimen.  This may also be radicular pain/referred pain, possibly from an old back injury?  I think this will be important to continue to monitor, if he elects to continue chemotherapy in the future.     -Genetic counseling is scheduled for today; he and his friend do not know why he needs genetic counseling when he doesn't have any children.  I explained the importance of at least meeting with the genetic counselor to talk about his history of multiple malignancies in consideration for his siblings, as well as his own risks of cancer.  I let him know that he could meet with the counselor and she would help him decide the risks/benefits of genetic testing to see if it may be the right decision for him or not.    -He will return to cancer center to see Kirby Crigler, PA-C on 10/18/15, as previously scheduled, for consideration of his next cycle of chemotherapy.    No orders of the defined types were placed in this encounter.  All questions were answered. The patient knows to call the  clinic with any problems, questions or concerns.  This document serves as a record of services personally performed by Mike Craze, NP. It was created on her behalf by Toni Amend, a trained medical scribe. The creation of this record is based on the scribe's personal observations and the provider's statements to them. This document has been checked and approved by the attending provider.  I have reviewed the above documentation for accuracy and completeness and I agree with the above. This note was electronically signed.    Mike Craze, NP 10/07/2015 10:50 AM

## 2015-10-07 NOTE — Progress Notes (Signed)
REFERRING PROVIDER: Worthy Rancher, MD [place], [place] [address]   Ancil Linsey, MD  PRIMARY PROVIDER:  Fransisca Kaufmann Dettinger, MD  PRIMARY REASON FOR VISIT:  1. History of colonic polyps   2. Family history of colon cancer   3. Family history of stomach cancer   4. Gastric adenocarcinoma ([place])   5. Adenocarcinoma of cecum ([place])   6. 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 male, was seen for a [facility] cancer genetics consultation at the request of Dr. Warrick Parisian due to a personal and family history of cancer.  Steven Drake presents to clinic today to discuss the possibility of a hereditary predisposition to cancer, genetic testing, and to further clarify his future cancer risks, as well as potential cancer risks for family members.   In 2017, at the age of 67, Steven Drake was diagnosed with colon and stomach cancer. This was treated with chemotherapy, but Steven Drake stated today that he has decided against continuing his treatment.     CANCER HISTORY:    Gastric adenocarcinoma ([place])   06/17/2015 Procedure    Stomach biopsy of pyloric lesion, Dr. 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     06/28/2015 PET scan    Intensely hypermetabolic cecal mass. No definitive evidence of metastatic disease. Scattered upper lobe predominant pulmonary nodules are too small for PET resolution.      08/02/2015 Procedure    Antral resection of tumor of stomach by Dr. Stark Klein     08/06/2015 Pathology Results    Invasive adenocarcinoma, poorly differentiated, 3.0 cm, extending into the muscularis propria, LVI +, 8/16 positive lymph nodes for metastatic disease, negative margins.     08/06/2015 Cancer Staging    pT2N3A     09/15/2015 Procedure    Port placed by IR      09/20/2015 -  Chemotherapy    FOLFOX      09/29/2015 Treatment Plan Change    5 FU bolus is discontinued      Adenocarcinoma of cecum ([place])   06/10/2015 Procedure    Colonoscopy with Dr. Gala Romney, Cecal neoplasm. large polyp at hepatic flexure. lesion tattooed, large polyp in mid sigmoid (lesion not completely removed)     06/10/2015 Pathology Results    Cecal mass - adenocarcinoma, polyp at hepatic flexure foci of adenocarcinoma arising from tubulovillous adenoma     06/28/2015 PET scan    Intensely hypermetabolic cecal mass. No definitive evidence of metastatic disease. Scattered upper lobe predominant pulmonary nodules are too small for PET resolution.      08/02/2015 Procedure    Segmental resection of tumor, right, transverse, descending colon by Dr. Stark Klein     08/06/2015 Pathology Results    Invasive adenocarcinoma, 4.3 cm, well-differentiated, extending through the muscularis propria into the pericolonic soft tissue. 2/14 lymph nodes positive for metastatic disease. Surgical margins are negative.     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 Gala Romney, Cecal neoplasm. large polyp at hepatic flexure. lesion tattooed, large polyp in mid sigmoid (lesion not completely removed)     06/10/2015 Pathology Results    Polyp sigmoid - tubulovillous adenoma with at  least high grade dysplasia cannot rule out focal adenocarcinoma     06/28/2015 PET scan    Intensely hypermetabolic cecal mass. No definitive evidence of metastatic disease. Scattered upper lobe predominant pulmonary nodules are too small for PET resolution.      08/02/2015 Procedure    Segmental resection for tumor of rectosigmoid colon by Dr. Stark Klein.     08/06/2015 Pathology Results    Invasive adenocarcinoma, 2.6 cm, well-differentiated, extending through the muscularis propria and into pericolonic soft tissue, 0/10 nodes involved, negative  resection margins. No LVI.     08/06/2015 Cancer Staging    pT3N0       RISK FACTORS:  Colonoscopy: yes; possibly small polyps. Any excessive radiation exposure in the past:  No Smoker: Yes, 1-1 1/2 ppd for 60 years ETOH: No MSS, no IHC loss  Past Medical History:  Diagnosis Date  . Adenocarcinoma of cecum ([place]) 07/01/2015  . 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 CAD (coronary artery disease) RCA non dominant vessel, LAD 40%, 80-90% 2nd diag EF 55%  01/19/2013   Dr. Claiborne Billings [place] 03-05-15 Epic.  . Family history of colon cancer   . Family history of stomach cancer   . Gastric adenocarcinoma ([place]) 06/29/2015  . Gastric ulcer    many yrs ago  . GERD (gastroesophageal reflux disease)   . Hyperlipidemia LDL goal < 70 01/19/2013  . Hypertension   . Hypokalemia 01/20/2013  . Metabolic syndrome, with mildly elevated HgBA1C 01/20/2013  . Pulmonary nodules 01/19/2013  . STEMI (ST elevation myocardial infarction), 01/19/13 01/19/2013  . Tobacco abuse 01/20/2013  . Transfusion history    with gastric ulcer- many yrs ago    Past Surgical History:  Procedure Laterality Date  . APPENDECTOMY    . CATARACT EXTRACTION, BILATERAL    . COLONOSCOPY N/A 06/10/2015   RMR: cecal neoplasm  ? biopsied. large polyp at the hepatic flexure removed with piecmeal polypectomy and APC ablation. lesion tattooed. Large polyp in the mid sigmoid status post piecemeal hot snare debulking and tattooing. this lesion not completely removed. scatterd pancolonic diverticulsosis. no speciments collected.   . ESOPHAGOGASTRODUODENOSCOPY N/A 06/10/2015   Procedure: ESOPHAGOGASTRODUODENOSCOPY (EGD);  Surgeon: Daneil Dolin, MD;  Location: [facility];  Service: Endoscopy;  Laterality: N/A;  . ESOPHAGOGASTRODUODENOSCOPY N/A 06/17/2015   RMR: normal esophagus small hiatal hernia. Abnormal nodular antrum .pyloric channel status post biopsy.   Marland Kitchen HERNIA REPAIR Right   . LAPAROSCOPIC SUBTOTAL COLECTOMY N/A  08/02/2015   Procedure: LAPAROSCOPIC SUBTOTAL COLECTOMY  AND DISTAL GASTRECTOMY ;  Surgeon: Stark Klein, MD;  Location: [facility] ORS;  Service: General;  Laterality: N/A;  . LEFT HEART CATH N/A 01/19/2013   Procedure: LEFT HEART CATH;  Surgeon: Troy Sine, MD;  Location: [facility] CATH LAB;  Service: Cardiovascular;  Laterality: N/A;    Social History   Social History  . Marital status: Widowed    Spouse name: N/A  . Number of children: 0  . Years of education: N/A   Occupational History  . farmer    Social History Main Topics  . Smoking status: Current Every Day Smoker    Packs/day: 1.00    Years: 58.00    Types: Cigarettes    Start date: 01/21/1963  . Smokeless tobacco: Never Used     Comment: one pack daily  . Alcohol use No     Comment: none in 15 yrs -heavy user-none now.  . Drug use: No  . Sexual activity: Not Asked  Other Topics Concern  . None   Social History Narrative  . None     FAMILY HISTORY:  We obtained a detailed, 4-generation family history.  Significant diagnoses are listed below: Family History  Problem Relation Age of Onset  . Heart attack Mother   . Colon cancer Father     dx in his 15s  . Colon cancer Sister     dx possibly in her early 19s  . Stomach cancer Sister     dx in her 49s  . Colon cancer Paternal Uncle     Steven Drake does not have children.  He had three sisters and two brothers.  One sister was daignosed with colon cancer in her early 78s and another sister was daignosed with stomach cancer in her 28s.His mother died of a heart attack and his father was diagnosed with colon cancer in his 63s.  His father had a brotehr and sister, the brother died of colon cancer.  The patinet's mother died of a heart attack.  She had several siblings, none had cancer.  There is no other reported family history of cancer.   Patient's maternal ancestors are of Caucasian descent, and paternal ancestors are of Caucasian descent. There is no reported Ashkenazi  Jewish ancestry. There is no known consanguinity.  GENETIC COUNSELING ASSESSMENT: Steven Drake is a 70 y.o. male with a personal and family history of colon and stomach cancer which is somewhat suggestive of a Lynch syndrome or other colon cancer syndrome and predisposition to cancer. We, therefore, discussed and recommended the following at today's visit.   DISCUSSION: We discussed that about 5-6% of colon cancer is due to hereditary causes, with about half of the cases due to Lynch syndrome.  Tumor testing suggest that his cancer is not Lynch related, but his family is suggestive of this condition.  About 7-10% of cases are not diagnosed by MSI/IHC testing and therefore we could look further into this for he and his family.  We discussed that there are other causes of hereditary colon cacner that we could also look at by doing a colon cancer panel.  We reviewed the characteristics, features and inheritance patterns of hereditary cancer syndromes. We also discussed genetic testing, including the appropriate family members to test, the process of testing, insurance coverage and turn-around-time for results. We discussed the implications of a negative, positive and/or variant of uncertain significant result. We recommended Steven Drake pursue genetic testing for the colon cancer gene panel and MSH2 exon inversion.  The Colorectal Cancer Panel offered by [facility] includes sequencing and/or duplication/deletion testing of the following 19 genes: APC, ATM, AXIN2, BMPR1A, CDH1, CHEK2, EPCAM, MLH1, MSH2, MSH6, MUTYH, PMS2, POLD1, POLE, PTEN, SCG5/GREM1, SMAD4, STK11, and TP53.   Based on Steven Drake personal and family history of cancer, he meets medical criteria for genetic testing. Despite that he meets criteria, he may still have an out of pocket cost. Steven Drake states that he does not have children, and he does not feel that his siblings or nieces/nephews would be interested in this information   PLAN: Despite  our recommendation, Steven Drake did not wish to pursue genetic testing at today's visit. We understand this decision, and remain available to coordinate genetic testing at any time in the future. We; therefore, recommend Steven Drake continue to follow the cancer screening guidelines given by his primary healthcare provider.  Lastly, we encouraged Steven Drake to remain in contact with cancer genetics annually so that we can  continuously update the family history and inform him of any changes in cancer genetics and testing that may be of benefit for this family.   Mr.  Drake questions were answered to his satisfaction today. Our contact information was provided should additional questions or concerns arise. Thank you for the referral and allowing [name] to share in the care of your patient.   Waynetta Metheny P. Florene Glen, [place], [facility] Certified Genetic Counselor [email or web address] phone: [phone]  The patient was seen for a total of 30 minutes in face-to-face genetic counseling.  This patient was discussed with Drs. Magrinat, Lindi Adie and/or Burr Medico who agrees with the above.    _______________________________________________________________________ For Office Staff:  Number of people involved in session: 2 Was an Intern/ student involved with case: no

## 2015-10-15 ENCOUNTER — Telehealth: Payer: Self-pay | Admitting: Family Medicine

## 2015-10-18 ENCOUNTER — Encounter (HOSPITAL_BASED_OUTPATIENT_CLINIC_OR_DEPARTMENT_OTHER): Payer: Commercial Managed Care - HMO | Admitting: Oncology

## 2015-10-18 ENCOUNTER — Encounter (HOSPITAL_COMMUNITY): Payer: Commercial Managed Care - HMO

## 2015-10-18 ENCOUNTER — Encounter (HOSPITAL_COMMUNITY): Payer: Self-pay | Admitting: Oncology

## 2015-10-18 VITALS — BP 148/65 | HR 70 | Temp 98.2°F | Resp 20 | Ht 69.0 in | Wt 149.0 lb

## 2015-10-18 DIAGNOSIS — C169 Malignant neoplasm of stomach, unspecified: Secondary | ICD-10-CM

## 2015-10-18 DIAGNOSIS — Z720 Tobacco use: Secondary | ICD-10-CM

## 2015-10-18 DIAGNOSIS — C19 Malignant neoplasm of rectosigmoid junction: Secondary | ICD-10-CM | POA: Diagnosis not present

## 2015-10-18 DIAGNOSIS — C180 Malignant neoplasm of cecum: Secondary | ICD-10-CM

## 2015-10-18 NOTE — Assessment & Plan Note (Addendum)
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 Barry Dienes on 08/02/2015.

## 2015-10-18 NOTE — Assessment & Plan Note (Addendum)
Stage IIIA (T2N3AM0) invasive adenocarcinoma of antrum of stomach with 8/16 lymph nodes involved with metastatic disease having undergone definitive resection by Dr. Barry Dienes on 08/02/2015.  Genetic testing was declined by patient, but consultation completed on 10/07/2015 with [name].  Oncology history is updated.  He is not interested in further therapy.  He is tolerating well but he is simply no longer interested in pursuing further intervention.  He is advised of the risks of discontinuing therapy in the adjuvant setting.  He is accepting of these risks.  He is agreeable to maintain his port at this time.  Port flush with labs in 6 weeks: CBC diff, CMET, CEA.  Smoking cessation education is provided.

## 2015-10-18 NOTE — Progress Notes (Signed)
[place], [place] [id number]  Gastric adenocarcinoma [facility]) - Plan: CBC with Differential, Comprehensive metabolic panel, CEA  Adenocarcinoma of cecum ([place]) - Plan: CBC with Differential, Comprehensive metabolic panel, CEA  Adenocarcinoma of rectosigmoid junction (HCC) - Plan: CBC with Differential, Comprehensive metabolic panel, CEA  CURRENT THERAPY: FOLFOX beginning on 09/20/2015  INTERVAL HISTORY: Steven Drake 70 y.o. male returns for followup of: 1. Adenocarcinoma of cecum [facility])   Staging form: Colon and Rectum, AJCC 7th Edition     Pathologic stage from 08/06/2015: Stage IIIB (T3, N1b, cM0) - Signed by Baird Cancer, PA-C on 08/20/2015 2. 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 Gastric adenocarcinoma ([place])   Staging form: Stomach, AJCC 7th Edition     Pathologic stage from 08/06/2015: Stage IIIA (T2, N3a, cM0) - Signed by Baird Cancer, PA-C on 08/20/2015    Gastric adenocarcinoma ([place])   06/17/2015 Procedure    Stomach biopsy of pyloric lesion, Dr. 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      06/28/2015 PET scan    Intensely hypermetabolic cecal mass. No definitive evidence of metastatic disease. Scattered upper lobe predominant pulmonary nodules are too small for PET resolution.       08/02/2015 Procedure    Antral resection of tumor of stomach by Dr. Stark Klein      08/06/2015 Pathology Results    Invasive adenocarcinoma, poorly differentiated, 3.0 cm, extending into the muscularis propria, LVI +, 8/16 positive lymph nodes for metastatic disease, negative margins.      08/06/2015 Cancer Staging    pT2N3A      09/15/2015 Procedure    Port placed by IR      09/20/2015 - 10/18/2015 Chemotherapy    FOLFOX x 2 cycles      09/29/2015 Treatment Plan Change    5 FU bolus is discontinued      10/07/2015 Survivorship    Genetic Counseling consultation with [name]- Despite our recommendation, Mr. Chaudoin did not wish to pursue genetic testing at today's visit. We understand this decision, and remain available to coordinate genetic testing at any time in the future. We; therefore, recommend Mr. Fors continue to follow the cancer screening guidelines given by his primary healthcare provider.      10/18/2015 Treatment Plan Change    Patient refuses future chemotherapy treatment       Adenocarcinoma of cecum ([place])   06/10/2015 Procedure    Colonoscopy with Dr. Gala Romney, Cecal neoplasm. large polyp at hepatic flexure. lesion tattooed, large polyp in mid sigmoid (lesion not completely removed)      06/10/2015 Pathology Results    Cecal mass - adenocarcinoma, polyp at hepatic flexure foci of adenocarcinoma arising from tubulovillous adenoma      06/28/2015 PET scan    Intensely hypermetabolic cecal mass. No definitive evidence of metastatic disease. Scattered upper lobe predominant pulmonary nodules are too small for PET resolution.       08/02/2015 Procedure    Segmental resection of tumor, right, transverse, descending colon by Dr. Stark Klein      08/06/2015 Pathology Results    Invasive  adenocarcinoma, 4.3 cm, well-differentiated, extending through the muscularis propria into the pericolonic soft tissue. 2/14 lymph nodes positive for metastatic disease. Surgical margins are negative.      08/06/2015 Cancer Staging    pT3N1B       Adenocarcinoma of rectosigmoid junction ([place])   06/10/2015 Procedure    Colonoscopy with Dr. Gala Romney, Cecal neoplasm. large polyp at hepatic flexure. lesion tattooed, large polyp in mid sigmoid (lesion not completely removed)      06/10/2015 Pathology Results    Polyp  sigmoid - tubulovillous adenoma with at least high grade dysplasia cannot rule out focal adenocarcinoma      06/28/2015 PET scan    Intensely hypermetabolic cecal mass. No definitive evidence of metastatic disease. Scattered upper lobe predominant pulmonary nodules are too small for PET resolution.       08/02/2015 Procedure    Segmental resection for tumor of rectosigmoid colon by Dr. Stark Klein.      08/06/2015 Pathology Results    Invasive adenocarcinoma, 2.6 cm, well-differentiated, extending through the muscularis propria and into pericolonic soft tissue, 0/10 nodes involved, negative resection margins. No LVI.      08/06/2015 Cancer Staging    pT3N0       He is tolerating treatment well.  He notes loose stools that are no worse with treatment.  This is likely secondary to surgical treatment for his cancers.  He notes issues with his left foot.  He raises his left foot and moves it all about saying "see."  He denies any pain.  On further questioning he reports that he has difficulty dorsiflexion of his toes.  He has good passive ROM.  He reports that sensation of his foot is normal.    Review of Systems  Constitutional: Negative.  Negative for chills, fever and weight loss.  HENT: Negative.   Eyes: Negative.  Negative for blurred vision.  Respiratory: Negative.  Negative for cough.   Cardiovascular: Negative.  Negative for chest pain.  Gastrointestinal: Negative.  Negative for constipation, nausea and vomiting.  Genitourinary: Negative.   Musculoskeletal: Negative.        Left toe movement abnormality  Skin: Negative.   Neurological: Negative.  Negative for weakness.  Endo/Heme/Allergies: Negative.   Psychiatric/Behavioral: Negative.     Past Medical History:  Diagnosis Date  . Adenocarcinoma of cecum ([place]) 07/01/2015  . Adenocarcinoma of rectosigmoid junction ([place]) 08/02/2015  . CAD (coronary artery disease) RCA non dominant vessel, LAD 40%, 80-90% 2nd diag EF 55%  01/19/2013    Dr. Claiborne Billings [place] 03-05-15 Epic.  . Family history of colon cancer   . Family history of stomach cancer   . Gastric adenocarcinoma ([place]) 06/29/2015  . Gastric ulcer    many yrs ago  . GERD (gastroesophageal reflux disease)   . Hyperlipidemia LDL goal < 70 01/19/2013  . Hypertension   . Hypokalemia 01/20/2013  . Metabolic syndrome, with mildly elevated HgBA1C 01/20/2013  . Pulmonary nodules 01/19/2013  . STEMI (ST elevation myocardial infarction), 01/19/13 01/19/2013  . Tobacco abuse 01/20/2013  . Transfusion history    with gastric ulcer- many yrs ago    Past Surgical History:  Procedure Laterality Date  . APPENDECTOMY    . CATARACT EXTRACTION, BILATERAL    . COLONOSCOPY N/A 06/10/2015   RMR: cecal neoplasm  ? biopsied. large polyp at the hepatic flexure removed with piecmeal polypectomy and APC ablation. lesion tattooed. Large polyp in the mid sigmoid status post piecemeal hot snare debulking and tattooing. this lesion  not completely removed. scatterd pancolonic diverticulsosis. no speciments collected.   . ESOPHAGOGASTRODUODENOSCOPY N/A 06/10/2015   Procedure: ESOPHAGOGASTRODUODENOSCOPY (EGD);  Surgeon: Daneil Dolin, MD;  Location: [facility] ENDO SUITE;  Service: Endoscopy;  Laterality: N/A;  . ESOPHAGOGASTRODUODENOSCOPY N/A 06/17/2015   RMR: normal esophagus small hiatal hernia. Abnormal nodular antrum .pyloric channel status post biopsy.   Marland Kitchen HERNIA REPAIR Right   . LAPAROSCOPIC SUBTOTAL COLECTOMY N/A 08/02/2015   Procedure: LAPAROSCOPIC SUBTOTAL COLECTOMY  AND DISTAL GASTRECTOMY ;  Surgeon: Stark Klein, MD;  Location: [facility] ORS;  Service: General;  Laterality: N/A;  . LEFT HEART CATH N/A 01/19/2013   Procedure: LEFT HEART CATH;  Surgeon: Troy Sine, MD;  Location: [facility] CATH LAB;  Service: Cardiovascular;  Laterality: N/A;    Family History  Problem Relation Age of Onset  . Heart attack Mother   . Colon cancer Father     dx in his 63s  . Colon cancer Sister     dx possibly in her early  9s  . Stomach cancer Sister     dx in her 89s  . Colon cancer Paternal Uncle     Social History   Social History  . Marital status: Widowed    Spouse name: N/A  . Number of children: 0  . Years of education: N/A   Occupational History  . farmer    Social History Main Topics  . Smoking status: Current Every Day Smoker    Packs/day: 1.00    Years: 58.00    Types: Cigarettes    Start date: 01/21/1963  . Smokeless tobacco: Never Used     Comment: one pack daily  . Alcohol use No     Comment: none in 15 yrs -heavy user-none now.  . Drug use: No  . Sexual activity: Not Asked   Other Topics Concern  . None   Social History Narrative  . None     PHYSICAL EXAMINATION  ECOG PERFORMANCE STATUS: 1 - Symptomatic but completely ambulatory  Vitals:   10/18/15 0845  BP: (!) 148/65  Pulse: 70  Resp: 20  Temp: 98.2 F (36.8 C)    GENERAL:alert, no distress, well nourished, well developed, comfortable, cooperative, smiling and accompanied by his sister SKIN: skin color, texture, turgor are normal, no rashes or significant lesions HEAD: Normocephalic, No masses, lesions, tenderness or abnormalities EYES: normal, EOMI, Conjunctiva are pink and non-injected EARS: External ears normal OROPHARYNX:lips, buccal mucosa, and tongue normal and mucous membranes are moist  NECK: supple, trachea midline LYMPH:  no palpable lymphadenopathy BREAST:not examined LUNGS: clear to auscultation , decreased breath sounds HEART: regular rate & rhythm, no murmurs and no gallops ABDOMEN:abdomen soft and normal bowel sounds BACK: Back symmetric, no curvature. EXTREMITIES:less then 2 second capillary refill, no joint deformities, effusion, or inflammation, no skin discoloration, no cyanosis  NEURO: alert & oriented x 3 with fluent speech, no focal motor/sensory deficits, gait normal   LABORATORY DATA: CBC    Component Value Date/Time   WBC 5.7 10/04/2015 0913   RBC 3.46 (L) 10/04/2015 0913    HGB 10.3 (L) 10/04/2015 0913   HCT 31.1 (L) 10/04/2015 0913   HCT 31.4 (L) 05/04/2015 1410   PLT 157 10/04/2015 0913   PLT 245 05/04/2015 1410   MCV 89.9 10/04/2015 0913   MCV 91 05/04/2015 1410   MCH 29.8 10/04/2015 0913   MCHC 33.1 10/04/2015 0913   RDW 15.9 (H) 10/04/2015 0913   RDW 14.2 05/04/2015 1410   LYMPHSABS 1.6 10/04/2015  0913   LYMPHSABS 2.0 05/04/2015 1410   MONOABS 0.7 10/04/2015 0913   EOSABS 0.2 10/04/2015 0913   EOSABS 0.5 (H) 05/04/2015 1410   BASOSABS 0.0 10/04/2015 0913   BASOSABS 0.0 05/04/2015 1410      Chemistry      Component Value Date/Time   NA 135 10/04/2015 0913   K 3.6 10/04/2015 0913   CL 104 10/04/2015 0913   CO2 22 10/04/2015 0913   BUN 16 10/04/2015 0913   CREATININE 1.07 10/04/2015 0913   CREATININE 1.32 (H) 04/13/2015 1252      Component Value Date/Time   CALCIUM 8.4 (L) 10/04/2015 0913   ALKPHOS 92 10/04/2015 0913   AST 19 10/04/2015 0913   ALT 12 (L) 10/04/2015 0913   BILITOT 0.3 10/04/2015 0913     Lab Results  Component Value Date   CEA 8.0 (H) 06/21/2015     PENDING LABS:   RADIOGRAPHIC STUDIES:  No results found.   PATHOLOGY:    ASSESSMENT AND PLAN:  Gastric adenocarcinoma ([place]) Stage IIIA (T2N3AM0) invasive adenocarcinoma of antrum of stomach with 8/16 lymph nodes involved with metastatic disease having undergone definitive resection by Dr. Barry Dienes on 08/02/2015.  Genetic testing was declined by patient, but consultation completed on 10/07/2015 with [name].  Oncology history is updated.  He is not interested in further therapy.  He is tolerating well but he is simply no longer interested in pursuing further intervention.  He is advised of the risks of discontinuing therapy in the adjuvant setting.  He is accepting of these risks.  He is agreeable to maintain his port at this time.  Port flush with labs in 6 weeks: CBC diff, CMET, CEA.  Smoking cessation education is provided.  Adenocarcinoma of  rectosigmoid junction (HCC) Stage IIA (T3N0M0) invasive adenocarcinoma of rectosigmoid colon, S/P surgical resection by Dr. Barry Dienes on 08/02/2015.  Adenocarcinoma of cecum ([place]) Stage IIIB (T3N1BM0) invasive adenocarcinoma of cecum, S/P resection by Dr. Barry Dienes on 08/02/2015.  His Stage IIIB disease absolutely meets criteria for adjuvant chemotherapy with FOLFOX.  HOWEVER, his simultaneous diagnosis of adenocarcinoma of stomach is the patient's biggest mortality.   He is discontinuing therapy today.   ORDERS PLACED FOR THIS ENCOUNTER: No orders of the defined types were placed in this encounter.   MEDICATIONS PRESCRIBED THIS ENCOUNTER: No orders of the defined types were placed in this encounter.   THERAPY PLAN:  Discontinue therapy at patient's request.  All questions were answered. The patient knows to call the clinic with any problems, questions or concerns. We can certainly see the patient much sooner if necessary.  Patient and plan discussed with Dr. Ancil Linsey and she is in agreement with the aforementioned.   This note is electronically signed by: Doy Mince 10/18/2015 10:04 AM

## 2015-10-18 NOTE — Progress Notes (Signed)
Patient chose not to get treated today r/t side effects of treatment.

## 2015-10-18 NOTE — Assessment & Plan Note (Signed)
Stage IIIB (T3N1BM0) invasive adenocarcinoma of cecum, S/P resection by Dr. Barry Dienes on 08/02/2015.  His Stage IIIB disease absolutely meets criteria for adjuvant chemotherapy with FOLFOX.  HOWEVER, his simultaneous diagnosis of adenocarcinoma of stomach is the patient's biggest mortality.   He is discontinuing therapy today.

## 2015-10-18 NOTE — Patient Instructions (Signed)
[place] at [facility] Discharge Instructions  RECOMMENDATIONS MADE BY THE CONSULTANT AND ANY TEST RESULTS WILL BE SENT TO YOUR REFERRING PHYSICIAN.  You need to have your port flushed and have labs drawn in 6 weeks, and then see Dr. Whitney Muse as well.   Thank you for choosing [place] at [facility] to provide your oncology and hematology care.  To afford each patient quality time with our provider, please arrive at least 15 minutes before your scheduled appointment time.   Beginning January 23rd 2017 lab work for the [facility] will be done in the  Main lab at [facility] on 1st floor. If you have a lab appointment with the [facility] please come in thru the  Main Entrance and check in at the main information desk  You need to re-schedule your appointment should you arrive 10 or more minutes late.  We strive to give you quality time with our providers, and arriving late affects you and other patients whose appointments are after yours.  Also, if you no show three or more times for appointments you may be dismissed from the clinic at the providers discretion.     Again, thank you for choosing [facility].  Our hope is that these requests will decrease the amount of time that you wait before being seen by our physicians.       _____________________________________________________________  Should you have questions after your visit to [facility], please contact our office at [phone] between the hours of 8:30 a.m. and 4:30 p.m.  Voicemails left after 4:30 p.m. will not be returned until the following business day.  For prescription refill requests, have your pharmacy contact our office.         Resources For Cancer Patients and their Caregivers ? American Cancer Society: Can assist with transportation, wigs, general needs, runs Look Good Feel Better.        484-754-8514 ? Cancer Care: Provides  financial assistance, online support groups, medication/co-pay assistance.  1-800-813-HOPE 304-321-0147) ? [place] Assists [place] cancer patients and their families through emotional , educational and financial support.  209-437-4162 ? [place] DSS Where to apply for food stamps, Medicaid and utility assistance. 406-667-4257 ? [facility]: Transportation to medical appointments. 563 577 0940 ? Social Security Administration: May apply for disability if have a Stage IV cancer. 270-764-5250 (782)842-5757 ? [employer], Disability and Transit Services: Assists with nutrition, care and transit needs. [place] Support Programs: @10RELATIVEDAYS @ > Cancer Support Group  2nd Tuesday of the month 1pm-2pm, Journey Room  > Creative Journey  3rd Tuesday of the month 1130am-1pm, Journey Room  > Look Good Feel Better  1st Wednesday of the month 10am-12 noon, Journey Room (Call [place] to register [phone])

## 2015-10-20 ENCOUNTER — Encounter (HOSPITAL_COMMUNITY): Payer: Commercial Managed Care - HMO

## 2015-10-21 ENCOUNTER — Other Ambulatory Visit (HOSPITAL_COMMUNITY): Payer: Self-pay | Admitting: *Deleted

## 2015-11-02 ENCOUNTER — Other Ambulatory Visit (HOSPITAL_COMMUNITY): Payer: Self-pay

## 2015-11-02 DIAGNOSIS — C19 Malignant neoplasm of rectosigmoid junction: Secondary | ICD-10-CM

## 2015-11-02 DIAGNOSIS — C169 Malignant neoplasm of stomach, unspecified: Secondary | ICD-10-CM

## 2015-11-02 DIAGNOSIS — C180 Malignant neoplasm of cecum: Secondary | ICD-10-CM

## 2015-11-02 MED ORDER — DIPHENOXYLATE-ATROPINE 2.5-0.025 MG PO TABS: 1.0000 | ORAL_TABLET | Freq: Four times a day (QID) | ORAL | 1 refills | Status: DC | PRN

## 2015-11-02 NOTE — Telephone Encounter (Signed)
Refill request received for lomotil. Chart checked and refilled per protocol.

## 2015-11-19 ENCOUNTER — Other Ambulatory Visit: Payer: Self-pay | Admitting: Family Medicine

## 2015-11-19 DIAGNOSIS — R911 Solitary pulmonary nodule: Secondary | ICD-10-CM

## 2015-11-22 ENCOUNTER — Other Ambulatory Visit: Payer: Self-pay | Admitting: Family Medicine

## 2015-11-29 ENCOUNTER — Encounter (HOSPITAL_COMMUNITY): Payer: Commercial Managed Care - HMO | Attending: Oncology | Admitting: Oncology

## 2015-11-29 ENCOUNTER — Encounter (HOSPITAL_COMMUNITY): Payer: Self-pay | Admitting: Oncology

## 2015-11-29 ENCOUNTER — Encounter (HOSPITAL_COMMUNITY): Payer: Commercial Managed Care - HMO | Attending: Hematology & Oncology

## 2015-11-29 VITALS — BP 134/63 | HR 64 | Temp 97.7°F | Resp 18 | Wt 153.8 lb

## 2015-11-29 DIAGNOSIS — C189 Malignant neoplasm of colon, unspecified: Secondary | ICD-10-CM | POA: Insufficient documentation

## 2015-11-29 DIAGNOSIS — C19 Malignant neoplasm of rectosigmoid junction: Secondary | ICD-10-CM | POA: Diagnosis not present

## 2015-11-29 DIAGNOSIS — C180 Malignant neoplasm of cecum: Secondary | ICD-10-CM

## 2015-11-29 DIAGNOSIS — R918 Other nonspecific abnormal finding of lung field: Secondary | ICD-10-CM | POA: Diagnosis not present

## 2015-11-29 DIAGNOSIS — C169 Malignant neoplasm of stomach, unspecified: Secondary | ICD-10-CM | POA: Diagnosis not present

## 2015-11-29 DIAGNOSIS — Z720 Tobacco use: Secondary | ICD-10-CM

## 2015-11-29 DIAGNOSIS — Z95828 Presence of other vascular implants and grafts: Secondary | ICD-10-CM

## 2015-11-29 DIAGNOSIS — D649 Anemia, unspecified: Secondary | ICD-10-CM | POA: Diagnosis not present

## 2015-11-29 LAB — COMPREHENSIVE METABOLIC PANEL
ALBUMIN: 3.2 g/dL — AB (ref 3.5–5.0)
ALT: 11 U/L — ABNORMAL LOW (ref 17–63)
ANION GAP: 5 (ref 5–15)
AST: 16 U/L (ref 15–41)
Alkaline Phosphatase: 103 U/L (ref 38–126)
BILIRUBIN TOTAL: 0.3 mg/dL (ref 0.3–1.2)
BUN: 10 mg/dL (ref 6–20)
CALCIUM: 8.9 mg/dL (ref 8.9–10.3)
CO2: 26 mmol/L (ref 22–32)
Chloride: 107 mmol/L (ref 101–111)
Creatinine, Ser: 0.8 mg/dL (ref 0.61–1.24)
GFR calc non Af Amer: >60 mL/min (ref >60)
GLUCOSE: 95 mg/dL (ref 65–99)
POTASSIUM: 3.9 mmol/L (ref 3.5–5.1)
SODIUM: 138 mmol/L (ref 135–145)
TOTAL PROTEIN: 6.7 g/dL (ref 6.5–8.1)

## 2015-11-29 LAB — CBC WITH DIFFERENTIAL/PLATELET
BASOS PCT: 0 %
Basophils Absolute: 0 10*3/uL (ref 0.0–0.1)
EOS ABS: 0.3 10*3/uL (ref 0.0–0.7)
Eosinophils Relative: 4 %
HCT: 33.2 % — ABNORMAL LOW (ref 39.0–52.0)
Hemoglobin: 10.5 g/dL — ABNORMAL LOW (ref 13.0–17.0)
LYMPHS ABS: 2 10*3/uL (ref 0.7–4.0)
Lymphocytes Relative: 24 %
MCH: 28.5 pg (ref 26.0–34.0)
MCHC: 31.6 g/dL (ref 30.0–36.0)
MCV: 90.2 fL (ref 78.0–100.0)
MONO ABS: 0.6 10*3/uL (ref 0.1–1.0)
MONOS PCT: 7 %
Neutro Abs: 5.4 10*3/uL (ref 1.7–7.7)
Neutrophils Relative %: 65 %
Platelets: 266 10*3/uL (ref 150–400)
RBC: 3.68 MIL/uL — ABNORMAL LOW (ref 4.22–5.81)
RDW: 14.9 % (ref 11.5–15.5)
WBC: 8.3 10*3/uL (ref 4.0–10.5)

## 2015-11-29 MED ORDER — SODIUM CHLORIDE 0.9% FLUSH
10.0000 mL | INTRAVENOUS | Status: DC | PRN
  Administered 2015-11-29: 10 mL via INTRAVENOUS
  Filled 2015-11-29: qty 10

## 2015-11-29 MED ORDER — DIPHENOXYLATE-ATROPINE 2.5-0.025 MG PO TABS: 1.0000 | ORAL_TABLET | Freq: Four times a day (QID) | ORAL | 3 refills | Status: DC | PRN

## 2015-11-29 MED ORDER — HEPARIN SOD (PORK) LOCK FLUSH 100 UNIT/ML IV SOLN
500.0000 [IU] | Freq: Once | INTRAVENOUS | Status: AC
  Administered 2015-11-29: 500 [IU] via INTRAVENOUS
  Filled 2015-11-29: qty 5

## 2015-11-29 NOTE — Assessment & Plan Note (Signed)
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 Barry Dienes on 08/02/2015.  He discontinued FOLFOX chemotherapy after 2 cycles.

## 2015-11-29 NOTE — Patient Instructions (Addendum)
[place] at [facility] Discharge Instructions  RECOMMENDATIONS MADE BY THE CONSULTANT AND ANY TEST RESULTS WILL BE SENT TO YOUR REFERRING PHYSICIAN.  You saw Steven Crigler, PA-C, today.  Labs and port flush now. Prescription for Lomotil. Port flush in 6 weeks. Follow up, port flush and labs in 12 weeks  Thank you for choosing [place] at [facility] to provide your oncology and hematology care.  To afford each patient quality time with our provider, please arrive at least 15 minutes before your scheduled appointment time.   Beginning January 23rd 2017 lab work for the [facility] will be done in the  Main lab at [facility] on 1st floor. If you have a lab appointment with the [facility] please come in thru the  Main Entrance and check in at the main information desk  You need to re-schedule your appointment should you arrive 10 or more minutes late.  We strive to give you quality time with our providers, and arriving late affects you and other patients whose appointments are after yours.  Also, if you no show three or more times for appointments you may be dismissed from the clinic at the providers discretion.     Again, thank you for choosing [facility].  Our hope is that these requests will decrease the amount of time that you wait before being seen by our physicians.       _____________________________________________________________  Should you have questions after your visit to [facility], please contact our office at [phone] between the hours of 8:30 a.m. and 4:30 p.m.  Voicemails left after 4:30 p.m. will not be returned until the following business day.  For prescription refill requests, have your pharmacy contact our office.         Resources For Cancer Patients and their Caregivers ? American Cancer Society: Can assist with transportation, wigs, general needs, runs Look Good Feel Better.         (825) 094-6751 ? Cancer Care: Provides financial assistance, online support groups, medication/co-pay assistance.  1-800-813-HOPE 548-843-6387) ? [place] Assists [place] cancer patients and their families through emotional , educational and financial support.  (916) 518-0943 ? [place] DSS Where to apply for food stamps, Medicaid and utility assistance. 629-723-7128 ? [employer]: Transportation to medical appointments. (785) 131-0782 ? Social Security Administration: May apply for disability if have a Stage IV cancer. (825) 640-4189 925-432-0568 ? [employer], Disability and Transit Services: Assists with nutrition, care and transit needs. [place] Support Programs: @10RELATIVEDAYS @ > Cancer Support Group  2nd Tuesday of the month 1pm-2pm, Journey Room  > Creative Journey  3rd Tuesday of the month 1130am-1pm, Journey Room  > Look Good Feel Better  1st Wednesday of the month 10am-12 noon, Journey Room (Call American Cancer Society to register 808-526-4301)   Diarrhea Diarrhea is frequent loose and watery bowel movements. It can cause you to feel weak and dehydrated. Dehydration can cause you to become tired and thirsty, have a dry mouth, and have decreased urination that often is dark yellow. Diarrhea is a sign of another problem, most often an infection that will not last long. In most cases, diarrhea typically lasts 2-3 days. However, it can last longer if it is a sign of something more serious. It is important to treat your diarrhea as directed by your caregiver to lessen or prevent future episodes of diarrhea. CAUSES  Some common causes  include:  Gastrointestinal infections caused by viruses, bacteria, or parasites.  Food poisoning or food allergies.  Certain medicines, such as antibiotics, chemotherapy, and laxatives.  Artificial sweeteners and fructose.  Digestive disorders. HOME CARE INSTRUCTIONS  Ensure adequate  fluid intake (hydration): Have 1 cup (8 oz) of fluid for each diarrhea episode. Avoid fluids that contain simple sugars or sports drinks, fruit juices, whole milk products, and sodas. Your urine should be clear or pale yellow if you are drinking enough fluids. Hydrate with an oral rehydration solution that you can purchase at pharmacies, retail stores, and online. You can prepare an oral rehydration solution at home by mixing the following ingredients together:   - tsp table salt.   tsp baking soda.   tsp salt substitute containing potassium chloride.  1  tablespoons sugar.  1 L (34 oz) of water.  Certain foods and beverages may increase the speed at which food moves through the gastrointestinal (GI) tract. These foods and beverages should be avoided and include:  Caffeinated and alcoholic beverages.  High-fiber foods, such as raw fruits and vegetables, nuts, seeds, and whole grain breads and cereals.  Foods and beverages sweetened with sugar alcohols, such as xylitol, sorbitol, and mannitol.  Some foods may be well tolerated and may help thicken stool including:  Starchy foods, such as rice, toast, pasta, low-sugar cereal, oatmeal, grits, baked potatoes, crackers, and bagels.  Bananas.  Applesauce.  Add probiotic-rich foods to help increase healthy bacteria in the GI tract, such as yogurt and fermented milk products.  Wash your hands well after each diarrhea episode.  Only take over-the-counter or prescription medicines as directed by your caregiver.  Take a warm bath to relieve any burning or pain from frequent diarrhea episodes. SEEK IMMEDIATE MEDICAL CARE IF:   You are unable to keep fluids down.  You have persistent vomiting.  You have blood in your stool, or your stools are black and tarry.  You do not urinate in 6-8 hours, or there is only a small amount of very dark urine.  You have abdominal pain that increases or localizes.  You have weakness, dizziness,  confusion, or light-headedness.  You have a severe headache.  Your diarrhea gets worse or does not get better.  You have a fever or persistent symptoms for more than 2-3 days.  You have a fever and your symptoms suddenly get worse. MAKE SURE YOU:   Understand these instructions.  Will watch your condition.  Will get help right away if you are not doing well or get worse.   This information is not intended to replace advice given to you by your health care provider. Make sure you discuss any questions you have with your health care provider.   Document Released: 02/03/2002 Document Revised: 03/06/2014 Document Reviewed: 10/22/2011 Elsevier Interactive Patient Education 2016 Elsevier Inc.  Atropine; Diphenoxylate tablets What is this medicine? ATROPINE; DIPHENOXYLATE (A troe peen dye fen OX i late) is used to treat diarrhea. This medicine may be used for other purposes; ask your health care provider or pharmacist if you have questions. What should I tell my health care provider before I take this medicine? They need to know if you have any of these conditions: -bacterial food poisoning -colitis -dehydration -Down's syndrome -jaundice or liver disease -an unusual or allergic reaction to atropine, diphenoxylate, other medicines, foods, dyes, or preservatives -pregnant or trying to get pregnant -breast-feeding How should I use this medicine? Take this medicine by mouth with a glass of water.  Follow the directions on the prescription label. You can take the tablets with food. Take your doses at regular intervals. Do not take your medicine more often than directed. Once your diarrhea has been brought under control your doctor or health care professional may reduce your doses. Talk to your pediatrician regarding the use of this medicine in children. Special care may be needed. Elderly patients may be more sensitive to the effects of this medicine. Overdosage: If you think you have  taken too much of this medicine contact a poison control center or emergency room at once. NOTE: This medicine is only for you. Do not share this medicine with others. What if I miss a dose? If you miss a dose, take it as soon as you can. If it is almost time for your next dose, take only that dose. Do not take double or extra doses. What may interact with this medicine? -alcohol -antihistamines for allergy, cough and cold -barbiturate medicines for inducing sleep or treating seizures -certain medicines for depression, anxiety, or psychotic disturbances -certain medicines for sleep -medicines for movement abnormalities as in Parkinson's disease, or for gastrointestinal problems -muscle relaxants -narcotic medicines (opiates) for pain This list may not describe all possible interactions. Give your health care provider a list of all the medicines, herbs, non-prescription drugs, or dietary supplements you use. Also tell them if you smoke, drink alcohol, or use illegal drugs. Some items may interact with your medicine. What should I watch for while using this medicine? If your symptoms do not start to get better after taking this medicine for two days, check with your doctor or health care professional, you may have a problem that needs further evaluation. Check with your doctor or health care professional right away if you develop a fever or bloody diarrhea. You may get drowsy or dizzy. Do not drive, use machinery, or do anything that needs mental alertness until you know how this medicine affects you. Alcohol can increase possible drowsiness and dizziness. Avoid alcoholic drinks. Your mouth may get dry. Chewing sugarless gum or sucking hard candy, and drinking plenty of water may help. Contact your doctor if the problem does not go away or is severe. Drinking plenty of water can also help prevent dehydration that can occur with diarrhea. What side effects may I notice from receiving this  medicine? Side effects that you should report to your doctor or health care professional as soon as possible: -allergic reactions like skin rash, itching or hives, swelling of the face, lips, or tongue -bloated, swollen feeling -breathing problems -changes in vision -fast, irregular heartbeat -stomach pain Side effects that usually do not require medical attention (report to your doctor or health care professional if they continue or are bothersome): -headache -loss of appetite -mood changes -nausea, vomiting -numbness or tingling in the hands and feet This list may not describe all possible side effects. Call your doctor for medical advice about side effects. You may report side effects to FDA at 1-800-FDA-1088. Where should I keep my medicine? Keep out of the reach of children. This medicine can be abused. Keep your medicine in a safe place to protect it from theft. Do not share this medicine with anyone. Selling or giving away this medicine is dangerous and against the law. This medicine may cause accidental overdose and death if taken by other adults, children, or pets. Mix any unused medicine with a substance like cat litter or coffee grounds. Then throw the medicine away in a sealed container  like a sealed bag or a coffee can with a lid. Do not use the medicine after the expiration date. Store at room temperature between 15 and 30 degrees C (59 and 86 degrees F). Protect from light. Keep container tightly closed. NOTE: This sheet is a summary. It may not cover all possible information. If you have questions about this medicine, talk to your doctor, pharmacist, or health care provider.    2016, Elsevier/Gold Standard. (2013-11-04 16:08:12)

## 2015-11-29 NOTE — Assessment & Plan Note (Addendum)
Stage IIIB (T3N1BM0) invasive adenocarcinoma of cecum, S/P resection by Dr. Barry Dienes on 08/02/2015.  His Stage IIIB disease absolutely meets criteria for adjuvant chemotherapy with FOLFOX.  HOWEVER, his simultaneous diagnosis of adenocarcinoma of stomach is the patient's biggest mortality.   He discontinued FOLFOX chemotherapy after 2 cycles.  Labs today and in 3 months: CBC diff, CMET, CEA.  At his return appointment, we can discuss timing of re-imaging and colonoscopy.

## 2015-11-29 NOTE — Progress Notes (Signed)
[name] presented for Portacath access and flush. Portacath located right chest wall accessed with  H 20 needle.  Good blood return present. Portacath flushed with 42ml NS and 500U/17ml Heparin and needle removed intact.  Procedure tolerated well and without incident.

## 2015-11-29 NOTE — Assessment & Plan Note (Signed)
Stage IIIA (T2N3AM0) invasive adenocarcinoma of antrum of stomach with 8/16 lymph nodes involved with metastatic disease having undergone definitive resection by Dr. Barry Dienes on 08/02/2015.  Genetic testing was declined by patient, but consultation completed on 10/07/2015 with [name].  He underwent 2 cycles of FOLFOX chemotherapy in the adjuvant setting (09/20/2015- 10/18/2015).  Patient decided to discontinue therapy after 2 cycles of chemotherapy.  Oncology history is updated.  He is agreeable to maintain his port at this time.  Port flush with labs today: CBC diff, CMET, CEA.  I personally reviewed and went over laboratory results with the patient.  The results are noted within this dictation.  Smoking cessation education is provided.  Labs in 3 months: CBC diff, CMET, CEA.  Return in 3 months for follow-up.

## 2015-11-29 NOTE — Progress Notes (Signed)
Steven Rancher, MD [place] [id number]  Gastric adenocarcinoma [facility]) - Plan: CBC with Differential, Comprehensive metabolic panel, diphenoxylate-atropine (LOMOTIL) 2.5-0.025 MG tablet  Adenocarcinoma of cecum (HCC) - Plan: CBC with Differential, Comprehensive metabolic panel, CEA, diphenoxylate-atropine (LOMOTIL) 2.5-0.025 MG tablet  Adenocarcinoma of rectosigmoid junction (HCC) - Plan: CBC with Differential, Comprehensive metabolic panel, CEA, diphenoxylate-atropine (LOMOTIL) 2.5-0.025 MG tablet  CURRENT THERAPY: FOLFOX beginning on 09/20/2015 x 2 cycles with patient preferring to discontinued therapy.  Last cycle administered on 10/18/2015  INTERVAL HISTORY: JAKAIDEN JABS 70 y.o. male returns for followup of: 1. Adenocarcinoma of cecum [facility])   Staging form: Colon and Rectum, AJCC 7th Edition     Pathologic stage from 08/06/2015: Stage IIIB (T3, N1b, cM0) - Signed by Baird Cancer, PA-C on 08/20/2015 2. 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 Gastric adenocarcinoma ([place])   Staging form: Stomach, AJCC 7th Edition     Pathologic stage from 08/06/2015: Stage IIIA (T2, N3a, cM0) - Signed by Baird Cancer, PA-C on 08/20/2015    Gastric adenocarcinoma ([place])   06/17/2015 Procedure    Stomach biopsy of pyloric lesion, Dr. 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      06/28/2015 PET scan    Intensely hypermetabolic cecal mass. No definitive evidence of metastatic disease. Scattered upper lobe predominant pulmonary nodules are too small for PET resolution.       08/02/2015 Procedure    Antral resection of tumor of stomach by Dr. Stark Klein      08/06/2015 Pathology Results    Invasive adenocarcinoma, poorly differentiated, 3.0 cm, extending into the muscularis propria, LVI +, 8/16 positive lymph nodes for metastatic disease, negative margins.      08/06/2015 Cancer Staging    pT2N3A      09/15/2015 Procedure    Port placed by IR      09/20/2015 - 10/18/2015 Chemotherapy    FOLFOX x 2 cycles      09/29/2015 Treatment Plan Change    5 FU bolus is discontinued      10/07/2015 Survivorship    Genetic Counseling consultation with [name]- Despite our recommendation, Mr. Dankers did not wish to pursue genetic testing at today's visit. We understand this decision, and remain available to coordinate genetic testing at any time in the future. We; therefore, recommend Mr. Raimer continue to follow the cancer screening guidelines given by his primary healthcare provider.      10/18/2015 Treatment Plan Change    Patient refuses future chemotherapy treatment       Adenocarcinoma of cecum ([place])   06/10/2015 Procedure    Colonoscopy with Dr. Gala Romney, Cecal neoplasm. large polyp at hepatic flexure. lesion tattooed, large polyp in mid sigmoid (lesion not completely removed)      06/10/2015 Pathology Results    Cecal mass - adenocarcinoma, polyp at hepatic flexure foci of adenocarcinoma arising from tubulovillous adenoma      06/28/2015 PET scan    Intensely hypermetabolic cecal mass. No definitive evidence of metastatic disease. Scattered upper lobe predominant pulmonary nodules are too small for PET resolution.  08/02/2015 Procedure    Segmental resection of tumor, right, transverse, descending colon by Dr. Stark Klein      08/06/2015 Pathology Results    Invasive adenocarcinoma, 4.3 cm, well-differentiated, extending through the muscularis propria into the pericolonic soft tissue. 2/14 lymph nodes positive for metastatic disease. Surgical margins are negative.      08/06/2015 Cancer Staging    pT3N1B       Adenocarcinoma of  rectosigmoid junction ([place])   06/10/2015 Procedure    Colonoscopy with Dr. Gala Romney, Cecal neoplasm. large polyp at hepatic flexure. lesion tattooed, large polyp in mid sigmoid (lesion not completely removed)      06/10/2015 Pathology Results    Polyp sigmoid - tubulovillous adenoma with at least high grade dysplasia cannot rule out focal adenocarcinoma      06/28/2015 PET scan    Intensely hypermetabolic cecal mass. No definitive evidence of metastatic disease. Scattered upper lobe predominant pulmonary nodules are too small for PET resolution.       08/02/2015 Procedure    Segmental resection for tumor of rectosigmoid colon by Dr. Stark Klein.      08/06/2015 Pathology Results    Invasive adenocarcinoma, 2.6 cm, well-differentiated, extending through the muscularis propria and into pericolonic soft tissue, 0/10 nodes involved, negative resection margins. No LVI.      08/06/2015 Cancer Staging    pT3N0      He reports that he is doing well.  He continues to have episodes of loose stools that require him to "squat when I piss."  He reports that Lomotil is effective for him.  He does note a weak urine stream and he is advised to follow-up with his primary care provider regarding this issue.  He notes that his appetite is strong.  His weight is stable to slightly up a few lbs.    "I'm wide open."  Review of Systems  Constitutional: Negative.  Negative for chills, fever and weight loss.  HENT: Negative.   Eyes: Negative.   Respiratory: Negative.  Negative for cough.   Cardiovascular: Negative.  Negative for chest pain.  Gastrointestinal: Negative.  Negative for abdominal pain, blood in stool, constipation, diarrhea, melena, nausea and vomiting.  Genitourinary: Negative.  Negative for dysuria.  Musculoskeletal: Negative.   Skin: Negative.  Negative for rash.  Neurological: Negative.  Negative for weakness.  Endo/Heme/Allergies: Negative.   Psychiatric/Behavioral: Negative.     Past  Medical History:  Diagnosis Date  . Adenocarcinoma of cecum ([place]) 07/01/2015  . Adenocarcinoma of rectosigmoid junction ([place]) 08/02/2015  . CAD (coronary artery disease) RCA non dominant vessel, LAD 40%, 80-90% 2nd diag EF 55%  01/19/2013   Dr. Claiborne Billings Boaz 03-05-15 Epic.  . Family history of colon cancer   . Family history of stomach cancer   . Gastric adenocarcinoma ([place]) 06/29/2015  . Gastric ulcer    many yrs ago  . GERD (gastroesophageal reflux disease)   . Hyperlipidemia LDL goal < 70 01/19/2013  . Hypertension   . Hypokalemia 01/20/2013  . Metabolic syndrome, with mildly elevated HgBA1C 01/20/2013  . Pulmonary nodules 01/19/2013  . STEMI (ST elevation myocardial infarction), 01/19/13 01/19/2013  . Tobacco abuse 01/20/2013  . Transfusion history    with gastric ulcer- many yrs ago    Past Surgical History:  Procedure Laterality Date  . APPENDECTOMY    . CATARACT EXTRACTION, BILATERAL    . COLONOSCOPY N/A 06/10/2015   RMR: cecal neoplasm  ? biopsied. large polyp at the hepatic flexure removed with piecmeal  polypectomy and APC ablation. lesion tattooed. Large polyp in the mid sigmoid status post piecemeal hot snare debulking and tattooing. this lesion not completely removed. scatterd pancolonic diverticulsosis. no speciments collected.   . ESOPHAGOGASTRODUODENOSCOPY N/A 06/10/2015   Procedure: ESOPHAGOGASTRODUODENOSCOPY (EGD);  Surgeon: Daneil Dolin, MD;  Location: [facility] END[facility] SUITE;  Service: Endoscopy;  Laterality: N/A;  . ESOPHAGOGASTRODUODENOSCOPY N/A 06/17/2015   RMR: normal esophagus small hiatal hernia. Abnormal nodular antrum .pyloric channel status post biopsy.   Marland Kitchen HERNIA REPAIR Right   . LAPAROSCOPIC SUBTOTAL COLECTOMY N/A 08/02/2015   Procedure: LAPAROSCOPIC SUBTOTAL COLECTOMY  AND DISTAL GASTRECTOMY ;  Surgeon: Stark Klein, MD;  Location: [facility] ORS;  Service: General;  Laterality: N/A;  . LEFT HEART CATH N/A 01/19/2013   Procedure: LEFT HEART CATH;  Surgeon: Troy Sine, MD;   Location: [facility] CATH LAB;  Service: Cardiovascular;  Laterality: N/A;    Family History  Problem Relation Age of Onset  . Heart attack Mother   . Colon cancer Father     dx in his 49s  . Colon cancer Sister     dx possibly in her early 52s  . Stomach cancer Sister     dx in her 30s  . Colon cancer Paternal Uncle     Social History   Social History  . Marital status: Widowed    Spouse name: N/A  . Number of children: 0  . Years of education: N/A   Occupational History  . farmer    Social History Main Topics  . Smoking status: Current Every Day Smoker    Packs/day: 1.00    Years: 58.00    Types: Cigarettes    Start date: 01/21/1963  . Smokeless tobacco: Never Used     Comment: one pack daily  . Alcohol use No     Comment: none in 15 yrs -heavy user-none now.  . Drug use: No  . Sexual activity: Not Asked   Other Topics Concern  . None   Social History Narrative  . None     PHYSICAL EXAMINATION  ECOG PERFORMANCE STATUS: 1 - Symptomatic but completely ambulatory  Vitals:   11/29/15 1103  BP: 134/63  Pulse: 64  Resp: 18  Temp: 97.7 F (36.5 C)    GENERAL:alert, no distress, well nourished, well developed, comfortable, cooperative, smiling and accompanied by his sister SKIN: skin color, texture, turgor are normal, no rashes or significant lesions HEAD: Normocephalic, No masses, lesions, tenderness or abnormalities EYES: normal, EOMI, Conjunctiva are pink and non-injected EARS: External ears normal OROPHARYNX:lips, buccal mucosa, and tongue normal and mucous membranes are moist  NECK: supple, trachea midline LYMPH:  no palpable lymphadenopathy BREAST:not examined LUNGS: clear to auscultation, decreased breath sounds bilaterally. HEART: regular rate & rhythm, no murmurs and no gallops ABDOMEN:abdomen soft and normal bowel sounds BACK: Back symmetric, no curvature. EXTREMITIES:less then 2 second capillary refill, no joint deformities, effusion, or  inflammation, no skin discoloration, no cyanosis  NEURO: alert & oriented x 3 with fluent speech, no focal motor/sensory deficits, gait normal   LABORATORY DATA: CBC    Component Value Date/Time   WBC 5.7 10/04/2015 0913   RBC 3.46 (L) 10/04/2015 0913   HGB 10.3 (L) 10/04/2015 0913   HCT 31.1 (L) 10/04/2015 0913   HCT 31.4 (L) 05/04/2015 1410   PLT 157 10/04/2015 0913   PLT 245 05/04/2015 1410   MCV 89.9 10/04/2015 0913   MCV 91 05/04/2015 1410   MCH 29.8 10/04/2015 0913   MCHC  33.1 10/04/2015 0913   RDW 15.9 (H) 10/04/2015 0913   RDW 14.2 05/04/2015 1410   LYMPHSABS 1.6 10/04/2015 0913   LYMPHSABS 2.0 05/04/2015 1410   MONOABS 0.7 10/04/2015 0913   EOSABS 0.2 10/04/2015 0913   EOSABS 0.5 (H) 05/04/2015 1410   BASOSABS 0.0 10/04/2015 0913   BASOSABS 0.0 05/04/2015 1410      Chemistry      Component Value Date/Time   NA 135 10/04/2015 0913   K 3.6 10/04/2015 0913   CL 104 10/04/2015 0913   CO2 22 10/04/2015 0913   BUN 16 10/04/2015 0913   CREATININE 1.07 10/04/2015 0913   CREATININE 1.32 (H) 04/13/2015 1252      Component Value Date/Time   CALCIUM 8.4 (L) 10/04/2015 0913   ALKPHOS 92 10/04/2015 0913   AST 19 10/04/2015 0913   ALT 12 (L) 10/04/2015 0913   BILITOT 0.3 10/04/2015 0913     Lab Results  Component Value Date   CEA 8.0 (H) 06/21/2015     PENDING LABS:   RADIOGRAPHIC STUDIES:  No results found.   PATHOLOGY:    ASSESSMENT AND PLAN:  Gastric adenocarcinoma ([place]) Stage IIIA (T2N3AM0) invasive adenocarcinoma of antrum of stomach with 8/16 lymph nodes involved with metastatic disease having undergone definitive resection by Dr. Barry Dienes on 08/02/2015.  Genetic testing was declined by patient, but consultation completed on 10/07/2015 with [name].  He underwent 2 cycles of FOLFOX chemotherapy in the adjuvant setting (09/20/2015- 10/18/2015).  Patient decided to discontinue therapy after 2 cycles of chemotherapy.  Oncology history is updated.  He  is agreeable to maintain his port at this time.  Port flush with labs today: CBC diff, CMET, CEA.  I personally reviewed and went over laboratory results with the patient.  The results are noted within this dictation.  Smoking cessation education is provided.  Labs in 3 months: CBC diff, CMET, CEA.  Return in 3 months for follow-up.  Adenocarcinoma of cecum ([place]) Stage IIIB (T3N1BM0) invasive adenocarcinoma of cecum, S/P resection by Dr. Barry Dienes on 08/02/2015.  His Stage IIIB disease absolutely meets criteria for adjuvant chemotherapy with FOLFOX.  HOWEVER, his simultaneous diagnosis of adenocarcinoma of stomach is the patient's biggest mortality.   He discontinued FOLFOX chemotherapy after 2 cycles.  Labs today and in 3 months: CBC diff, CMET, CEA.  At his return appointment, we can discuss timing of re-imaging and colonoscopy.  Adenocarcinoma of rectosigmoid junction (HCC) Stage IIA (T3N0M0) invasive adenocarcinoma of rectosigmoid colon, S/P surgical resection by Dr. Barry Dienes on 08/02/2015.  He discontinued FOLFOX chemotherapy after 2 cycles.   ORDERS PLACED FOR THIS ENCOUNTER: Orders Placed This Encounter  Procedures  . CBC with Differential  . Comprehensive metabolic panel  . CEA    MEDICATIONS PRESCRIBED THIS ENCOUNTER: Meds ordered this encounter  Medications  . aspirin 81 MG EC tablet    Sig: Take 81 mg by mouth daily.    Refill:  12  . lidocaine-prilocaine (EMLA) cream    Sig: APPLY A QUARTER SIZE AMOUNT TO PORT SITE 1 HOUR PRIOR TO CHEMO. DO NOT RUB IN. COVER WITH PLASTIC WRAP.    Refill:  3  . KIONEX 15 GM/60ML suspension  . tamsulosin (FLOMAX) 0.4 MG CAPS capsule  . diphenoxylate-atropine (LOMOTIL) 2.5-0.025 MG tablet    Sig: Take 1-2 tablets by mouth 4 (four) times daily as needed for diarrhea or loose stools.    Dispense:  30 tablet    Refill:  3    Order Specific  Question:   Supervising Provider    Answer:   Patrici Ranks [id number]    THERAPY PLAN:    NCCN guidelines for surveillance for Colon cancer are as follows (1.2017):  A. Stage I   1. Colonoscopy at year 1    A. If advanced adenoma, repeat in 1 year    B. If no advanced adenoma, repeat in 3 years, and then every 5 years.  B. Stage II, Stage III   1. H+P every 3-6 months x 2 years and then every 6 months for a total of 5 years    2. CEA every 3-6 months x 2 years and then every 6 months for a total of 5 years    3. CT CAP every 6-12 months (category 2B for frequency < 12 months) for a total of 5 years .   4.  Colonoscopy in 1 year except if no preoperative colonoscopy due to obstructing lesion, colonoscopy in 3-6 months.     A. If advanced adenoma, repeat in 1 year    B. If no advanced adenoma, repeat in 3 years, then every 5 years   5. PET/CT scan is not recommended.  C. Stage IV   1. H+P every 3-6 months x 2 years and then every 6 months for a total of 5 years    2. CEA every 3 months x 2 years and then every 6 months for a total of 3- 5 years    3. CT CAP every 3-6 months (category 2B for frequency < 6 months) x 2 years., then every 6-12 months for a total of 5 years .   4. Colonoscopy in 1 year except if no preoperative colonoscopy due to obstructing lesion, colonoscopy in 3-6 months.     A. If advanced adenoma, repeat in 1 year    B. If no advanced adenoma, repeat in 3 years, then every 5 years   All questions were answered. The patient knows to call the clinic with any problems, questions or concerns. We can certainly see the patient much sooner if necessary.  Patient and plan discussed with Dr. Ancil Linsey and she is in agreement with the aforementioned.   This note is electronically signed by: Doy Mince 11/29/2015 11:36 AM

## 2015-11-30 ENCOUNTER — Telehealth (HOSPITAL_COMMUNITY): Payer: Self-pay | Admitting: *Deleted

## 2015-11-30 LAB — CEA: CEA: 4.5 ng/mL (ref 0.0–4.7)

## 2015-11-30 NOTE — Telephone Encounter (Signed)
Pt aware that labs were normal

## 2015-11-30 NOTE — Telephone Encounter (Signed)
-----   Message from Thomas S Kefalas, PA-C sent at 11/30/2015  4:26 PM EDT ----- WNL 

## 2015-12-14 ENCOUNTER — Other Ambulatory Visit: Payer: Self-pay | Admitting: Cardiovascular Disease

## 2015-12-20 ENCOUNTER — Ambulatory Visit (INDEPENDENT_AMBULATORY_CARE_PROVIDER_SITE_OTHER): Payer: Commercial Managed Care - HMO | Admitting: Cardiovascular Disease

## 2015-12-20 ENCOUNTER — Encounter: Payer: Self-pay | Admitting: Cardiovascular Disease

## 2015-12-20 VITALS — BP 159/64 | HR 59 | Ht 70.0 in | Wt 154.2 lb

## 2015-12-20 DIAGNOSIS — I2510 Atherosclerotic heart disease of native coronary artery without angina pectoris: Secondary | ICD-10-CM

## 2015-12-20 DIAGNOSIS — Z85038 Personal history of other malignant neoplasm of large intestine: Secondary | ICD-10-CM | POA: Diagnosis not present

## 2015-12-20 DIAGNOSIS — Z85028 Personal history of other malignant neoplasm of stomach: Secondary | ICD-10-CM

## 2015-12-20 DIAGNOSIS — E785 Hyperlipidemia, unspecified: Secondary | ICD-10-CM | POA: Diagnosis not present

## 2015-12-20 DIAGNOSIS — Z716 Tobacco abuse counseling: Secondary | ICD-10-CM | POA: Diagnosis not present

## 2015-12-20 DIAGNOSIS — E7800 Pure hypercholesterolemia, unspecified: Secondary | ICD-10-CM

## 2015-12-20 NOTE — Progress Notes (Signed)
[name], male   DOB: August 18, 1945, 70 y.o.   MRN: [id number]     [name] is a 70 y.o. male who presents for a 9 month followup cardiology evaluation.  Steven Drake has established CAD and was transferred acutely from [facility] on 01/19/2013 with possible ST segment elevation myocardial infarction.  Initial troponin was negative but a subsequent troponin was positive and he was noted to have anterolateral ST changes. Upon arrival to [facility] he had T-wave inversion in 1 and aVL, V4 through V6. Cardiac catheterization done emergently by me showed low normal LV function with moderately severe focal hypocontractility of the mid anterolateral wall and minimal hypocontractility of the focal inferolateral wall. Ejection fraction was approximately 50-55%. He was found to have 40% smooth ostial narrowing of the LAD, a dominant left circumflex vessel with 70% ostial narrowing in the first marginal branch and diffuse 50% narrowing in the second marginal branch. His RCA was nondominant and had a 95% proximal stenosis and he had a subtotal/total mid occlusion with moderate right to right bridging collaterals and extensive left to right collaterals. Medical therapy was recommended and smoking cessation was felt to be imperative.   He has a long-standing history of tobacco and continues to smoke 1 ppd.  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  He continues to be on dual antiplatelet therapy and denies bleeding.  He also is on lisinopril 5 mg, amlodipine 2.5 mg and atorvastatin 40 mg. initially had been on Zetia but is no longer taking this.  He has history of GERD for which she is on Protonix.  He presents for evaluation.  I have reviewed recent laboratory from 08/24/2014.  He had been working outside in significant heat.  Hemoglobin was 13.2 medical 38.9.  BUN 15, creatinine 1.53, which had  increased from one year ago.  He is on a reduced dose of lisinopril at 5 mg.  Cholesterol was 140, triglycerides are elevated at 202, HDL was low at 25, and LDL 75.  TSH was normal.  Since I last saw him, he has continued to be active.  Low she may Doppler evaluation was done in January 2017 which revealed triphasic waveforms.  ABI was 1.1 on the right 1.0 on the left.  However, bilateral great toe PPG's were abnormal.  He underwent surgery, both for colon cancer and gastric adenocarcinoma with a laparoscopic subtotal colectomy with ileorectal anastomosis and open distal gastrectomy with Billroth II reconstruction on 08/02/2015.  He tolerated surgery without cardiovascular compromise.  He is still smoking 1 pack per day.  He denies or exertional shortness of breath.  Does experience hip discomfort with walking and has attributed this to arthritis.  He is unaware of palpitations.  She denies PND orthopnea.  He denies visual changes.  At times there is some trivial ankle swelling.  He presents for evaluation.  Past Medical History  Diagnosis Date  . GERD (gastroesophageal reflux disease)   . STEMI (ST elevation myocardial infarction), 01/19/13 01/19/2013  . CAD (coronary artery disease) RCA non dominant vessel, LAD 40%, 80-90% 2nd diag EF 55%  01/19/2013  . Hyperlipidemia LDL goal < 70 01/19/2013  . Pulmonary nodules 01/19/2013  . Hypokalemia 01/20/2013  . Tobacco abuse 01/20/2013  . Metabolic syndrome, with mildly elevated HgBA1C 01/20/2013    Past Surgical History  Procedure Laterality Date  . Appendectomy  No Known Allergies  Current Outpatient Prescriptions  Medication Sig Dispense Refill  . acetaminophen (TYLENOL) 325 MG tablet Take 2 tablets (650 mg total) by mouth every 4 (four) hours as needed for headache or mild pain.      Marland Kitchen aspirin EC 81 MG EC tablet Take 1 tablet (81 mg total) by mouth daily.      . nitroGLYCERIN (NITROSTAT) 0.4 MG SL tablet Place 1 tablet (0.4 mg total)  under the tongue every 5 (five) minutes x 3 doses as needed for chest pain.  25 tablet  4  . amLODipine (NORVASC) 2.5 MG tablet Take 1 tablet (2.5 mg total) by mouth daily.  30 tablet  6  . atorvastatin (LIPITOR) 80 MG tablet Take 1 tablet (80 mg total) by mouth daily at 6 PM.  30 tablet  6  . clopidogrel (PLAVIX) 75 MG tablet Take 1 tablet (75 mg total) by mouth daily with breakfast.  30 tablet  6  . lisinopril (PRINIVIL,ZESTRIL) 5 MG tablet Take 1 tablet (5 mg total) by mouth daily.  30 tablet  6  . pantoprazole (PROTONIX) 40 MG tablet Take 1 tablet (40 mg total) by mouth daily at 6 (six) AM.  30 tablet  6   No current facility-administered medications for this visit.    History   Social History  . Marital Status: Widowed    Spouse Name: N/A    Number of Children: N/A  . Years of Education: N/A   Occupational History  . farmer    Social History Main Topics  . Smoking status: Current Every Day Smoker -- 1.00 packs/day for 75 years    Types: Cigarettes    Start date: 01/21/1963  . Smokeless tobacco: Never Used  . Alcohol Use: Not on file  . Drug Use: Not on file  . Sexual Activity: Not on file   Other Topics Concern  . Not on file   Social History Narrative  . No narrative on file   Social history is notable in that he is widowed. He does not have children. He is retired. He had smoked for 55 years but quit smoking 2 weeks ago at the time of his acute event. He does not drink alcohol.   Family history is notable for parents are deceased.  ROS General: Negative; No fevers, chills, or night sweats;  HEENT: Negative; No changes in vision or hearing, sinus congestion, difficulty swallowing Pulmonary: Negative; No cough, wheezing, shortness of breath, hemoptysis Cardiovascular: Negative; No chest pain, presyncope, syncope, palpatations GI: Negative; No nausea, vomiting, diarrhea, or abdominal pain GU: Negative; No dysuria, hematuria, or difficulty voiding Musculoskeletal:  Occasional shoulder discomfort; no myalgias,  or weakness Hematologic/Oncology: Negative; no easy bruising, bleeding Endocrine: Negative; no heat/cold intolerance; no diabetes Neuro: Negative; no changes in balance, headaches Skin: Negative; No rashes or skin lesions Psychiatric: Negative; No behavioral problems, depression Sleep: Negative; No snoring, daytime sleepiness, hypersomnolence, bruxism, restless legs, hypnogognic hallucinations, no cataplexy Other comprehensive 14 point system review is negative.   PE BP (!) 159/64   Pulse (!) 59   Ht 5' 10"  (1.778 m)   Wt 154 lb 3.2 oz (69.9 kg)   BMI 22.13 kg/m    Repeat blood pressure by me 140/66  Wt Readings from Last 3 Encounters:  12/20/15 154 lb 3.2 oz (69.9 kg)  11/29/15 153 lb 12.8 oz (69.8 kg)  10/18/15 149 lb (67.6 kg)   General: Alert, oriented, no distress.  Appears older than stated age.  Bearded  with tobacco stains on his beard. Skin: Significant facial wrinkles HEENT: Normocephalic, atraumatic. Pupils round and reactive; sclera anicteric;no lid lag.  Nose without nasal septal hypertrophy Mouth/Parynx benign;Mallampati scale 3 Neck: No JVD, no carotid bruits with normal carotid upstroke Lungs: Decreased breath sounds; no wheezing or rales Chest wall: no tenderness to palpitation Heart: RRR, s1 s2 normal 1/6 sem; no diastolic murmur. No rubs thrills or heaves. Abdomen: soft, nontender; no hepatosplenomehaly, BS+; abdominal aorta nontender and not dilated by palpation. Back: no CVA tenderness Pulses 2+ with bilateral femoral artery bruits. Extremities: no clubbing cyanosis or edema, Homan's sign negative  Neurologic: grossly nonfocal Psychologic: normal affect and mood.  ECG (independently read by me): Sinus bradycardia at 59 bpm.  January 2017 ECG (independently read by me): Sinus bradycardia with 1 PAC.  Heart rate 54 bpm.  Normal intervals.  No significant ST-T changes.  June 2016 ECG (independently read by  me): Sinus bradycardia 53 bpm.  January 2016 ECG (independently read by me): Sinus bradycardia 51 bpm.  July 2015 ECG (independently read by me): Sinus bradycardia 51 beats per minute.  No ectopy.  QTc interval 414 ms.  Prior 05/06/2013 ECG (independently read by me): Sinus bradycardia 51 beats per minute. No ectopy. No significant ST changes.  Prior ECG from 02/04/2013 ECG: Sinus rhythm with T-wave inversion in leads 1 and avL and V2  LABS: BMP Latest Ref Rng & Units 11/29/2015 10/04/2015 09/29/2015  Glucose 65 - 99 mg/dL 95 152(H) 118(H)  BUN 6 - 20 mg/dL 10 16 22(H)  Creatinine 0.61 - 1.24 mg/dL 0.80 1.07 1.61(H)  Sodium 135 - 145 mmol/L 138 135 136  Potassium 3.5 - 5.1 mmol/L 3.9 3.6 5.3(H)  Chloride 101 - 111 mmol/L 107 104 104  CO2 22 - 32 mmol/L 26 22 26   Calcium 8.9 - 10.3 mg/dL 8.9 8.4(L) 9.0   Hepatic Function Latest Ref Rng & Units 11/29/2015 10/04/2015 09/29/2015  Total Protein 6.5 - 8.1 g/dL 6.7 6.5 7.2  Albumin 3.5 - 5.0 g/dL 3.2(L) 3.2(L) 3.5  AST 15 - 41 U/L 16 19 24   ALT 17 - 63 U/L 11(L) 12(L) 13(L)  Alk Phosphatase 38 - 126 U/L 103 92 92  Total Bilirubin 0.3 - 1.2 mg/dL 0.3 0.3 0.5   CBC Latest Ref Rng & Units 11/29/2015 10/04/2015 09/29/2015  WBC 4.0 - 10.5 K/uL 8.3 5.7 7.5  Hemoglobin 13.0 - 17.0 g/dL 10.5(L) 10.3(L) 10.5(L)  Hematocrit 39.0 - 52.0 % 33.2(L) 31.1(L) 31.9(L)  Platelets 150 - 400 K/uL 266 157 185   Lab Results  Component Value Date   MCV 90.2 11/29/2015   MCV 89.9 10/04/2015   MCV 89.1 09/29/2015   Lab Results  Component Value Date   TSH 0.97 04/13/2015   Lab Results  Component Value Date   HGBA1C 6.2 (H) 07/29/2015   Lipid Panel     Component Value Date/Time   CHOL 116 (L) 04/13/2015 1252   TRIG 162 (H) 04/13/2015 1252   HDL 24 (L) 04/13/2015 1252   CHOLHDL 4.8 04/13/2015 1252   VLDL 32 (H) 04/13/2015 1252   LDLCALC 60 04/13/2015 1252     RADIOLOGY: No results found.    ASSESSMENT AND PLAN: Steven Drake is a 14 -year-old gentleman  who presented with an acute coronary syndrome and underwent emergent cardiac catheterization on11/23/2014. He was found to have subtotal occlusion of his RCA and had both right to right and extensive left-to-right collaterals. He also had concomitant CAD as noted above with 80-90%  stenosis in a small second diagonal branch of his LAD as well as 70 and 50% stenoses in his marginal vessels. He had a mild wall motion abnormality consistent with his diagonal stenosis.  He has been on medical therapy.  Hsstais blood pressure today is controlled on lisinopril 5 mg daily.,  His hyperlipidemia is well treated with atorvastatin 40 mg.  He continues to be on dual antiplatelet therapy with aspirin and Plavix.  He denies bleeding.  He is on pantoprazole 40 mg for GERD.  He has femoral artery bruits and I reviewed his lower extremity Doppler study which showed normal ABIs bilaterally.  I reviewed his hospital records concerning his: CA and gastric adenoma carcinoma.  He tells me subsequent to the surgery he underwent 2 rounds of chemotherapy but stopped taking this.  His Port-A-Cath is still in place.  I long discussion concerning the importance of absolute smoking cessation.  He does not have any interest to quit.  He is not having any anginal symptomatology on his current medical regimen.  He has not required sl nitroglycerin use.  At times he admits to trace ankle swelling but there is no swelling present today.  I willl see him in one year for reevaluation or sooner if problems arise.  Time spent: 25 minutes  Troy Sine, MD, [facility]  6:03 PM  12/20/2015

## 2015-12-20 NOTE — Patient Instructions (Signed)
Your physician wants you to follow-up in: 1 year or sooner if needed. You will receive a reminder letter in the mail two months in advance. If you don't receive a letter, please call our office to schedule the follow-up appointment.   If you need a refill on your cardiac medications before your next appointment, please call your pharmacy.   

## 2016-01-11 ENCOUNTER — Telehealth: Payer: Self-pay | Admitting: Family Medicine

## 2016-01-18 ENCOUNTER — Encounter (HOSPITAL_COMMUNITY): Payer: Commercial Managed Care - HMO | Attending: Hematology & Oncology

## 2016-01-18 DIAGNOSIS — Z452 Encounter for adjustment and management of vascular access device: Secondary | ICD-10-CM | POA: Diagnosis not present

## 2016-01-18 DIAGNOSIS — C180 Malignant neoplasm of cecum: Secondary | ICD-10-CM

## 2016-01-18 DIAGNOSIS — C189 Malignant neoplasm of colon, unspecified: Secondary | ICD-10-CM | POA: Insufficient documentation

## 2016-01-18 DIAGNOSIS — R918 Other nonspecific abnormal finding of lung field: Secondary | ICD-10-CM | POA: Insufficient documentation

## 2016-01-18 DIAGNOSIS — C169 Malignant neoplasm of stomach, unspecified: Secondary | ICD-10-CM

## 2016-01-18 DIAGNOSIS — D649 Anemia, unspecified: Secondary | ICD-10-CM | POA: Insufficient documentation

## 2016-01-18 MED ORDER — SODIUM CHLORIDE 0.9% FLUSH
10.0000 mL | INTRAVENOUS | Status: DC | PRN
  Administered 2016-01-18: 10 mL via INTRAVENOUS
  Filled 2016-01-18: qty 10

## 2016-01-18 MED ORDER — HEPARIN SOD (PORK) LOCK FLUSH 100 UNIT/ML IV SOLN
INTRAVENOUS | Status: AC
  Filled 2016-01-18: qty 5

## 2016-01-18 MED ORDER — HEPARIN SOD (PORK) LOCK FLUSH 100 UNIT/ML IV SOLN
500.0000 [IU] | Freq: Once | INTRAVENOUS | Status: AC
  Administered 2016-01-18: 500 [IU] via INTRAVENOUS

## 2016-01-18 NOTE — Progress Notes (Signed)
Steven Drake presented for Portacath access and flush. Portacath located right chest wall accessed with  H 20 needle. Good blood return present. Portacath flushed with 38ml NS and 500U/5ml Heparin and needle removed intact. Procedure without incident. Patient tolerated procedure well.  Vitals stable and discharged ambulatory from clinic. Follow up as scheduled.

## 2016-01-18 NOTE — Patient Instructions (Signed)
[facility] at [facility] Discharge Instructions  RECOMMENDATIONS MADE BY THE CONSULTANT AND ANY TEST RESULTS WILL BE SENT TO YOUR REFERRING PHYSICIAN.  Port flush done Follow up as scheduled.  Thank you for choosing [facility] at [facility] to provide your oncology and hematology care.  To afford each patient quality time with our provider, please arrive at least 15 minutes before your scheduled appointment time.   Beginning January 23rd 2017 lab work for the Cancer Center will be done in the  Main lab at [facility] on 1st floor. If you have a lab appointment with the Cancer Center please come in thru the  Main Entrance and check in at the main information desk  You need to re-schedule your appointment should you arrive 10 or more minutes late.  We strive to give you quality time with our providers, and arriving late affects you and other patients whose appointments are after yours.  Also, if you no show three or more times for appointments you may be dismissed from the clinic at the providers discretion.     Again, thank you for choosing [facility].  Our hope is that these requests will decrease the amount of time that you wait before being seen by our physicians.       _____________________________________________________________  Should you have questions after your visit to [facility], please contact our office at [phone] between the hours of 8:30 a.m. and 4:30 p.m.  Voicemails left after 4:30 p.m. will not be returned until the following business day.  For prescription refill requests, have your pharmacy contact our office.         Resources For Cancer Patients and their Caregivers ? American Cancer Society: Can assist with transportation, wigs, general needs, runs Look Good Feel Better.        1-888-227-6333 ? Cancer Care: Provides financial assistance, online support groups, medication/co-pay  assistance.  1-800-813-HOPE (4673) ? [facility] Assists [place] cancer patients and their families through emotional , educational and financial support.  336-427-4357 ? [place] DSS Where to apply for food stamps, Medicaid and utility assistance. 336-342-1394 ? [facility]: Transportation to medical appointments. 336-347-2287 ? Social Security Administration: May apply for disability if have a Stage IV cancer. 336-342-7796 1-800-772-1213 ? [place] Aging, Disability and Transit Services: Assists with nutrition, care and transit needs. 336-349-2343  Cancer Center Support Programs: @10RELATIVEDAYS@ > Cancer Support Group  2nd Tuesday of the month 1pm-2pm, Journey Room  > Creative Journey  3rd Tuesday of the month 1130am-1pm, Journey Room  > Look Good Feel Better  1st Wednesday of the month 10am-12 noon, Journey Room (Call American Cancer Society to register 1-800-395-5775)   

## 2016-01-24 ENCOUNTER — Ambulatory Visit (INDEPENDENT_AMBULATORY_CARE_PROVIDER_SITE_OTHER): Payer: Commercial Managed Care - HMO | Admitting: Nurse Practitioner

## 2016-01-24 ENCOUNTER — Encounter: Payer: Self-pay | Admitting: Nurse Practitioner

## 2016-01-24 DIAGNOSIS — C169 Malignant neoplasm of stomach, unspecified: Secondary | ICD-10-CM | POA: Insufficient documentation

## 2016-01-24 DIAGNOSIS — C189 Malignant neoplasm of colon, unspecified: Secondary | ICD-10-CM

## 2016-01-24 DIAGNOSIS — C163 Malignant neoplasm of pyloric antrum: Secondary | ICD-10-CM

## 2016-01-24 NOTE — Patient Instructions (Signed)
1. Into need to follow-up with oncology. 2. I will ask Dr. Gala Romney when we should repeat your colonoscopy and endoscopy. We will get back to you. 3. Return for follow-up in 6 months, or sooner if worsening problems.

## 2016-01-24 NOTE — Progress Notes (Signed)
Referring Provider: Dettinger, Fransisca Kaufmann, MD Primary Care Physician:  Fransisca Kaufmann Dettinger, MD Primary GI:  Dr. Gala Romney  Chief Complaint  Patient presents with  . Follow-up    HPI:   Steven Drake is a 70 y.o. male who presents for follow-up on gastric cancer and colon cancer. The patient was last seen in our office 07/21/2015 for postprocedure follow-up after colonoscopy and endoscopy. Essentially found cecal neoplasm, large polyp in the mid sigmoid section with piecemeal removal and tattooing, cecal lesion found to be adenocarcinoma, hepatic lesion focal adenocarcinoma, sigmoid lesion high-grade dysplasia. Pyloric lesion was also found to be positive for invasive, poorly differentiated adenocarcinoma. At his last visit he get to be scheduled for surgery.  Surgery completed on 08/02/2015 include laparoscopic subtotal colectomy and distal gastrectomy (antral resection of tumor of stomach) by Dr. Barry Dienes in [place]. Surgical pathology found invasive adenocarcinoma, poorly differentiated, 3.0 cm, extending into the muscularis propria, 8/16 positive lymph nodes for metastatic disease, negative margins.  The patient has been seeing hematology. Last hematology visit 11-2015 for follow-up on: 1. Adenocarcinoma of cecum [facility]) Staging form: Colon and Rectum, AJCC 7th Edition Pathologic stage from 08/06/2015: Stage IIIB (T3, N1b, cM0) 2. Adenocarcinoma of rectosigmoid junction(HCC) Staging form: Colon and Rectum, AJCC 7th Edition Pathologic stage from 08/06/2015: Stage IIA (T3, N0, cM0) 3. Gastric adenocarcinoma(HCC) Staging form: Stomach, AJCC 7th Edition Pathologic stage from 08/06/2015: Stage IIIA (T2, N3a, cM0)  He received 2 cycles of chemotherapy from 09/20/2015 through 10/18/2015. Declined genetic counseling consultation. On 10/18/2015 the patient refused further chemotherapy, but did a lap port to remain in place. Per oncology notes he was doing well at his last visit with  some loose stools doing well with Lomotil. Weight stable, appetite strong, good energy. Labs were drawn and results reviewed, discussed smoking cessation, recommend 3 month follow-up with labs.  CBC and CMP deemed stable. CEA normal at 4.5, previously elevated at 8.0 seven months ago.  Today he states he feels "wide open." Still working daily. Has loose stools and stomach gurgling since surgery, which he states he was told would be normal for him moving forward. Appetite good. Denies abdominal pain, N/V, hematochezia, melena. Has chronic shoulder pain. Takes Tylenol as needed. Denies NSAIDs and ASA powders. Denies chest pain, dyspnea, dizziness, lightheadedness, syncope, near syncope. Denies any other upper or lower GI symptoms.  Past Medical History:  Diagnosis Date  . Adenocarcinoma of cecum ([place]) 07/01/2015  . Adenocarcinoma of rectosigmoid junction ([place]) 08/02/2015  . CAD (coronary artery disease) RCA non dominant vessel, LAD 40%, 80-90% 2nd diag EF 55%  01/19/2013   Dr. Claiborne Billings [place] 03-05-15 Epic.  . Family history of colon cancer   . Family history of stomach cancer   . Gastric adenocarcinoma ([place]) 06/29/2015  . Gastric ulcer    many yrs ago  . GERD (gastroesophageal reflux disease)   . Hyperlipidemia LDL goal < 70 01/19/2013  . Hypertension   . Hypokalemia 01/20/2013  . Metabolic syndrome, with mildly elevated HgBA1C 01/20/2013  . Pulmonary nodules 01/19/2013  . STEMI (ST elevation myocardial infarction), 01/19/13 01/19/2013  . Tobacco abuse 01/20/2013  . Transfusion history    with gastric ulcer- many yrs ago    Past Surgical History:  Procedure Laterality Date  . APPENDECTOMY    . CATARACT EXTRACTION, BILATERAL    . COLONOSCOPY N/A 06/10/2015   RMR: cecal neoplasm  ? biopsied. large polyp at the hepatic flexure removed with piecmeal polypectomy and APC ablation. lesion tattooed.  Large polyp in the mid sigmoid status post piecemeal hot snare debulking and tattooing. this lesion not  completely removed. scatterd pancolonic diverticulsosis. no speciments collected.   . ESOPHAGOGASTRODUODENOSCOPY N/A 06/10/2015   Procedure: ESOPHAGOGASTRODUODENOSCOPY (EGD);  Surgeon: Daneil Dolin, MD;  Location: [facility] ENDO SUITE;  Service: Endoscopy;  Laterality: N/A;  . ESOPHAGOGASTRODUODENOSCOPY N/A 06/17/2015   RMR: normal esophagus small hiatal hernia. Abnormal nodular antrum .pyloric channel status post biopsy.   Marland Kitchen HERNIA REPAIR Right   . LAPAROSCOPIC SUBTOTAL COLECTOMY N/A 08/02/2015   Procedure: LAPAROSCOPIC SUBTOTAL COLECTOMY  AND DISTAL GASTRECTOMY ;  Surgeon: Stark Klein, MD;  Location: [facility] ORS;  Service: General;  Laterality: N/A;  . LEFT HEART CATH N/A 01/19/2013   Procedure: LEFT HEART CATH;  Surgeon: Troy Sine, MD;  Location: [facility] C[facility] LAB;  Service: Cardiovascular;  Laterality: N/A;    Current Outpatient Prescriptions  Medication Sig Dispense Refill  . aspirin 81 MG EC tablet Take 81 mg by mouth daily.  12  . atorvastatin (LIPITOR) 40 MG tablet TAKE 1 TABLET BY MOUTH EVERY DAY 90 tablet 0  . clopidogrel (PLAVIX) 75 MG tablet TAKE 1 TABLET BY MOUTH DAILY WITH BREAKFAST 30 tablet 3  . diphenoxylate-atropine (LOMOTIL) 2.5-0.025 MG tablet Take 1-2 tablets by mouth 4 (four) times daily as needed for diarrhea or loose stools. 30 tablet 3  . lidocaine-prilocaine (EMLA) cream APPLY A QUARTER SIZE AMOUNT TO PORT SITE 1 HOUR PRIOR TO CHEMO. DO NOT RUB IN. COVER WITH PLASTIC WRAP.  3  . lisinopril (PRINIVIL,ZESTRIL) 5 MG tablet TAKE 1 TABLET BY MOUTH EVERY DAY 90 tablet 0  . ondansetron (ZOFRAN) 8 MG tablet Take 1 tablet (8 mg total) by mouth every 8 (eight) hours as needed for nausea or vomiting. 30 tablet 2  . pantoprazole (PROTONIX) 40 MG tablet TAKE 1 TABLET BY MOUTH DAILY AT 6:00 A.M. 30 tablet 3  . prochlorperazine (COMPAZINE) 10 MG tablet Take 1 tablet (10 mg total) by mouth every 6 (six) hours as needed for nausea or vomiting. 30 tablet 2   No current facility-administered  medications for this visit.     Allergies as of 01/24/2016 - Review Complete 01/24/2016  Allergen Reaction Noted  . Contrast media [iodinated diagnostic agents] Itching and Rash 05/20/2015    Family History  Problem Relation Age of Onset  . Heart attack Mother   . Colon cancer Father     dx in his 31s  . Colon cancer Sister     dx possibly in her early 67s  . Stomach cancer Sister     dx in her 24s  . Colon cancer Paternal Uncle     Social History   Social History  . Marital status: Widowed    Spouse name: N/A  . Number of children: 0  . Years of education: N/A   Occupational History  . farmer    Social History Main Topics  . Smoking status: Current Every Day Smoker    Packs/day: 1.00    Years: 58.00    Types: Cigarettes    Start date: 01/21/1963  . Smokeless tobacco: Never Used     Comment: one pack daily  . Alcohol use No     Comment: none in 15 yrs -heavy user-none now.  . Drug use: No  . Sexual activity: Not Asked   Other Topics Concern  . None   Social History Narrative  . None    Review of Systems: General: Negative for anorexia, weight loss, fever,  chills, fatigue, weakness. ENT: Negative for hoarseness, difficulty swallowing. CV: Negative for chest pain, angina, palpitations, peripheral edema.  Respiratory: Negative for dyspnea at rest, cough, sputum, wheezing.  GI: See history of present illness. Endo: Negative for unusual weight change.  Heme: Negative for bruising or bleeding.   Physical Exam: BP 128/72   Pulse 65   Temp 98 F (36.7 C) (Oral)   Ht 5\' 10"  (1.778 m)   Wt 156 lb 3.2 oz (70.9 kg)   BMI 22.41 kg/m  General:   Alert and oriented. Pleasant and cooperative. Well-nourished and well-developed. . Eyes:  Without icterus, sclera clear and conjunctiva pink.  Ears:  Normal auditory acuity. Cardiovascular:  S1, S2 present without murmurs appreciated. Extremities without clubbing or edema. Respiratory:  Clear to auscultation  bilaterally. No wheezes, rales, or rhonchi. No distress.  Gastrointestinal:  +BS, soft, non-tender and non-distended. No HSM noted. No guarding or rebound. No masses appreciated. Faint abdominal incisional scar noted. Rectal:  Deferred  Musculoskalatal:  Symmetrical without gross deformities. Neurologic:  Alert and oriented x4;  grossly normal neurologically. Psych:  Alert and cooperative. Normal mood and affect. Heme/Lymph/Immune: No excessive bruising noted.    01/24/2016 9:05 AM   Disclaimer: This note was dictated with voice recognition software. Similar sounding words can inadvertently be transcribed and may not be corrected upon review.

## 2016-01-28 NOTE — Assessment & Plan Note (Signed)
Patient with colon CA s/p surgery and limited chemo. Has declined further chemotherapy. Discussed with Dr. Gala Romney and will schedule for 1 year surveillance exam (flex sig for remaining rectum with TCS prep; April 2018). Continue follow-up with oncology. Return for follow-up in 6 months and can plan surveillance at that time.

## 2016-01-28 NOTE — Assessment & Plan Note (Addendum)
Patient with gastric cancer, currently doing well. Has declined further chemotherapy with heme/onc. Recommend continue follow-up with oncology. Will decide on EGD surveillance, although it usually isn't done. Will reach out to oncology for any recommendations they may have on top of typical protocol. Return for follow-up in 6 months.

## 2016-01-31 NOTE — Progress Notes (Signed)
CC'ED TO PCP 

## 2016-02-14 ENCOUNTER — Other Ambulatory Visit: Payer: Self-pay | Admitting: Family Medicine

## 2016-03-01 ENCOUNTER — Encounter (HOSPITAL_COMMUNITY): Payer: Self-pay | Admitting: Hematology & Oncology

## 2016-03-01 ENCOUNTER — Encounter (HOSPITAL_COMMUNITY): Payer: Commercial Managed Care - HMO | Attending: Hematology & Oncology | Admitting: Hematology & Oncology

## 2016-03-01 ENCOUNTER — Encounter (HOSPITAL_BASED_OUTPATIENT_CLINIC_OR_DEPARTMENT_OTHER): Payer: Commercial Managed Care - HMO

## 2016-03-01 VITALS — BP 121/54 | HR 57 | Temp 97.9°F | Resp 18 | Ht 71.0 in | Wt 158.5 lb

## 2016-03-01 DIAGNOSIS — Z720 Tobacco use: Secondary | ICD-10-CM | POA: Diagnosis not present

## 2016-03-01 DIAGNOSIS — R918 Other nonspecific abnormal finding of lung field: Secondary | ICD-10-CM | POA: Diagnosis not present

## 2016-03-01 DIAGNOSIS — D509 Iron deficiency anemia, unspecified: Secondary | ICD-10-CM | POA: Diagnosis not present

## 2016-03-01 DIAGNOSIS — C189 Malignant neoplasm of colon, unspecified: Secondary | ICD-10-CM | POA: Diagnosis not present

## 2016-03-01 DIAGNOSIS — C169 Malignant neoplasm of stomach, unspecified: Secondary | ICD-10-CM

## 2016-03-01 DIAGNOSIS — C180 Malignant neoplasm of cecum: Secondary | ICD-10-CM

## 2016-03-01 DIAGNOSIS — C19 Malignant neoplasm of rectosigmoid junction: Secondary | ICD-10-CM

## 2016-03-01 DIAGNOSIS — D649 Anemia, unspecified: Secondary | ICD-10-CM | POA: Insufficient documentation

## 2016-03-01 LAB — CBC WITH DIFFERENTIAL/PLATELET
Basophils Absolute: 0 10*3/uL (ref 0.0–0.1)
Basophils Relative: 0 %
Eosinophils Absolute: 0.3 10*3/uL (ref 0.0–0.7)
Eosinophils Relative: 4 %
HEMATOCRIT: 34.6 % — AB (ref 39.0–52.0)
HEMOGLOBIN: 11.1 g/dL — AB (ref 13.0–17.0)
LYMPHS ABS: 2.1 10*3/uL (ref 0.7–4.0)
LYMPHS PCT: 27 %
MCH: 28.6 pg (ref 26.0–34.0)
MCHC: 32.1 g/dL (ref 30.0–36.0)
MCV: 89.2 fL (ref 78.0–100.0)
MONOS PCT: 9 %
Monocytes Absolute: 0.7 10*3/uL (ref 0.1–1.0)
NEUTROS PCT: 60 %
Neutro Abs: 4.6 10*3/uL (ref 1.7–7.7)
Platelets: 211 10*3/uL (ref 150–400)
RBC: 3.88 MIL/uL — ABNORMAL LOW (ref 4.22–5.81)
RDW: 15 % (ref 11.5–15.5)
WBC: 7.8 10*3/uL (ref 4.0–10.5)

## 2016-03-01 LAB — COMPREHENSIVE METABOLIC PANEL
ALK PHOS: 107 U/L (ref 38–126)
ALT: 9 U/L — ABNORMAL LOW (ref 17–63)
ANION GAP: 5 (ref 5–15)
AST: 15 U/L (ref 15–41)
Albumin: 3.4 g/dL — ABNORMAL LOW (ref 3.5–5.0)
BILIRUBIN TOTAL: 0.3 mg/dL (ref 0.3–1.2)
BUN: 14 mg/dL (ref 6–20)
CALCIUM: 8.7 mg/dL — AB (ref 8.9–10.3)
CO2: 26 mmol/L (ref 22–32)
CREATININE: 1.06 mg/dL (ref 0.61–1.24)
Chloride: 103 mmol/L (ref 101–111)
Glucose, Bld: 99 mg/dL (ref 65–99)
Potassium: 3.5 mmol/L (ref 3.5–5.1)
Sodium: 134 mmol/L — ABNORMAL LOW (ref 135–145)
TOTAL PROTEIN: 6.5 g/dL (ref 6.5–8.1)

## 2016-03-01 LAB — FERRITIN: Ferritin: 16 ng/mL — ABNORMAL LOW (ref 24–336)

## 2016-03-01 MED ORDER — HEPARIN SOD (PORK) LOCK FLUSH 100 UNIT/ML IV SOLN
500.0000 [IU] | Freq: Once | INTRAVENOUS | Status: AC
  Administered 2016-03-01: 500 [IU] via INTRAVENOUS

## 2016-03-01 MED ORDER — SODIUM CHLORIDE 0.9% FLUSH
20.0000 mL | INTRAVENOUS | Status: DC | PRN
Start: 2016-03-01 — End: 2016-03-01
  Administered 2016-03-01: 20 mL via INTRAVENOUS
  Filled 2016-03-01: qty 20

## 2016-03-01 MED ORDER — HEPARIN SOD (PORK) LOCK FLUSH 100 UNIT/ML IV SOLN
INTRAVENOUS | Status: AC
  Filled 2016-03-01: qty 5

## 2016-03-01 NOTE — Patient Instructions (Signed)
[facility] at [facility] Discharge Instructions  RECOMMENDATIONS MADE BY THE CONSULTANT AND ANY TEST RESULTS WILL BE SENT TO YOUR REFERRING PHYSICIAN.  Port flush with labs  Thank you for choosing [facility] at [facility] to provide your oncology and hematology care.  To afford each patient quality time with our provider, please arrive at least 15 minutes before your scheduled appointment time.    If you have a lab appointment with the Cancer Center please come in thru the  Main Entrance and check in at the main information desk  You need to re-schedule your appointment should you arrive 10 or more minutes late.  We strive to give you quality time with our providers, and arriving late affects you and other patients whose appointments are after yours.  Also, if you no show three or more times for appointments you may be dismissed from the clinic at the providers discretion.     Again, thank you for choosing [facility].  Our hope is that these requests will decrease the amount of time that you wait before being seen by our physicians.       _____________________________________________________________  Should you have questions after your visit to [facility], please contact our office at [phone] between the hours of 8:30 a.m. and 4:30 p.m.  Voicemails left after 4:30 p.m. will not be returned until the following business day.  For prescription refill requests, have your pharmacy contact our office.       Resources For Cancer Patients and their Caregivers ? American Cancer Society: Can assist with transportation, wigs, general needs, runs Look Good Feel Better.        1-888-227-6333 ? Cancer Care: Provides financial assistance, online support groups, medication/co-pay assistance.  1-800-813-HOPE (4673) ? [facility] Assists [place] cancer patients and their families through  emotional , educational and financial support.  336-427-4357 ? [place] DSS Where to apply for food stamps, Medicaid and utility assistance. 336-342-1394 ? [facility]: Transportation to medical appointments. 336-347-2287 ? Social Security Administration: May apply for disability if have a Stage IV cancer. 336-342-7796 1-800-772-1213 ? [place] Aging, Disability and Transit Services: Assists with nutrition, care and transit needs. 336-349-2343  Cancer Center Support Programs: @10RELATIVEDAYS@ > Cancer Support Group  2nd Tuesday of the month 1pm-2pm, Journey Room  > Creative Journey  3rd Tuesday of the month 1130am-1pm, Journey Room  > Look Good Feel Better  1st Wednesday of the month 10am-12 noon, Journey Room (Call American Cancer Society to register 1-800-395-5775)    

## 2016-03-01 NOTE — Progress Notes (Signed)
[place]  Progress Note  Patient Care Team: Worthy Rancher, MD as PCP - General (Family Medicine) Daneil Dolin, MD as Consulting Physician (Gastroenterology)  CHIEF COMPLAINTS/PURPOSE OF CONSULTATION:    Gastric adenocarcinoma [facility])   06/17/2015 Procedure    Stomach biopsy of pyloric lesion, Dr. 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      06/28/2015 PET scan    Intensely hypermetabolic cecal mass. No definitive evidence of metastatic disease. Scattered upper lobe predominant pulmonary nodules are too small for PET resolution.       08/02/2015 Procedure    Antral resection of tumor of stomach by Dr. Stark Klein      08/06/2015 Pathology Results    Invasive adenocarcinoma, poorly differentiated, 3.0 cm, extending into the muscularis propria, LVI +, 8/16 positive lymph nodes for metastatic disease, negative margins.      08/06/2015 Cancer Staging    pT2N3A      09/15/2015 Procedure    Port placed by IR      09/20/2015 - 10/18/2015 Chemotherapy    FOLFOX x 2 cycles      09/29/2015 Treatment Plan Change    5 FU bolus is discontinued      10/07/2015 Survivorship    Genetic Counseling consultation with [name]- Despite our recommendation, Mr. Hippert did not wish to pursue genetic testing at today's visit. We understand this decision, and remain available to coordinate genetic testing at any time in the future. We; therefore, recommend Mr. Ravi continue to follow the cancer screening guidelines given by his primary healthcare provider.      10/18/2015 Treatment Plan Change    Patient refuses future chemotherapy treatment       Adenocarcinoma of cecum ([place])   06/10/2015 Procedure    Colonoscopy with Dr. Gala Romney, Cecal neoplasm. large polyp at hepatic flexure. lesion tattooed, large polyp in mid  sigmoid (lesion not completely removed)      06/10/2015 Pathology Results    Cecal mass - adenocarcinoma, polyp at hepatic flexure foci of adenocarcinoma arising from tubulovillous adenoma      06/28/2015 PET scan    Intensely hypermetabolic cecal mass. No definitive evidence of metastatic disease. Scattered upper lobe predominant pulmonary nodules are too small for PET resolution.       08/02/2015 Procedure    Segmental resection of tumor, right, transverse, descending colon by Dr. Stark Klein      08/06/2015 Pathology Results    Invasive adenocarcinoma, 4.3 cm, well-differentiated, extending through the muscularis propria into the pericolonic soft tissue. 2/14 lymph nodes positive for metastatic disease. Surgical margins are negative.      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 Gala Romney, Cecal neoplasm. large polyp at hepatic flexure. lesion tattooed, large polyp in mid sigmoid (lesion not completely removed)      06/10/2015 Pathology Results    Polyp sigmoid - tubulovillous adenoma with at least high grade dysplasia cannot rule out focal adenocarcinoma      06/28/2015 PET scan    Intensely hypermetabolic cecal mass. No definitive evidence of metastatic disease. Scattered upper lobe predominant pulmonary nodules are too small for PET resolution.       08/02/2015 Procedure    Segmental resection for tumor  of rectosigmoid colon by Dr. Stark Klein.      08/06/2015 Pathology Results    Invasive adenocarcinoma, 2.6 cm, well-differentiated, extending through the muscularis propria and into pericolonic soft tissue, 0/10 nodes involved, negative resection margins. No LVI.      08/06/2015 Cancer Staging    pT3N0       HISTORY OF PRESENTING ILLNESS:  Steven Drake 71 y.o. male is here for further follow-up of T2N3 Gastric Carcinoma, T3N1B adenocarcinoma of the cecum and T3N0 adenocarcinoma of the  rectosigmoid junction. He underwent definitve surgical therapy with Dr. Barry Dienes in June. He attempted adjuvant therapy with FOLFOX but was intolerant and unfortunately absolutely not interested in any other treatment.   Mr. Vidals returns to the [place] today accompanied by his neighbor. He is still smoking. "That is was keeps me going"   He is seeing [facility] for GI. He will undergo a Flex Sig and be formally seen again by GI in 6 months.  He has had loose stool since his surgery and notes that lomotil works the best. He reports that it does not interfere in his ADL's. He stays active and works daily.  He has occasional right shoulder pain, this is chronic.    Pt must eat very small portions or he feels sick. He is eating more frequently to accommodate for this. Occasional nausea. Denies abdominal pain. Weight has remained stable.    MEDICAL HISTORY:  Past Medical History:  Diagnosis Date  . Adenocarcinoma of cecum ([place]) 07/01/2015  . 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 CAD (coronary artery disease) RCA non dominant vessel, LAD 40%, 80-90% 2nd diag EF 55%  01/19/2013   Dr. Claiborne Billings [place] 03-05-15 Epic.  . Family history of colon cancer   . Family history of stomach cancer   . Gastric adenocarcinoma ([place]) 06/29/2015  . Gastric ulcer    many yrs ago  . GERD (gastroesophageal reflux disease)   . Hyperlipidemia LDL goal < 70 01/19/2013  . Hypertension   . Hypokalemia 01/20/2013  . Metabolic syndrome, with mildly elevated HgBA1C 01/20/2013  . Pulmonary nodules 01/19/2013  . STEMI (ST elevation myocardial infarction), 01/19/13 01/19/2013  . Tobacco abuse 01/20/2013  . Transfusion history    with gastric ulcer- many yrs ago    SURGICAL HISTORY: Past Surgical History:  Procedure Laterality Date  . APPENDECTOMY    . CATARACT EXTRACTION, BILATERAL    . COLONOSCOPY N/A 06/10/2015   RMR: cecal neoplasm  ? biopsied. large polyp at the hepatic flexure removed with  piecmeal polypectomy and APC ablation. lesion tattooed. Large polyp in the mid sigmoid status post piecemeal hot snare debulking and tattooing. this lesion not completely removed. scatterd pancolonic diverticulsosis. no speciments collected.   . ESOPHAGOGASTRODUODENOSCOPY N/A 06/10/2015   Procedure: ESOPHAGOGASTRODUODENOSCOPY (EGD);  Surgeon: Daneil Dolin, MD;  Location: [facility] ENDO SUITE;  Service: Endoscopy;  Laterality: N/A;  . ESOPHAGOGASTRODUODENOSCOPY N/A 06/17/2015   RMR: normal esophagus small hiatal hernia. Abnormal nodular antrum .pyloric channel status post biopsy.   Marland Kitchen HERNIA REPAIR Right   . LAPAROSCOPIC SUBTOTAL COLECTOMY N/A 08/02/2015   Procedure: LAPAROSCOPIC SUBTOTAL COLECTOMY  AND DISTAL GASTRECTOMY ;  Surgeon: Stark Klein, MD;  Location: [facility] ORS;  Service: General;  Laterality: N/A;  . LEFT HEART CATH N/A 01/19/2013   Procedure: LEFT HEART CATH;  Surgeon: Troy Sine, MD;  Location: [facility] CATH LAB;  Service: Cardiovascular;  Laterality: N/A;    SOCIAL HISTORY: Social History   Social History  .  Marital status: Widowed    Spouse name: N/A  . Number of children: 0  . Years of education: N/A   Occupational History  . farmer    Social History Main Topics  . Smoking status: Current Every Day Smoker    Packs/day: 1.00    Years: 58.00    Types: Cigarettes    Start date: 01/21/1963  . Smokeless tobacco: Never Used     Comment: one pack daily  . Alcohol use No     Comment: none in 15 yrs -heavy user-none now.  . Drug use: No  . Sexual activity: Not on file   Other Topics Concern  . Not on file   Social History Narrative  . No narrative on file  Widowed since 2003 Has 2 step kids Still smokes No ETOH use Retired. Used to drive a truck cross country for 45 years.   FAMILY HISTORY: Family History  Problem Relation Age of Onset  . Heart attack Mother   . Colon cancer Father     dx in his 77s  . Colon cancer Sister     dx possibly in her early 76s  . Stomach  cancer Sister     dx in her 12s  . Colon cancer Paternal Uncle   1 brother and 1 sister living Oldest brother died; sister died from stomach cancer; Other sister died of colon cancer Father died of colon cancer at 51 years old. Mother died of a heart attack in her 65s.  ALLERGIES:  is allergic to contrast media [iodinated diagnostic agents].  MEDICATIONS:  Current Outpatient Prescriptions  Medication Sig Dispense Refill  . aspirin 81 MG EC tablet Take 81 mg by mouth daily.  12  . atorvastatin (LIPITOR) 40 MG tablet TAKE 1 TABLET BY MOUTH EVERY DAY 90 tablet 0  . clopidogrel (PLAVIX) 75 MG tablet TAKE 1 TABLET BY MOUTH DAILY WITH BREAKFAST 30 tablet 3  . diphenoxylate-atropine (LOMOTIL) 2.5-0.025 MG tablet Take 1-2 tablets by mouth 4 (four) times daily as needed for diarrhea or loose stools. 30 tablet 3  . lidocaine-prilocaine (EMLA) cream APPLY A QUARTER SIZE AMOUNT TO PORT SITE 1 HOUR PRIOR TO CHEMO. DO NOT RUB IN. COVER WITH PLASTIC WRAP.  3  . lisinopril (PRINIVIL,ZESTRIL) 5 MG tablet TAKE 1 TABLET BY MOUTH EVERY DAY 90 tablet 0  . ondansetron (ZOFRAN) 8 MG tablet Take 1 tablet (8 mg total) by mouth every 8 (eight) hours as needed for nausea or vomiting. 30 tablet 2  . pantoprazole (PROTONIX) 40 MG tablet TAKE 1 TABLET BY MOUTH DAILY AT 6:00 A.M. 30 tablet 3  . prochlorperazine (COMPAZINE) 10 MG tablet Take 1 tablet (10 mg total) by mouth every 6 (six) hours as needed for nausea or vomiting. 30 tablet 2   No current facility-administered medications for this visit.     Review of Systems  Constitutional: Negative.        Eats in small portions.   HENT: Negative.   Eyes: Negative.   Respiratory: Negative.   Cardiovascular: Negative.   Gastrointestinal: Positive for diarrhea and nausea. Negative for abdominal pain.  Genitourinary: Negative.   Musculoskeletal: Positive for joint pain (right shoulder).  Skin: Negative.   Neurological: Negative.   Endo/Heme/Allergies: Negative.     Psychiatric/Behavioral: The patient has insomnia.   All other systems reviewed and are negative. 14 point ROS was done and is otherwise as detailed above or in HPI   PHYSICAL EXAMINATION: ECOG PERFORMANCE STATUS: 0 - Asymptomatic  Vitals:   03/01/16 1123  BP: (!) 121/54  Pulse: (!) 57  Resp: 18  Temp: 97.9 F (36.6 C)   Filed Weights   03/01/16 1123  Weight: 158 lb 8 oz (71.9 kg)     Physical Exam  Constitutional: He is oriented to person, place, and time and well-developed, well-nourished, and in no distress.  Pt was able to get on exam table without assistance. Cigarettes in shirt pocket  HENT:  Head: Normocephalic and atraumatic.  Nose: Nose normal.  Mouth/Throat: Oropharynx is clear and moist. No oropharyngeal exudate.  Eyes: Conjunctivae and EOM are normal. Pupils are equal, round, and reactive to light. Right eye exhibits no discharge. Left eye exhibits no discharge. No scleral icterus.  Neck: Normal range of motion. Neck supple. No tracheal deviation present. No thyromegaly present.  Cardiovascular: Normal rate, regular rhythm and normal heart sounds.  Exam reveals no gallop and no friction rub.   No murmur heard. Pulmonary/Chest: Effort normal and breath sounds normal. He has no wheezes. He has no rales.  Abdominal: Soft. Bowel sounds are normal. He exhibits no distension and no mass. There is no tenderness. There is no rebound and no guarding.  Musculoskeletal: Normal range of motion. He exhibits no edema.  Lymphadenopathy:    He has no cervical adenopathy.  Neurological: He is alert and oriented to person, place, and time. He has normal reflexes. No cranial nerve deficit. Gait normal. Coordination normal.  Skin: Skin is warm and dry. No rash noted.  Psychiatric: Mood, memory, affect and judgment normal.  Nursing note and vitals reviewed.   LABORATORY DATA:  I have reviewed the data as listed Lab Results  Component Value Date   WBC 7.8 03/01/2016   HGB 11.1  (L) 03/01/2016   HCT 34.6 (L) 03/01/2016   MCV 89.2 03/01/2016   PLT 211 03/01/2016   CMP     Component Value Date/Time   NA 134 (L) 03/01/2016 1116   K 3.5 03/01/2016 1116   CL 103 03/01/2016 1116   CO2 26 03/01/2016 1116   GLUCOSE 99 03/01/2016 1116   BUN 14 03/01/2016 1116   CREATININE 1.06 03/01/2016 1116   CREATININE 1.32 (H) 04/13/2015 1252   CALCIUM 8.7 (L) 03/01/2016 1116   PROT 6.5 03/01/2016 1116   ALBUMIN 3.4 (L) 03/01/2016 1116   AST 15 03/01/2016 1116   ALT 9 (L) 03/01/2016 1116   ALKPHOS 107 03/01/2016 1116   BILITOT 0.3 03/01/2016 1116   GFRNONAA >60 03/01/2016 1116   GFRAA >60 03/01/2016 1116    RADIOGRAPHIC STUDIES: I have personally reviewed the radiological images as listed and agreed with the findings in the report. No results found.   Study Result   CLINICAL DATA:  Colon carcinoma and need for porta cath to begin chemotherapy.  EXAM: IMPLANTED PORT A CATH PLACEMENT WITH ULTRASOUND AND FLUOROSCOPIC GUIDANCE  ANESTHESIA/SEDATION: 3.0 Mg IV Versed; 50 mcg IV Fentanyl  Total Moderate Sedation Time:  33 minutes  The patient's level of consciousness and physiologic status were continuously monitored during the procedure by Radiology nursing.  Additional Medications: 2 g IV Ancef. As antibiotic prophylaxis, Ancef was ordered pre-procedure and administered intravenously within one hour of incision.  FLUOROSCOPY TIME:  12 seconds.  PROCEDURE: The procedure, risks, benefits, and alternatives were explained to the patient. Questions regarding the procedure were encouraged and answered. The patient understands and consents to the procedure. A time-out was performed prior to initiating the procedure.  Ultrasound was used to confirm patency  of the right internal jugular vein. The right neck and chest were prepped with chlorhexidine in a sterile fashion, and a sterile drape was applied covering the operative field. Maximum barrier sterile  technique with sterile gowns and gloves were used for the procedure. Local anesthesia was provided with 1% lidocaine.  After creating a small venotomy incision, a 21 gauge needle was advanced into the right internal jugular vein under direct, real-time ultrasound guidance. Ultrasound image documentation was performed. After securing guidewire access, an 8 Fr dilator was placed. A J-wire was kinked to measure appropriate catheter length.  A subcutaneous port pocket was then created along the upper chest wall utilizing sharp and blunt dissection. Portable cautery was utilized. The pocket was irrigated with sterile saline.  A single lumen power injectable port was chosen for placement. The 8 Fr catheter was tunneled from the port pocket site to the venotomy incision. The port was placed in the pocket. External catheter was trimmed to appropriate length based on guidewire measurement.  At the venotomy, an 8 Fr peel-away sheath was placed over a guidewire. The catheter was then placed through the sheath and the sheath removed. Final catheter positioning was confirmed and documented with a fluoroscopic spot image. The port was accessed with a needle and aspirated and flushed with heparinized saline. The needle was removed.  The venotomy and port pocket incisions were closed with subcutaneous 3-0 Monocryl and subcuticular 4-0 Vicryl. Dermabond was applied to both incisions.  COMPLICATIONS: None  FINDINGS: After catheter placement, the tip lies at the cavoatrial junction. The catheter aspirates normally and is ready for immediate use.  IMPRESSION: Placement of single lumen port a cath via right internal jugular vein. The catheter tip lies at the cavoatrial junction. A power injectable port a cath was placed and is ready for immediate use.   Electronically Signed   By: Aletta Edouard M.D.   On: 09/15/2015 15:16    PATHOLOGY:   ASSESSMENT & PLAN:  Adenocarcinoma  of cecum ([place])   Staging form: Colon and Rectum, AJCC 7th Edition   - Pathologic stage from 08/06/2015: Stage IIIB (T3, N1b, cM0) - Signed by Baird Cancer, PA-C on 08/20/2015  Adenocarcinoma of rectosigmoid junction [facility])   Staging form: Colon and Rectum, AJCC 7th Edition   - Pathologic stage from 08/06/2015: Stage IIA (T3, N0, cM0) - Signed by Baird Cancer, PA-C on 08/20/2015  Gastric adenocarcinoma [facility])   Staging form: Stomach, AJCC 7th Edition   - Pathologic stage from 08/06/2015: Stage IIIA (T2, N3a, cM0) - Signed by Baird Cancer, PA-C on 6/23/2017Anemia Iron deficiency s/p one dose Injectafer Elevated CEA Bilateral Pulmonary nodules Tobacco Abuse  He had iron deficiency prior to surgery and was given one dose of injectafer.Iron levels are pending today. When available he will be notified and if additional iron is needed we will arrange for this.  Gastric adenocarcinoma ([place]) Stage IIIA (T2N3AM0) invasive adenocarcinoma of antrum of stomach with 8/16 lymph nodes involved with metastatic disease having undergone definitive resection by Dr. Barry Dienes on 08/02/2015.  He did not completed adjuvant FOLFOX.   Adenocarcinoma of cecum ([place]) Stage IIIB (T3N1BM0) invasive adenocarcinoma of cecum, S/P resection by Dr. Barry Dienes on 08/02/2015.  Again did not complete adjuvant FOLFOX, continues to follow with GI.  Adenocarcinoma of rectosigmoid junction ([facility]) Stage IIA (T3N0M0) invasive adenocarcinoma of rectosigmoid colon, S/P surgical resection by Dr. Barry Dienes on 08/02/2015.  Order repeat imaging for April or May.   Blood work today. Results are noted  above. Iron studies pending as noted.   He will return for a follow up in 3 months.   Tobacco cessation was again discussed with the patient. He notes that he is not interested in quitting.   Orders Placed This Encounter  Procedures  . CT Abdomen Pelvis W Contrast    Standing Status:   Future    Standing Expiration Date:   03/01/2017    Order  Specific Question:   If indicated for the ordered procedure, I authorize the administration of contrast media per Radiology protocol    Answer:   Yes    Order Specific Question:   Reason for Exam (SYMPTOM  OR DIAGNOSIS REQUIRED)    Answer:   restaging gastric cancer and colon cancer    Order Specific Question:   Preferred imaging location?    Answer:   [facility]  . CT Chest W Contrast    Standing Status:   Future    Standing Expiration Date:   03/01/2017    Order Specific Question:   If indicated for the ordered procedure, I authorize the administration of contrast media per Radiology protocol    Answer:   Yes    Order Specific Question:   Reason for Exam (SYMPTOM  OR DIAGNOSIS REQUIRED)    Answer:   restaging gastric carcinoma and colon cancer    Order Specific Question:   Preferred imaging location?    Answer:   [facility]  . CBC with Differential    Standing Status:   Future    Standing Expiration Date:   03/01/2017  . Comprehensive metabolic panel    Standing Status:   Future    Standing Expiration Date:   03/01/2017  . CEA    Standing Status:   Future    Standing Expiration Date:   03/01/2017  . Ferritin    Standing Status:   Future    Standing Expiration Date:   03/01/2017  . Ferritin    Standing Status:   Future    Number of Occurrences:   1    Standing Expiration Date:   03/01/2017     All questions were answered. The patient knows to call the clinic with any problems, questions or concerns.  This document serves as a record of services personally performed by Ancil Linsey, MD. It was created on her behalf by Martinique Casey, a trained medical scribe. The creation of this record is based on the scribe's personal observations and the provider's statements to them. This document has been checked and approved by the attending provider.  I have reviewed the above documentation for accuracy and completeness, and I agree with the above.  This note was electronically  signed.    Molli Hazard, MD  03/05/2016 6:36 PM

## 2016-03-01 NOTE — Progress Notes (Signed)
[name] presented for Portacath access and flush. Proper placement of portacath confirmed by CXR. Portacath located right chest wall accessed with  H 20 needle. Good blood return present.  Specimen drawn for labs.   Portacath flushed with 5ml NS and 500U/6ml Heparin and needle removed intact. Procedure without incident. Patient tolerated procedure well.

## 2016-03-01 NOTE — Patient Instructions (Addendum)
Steven Drake at [facility] Discharge Instructions  RECOMMENDATIONS MADE BY THE CONSULTANT AND ANY TEST RESULTS WILL BE SENT TO YOUR REFERRING PHYSICIAN.  You were seen today by Dr. Whitney Muse. Get CT scans done in April. Follow up with [name] after CT scans are done.    Thank you for choosing [place] at [facility] to provide your oncology and hematology care.  To afford each patient quality time with our provider, please arrive at least 15 minutes before your scheduled appointment time.    If you have a lab appointment with the [facility] please come in thru the  Main Entrance and check in at the main information desk  You need to re-schedule your appointment should you arrive 10 or more minutes late.  We strive to give you quality time with our providers, and arriving late affects you and other patients whose appointments are after yours.  Also, if you no show three or more times for appointments you may be dismissed from the clinic at the providers discretion.     Again, thank you for choosing [facility].  Our hope is that these requests will decrease the amount of time that you wait before being seen by our physicians.       _____________________________________________________________  Should you have questions after your visit to [facility], please contact our office at [phone] between the hours of 8:30 a.m. and 4:30 p.m.  Voicemails left after 4:30 p.m. will not be returned until the following business day.  For prescription refill requests, have your pharmacy contact our office.       Resources For Cancer Patients and their Caregivers ? American Cancer Society: Can assist with transportation, wigs, general needs, runs Look Good Feel Better.        480-295-7436 ? Cancer Care: Provides financial assistance, online support groups, medication/co-pay assistance.  1-800-813-HOPE 646-641-8377) ? [place] Assists [place] cancer patients and their families through emotional , educational and financial support.  (442)243-3578 ? [place] DSS Where to apply for food stamps, Medicaid and utility assistance. 215-032-5914 ? RCATS: Transportation to medical appointments. (779) 401-9001 ? Social Security Administration: May apply for disability if have a Stage IV cancer. 312-208-6676 660-384-0271 ? [employer], Disability and Transit Services: Assists with nutrition, care and transit needs. [place] Support Programs: @10RELATIVEDAYS @ > Cancer Support Group  2nd Tuesday of the month 1pm-2pm, Journey Room  > Creative Journey  3rd Tuesday of the month 1130am-1pm, Journey Room  > Look Good Feel Better  1st Wednesday of the month 10am-12 noon, Journey Room (Call [place] to register [phone])

## 2016-03-02 LAB — CEA: CEA: 4.4 ng/mL (ref 0.0–4.7)

## 2016-03-03 ENCOUNTER — Other Ambulatory Visit (HOSPITAL_COMMUNITY): Payer: Self-pay | Admitting: Hematology & Oncology

## 2016-03-03 DIAGNOSIS — D509 Iron deficiency anemia, unspecified: Secondary | ICD-10-CM | POA: Insufficient documentation

## 2016-03-03 DIAGNOSIS — D508 Other iron deficiency anemias: Secondary | ICD-10-CM

## 2016-03-05 ENCOUNTER — Encounter (HOSPITAL_COMMUNITY): Payer: Self-pay | Admitting: Hematology & Oncology

## 2016-03-13 ENCOUNTER — Other Ambulatory Visit: Payer: Self-pay | Admitting: Cardiovascular Disease

## 2016-03-13 ENCOUNTER — Encounter (HOSPITAL_BASED_OUTPATIENT_CLINIC_OR_DEPARTMENT_OTHER): Payer: Commercial Managed Care - HMO

## 2016-03-13 VITALS — BP 147/53 | HR 56 | Temp 98.0°F | Resp 16

## 2016-03-13 DIAGNOSIS — D508 Other iron deficiency anemias: Secondary | ICD-10-CM

## 2016-03-13 MED ORDER — HEPARIN SOD (PORK) LOCK FLUSH 100 UNIT/ML IV SOLN
INTRAVENOUS | Status: AC
Start: 2016-03-13 — End: 2016-03-13
  Filled 2016-03-13: qty 5

## 2016-03-13 MED ORDER — FERRIC CARBOXYMALTOSE 750 MG/15ML IV SOLN
750.0000 mg | Freq: Once | INTRAVENOUS | Status: AC
  Administered 2016-03-13: 750 mg via INTRAVENOUS
  Filled 2016-03-13: qty 15

## 2016-03-13 NOTE — Progress Notes (Signed)
Tolerated iron infusion well. Stable and ambulatory on discharge home with wife. 

## 2016-03-13 NOTE — Patient Instructions (Signed)
[place] at [facility] Discharge Instructions  RECOMMENDATIONS MADE BY THE CONSULTANT AND ANY TEST RESULTS WILL BE SENT TO YOUR REFERRING PHYSICIAN.  You received an iron infusion today. Report any post infusion problems to clinic. Return as scheduled.  Thank you for choosing [place] at [facility] to provide your oncology and hematology care.  To afford each patient quality time with our provider, please arrive at least 15 minutes before your scheduled appointment time.    If you have a lab appointment with the [facility] please come in thru the  Main Entrance and check in at the main information desk  You need to re-schedule your appointment should you arrive 10 or more minutes late.  We strive to give you quality time with our providers, and arriving late affects you and other patients whose appointments are after yours.  Also, if you no show three or more times for appointments you may be dismissed from the clinic at the providers discretion.     Again, thank you for choosing [facility].  Our hope is that these requests will decrease the amount of time that you wait before being seen by our physicians.       _____________________________________________________________  Should you have questions after your visit to [facility], please contact our office at [phone] between the hours of 8:30 a.m. and 4:30 p.m.  Voicemails left after 4:30 p.m. will not be returned until the following business day.  For prescription refill requests, have your pharmacy contact our office.       Resources For Cancer Patients and their Caregivers ? American Cancer Society: Can assist with transportation, wigs, general needs, runs Look Good Feel Better.        3068359405 ? Cancer Care: Provides financial assistance, online support groups, medication/co-pay assistance.  1-800-813-HOPE (613) 562-0633) ? [place] Assists [place] cancer patients and their families through emotional , educational and financial support.  585-632-3342 ? [place] DSS Where to apply for food stamps, Medicaid and utility assistance. 336-742-9383 ? RCATS: Transportation to medical appointments. (509)073-6461 ? Social Security Administration: May apply for disability if have a Stage IV cancer. 801-366-5632 8628162208 ? [employer], Disability and Transit Services: Assists with nutrition, care and transit needs. [place] Support Programs: @10RELATIVEDAYS @ > Cancer Support Group  2nd Tuesday of the month 1pm-2pm, Journey Room  > Creative Journey  3rd Tuesday of the month 1130am-1pm, Journey Room  > Look Good Feel Better  1st Wednesday of the month 10am-12 noon, Journey Room (Call [place] to register [phone])

## 2016-03-14 DIAGNOSIS — C189 Malignant neoplasm of colon, unspecified: Secondary | ICD-10-CM | POA: Diagnosis not present

## 2016-03-14 DIAGNOSIS — Z800 Family history of malignant neoplasm of digestive organs: Secondary | ICD-10-CM | POA: Diagnosis not present

## 2016-03-14 DIAGNOSIS — C163 Malignant neoplasm of pyloric antrum: Secondary | ICD-10-CM | POA: Diagnosis not present

## 2016-04-12 ENCOUNTER — Other Ambulatory Visit: Payer: Self-pay | Admitting: Cardiovascular Disease

## 2016-04-12 NOTE — Telephone Encounter (Signed)
REFILL 

## 2016-04-26 ENCOUNTER — Encounter (HOSPITAL_COMMUNITY): Payer: Commercial Managed Care - HMO | Attending: Hematology & Oncology

## 2016-04-26 VITALS — BP 115/55 | HR 64 | Temp 97.8°F | Resp 18

## 2016-04-26 DIAGNOSIS — Z452 Encounter for adjustment and management of vascular access device: Secondary | ICD-10-CM

## 2016-04-26 DIAGNOSIS — R918 Other nonspecific abnormal finding of lung field: Secondary | ICD-10-CM | POA: Insufficient documentation

## 2016-04-26 DIAGNOSIS — D649 Anemia, unspecified: Secondary | ICD-10-CM | POA: Insufficient documentation

## 2016-04-26 DIAGNOSIS — C169 Malignant neoplasm of stomach, unspecified: Secondary | ICD-10-CM

## 2016-04-26 DIAGNOSIS — C180 Malignant neoplasm of cecum: Secondary | ICD-10-CM

## 2016-04-26 DIAGNOSIS — C189 Malignant neoplasm of colon, unspecified: Secondary | ICD-10-CM | POA: Insufficient documentation

## 2016-04-26 DIAGNOSIS — Z95828 Presence of other vascular implants and grafts: Secondary | ICD-10-CM

## 2016-04-26 MED ORDER — HEPARIN SOD (PORK) LOCK FLUSH 100 UNIT/ML IV SOLN
500.0000 [IU] | Freq: Once | INTRAVENOUS | Status: AC
  Administered 2016-04-26: 500 [IU] via INTRAVENOUS
  Filled 2016-04-26: qty 5

## 2016-04-26 MED ORDER — SODIUM CHLORIDE 0.9% FLUSH
10.0000 mL | Freq: Once | INTRAVENOUS | Status: AC
  Administered 2016-04-26: 10 mL via INTRAVENOUS

## 2016-04-26 NOTE — Progress Notes (Signed)
Steven Drake presented for Portacath access and flush. Proper placement of portacath confirmed by CXR. Portacath located right chest wall accessed with  H 20 needle. Good blood return present. Portacath flushed with 33ml NS and 500U/93ml Heparin and needle removed intact. Procedure without incident. Patient tolerated procedure well.

## 2016-04-26 NOTE — Patient Instructions (Signed)
[facility] at [facility] Discharge Instructions  RECOMMENDATIONS MADE BY THE CONSULTANT AND ANY TEST RESULTS WILL BE SENT TO YOUR REFERRING PHYSICIAN.  Port flush done today as ordered. Return as scheduled.  Thank you for choosing [facility] at [facility] to provide your oncology and hematology care.  To afford each patient quality time with our provider, please arrive at least 15 minutes before your scheduled appointment time.    If you have a lab appointment with the Cancer Center please come in thru the  Main Entrance and check in at the main information desk  You need to re-schedule your appointment should you arrive 10 or more minutes late.  We strive to give you quality time with our providers, and arriving late affects you and other patients whose appointments are after yours.  Also, if you no show three or more times for appointments you may be dismissed from the clinic at the providers discretion.     Again, thank you for choosing [facility].  Our hope is that these requests will decrease the amount of time that you wait before being seen by our physicians.       _____________________________________________________________  Should you have questions after your visit to [facility], please contact our office at [phone] between the hours of 8:30 a.m. and 4:30 p.m.  Voicemails left after 4:30 p.m. will not be returned until the following business day.  For prescription refill requests, have your pharmacy contact our office.       Resources For Cancer Patients and their Caregivers ? American Cancer Society: Can assist with transportation, wigs, general needs, runs Look Good Feel Better.        1-888-227-6333 ? Cancer Care: Provides financial assistance, online support groups, medication/co-pay assistance.  1-800-813-HOPE (4673) ? [facility] Assists [place] cancer  patients and their families through emotional , educational and financial support.  336-427-4357 ? [place] DSS Where to apply for food stamps, Medicaid and utility assistance. 336-342-1394 ? RCATS: Transportation to medical appointments. 336-347-2287 ? Social Security Administration: May apply for disability if have a Stage IV cancer. 336-342-7796 1-800-772-1213 ? [place] Aging, Disability and Transit Services: Assists with nutrition, care and transit needs. 336-349-2343  Cancer Center Support Programs: @10RELATIVEDAYS@ > Cancer Support Group  2nd Tuesday of the month 1pm-2pm, Journey Room  > Creative Journey  3rd Tuesday of the month 1130am-1pm, Journey Room  > Look Good Feel Better  1st Wednesday of the month 10am-12 noon, Journey Room (Call [employer] to register [phone])   

## 2016-05-12 ENCOUNTER — Other Ambulatory Visit: Payer: Self-pay | Admitting: Family Medicine

## 2016-05-22 ENCOUNTER — Encounter: Payer: Self-pay | Admitting: Family Medicine

## 2016-05-22 ENCOUNTER — Ambulatory Visit (INDEPENDENT_AMBULATORY_CARE_PROVIDER_SITE_OTHER): Payer: Medicare HMO | Admitting: Family Medicine

## 2016-05-22 VITALS — BP 95/52 | HR 64 | Temp 98.6°F | Ht 71.0 in | Wt 165.0 lb

## 2016-05-22 DIAGNOSIS — J209 Acute bronchitis, unspecified: Secondary | ICD-10-CM

## 2016-05-22 MED ORDER — HYDROCODONE-HOMATROPINE 5-1.5 MG/5ML PO SYRP: 5.0000 mL | ORAL_SOLUTION | Freq: Four times a day (QID) | ORAL | 0 refills | Status: DC | PRN

## 2016-05-22 MED ORDER — DOXYCYCLINE MONOHYDRATE 100 MG PO TABS: 100.0000 mg | ORAL_TABLET | Freq: Two times a day (BID) | ORAL | 0 refills | Status: DC

## 2016-05-22 NOTE — Progress Notes (Signed)
BP (!) 95/52   Pulse 64   Temp 98.6 F (37 C) (Oral)   Ht 5\' 11"  (1.803 m)   Wt 165 lb (74.8 kg)   BMI 23.01 kg/m    Subjective:    [name], male    DOB: December 03, 1945, 71 y.o.   MRN: [id number]  [name] is a 71 y.o. male presenting on 05/22/2016 for Cough (x 4 days) and Sinusitis (nasal congestion and drainage, pain and headache)   HPI Cough and chest congestion Patient comes in today with complaints of cough and chest congestion and sinus congestion and postnasal drainage has been going on for the past 4 days. He denies any fevers or chills or shortness of breath or wheezing. His cough has been nonproductive but it is bad enough that it is keeping him up at night. He has been using Flonase the past 2 days and does seem to see some benefit from that and he is also taking an antihistamine. He denies any sick contacts that he knows of. He does have a frontal headache along with it.   Relevant past medical, surgical, family and social history reviewed and updated as indicated. Interim medical history since our last visit reviewed. Allergies and medications reviewed and updated.  Review of Systems  Constitutional: Negative for chills and fever.  HENT: Positive for congestion, postnasal drip, rhinorrhea, sinus pressure, sneezing and sore throat. Negative for ear discharge, ear pain and voice change.   Eyes: Negative for pain, discharge, redness and visual disturbance.  Respiratory: Positive for cough and chest tightness. Negative for shortness of breath and wheezing.   Cardiovascular: Negative for chest pain and leg swelling.  Musculoskeletal: Negative for gait problem.  Skin: Negative for rash.  All other systems reviewed and are negative.   Per HPI unless specifically indicated above   Allergies as of 05/22/2016      Reactions   Contrast Media [iodinated Diagnostic Agents] Itching, Rash      Medication List       Accurate as of 05/22/16  2:24 PM.  Always use your most recent med list.          aspirin 81 MG EC tablet Take 81 mg by mouth daily.   atorvastatin 40 MG tablet Commonly known as:  LIPITOR TAKE 1 TABLET BY MOUTH EVERY DAY   clopidogrel 75 MG tablet Commonly known as:  PLAVIX TAKE 1 TABLET BY MOUTH DAILY WITH BREAKFAST   diphenoxylate-atropine 2.5-0.025 MG tablet Commonly known as:  LOMOTIL Take 1-2 tablets by mouth 4 (four) times daily as needed for diarrhea or loose stools.   doxycycline 100 MG tablet Commonly known as:  ADOXA Take 1 tablet (100 mg total) by mouth 2 (two) times daily.   HYDROcodone-homatropine 5-1.5 MG/5ML syrup Commonly known as:  HYCODAN Take 5 mLs by mouth every 6 (six) hours as needed for cough.   lidocaine-prilocaine cream Commonly known as:  EMLA APPLY A QUARTER SIZE AMOUNT TO PORT SITE 1 HOUR PRIOR TO CHEMO. DO NOT RUB IN. COVER WITH PLASTIC WRAP.   lisinopril 5 MG tablet Commonly known as:  PRINIVIL,ZESTRIL TAKE 1 TABLET BY MOUTH EVERY DAY   ondansetron 8 MG tablet Commonly known as:  ZOFRAN Take 1 tablet (8 mg total) by mouth every 8 (eight) hours as needed for nausea or vomiting.   pantoprazole 40 MG tablet Commonly known as:  PROTONIX TAKE 1 TABLET BY MOUTH EVERY DAY AT 6:00 AM   prochlorperazine 10  MG tablet Commonly known as:  COMPAZINE Take 1 tablet (10 mg total) by mouth every 6 (six) hours as needed for nausea or vomiting.          Objective:    BP (!) 95/52   Pulse 64   Temp 98.6 F (37 C) (Oral)   Ht 5\' 11"  (1.803 m)   Wt 165 lb (74.8 kg)   BMI 23.01 kg/m   Wt Readings from Last 3 Encounters:  05/22/16 165 lb (74.8 kg)  03/01/16 158 lb 8 oz (71.9 kg)  01/24/16 156 lb 3.2 oz (70.9 kg)    Physical Exam  Constitutional: He is oriented to person, place, and time. He appears well-developed and well-nourished. No distress.  HENT:  Right Ear: Tympanic membrane, external ear and ear canal normal.  Left Ear: Tympanic membrane, external ear and ear canal  normal.  Nose: Mucosal edema and rhinorrhea present. No sinus tenderness. No epistaxis. Right sinus exhibits frontal sinus tenderness. Right sinus exhibits no maxillary sinus tenderness. Left sinus exhibits frontal sinus tenderness. Left sinus exhibits no maxillary sinus tenderness.  Mouth/Throat: Uvula is midline and mucous membranes are normal. Posterior oropharyngeal edema and posterior oropharyngeal erythema present. No oropharyngeal exudate or tonsillar abscesses.  Eyes: Conjunctivae are normal. No scleral icterus.  Neck: Neck supple. No thyromegaly present.  Cardiovascular: Normal rate, regular rhythm, normal heart sounds and intact distal pulses.   No murmur heard. Pulmonary/Chest: Effort normal and breath sounds normal. No respiratory distress. He has no wheezes. He has no rales.  Musculoskeletal: Normal range of motion. He exhibits no edema.  Lymphadenopathy:    He has no cervical adenopathy.  Neurological: He is alert and oriented to person, place, and time. Coordination normal.  Skin: Skin is warm and dry. No rash noted. He is not diaphoretic.  Psychiatric: He has a normal mood and affect. His behavior is normal.  Nursing note and vitals reviewed.       Assessment & Plan:   Problem List Items Addressed This Visit    None    Visit Diagnoses    Acute bronchitis, unspecified organism    -  Primary   Relevant Medications   doxycycline (ADOXA) 100 MG tablet   HYDROcodone-homatropine (HYCODAN) 5-1.5 MG/5ML syrup       Follow up plan: Return if symptoms worsen or fail to improve.  Counseling provided for all of the vaccine components No orders of the defined types were placed in this encounter.   Caryl Pina, MD [facility] 05/22/2016, 2:24 PM

## 2016-06-02 ENCOUNTER — Encounter (HOSPITAL_COMMUNITY): Payer: Medicare HMO | Attending: Oncology

## 2016-06-02 DIAGNOSIS — R918 Other nonspecific abnormal finding of lung field: Secondary | ICD-10-CM | POA: Diagnosis not present

## 2016-06-02 DIAGNOSIS — I2510 Atherosclerotic heart disease of native coronary artery without angina pectoris: Secondary | ICD-10-CM | POA: Insufficient documentation

## 2016-06-02 DIAGNOSIS — I700 Atherosclerosis of aorta: Secondary | ICD-10-CM | POA: Insufficient documentation

## 2016-06-02 DIAGNOSIS — J439 Emphysema, unspecified: Secondary | ICD-10-CM | POA: Diagnosis not present

## 2016-06-02 DIAGNOSIS — M7989 Other specified soft tissue disorders: Secondary | ICD-10-CM | POA: Insufficient documentation

## 2016-06-02 DIAGNOSIS — I252 Old myocardial infarction: Secondary | ICD-10-CM | POA: Insufficient documentation

## 2016-06-02 DIAGNOSIS — F17210 Nicotine dependence, cigarettes, uncomplicated: Secondary | ICD-10-CM | POA: Diagnosis not present

## 2016-06-02 DIAGNOSIS — Z9049 Acquired absence of other specified parts of digestive tract: Secondary | ICD-10-CM | POA: Diagnosis not present

## 2016-06-02 DIAGNOSIS — Z800 Family history of malignant neoplasm of digestive organs: Secondary | ICD-10-CM | POA: Insufficient documentation

## 2016-06-02 DIAGNOSIS — Z9889 Other specified postprocedural states: Secondary | ICD-10-CM | POA: Diagnosis not present

## 2016-06-02 DIAGNOSIS — Z8711 Personal history of peptic ulcer disease: Secondary | ICD-10-CM | POA: Insufficient documentation

## 2016-06-02 DIAGNOSIS — C19 Malignant neoplasm of rectosigmoid junction: Secondary | ICD-10-CM

## 2016-06-02 DIAGNOSIS — E785 Hyperlipidemia, unspecified: Secondary | ICD-10-CM | POA: Diagnosis not present

## 2016-06-02 DIAGNOSIS — K219 Gastro-esophageal reflux disease without esophagitis: Secondary | ICD-10-CM | POA: Diagnosis not present

## 2016-06-02 DIAGNOSIS — Z7982 Long term (current) use of aspirin: Secondary | ICD-10-CM | POA: Diagnosis not present

## 2016-06-02 DIAGNOSIS — C169 Malignant neoplasm of stomach, unspecified: Secondary | ICD-10-CM | POA: Insufficient documentation

## 2016-06-02 DIAGNOSIS — E876 Hypokalemia: Secondary | ICD-10-CM | POA: Diagnosis not present

## 2016-06-02 DIAGNOSIS — Z903 Acquired absence of stomach [part of]: Secondary | ICD-10-CM | POA: Diagnosis not present

## 2016-06-02 DIAGNOSIS — C180 Malignant neoplasm of cecum: Secondary | ICD-10-CM | POA: Insufficient documentation

## 2016-06-02 DIAGNOSIS — Z452 Encounter for adjustment and management of vascular access device: Secondary | ICD-10-CM | POA: Diagnosis not present

## 2016-06-02 DIAGNOSIS — D509 Iron deficiency anemia, unspecified: Secondary | ICD-10-CM | POA: Diagnosis not present

## 2016-06-02 DIAGNOSIS — I10 Essential (primary) hypertension: Secondary | ICD-10-CM | POA: Diagnosis not present

## 2016-06-02 LAB — CBC WITH DIFFERENTIAL/PLATELET
BASOS ABS: 0 10*3/uL (ref 0.0–0.1)
BASOS PCT: 0 %
Eosinophils Absolute: 0.2 10*3/uL (ref 0.0–0.7)
Eosinophils Relative: 2 %
HEMATOCRIT: 35.6 % — AB (ref 39.0–52.0)
HEMOGLOBIN: 11.8 g/dL — AB (ref 13.0–17.0)
Lymphocytes Relative: 15 %
Lymphs Abs: 1.4 10*3/uL (ref 0.7–4.0)
MCH: 29.6 pg (ref 26.0–34.0)
MCHC: 33.1 g/dL (ref 30.0–36.0)
MCV: 89.4 fL (ref 78.0–100.0)
Monocytes Absolute: 1 10*3/uL (ref 0.1–1.0)
Monocytes Relative: 10 %
NEUTROS ABS: 7.1 10*3/uL (ref 1.7–7.7)
NEUTROS PCT: 73 %
Platelets: 215 10*3/uL (ref 150–400)
RBC: 3.98 MIL/uL — ABNORMAL LOW (ref 4.22–5.81)
RDW: 15.3 % (ref 11.5–15.5)
WBC: 9.7 10*3/uL (ref 4.0–10.5)

## 2016-06-02 LAB — COMPREHENSIVE METABOLIC PANEL
ALBUMIN: 3.2 g/dL — AB (ref 3.5–5.0)
ALK PHOS: 109 U/L (ref 38–126)
ALT: 12 U/L — ABNORMAL LOW (ref 17–63)
AST: 15 U/L (ref 15–41)
Anion gap: 7 (ref 5–15)
BILIRUBIN TOTAL: 0.4 mg/dL (ref 0.3–1.2)
BUN: 18 mg/dL (ref 6–20)
CALCIUM: 8.7 mg/dL — AB (ref 8.9–10.3)
CO2: 25 mmol/L (ref 22–32)
Chloride: 106 mmol/L (ref 101–111)
Creatinine, Ser: 1.06 mg/dL (ref 0.61–1.24)
GFR calc Af Amer: >60 mL/min (ref >60)
GFR calc non Af Amer: >60 mL/min (ref >60)
GLUCOSE: 106 mg/dL — AB (ref 65–99)
POTASSIUM: 4 mmol/L (ref 3.5–5.1)
Sodium: 138 mmol/L (ref 135–145)
TOTAL PROTEIN: 6.5 g/dL (ref 6.5–8.1)

## 2016-06-02 MED ORDER — HEPARIN SOD (PORK) LOCK FLUSH 100 UNIT/ML IV SOLN
INTRAVENOUS | Status: AC
  Filled 2016-06-02: qty 5

## 2016-06-02 MED ORDER — SODIUM CHLORIDE 0.9% FLUSH
10.0000 mL | INTRAVENOUS | Status: DC | PRN
  Administered 2016-06-02: 10 mL via INTRAVENOUS
  Filled 2016-06-02: qty 10

## 2016-06-02 MED ORDER — HEPARIN SOD (PORK) LOCK FLUSH 100 UNIT/ML IV SOLN
500.0000 [IU] | Freq: Once | INTRAVENOUS | Status: AC
  Administered 2016-06-02: 500 [IU] via INTRAVENOUS

## 2016-06-02 NOTE — Progress Notes (Signed)
Steven Drake presented for Portacath access and flush. Portacath located rt chest wall accessed with  H 20 needle. Good blood return present. Portacath flushed with 47ml NS and 500U/55ml Heparin and needle removed intact. Procedure without incident. Patient tolerated procedure well.

## 2016-06-03 LAB — CEA: CEA: 3.7 ng/mL (ref 0.0–4.7)

## 2016-06-06 ENCOUNTER — Ambulatory Visit (HOSPITAL_COMMUNITY)
Admission: RE | Admit: 2016-06-06 | Discharge: 2016-06-06 | Disposition: A | Discharge status: Home / Self Care | Payer: Medicare HMO | Source: Ambulatory Visit | Attending: Hematology & Oncology | Admitting: Hematology & Oncology

## 2016-06-06 DIAGNOSIS — C19 Malignant neoplasm of rectosigmoid junction: Secondary | ICD-10-CM

## 2016-06-06 DIAGNOSIS — C180 Malignant neoplasm of cecum: Secondary | ICD-10-CM

## 2016-06-06 DIAGNOSIS — C169 Malignant neoplasm of stomach, unspecified: Secondary | ICD-10-CM

## 2016-06-07 LAB — FERRITIN

## 2016-06-09 ENCOUNTER — Telehealth (HOSPITAL_COMMUNITY): Payer: Self-pay

## 2016-06-09 ENCOUNTER — Other Ambulatory Visit (HOSPITAL_COMMUNITY): Payer: Self-pay | Admitting: Oncology

## 2016-06-09 DIAGNOSIS — C180 Malignant neoplasm of cecum: Secondary | ICD-10-CM

## 2016-06-09 DIAGNOSIS — C169 Malignant neoplasm of stomach, unspecified: Secondary | ICD-10-CM

## 2016-06-09 DIAGNOSIS — Z91041 Radiographic dye allergy status: Secondary | ICD-10-CM

## 2016-06-09 DIAGNOSIS — C19 Malignant neoplasm of rectosigmoid junction: Secondary | ICD-10-CM

## 2016-06-09 MED ORDER — PREDNISONE 50 MG PO TABS
ORAL_TABLET | ORAL | 0 refills | Status: DC
Start: 2016-06-09 — End: 2016-06-09

## 2016-06-09 MED ORDER — PREDNISONE 50 MG PO TABS
ORAL_TABLET | ORAL | 0 refills | Status: DC
Start: 2016-06-09 — End: 2016-06-22

## 2016-06-09 NOTE — Telephone Encounter (Signed)
Called and spoke to patients friend, Stanton Kidney. Explained that patient needed to take Prednisone 50 mg 13, 7 ,and 1 hour prior to CT. Also Benadryl 50 mg 1 hour prior to CT. Stanton Kidney states that patient already has the prednisone and that he has 15 pills "because the others were too big for him to swallow". I tried to explain that prednisone is a small pill in any dosage but she was adamant that the 15 pills he has is for pre-treatment for his CT. She states he is supposed to take 5 pills each time. I explained to [facility] as long as he got 50 mg of prednisone at 13, 7 ,and 1 hour prior to CT, that he could take what he had. But I was sending the prednisone 50 mg #3 pills no refill to [facility] in case what he had at home wasn't the correct medicine. She verbalized understanding and also states she is going to give him 2 of her benadryl pills because he doesn't have any. Instructed to call cancer center if any questions.

## 2016-06-12 ENCOUNTER — Ambulatory Visit (HOSPITAL_COMMUNITY): Payer: Commercial Managed Care - HMO

## 2016-06-12 ENCOUNTER — Other Ambulatory Visit: Payer: Self-pay | Admitting: Cardiovascular Disease

## 2016-06-13 ENCOUNTER — Telehealth: Payer: Self-pay | Admitting: Family Medicine

## 2016-06-13 ENCOUNTER — Ambulatory Visit (INDEPENDENT_AMBULATORY_CARE_PROVIDER_SITE_OTHER): Payer: Medicare HMO | Admitting: Family Medicine

## 2016-06-13 ENCOUNTER — Encounter: Payer: Self-pay | Admitting: Family Medicine

## 2016-06-13 VITALS — BP 129/74 | HR 77 | Temp 99.8°F | Ht 71.0 in | Wt 160.0 lb

## 2016-06-13 DIAGNOSIS — J40 Bronchitis, not specified as acute or chronic: Secondary | ICD-10-CM | POA: Diagnosis not present

## 2016-06-13 DIAGNOSIS — J329 Chronic sinusitis, unspecified: Secondary | ICD-10-CM

## 2016-06-13 MED ORDER — LEVOFLOXACIN 500 MG PO TABS: 500.0000 mg | ORAL_TABLET | Freq: Every day | ORAL | 0 refills | Status: DC

## 2016-06-13 MED ORDER — BETAMETHASONE SOD PHOS & ACET 6 (3-3) MG/ML IJ SUSP
6.0000 mg | Freq: Once | INTRAMUSCULAR | Status: AC
  Administered 2016-06-13: 6 mg via INTRAMUSCULAR

## 2016-06-13 NOTE — Telephone Encounter (Signed)
Needs to be seen

## 2016-06-13 NOTE — Progress Notes (Signed)
Subjective:  [name], male    DOB: Jul 14, 1945  Age: 71 y.o. MRN: [id number]  CC: Cough (productive); Sinusitis (sinus congestion, pressure, drainage, runny nose); and Chest congestion   HPI Steven Drake presents for Symptoms include congestion, facial pain, nasal congestion, productive cough, mucoid sputum, post nasal drip and sinus pressure. There is no fever, chills, or sweats. Onset of symptoms was a few days ago, gradually worsening since that time.    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   He has a past surgical history that includes Appendectomy; left heart cath (N/A, 01/19/2013); Colonoscopy (N/A, 06/10/2015); Esophagogastroduodenoscopy (N/A, 06/10/2015); Esophagogastroduodenoscopy (N/A, 06/17/2015); Hernia repair (Right); Cataract extraction, bilateral; and Laparoscopic subtotal colectomy (N/A, 08/02/2015).   His family history includes Colon cancer in his father, paternal uncle, and sister; Heart attack in his mother; Stomach cancer in his sister.He reports that he has been smoking Cigarettes.  He started smoking about 53 years ago. He has a 58.00 pack-year smoking history. He has never used smokeless tobacco. He reports that he does not drink alcohol or use drugs.  Current Outpatient Prescriptions on File Prior to  Visit  Medication Sig Dispense Refill  . aspirin 81 MG EC tablet Take 81 mg by mouth daily.  12  . atorvastatin (LIPITOR) 40 MG tablet TAKE 1 TABLET BY MOUTH EVERY DAY 90 tablet 1  . clopidogrel (PLAVIX) 75 MG tablet TAKE 1 TABLET BY MOUTH DAILY WITH BREAKFAST 30 tablet 11  . diphenoxylate-atropine (LOMOTIL) 2.5-0.025 MG tablet TAKE 1 TO 2 TABLETS 4 TIMES DAILY AS NEEDED FOR DIARRHEA 30 tablet 1  . lidocaine-prilocaine (EMLA) cream APPLY A QUARTER SIZE AMOUNT TO PORT SITE 1 HOUR PRIOR TO CHEMO. DO NOT RUB IN. COVER WITH PLASTIC WRAP.  3  . lisinopril (PRINIVIL,ZESTRIL) 5 MG tablet TAKE 1 TABLET BY MOUTH EVERY DAY 90 tablet 0  . ondansetron (ZOFRAN) 8 MG tablet Take 1 tablet (8 mg total) by mouth every 8 (eight) hours as needed for nausea or vomiting. 30 tablet 2  . pantoprazole (PROTONIX) 40 MG tablet TAKE 1 TABLET BY MOUTH EVERY DAY AT 6:00 AM 30 tablet 11  . predniSONE (DELTASONE) 50 MG tablet Take 1 tab 13 hours prior to scan (7 pm evening before), 1 tab 7 hours prior to scan (1 am) and 1 tab 1 hour prior to scan (7 am morning of). 3 tablet 0  . prochlorperazine (COMPAZINE) 10 MG tablet Take 1 tablet (10 mg total) by mouth every 6 (six) hours as needed for nausea or vomiting. 30 tablet 2   No current facility-administered medications on file prior to visit.     ROS Review of Systems  Constitutional: Negative for activity change, appetite change, chills and fever.  HENT: Positive for congestion, postnasal drip, rhinorrhea and sinus pressure. Negative for ear discharge, ear pain, hearing loss, nosebleeds, sneezing and trouble swallowing.   Respiratory: Positive for cough. Negative for chest tightness and shortness of breath.   Cardiovascular: Negative for chest pain and palpitations.  Skin: Negative for rash.    Objective:  BP 129/74   Pulse 77   Temp 99.8 F (37.7 C) (Oral)   Ht 5\' 11"  (1.803 m)   Wt 160 lb (72.6 kg)   BMI 22.32 kg/m   Physical Exam  Constitutional: He appears  well-developed and well-nourished.  HENT:  Head: Normocephalic and atraumatic.  Right Ear: Tympanic membrane and external ear normal. No decreased hearing is noted.  Left Ear: Tympanic membrane and external ear normal. No decreased hearing is noted.  Nose: Mucosal edema present. Right sinus exhibits no frontal sinus tenderness. Left sinus exhibits no frontal sinus tenderness.  Mouth/Throat: No oropharyngeal exudate or posterior oropharyngeal erythema.  Neck: No Brudzinski's sign noted.  Pulmonary/Chest: Effort normal. No respiratory distress. He has wheezes (bronchoalveolar change).  Lymphadenopathy:       Head (right side): No preauricular adenopathy present.       Head (left side): No preauricular adenopathy present.       Right cervical: No superficial cervical adenopathy present.      Left cervical: No superficial cervical adenopathy present.    Assessment & Plan:   Steven Drake was seen today for cough, sinusitis and chest congestion.  Diagnoses and all orders for this visit:  Sinobronchitis -     betamethasone acetate-betamethasone sodium phosphate (CELESTONE) injection 6 mg; Inject 1 mL (6 mg total) into the muscle once.  Other orders -     levofloxacin (LEVAQUIN) 500 MG tablet; Take 1 tablet (500 mg total) by mouth daily.   I have discontinued Steven Drake doxycycline and HYDROcodone-homatropine. I am also having him start on levofloxacin. Additionally, I am having him maintain his ondansetron, prochlorperazine, aspirin, lidocaine-prilocaine, clopidogrel, pantoprazole, lisinopril, predniSONE, diphenoxylate-atropine, and atorvastatin. We will continue to administer betamethasone acetate-betamethasone sodium phosphate.  Meds ordered this encounter  Medications  . levofloxacin (LEVAQUIN) 500 MG tablet    Sig: Take 1 tablet (500 mg total) by mouth daily.    Dispense:  7 tablet    Refill:  0  . betamethasone acetate-betamethasone sodium phosphate (CELESTONE) injection 6 mg      Follow-up: No Follow-up on file.  Claretta Fraise, M.D.

## 2016-06-13 NOTE — Telephone Encounter (Signed)
appt made to be seen  

## 2016-06-13 NOTE — Telephone Encounter (Signed)
Rx(s) sent to pharmacy electronically.  

## 2016-06-13 NOTE — Telephone Encounter (Signed)
Patient complains of cough, nasal congestion, and chest congestion with no fevers for 2-3 weeks.   Both Care givers had the flu 2 weeks ago.  Care giver is requesting a antibiotic and cough medication.  Insurance does not cover liquid cough medication per care giver

## 2016-06-13 NOTE — Telephone Encounter (Signed)
What symptoms do you have? Bad cough, can't sleep, nose is running  How long have you been sick? Two weeks   Have you been seen for this problem? yes  If your provider decides to give you a prescription, which pharmacy would you like for it to be sent to? [facility]   Patient informed that this information will be sent to the clinical staff for review and that they should receive a follow up call.

## 2016-06-16 ENCOUNTER — Ambulatory Visit (HOSPITAL_COMMUNITY): Payer: Medicare HMO

## 2016-06-19 ENCOUNTER — Telehealth: Payer: Self-pay | Admitting: Family Medicine

## 2016-06-19 MED ORDER — HYDROCODONE-HOMATROPINE 5-1.5 MG/5ML PO SYRP: 5.0000 mL | ORAL_SOLUTION | Freq: Four times a day (QID) | ORAL | 0 refills | Status: DC | PRN

## 2016-06-19 MED ORDER — BENZONATATE 100 MG PO CAPS: 100.0000 mg | ORAL_CAPSULE | Freq: Two times a day (BID) | ORAL | 0 refills | Status: DC | PRN

## 2016-06-19 NOTE — Telephone Encounter (Signed)
Caregiver aware of medications and recommendations

## 2016-06-19 NOTE — Telephone Encounter (Signed)
What symptoms do you have? Cough and congestion and runny nose  How long have you been sick? 06/13/16 [facility]  Have you been seen for this problem? 06/13/16  If your provider decides to give you a prescription, which pharmacy would you like for it to be sent to? [facility]   Patient informed that this information will be sent to the clinical staff for review and that they should receive a follow up call.

## 2016-06-19 NOTE — Telephone Encounter (Signed)
Saw St[facility] on 06/13/16 Finished Levaquin on Friday. Started with bad cough not able to sleep last night No fever, runny nose w/ yellow drainage Uses [facility] pharmacy

## 2016-06-20 ENCOUNTER — Ambulatory Visit (HOSPITAL_COMMUNITY)
Admission: RE | Admit: 2016-06-20 | Discharge: 2016-06-20 | Disposition: A | Discharge status: Home / Self Care | Payer: Medicare HMO | Source: Ambulatory Visit | Attending: Hematology & Oncology | Admitting: Hematology & Oncology

## 2016-06-20 DIAGNOSIS — C180 Malignant neoplasm of cecum: Secondary | ICD-10-CM | POA: Diagnosis not present

## 2016-06-20 DIAGNOSIS — N400 Enlarged prostate without lower urinary tract symptoms: Secondary | ICD-10-CM | POA: Diagnosis not present

## 2016-06-20 DIAGNOSIS — Z9049 Acquired absence of other specified parts of digestive tract: Secondary | ICD-10-CM | POA: Insufficient documentation

## 2016-06-20 DIAGNOSIS — R918 Other nonspecific abnormal finding of lung field: Secondary | ICD-10-CM | POA: Insufficient documentation

## 2016-06-20 DIAGNOSIS — C169 Malignant neoplasm of stomach, unspecified: Secondary | ICD-10-CM | POA: Diagnosis not present

## 2016-06-20 DIAGNOSIS — J439 Emphysema, unspecified: Secondary | ICD-10-CM | POA: Insufficient documentation

## 2016-06-20 DIAGNOSIS — C19 Malignant neoplasm of rectosigmoid junction: Secondary | ICD-10-CM | POA: Insufficient documentation

## 2016-06-20 MED ORDER — IOPAMIDOL (ISOVUE-300) INJECTION 61%
100.0000 mL | Freq: Once | INTRAVENOUS | Status: AC | PRN
  Administered 2016-06-20: 100 mL via INTRAVENOUS

## 2016-06-22 ENCOUNTER — Encounter (HOSPITAL_COMMUNITY): Payer: Self-pay

## 2016-06-22 ENCOUNTER — Encounter (HOSPITAL_BASED_OUTPATIENT_CLINIC_OR_DEPARTMENT_OTHER): Payer: Medicare HMO | Admitting: Oncology

## 2016-06-22 VITALS — BP 137/55 | HR 62 | Temp 97.9°F | Resp 20 | Wt 163.4 lb

## 2016-06-22 DIAGNOSIS — C189 Malignant neoplasm of colon, unspecified: Secondary | ICD-10-CM

## 2016-06-22 DIAGNOSIS — C19 Malignant neoplasm of rectosigmoid junction: Secondary | ICD-10-CM

## 2016-06-22 DIAGNOSIS — D509 Iron deficiency anemia, unspecified: Secondary | ICD-10-CM

## 2016-06-22 DIAGNOSIS — C180 Malignant neoplasm of cecum: Secondary | ICD-10-CM | POA: Diagnosis not present

## 2016-06-22 DIAGNOSIS — C169 Malignant neoplasm of stomach, unspecified: Secondary | ICD-10-CM | POA: Diagnosis not present

## 2016-06-22 DIAGNOSIS — Z720 Tobacco use: Secondary | ICD-10-CM

## 2016-06-22 NOTE — Patient Instructions (Signed)
[place] at [facility] Discharge Instructions  RECOMMENDATIONS MADE BY THE CONSULTANT AND ANY TEST RESULTS WILL BE SENT TO YOUR REFERRING PHYSICIAN.  You were seen today by Dr. Twana First Follow up in 3 months with labs We will schedule a CT scan before you come back for follow up   Thank you for choosing [place] at [facility] to provide your oncology and hematology care.  To afford each patient quality time with our provider, please arrive at least 15 minutes before your scheduled appointment time.    If you have a lab appointment with the [facility] please come in thru the  Main Entrance and check in at the main information desk  You need to re-schedule your appointment should you arrive 10 or more minutes late.  We strive to give you quality time with our providers, and arriving late affects you and other patients whose appointments are after yours.  Also, if you no show three or more times for appointments you may be dismissed from the clinic at the providers discretion.     Again, thank you for choosing [facility].  Our hope is that these requests will decrease the amount of time that you wait before being seen by our physicians.       _____________________________________________________________  Should you have questions after your visit to [facility], please contact our office at [phone] between the hours of 8:30 a.m. and 4:30 p.m.  Voicemails left after 4:30 p.m. will not be returned until the following business day.  For prescription refill requests, have your pharmacy contact our office.       Resources For Cancer Patients and their Caregivers ? American Cancer Society: Can assist with transportation, wigs, general needs, runs Look Good Feel Better.        ([phone] [phone] ? Cancer Care: Provides financial assistance, online support groups, medication/co-pay assistance.  1-800-813-HOPE  980-118-0841) ? [place] Assists [place] cancer patients and their families through emotional , educational and financial support.  587-858-8245 ? [place] DSS Where to apply for food stamps, Medicaid and utility assistance. (332)761-7694 ? RCATS: Transportation to medical appointments. (718) 379-8523 ? Social Security Administration: May apply for disability if have a Stage IV cancer. 978-022-5555 (937)269-6725 ? [employer], Disability and Transit Services: Assists with nutrition, care and transit needs. [facility] Support Programs: @10RELATIVEDAYS @ > Cancer Support Group  2nd Tuesday of the month 1pm-2pm, Journey Room  > Creative Journey  3rd Tuesday of the month 1130am-1pm, Journey Room  > Look Good Feel Better  1st Wednesday of the month 10am-12 noon, Journey Room (Call [place] to register [phone])

## 2016-06-22 NOTE — Progress Notes (Signed)
[place]  Progress Note  Patient Care Team: Worthy Rancher, MD as PCP - General (Family Medicine) Daneil Dolin, MD as Consulting Physician (Gastroenterology)  CHIEF COMPLAINTS/PURPOSE OF CONSULTATION:    Gastric adenocarcinoma [facility])   06/17/2015 Procedure    Stomach biopsy of pyloric lesion, Dr. 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      06/28/2015 PET scan    Intensely hypermetabolic cecal mass. No definitive evidence of metastatic disease. Scattered upper lobe predominant pulmonary nodules are too small for PET resolution.       08/02/2015 Procedure    Antral resection of tumor of stomach by Dr. Stark Klein      08/06/2015 Pathology Results    Invasive adenocarcinoma, poorly differentiated, 3.0 cm, extending into the muscularis propria, LVI +, 8/16 positive lymph nodes for metastatic disease, negative margins.      08/06/2015 Cancer Staging    pT2N3A      09/15/2015 Procedure    Port placed by IR      09/20/2015 - 10/18/2015 Chemotherapy    FOLFOX x 2 cycles      09/29/2015 Treatment Plan Change    5 FU bolus is discontinued      10/07/2015 Survivorship    Genetic Counseling consultation with [name]- Despite our recommendation, Steven Drake did not wish to pursue genetic testing at today's visit. We understand this decision, and remain available to coordinate genetic testing at any time in the future. We; therefore, recommend Steven Drake continue to follow the cancer screening guidelines given by his primary healthcare provider.      10/18/2015 Treatment Plan Change    Patient refuses future chemotherapy treatment       Adenocarcinoma of cecum ([place])   06/10/2015 Procedure    Colonoscopy with Dr. Gala Romney, Cecal neoplasm. large polyp at hepatic flexure. lesion tattooed, large polyp in mid  sigmoid (lesion not completely removed)      06/10/2015 Pathology Results    Cecal mass - adenocarcinoma, polyp at hepatic flexure foci of adenocarcinoma arising from tubulovillous adenoma      06/28/2015 PET scan    Intensely hypermetabolic cecal mass. No definitive evidence of metastatic disease. Scattered upper lobe predominant pulmonary nodules are too small for PET resolution.       08/02/2015 Procedure    Segmental resection of tumor, right, transverse, descending colon by Dr. Stark Klein      08/06/2015 Pathology Results    Invasive adenocarcinoma, 4.3 cm, well-differentiated, extending through the muscularis propria into the pericolonic soft tissue. 2/14 lymph nodes positive for metastatic disease. Surgical margins are negative.      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 Gala Romney, Cecal neoplasm. large polyp at hepatic flexure. lesion tattooed, large polyp in mid sigmoid (lesion not completely removed)      06/10/2015 Pathology Results    Polyp sigmoid - tubulovillous adenoma with at least high grade dysplasia cannot rule out focal adenocarcinoma      06/28/2015 PET scan    Intensely hypermetabolic cecal mass. No definitive evidence of metastatic disease. Scattered upper lobe predominant pulmonary nodules are too small for PET resolution.       08/02/2015 Procedure    Segmental resection for tumor  of rectosigmoid colon by Dr. Stark Klein.      08/06/2015 Pathology Results    Invasive adenocarcinoma, 2.6 cm, well-differentiated, extending through the muscularis propria and into pericolonic soft tissue, 0/10 nodes involved, negative resection margins. No LVI.      08/06/2015 Cancer Staging    pT3N0     06/20/16 CT C/A/P: 1. Four new small solid scattered pulmonary nodules, largest 5 mm in the superior segment left lower lobe, suspicious for pulmonary metastases. Recommend attention on  short-term follow-up chest CT in 3 months. 2. No evidence of metastatic disease in the abdomen or pelvis. 3. Status post distal gastrectomy and subtotal colectomy without complication. 4. Additional findings include aortic atherosclerosis, left main and 3 vessel coronary atherosclerosis, mild emphysema with diffuse bronchial wall thickening suggesting COPD and mildly enlarged prostate.  HISTORY OF PRESENTING ILLNESS:  Steven Drake 71 y.o. male is here for further follow-up of T2N3 Gastric Carcinoma, T3N1B adenocarcinoma of the cecum and T3N0 adenocarcinoma of the rectosigmoid junction.    He is doing well today. He is still working full time on tractors. He still smokes 1 ppd, but that is less than he use to. Pt reports some leg swelling. Denies chest pain, SOB, abdominal pain, diarrhea, constipation, weight loss, loss of appetite, or any other concerns.   MEDICAL HISTORY:  Past Medical History:  Diagnosis Date  . Adenocarcinoma of cecum ([place]) 07/01/2015  . 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 CAD (coronary artery disease) RCA non dominant vessel, LAD 40%, 80-90% 2nd diag EF 55%  01/19/2013   Dr. Claiborne Billings [place] 03-05-15 Epic.  . Family history of colon cancer   . Family history of stomach cancer   . Gastric adenocarcinoma ([place]) 06/29/2015  . Gastric ulcer    many yrs ago  . GERD (gastroesophageal reflux disease)   . Hyperlipidemia LDL goal < 70 01/19/2013  . Hypertension   . Hypokalemia 01/20/2013  . Metabolic syndrome, with mildly elevated HgBA1C 01/20/2013  . Pulmonary nodules 01/19/2013  . STEMI (ST elevation myocardial infarction), 01/19/13 01/19/2013  . Tobacco abuse 01/20/2013  . Transfusion history    with gastric ulcer- many yrs ago    SURGICAL HISTORY: Past Surgical History:  Procedure Laterality Date  . APPENDECTOMY    . CATARACT EXTRACTION, BILATERAL    . COLONOSCOPY N/A 06/10/2015   RMR: cecal neoplasm  ? biopsied. large polyp at the hepatic  flexure removed with piecmeal polypectomy and APC ablation. lesion tattooed. Large polyp in the mid sigmoid status post piecemeal hot snare debulking and tattooing. this lesion not completely removed. scatterd pancolonic diverticulsosis. no speciments collected.   . ESOPHAGOGASTRODUODENOSCOPY N/A 06/10/2015   Procedure: ESOPHAGOGASTRODUODENOSCOPY (EGD);  Surgeon: Daneil Dolin, MD;  Location: [facility] END[facility] SUITE;  Service: Endoscopy;  Laterality: N/A;  . ESOPHAGOGASTRODUODENOSCOPY N/A 06/17/2015   RMR: normal esophagus small hiatal hernia. Abnormal nodular antrum .pyloric channel status post biopsy.   Marland Kitchen HERNIA REPAIR Right   . LAPAROSCOPIC SUBTOTAL COLECTOMY N/A 08/02/2015   Procedure: LAPAROSCOPIC SUBTOTAL COLECTOMY  AND DISTAL GASTRECTOMY ;  Surgeon: Stark Klein, MD;  Location: [facility] ORS;  Service: General;  Laterality: N/A;  . LEFT HEART CATH N/A 01/19/2013   Procedure: LEFT HEART CATH;  Surgeon: Troy Sine, MD;  Location: [facility] CATH LAB;  Service: Cardiovascular;  Laterality: N/A;    SOCIAL HISTORY: Social History   Social History  . Marital status: Widowed    Spouse name: N/A  . Number of children: 0  . Years  of education: N/A   Occupational History  . farmer    Social History Main Topics  . Smoking status: Current Every Day Smoker    Packs/day: 1.00    Years: 58.00    Types: Cigarettes    Start date: 01/21/1963  . Smokeless tobacco: Never Used     Comment: one pack daily  . Alcohol use No     Comment: none in 15 yrs -heavy user-none now.  . Drug use: No  . Sexual activity: Not on file   Other Topics Concern  . Not on file   Social History Narrative  . No narrative on file  Widowed since 2003 Has 2 step kids Still smokes No ETOH use Retired. Used to drive a truck cross country for 45 years.   FAMILY HISTORY: Family History  Problem Relation Age of Onset  . Heart attack Mother   . Colon cancer Father     dx in his 60s  . Colon cancer Sister     dx possibly in her  early 55s  . Stomach cancer Sister     dx in her 79s  . Colon cancer Paternal Uncle   1 brother and 1 sister living Oldest brother died; sister died from stomach cancer; Other sister died of colon cancer Father died of colon cancer at 70 years old. Mother died of a heart attack in her 67s.  ALLERGIES:  is allergic to contrast media [iodinated diagnostic agents].  MEDICATIONS:  Current Outpatient Prescriptions  Medication Sig Dispense Refill  . aspirin 81 MG EC tablet Take 81 mg by mouth daily.  12  . atorvastatin (LIPITOR) 40 MG tablet TAKE 1 TABLET BY MOUTH EVERY DAY 90 tablet 1  . benzonatate (TESSALON) 100 MG capsule Take 1 capsule (100 mg total) by mouth 2 (two) times daily as needed for cough. 20 capsule 0  . clopidogrel (PLAVIX) 75 MG tablet TAKE 1 TABLET BY MOUTH DAILY WITH BREAKFAST 30 tablet 11  . diphenoxylate-atropine (LOMOTIL) 2.5-0.025 MG tablet TAKE 1 TO 2 TABLETS 4 TIMES DAILY AS NEEDED FOR DIARRHEA 30 tablet 1  . lidocaine-prilocaine (EMLA) cream APPLY A QUARTER SIZE AMOUNT TO PORT SITE 1 HOUR PRIOR TO CHEMO. DO NOT RUB IN. COVER WITH PLASTIC WRAP.  3  . lisinopril (PRINIVIL,ZESTRIL) 5 MG tablet TAKE 1 TABLET BY MOUTH EVERY DAY 90 tablet 0  . ondansetron (ZOFRAN) 8 MG tablet Take 1 tablet (8 mg total) by mouth every 8 (eight) hours as needed for nausea or vomiting. 30 tablet 2  . pantoprazole (PROTONIX) 40 MG tablet TAKE 1 TABLET BY MOUTH EVERY DAY AT 6:00 AM 30 tablet 11  . prochlorperazine (COMPAZINE) 10 MG tablet Take 1 tablet (10 mg total) by mouth every 6 (six) hours as needed for nausea or vomiting. 30 tablet 2   No current facility-administered medications for this visit.    Review of Systems  Constitutional: Negative.  Negative for weight loss.       No loss of appetite  HENT: Negative.   Eyes: Negative.   Respiratory: Negative.  Negative for shortness of breath.   Cardiovascular: Positive for leg swelling. Negative for chest pain.  Gastrointestinal:  Negative.  Negative for abdominal pain, constipation and diarrhea.  Genitourinary: Negative.   Musculoskeletal: Negative.   Skin: Negative.   Neurological: Negative.   Endo/Heme/Allergies: Negative.   Psychiatric/Behavioral: Negative.   All other systems reviewed and are negative. 14 point ROS was done and is otherwise as detailed above or in HPI  PHYSICAL EXAMINATION: ECOG PERFORMANCE STATUS: 0 - Asymptomatic  Vitals:   06/22/16 1017  BP: (!) 137/55  Pulse: 62  Resp: 20  Temp: 97.9 F (36.6 C)   Filed Weights   06/22/16 1017  Weight: 163 lb 6.4 oz (74.1 kg)    Physical Exam  Constitutional: He is oriented to person, place, and time and well-developed, well-nourished, and in no distress.  HENT:  Head: Normocephalic and atraumatic.  Nose: Nose normal.  Mouth/Throat: Oropharynx is clear and moist. No oropharyngeal exudate.  Eyes: Conjunctivae and EOM are normal. Pupils are equal, round, and reactive to light. Right eye exhibits no discharge. Left eye exhibits no discharge. No scleral icterus.  Neck: Normal range of motion. Neck supple. No tracheal deviation present. No thyromegaly present.  Cardiovascular: Normal rate, regular rhythm and normal heart sounds.  Exam reveals no gallop and no friction rub.   No murmur heard. Pulmonary/Chest: Effort normal and breath sounds normal. He has no wheezes. He has no rales.  Abdominal: Soft. Bowel sounds are normal. He exhibits no distension and no mass. There is no tenderness. There is no rebound and no guarding.  Musculoskeletal: Normal range of motion. He exhibits no edema.  Lymphadenopathy:    He has no cervical adenopathy.  Neurological: He is alert and oriented to person, place, and time. He has normal reflexes. No cranial nerve deficit. Gait normal. Coordination normal.  Skin: Skin is warm and dry. No rash noted.  Psychiatric: Mood, memory, affect and judgment normal.  Nursing note and vitals reviewed.  LABORATORY DATA:  I have  reviewed the data as listed Lab Results  Component Value Date   WBC 9.7 06/02/2016   HGB 11.8 (L) 06/02/2016   HCT 35.6 (L) 06/02/2016   MCV 89.4 06/02/2016   PLT 215 06/02/2016   CMP     Component Value Date/Time   NA 138 06/02/2016 1330   K 4.0 06/02/2016 1330   CL 106 06/02/2016 1330   CO2 25 06/02/2016 1330   GLUCOSE 106 (H) 06/02/2016 1330   BUN 18 06/02/2016 1330   CREATININE 1.06 06/02/2016 1330   CREATININE 1.32 (H) 04/13/2015 1252   CALCIUM 8.7 (L) 06/02/2016 1330   PROT 6.5 06/02/2016 1330   ALBUMIN 3.2 (L) 06/02/2016 1330   AST 15 06/02/2016 1330   ALT 12 (L) 06/02/2016 1330   ALKPHOS 109 06/02/2016 1330   BILITOT 0.4 06/02/2016 1330   GFRNONAA >60 06/02/2016 1330   GFRAA >60 06/02/2016 1330     RADIOGRAPHIC STUDIES: I have personally reviewed the radiological images as listed and agreed with the findings in the report.  CT CAP w Contrast 06/20/2016 IMPRESSION: 1. Four new small solid scattered pulmonary nodules, largest 5 mm in the superior segment left lower lobe, suspicious for pulmonary metastases. Recommend attention on short-term follow-up chest CT in 3 months. 2. No evidence of metastatic disease in the abdomen or pelvis. 3. Status post distal gastrectomy and subtotal colectomy without complication. 4. Additional findings include aortic atherosclerosis, left main and 3 vessel coronary atherosclerosis, mild emphysema with diffuse bronchial wall thickening suggesting COPD and mildly enlarged prostate.  PATHOLOGY:   ASSESSMENT & PLAN:  Cancer Staging Adenocarcinoma of cecum [facility]) Staging form: Colon and Rectum, AJCC 7th Edition - Pathologic stage from 08/06/2015: Stage IIIB (T3, N1b, cM0) - Signed by Baird Cancer, PA-C on 08/20/2015  Adenocarcinoma of rectosigmoid junction [facility]) Staging form: Colon and Rectum, AJCC 7th Edition - Pathologic stage from 08/06/2015: Stage IIA (T3, N0, cM0) -  Signed by Baird Cancer, PA-C on 08/20/2015  Gastric  adenocarcinoma [facility]) Staging form: Stomach, AJCC 7th Edition - Pathologic stage from 08/06/2015: Stage IIIA (T2, N3a, cM0) - Signed by Baird Cancer, PA-C on 08/20/2015 Anemia Iron deficiency s/p one dose Injectafer Elevated CEA Bilateral Pulmonary nodules Tobacco Abuse  He had iron deficiency prior to surgery and was given one dose of injectafer.Iron levels are pending today. When available he will be notified and if additional iron is needed we will arrange for this.  Gastric adenocarcinoma ([place]) Stage IIIA (T2N3AM0) invasive adenocarcinoma of antrum of stomach with 8/16 lymph nodes involved with metastatic disease having undergone definitive resection by Dr. Barry Dienes on 08/02/2015.  He did not completed adjuvant FOLFOX.   Adenocarcinoma of cecum ([place]) Stage IIIB (T3N1BM0) invasive adenocarcinoma of cecum, S/P resection by Dr. Barry Dienes on 08/02/2015.  Again did not complete adjuvant FOLFOX, continues to follow with GI.   Adenocarcinoma of rectosigmoid junction ([facility]) Stage IIA (T3N0M0) invasive adenocarcinoma of rectosigmoid colon, S/P surgical resection by Dr. Barry Dienes on 08/02/2015.  PLAN: I reviewed the CT CAP scan with the patient in detail. No evidence of recurrence. I have discussed the four new small solid scattered pulmonary nodules, largest 5 mm in the superior segment left lower lobe seen on CT but patient states he's been aware of pulmonary nodules for years now. I will still repeat a CT chest with contrast in 3 months for follow up of these pulm nodules.  Labs reviewed. Tumor marker is low.   He will return for follow up in 3 months.   Orders Placed This Encounter  Procedures  . CT Chest W Contrast    Standing Status:   Future    Standing Expiration Date:   06/22/2017    Order Specific Question:   If indicated for the ordered procedure, I authorize the administration of contrast media per Radiology protocol    Answer:   Yes    Order Specific Question:   Reason for Exam (SYMPTOM   OR DIAGNOSIS REQUIRED)    Answer:   new 4 small pulm nodules seen on last CT chest concerning for mets    Order Specific Question:   Preferred imaging location?    Answer:   [facility]    Order Specific Question:   Radiology Contrast Protocol - do NOT remove file path    Answer:   [email or web address]  . CBC with Differential    Standing Status:   Future    Standing Expiration Date:   06/22/2017  . Comprehensive metabolic panel    Standing Status:   Future    Standing Expiration Date:   06/22/2017  . CEA    Standing Status:   Future    Standing Expiration Date:   06/22/2017   All questions were answered. The patient knows to call the clinic with any problems, questions or concerns.  This document serves as a record of services personally performed by Twana First, MD. It was created on her behalf by Martinique Casey, a trained medical scribe. The creation of this record is based on the scribe's personal observations and the provider's statements to them. This document has been checked and approved by the attending provider.  I have reviewed the above documentation for accuracy and completeness, and I agree with the above.  This note was electronically signed.    Martinique M Casey  06/22/2016 10:37 AM

## 2016-07-14 ENCOUNTER — Encounter (HOSPITAL_COMMUNITY): Payer: Medicare HMO | Attending: Oncology

## 2016-07-14 ENCOUNTER — Encounter (HOSPITAL_COMMUNITY): Payer: Self-pay

## 2016-07-14 DIAGNOSIS — E876 Hypokalemia: Secondary | ICD-10-CM | POA: Insufficient documentation

## 2016-07-14 DIAGNOSIS — J439 Emphysema, unspecified: Secondary | ICD-10-CM | POA: Insufficient documentation

## 2016-07-14 DIAGNOSIS — R918 Other nonspecific abnormal finding of lung field: Secondary | ICD-10-CM | POA: Insufficient documentation

## 2016-07-14 DIAGNOSIS — Z8711 Personal history of peptic ulcer disease: Secondary | ICD-10-CM | POA: Insufficient documentation

## 2016-07-14 DIAGNOSIS — K219 Gastro-esophageal reflux disease without esophagitis: Secondary | ICD-10-CM | POA: Insufficient documentation

## 2016-07-14 DIAGNOSIS — Z452 Encounter for adjustment and management of vascular access device: Secondary | ICD-10-CM

## 2016-07-14 DIAGNOSIS — F17210 Nicotine dependence, cigarettes, uncomplicated: Secondary | ICD-10-CM | POA: Insufficient documentation

## 2016-07-14 DIAGNOSIS — I700 Atherosclerosis of aorta: Secondary | ICD-10-CM | POA: Insufficient documentation

## 2016-07-14 DIAGNOSIS — E785 Hyperlipidemia, unspecified: Secondary | ICD-10-CM | POA: Insufficient documentation

## 2016-07-14 DIAGNOSIS — D509 Iron deficiency anemia, unspecified: Secondary | ICD-10-CM | POA: Insufficient documentation

## 2016-07-14 DIAGNOSIS — C169 Malignant neoplasm of stomach, unspecified: Secondary | ICD-10-CM | POA: Insufficient documentation

## 2016-07-14 DIAGNOSIS — Z800 Family history of malignant neoplasm of digestive organs: Secondary | ICD-10-CM | POA: Insufficient documentation

## 2016-07-14 DIAGNOSIS — C19 Malignant neoplasm of rectosigmoid junction: Secondary | ICD-10-CM | POA: Insufficient documentation

## 2016-07-14 DIAGNOSIS — I252 Old myocardial infarction: Secondary | ICD-10-CM | POA: Insufficient documentation

## 2016-07-14 DIAGNOSIS — Z903 Acquired absence of stomach [part of]: Secondary | ICD-10-CM | POA: Insufficient documentation

## 2016-07-14 DIAGNOSIS — I10 Essential (primary) hypertension: Secondary | ICD-10-CM | POA: Insufficient documentation

## 2016-07-14 DIAGNOSIS — Z9049 Acquired absence of other specified parts of digestive tract: Secondary | ICD-10-CM | POA: Insufficient documentation

## 2016-07-14 DIAGNOSIS — Z9889 Other specified postprocedural states: Secondary | ICD-10-CM | POA: Insufficient documentation

## 2016-07-14 DIAGNOSIS — M7989 Other specified soft tissue disorders: Secondary | ICD-10-CM | POA: Insufficient documentation

## 2016-07-14 DIAGNOSIS — I2510 Atherosclerotic heart disease of native coronary artery without angina pectoris: Secondary | ICD-10-CM | POA: Insufficient documentation

## 2016-07-14 DIAGNOSIS — Z7982 Long term (current) use of aspirin: Secondary | ICD-10-CM | POA: Insufficient documentation

## 2016-07-14 DIAGNOSIS — C180 Malignant neoplasm of cecum: Secondary | ICD-10-CM

## 2016-07-14 MED ORDER — SODIUM CHLORIDE 0.9% FLUSH
10.0000 mL | INTRAVENOUS | Status: DC | PRN
  Administered 2016-07-14: 10 mL via INTRAVENOUS
  Filled 2016-07-14: qty 10

## 2016-07-14 MED ORDER — HEPARIN SOD (PORK) LOCK FLUSH 100 UNIT/ML IV SOLN
500.0000 [IU] | Freq: Once | INTRAVENOUS | Status: AC
  Administered 2016-07-14: 500 [IU] via INTRAVENOUS

## 2016-07-14 NOTE — Patient Instructions (Signed)
[facility] Discharge Instructions  RECOMMENDATIONS MADE BY THE CONSULTANT AND ANY TEST RESULTS WILL BE SENT TO YOUR REFERRING PHYSICIAN.  Port flush done  Follow up as scheduled.  Thank you for choosing [facility] at [facility] to provide your oncology and hematology care.  To afford each patient quality time with our provider, please arrive at least 15 minutes before your scheduled appointment time.    If you have a lab appointment with the Cancer Center please come in thru the  Main Entrance and check in at the main information desk  You need to re-schedule your appointment should you arrive 10 or more minutes late.  We strive to give you quality time with our providers, and arriving late affects you and other patients whose appointments are after yours.  Also, if you no show three or more times for appointments you may be dismissed from the clinic at the providers discretion.     Again, thank you for choosing [facility].  Our hope is that these requests will decrease the amount of time that you wait before being seen by our physicians.       _____________________________________________________________  Should you have questions after your visit to [facility], please contact our office at [phone] between the hours of 8:30 a.m. and 4:30 p.m.  Voicemails left after 4:30 p.m. will not be returned until the following business day.  For prescription refill requests, have your pharmacy contact our office.       Resources For Cancer Patients and their Caregivers ? American Cancer Society: Can assist with transportation, wigs, general needs, runs Look Good Feel Better.        1-888-227-6333 ? Cancer Care: Provides financial assistance, online support groups, medication/co-pay assistance.  1-800-813-HOPE (4673) ? [facility] Assists [place] cancer patients and their  families through emotional , educational and financial support.  336-427-4357 ? [place] DSS Where to apply for food stamps, Medicaid and utility assistance. 336-342-1394 ? [employer]ATS: Transportation to medical appointments. 336-347-2287 ? Social Security Administration: May apply for disability if have a Stage IV cancer. 336-342-7796 1-800-772-1213 ? [place] Aging, Disability and Transit Services: Assists with nutrition, care and transit needs. 336-349-2343  Cancer Center Support Programs: @10RELATIVEDAYS@ > Cancer Support Group  2nd Tuesday of the month 1pm-2pm, Journey Room  > Creative Journey  3rd Tuesday of the month 1130am-1pm, Journey Room  > Look Good Feel Better  1st Wednesday of the month 10am-12 noon, Journey Room (Call [employer] to register 1-800-395-5775)   

## 2016-07-14 NOTE — Progress Notes (Signed)
Steven Drake presented for Portacath access and flush. Portacath located right chest wall accessed with  H 20 needle. Good blood return present. Portacath flushed with 40ml NS and 500U/59ml Heparin and needle removed intact. Procedure without incident. Patient tolerated procedure well. Vitals stable and discharged home from clinic. Follow up as scheduled.

## 2016-07-25 ENCOUNTER — Ambulatory Visit: Payer: Commercial Managed Care - HMO | Admitting: Nurse Practitioner

## 2016-08-14 ENCOUNTER — Other Ambulatory Visit: Payer: Self-pay

## 2016-08-14 ENCOUNTER — Ambulatory Visit (INDEPENDENT_AMBULATORY_CARE_PROVIDER_SITE_OTHER): Payer: Medicare HMO | Admitting: Nurse Practitioner

## 2016-08-14 ENCOUNTER — Encounter: Payer: Self-pay | Admitting: Nurse Practitioner

## 2016-08-14 VITALS — BP 131/69 | HR 62 | Temp 97.5°F | Ht 71.0 in | Wt 157.4 lb

## 2016-08-14 DIAGNOSIS — C189 Malignant neoplasm of colon, unspecified: Secondary | ICD-10-CM | POA: Diagnosis not present

## 2016-08-14 DIAGNOSIS — C169 Malignant neoplasm of stomach, unspecified: Secondary | ICD-10-CM

## 2016-08-14 MED ORDER — PEG 3350-KCL-NA BICARB-NACL 420 G PO SOLR: 4000.0000 mL | ORAL | 0 refills | Status: DC

## 2016-08-14 NOTE — Progress Notes (Signed)
CC'ED TO PCP 

## 2016-08-14 NOTE — Patient Instructions (Signed)
1. We will schedule your procedure for you. 2. Return for follow-up in 6 months, or based on recommendations made after your procedure. 3. Call us if you have any worsening symptoms or problems.

## 2016-08-14 NOTE — Patient Instructions (Signed)
No Pa is needed for Flex Sig. REF# [id number]

## 2016-08-14 NOTE — Progress Notes (Signed)
Referring Provider: Dettinger, Fransisca Kaufmann, MD Primary Care Physician:  Dettinger, Fransisca Kaufmann, MD Primary GI:  Dr. Gala Romney  Chief Complaint  Patient presents with  . colon cancer    f/u, doing ok    HPI:   Steven Drake is a 72 y.o. male who presents For follow-up on colon cancer and gastric cancer. He has a history of endoscopy and colonoscopy completed on 06/17/2015 which found cecal neoplasm, large polyp in the mid sigmoid section with piecemeal removal and tattooing, cecal lesion found to be adenocarcinoma, hepatic lesion focal adenocarcinoma, sigmoid lesion high-grade dysplasia. Pyloric lesion was also found to be positive for invasive, poorly differentiated adenocarcinoma. At his last visit he get to be scheduled for surgery.  Surgery completed on 08/02/2015 include laparoscopic subtotal colectomy and distal gastrectomy (antral resection of tumor of stomach) by Dr. Barry Dienes in [place]. Surgical pathology found invasive adenocarcinoma, poorly differentiated, 3.0 cm, extending into the muscularis propria, 8/16 positive lymph nodes for metastatic disease, negative margins. He underwent chemotherapy and surgical resection of both the antrum and the colon.  Continues to see oncology only last saw 06/22/2016 at which point it was noted CT of the chest abdomen and pelvis completed 06/20/2016 found for small new solid scattered pulmonary nodules suspicious for pulmonary metastasis. Recommended CT in 3 months. At the time of his last visit he was doing well, still smoking but has cut back. The patient noted he has had pulmonary nodules for years now. Tumor marker low on labs. Follow-up in 3 months with oncology.  At his last visit with [name] on 01/24/2016 he was doing well, still working, some loose stools after surgery with noted good appetite. No other GI symptoms. He is due for one year surveillance endoscopic exam (flex sig with TCS prep for remaining distal sigmoid colon and rectum). Follow-up in 6  months.  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 Denies abdominal pain, N/V, hematochezia, melena, unintentional weight loss, fever, chills. Denies chest pain, dyspnea, dizziness, lightheadedness, syncope, near syncope. Still having loose stools. Denies any other upper or lower GI symptoms.  Past Medical History:  Diagnosis Date  . Adenocarcinoma of cecum ([place]) 07/01/2015  . 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 CAD (coronary artery disease) RCA non dominant vessel, LAD 40%, 80-90% 2nd diag EF 55%  01/19/2013   Dr. Claiborne Billings [place] 03-05-15 Epic.  . Family history of colon cancer   . Family history of stomach cancer   . Gastric adenocarcinoma ([place]) 06/29/2015  . Gastric ulcer    many yrs ago  . GERD (gastroesophageal reflux disease)   . Hyperlipidemia LDL goal < 70 01/19/2013  . Hypertension   . Hypokalemia 01/20/2013  . Metabolic syndrome, with mildly elevated HgBA1C 01/20/2013  . Pulmonary nodules 01/19/2013  . STEMI (ST elevation myocardial infarction), 01/19/13 01/19/2013  . Tobacco abuse 01/20/2013  . Transfusion history    with gastric ulcer- many yrs ago    Past Surgical History:  Procedure Laterality Date  . APPENDECTOMY    . CATARACT EXTRACTION, BILATERAL    . COLONOSCOPY N/A 06/10/2015   RMR: cecal neoplasm  ? biopsied. large polyp at the hepatic flexure removed with piecmeal polypectomy and APC ablation. lesion tattooed. Large polyp in the mid sigmoid status post piecemeal hot snare debulking and tattooing. this lesion not completely removed. scatterd pancolonic diverticulsosis. no speciments collected.   . ESOPHAGOGASTRODUODENOSCOPY N/A 06/10/2015  Procedure: ESOPHAGOGASTRODUODENOSCOPY (EGD);  Surgeon: Daneil Dolin, MD;  Location: [facility];  Service: Endoscopy;  Laterality: N/A;  . ESOPHAGOGASTRODUODENOSCOPY N/A 06/17/2015   RMR: normal esophagus small hiatal hernia.  Abnormal nodular antrum .pyloric channel status post biopsy.   Marland Kitchen HERNIA REPAIR Right   . LAPAROSCOPIC SUBTOTAL COLECTOMY N/A 08/02/2015   Procedure: LAPAROSCOPIC SUBTOTAL COLECTOMY  AND DISTAL GASTRECTOMY ;  Surgeon: Stark Klein, MD;  Location: [facility] ORS;  Service: General;  Laterality: N/A;  . LEFT HEART CATH N/A 01/19/2013   Procedure: LEFT HEART CATH;  Surgeon: Troy Sine, MD;  Location: [facility] C[facility] LAB;  Service: Cardiovascular;  Laterality: N/A;    Current Outpatient Prescriptions  Medication Sig Dispense Refill  . aspirin 81 MG EC tablet Take 81 mg by mouth daily.  12  . atorvastatin (LIPITOR) 40 MG tablet TAKE 1 TABLET BY MOUTH EVERY DAY 90 tablet 1  . benzonatate (TESSALON) 100 MG capsule Take 1 capsule (100 mg total) by mouth 2 (two) times daily as needed for cough. 20 capsule 0  . clopidogrel (PLAVIX) 75 MG tablet TAKE 1 TABLET BY MOUTH DAILY WITH BREAKFAST 30 tablet 11  . diphenoxylate-atropine (LOMOTIL) 2.5-0.025 MG tablet TAKE 1 TO 2 TABLETS 4 TIMES DAILY AS NEEDED FOR DIARRHEA 30 tablet 1  . lidocaine-prilocaine (EMLA) cream APPLY A QUARTER SIZE AMOUNT TO PORT SITE 1 HOUR PRIOR TO CHEMO. DO NOT RUB IN. COVER WITH PLASTIC WRAP.  3  . lisinopril (PRINIVIL,ZESTRIL) 5 MG tablet TAKE 1 TABLET BY MOUTH EVERY DAY 90 tablet 0  . ondansetron (ZOFRAN) 8 MG tablet Take 1 tablet (8 mg total) by mouth every 8 (eight) hours as needed for nausea or vomiting. 30 tablet 2  . pantoprazole (PROTONIX) 40 MG tablet TAKE 1 TABLET BY MOUTH EVERY DAY AT 6:00 AM 30 tablet 11  . prochlorperazine (COMPAZINE) 10 MG tablet Take 1 tablet (10 mg total) by mouth every 6 (six) hours as needed for nausea or vomiting. 30 tablet 2   No current facility-administered medications for this visit.     Allergies as of 08/14/2016 - Review Complete 08/14/2016  Allergen Reaction Noted  . Contrast media [iodinated diagnostic agents] Itching and Rash 05/20/2015    Family History  Problem Relation Age of Onset  . Heart  attack Mother   . Colon cancer Father        dx in his 5s  . Colon cancer Sister        dx possibly in her early 67s  . Stomach cancer Sister        dx in her 57s  . Colon cancer Paternal Uncle     Social History   Social History  . Marital status: Widowed    Spouse name: N/A  . Number of children: 0  . Years of education: N/A   Occupational History  . farmer    Social History Main Topics  . Smoking status: Current Every Day Smoker    Packs/day: 1.00    Years: 58.00    Types: Cigarettes    Start date: 01/21/1963  . Smokeless tobacco: Never Used     Comment: one pack daily  . Alcohol use No     Comment: none in 15 yrs -heavy user-none now.  . Drug use: No  . Sexual activity: Not Asked   Other Topics Concern  . None   Social History Narrative  . None    Review of Systems: General: Negative for anorexia, weight loss, fever, chills,  fatigue, weakness. ENT: Negative for hoarseness, difficulty swallowing. CV: Negative for chest pain, angina, palpitations, peripheral edema.  Respiratory: Negative for dyspnea at rest, cough, sputum, wheezing.  GI: See history of present illness. Endo: Negative for unusual weight change.  Heme: Negative for bruising or bleeding. Allergy: Negative for rash or hives.   Physical Exam: BP 131/69   Pulse 62   Temp 97.5 F (36.4 C) (Oral)   Ht 5\' 11"  (1.803 m)   Wt 157 lb 6.4 oz (71.4 kg)   BMI 21.95 kg/m  General:   Alert and oriented. Pleasant and cooperative. Well-nourished and well-developed.  Head:  Normocephalic and atraumatic. Eyes:  Without icterus, sclera clear and conjunctiva pink.  Ears:  Normal auditory acuity. Cardiovascular:  S1, S2 present without murmurs appreciated. Extremities without clubbing or edema. Respiratory:  Clear to auscultation bilaterally. No wheezes, rales, or rhonchi. No distress.  Gastrointestinal:  +BS, soft, non-tender and non-distended. No HSM noted. No guarding or rebound. No masses  appreciated.  Rectal:  Deferred  Musculoskalatal:  Symmetrical without gross deformities. Neurologic:  Alert and oriented x4;  grossly normal neurologically. Psych:  Alert and cooperative. Normal mood and affect. Heme/Lymph/Immune: No excessive bruising noted.    08/14/2016 8:23 AM   Disclaimer: This note was dictated with voice recognition software. Similar sounding words can inadvertently be transcribed and may not be corrected upon review.

## 2016-08-14 NOTE — Assessment & Plan Note (Signed)
The patient has a history of colon cancer which was found on colonoscopy last year. Status post surgical resection with remaining distal sigmoid colon and rectum. Generally asymptomatic from a GI standpoint. He still works full-time. States his energy and appetite are great. He is due for one year repeat colonoscopy. After discussion with Dr. Gala Romney, we'll plan for flexible sigmoidoscopy with a colonoscopy prep. Return for follow-up in 6 months, or sooner based on recommendations made after his procedure.  Proceed with TCS with Dr. Gala Romney in near future: the risks, benefits, and alternatives have been discussed with the patient in detail. The patient states understanding and desires to proceed.  The patient is currently on Plavix. No other anticoagulants, anxiolytics, chronic pain medications, or antidepressants. Typically no indication to stop Plavix prior to low risk endoscopic procedure. History of alcohol use approximately 15 years ago. Denies drug use. Conscious sedation should be adequate for his procedure as it was for his last.

## 2016-08-14 NOTE — Assessment & Plan Note (Signed)
The patient has a history of gastric antrum cancer status post surgical resection and chemotherapy, which she stopped part way through. He declined further chemotherapy. Declined genetic testing. Today he is doing well, no significant changes, no change in energy or red flag/warning signs or symptoms. Overall asymptomatic from a GI standpoint. Continue to monitor, continue to follow-up with oncology. Return for follow-up in 6 months.

## 2016-09-11 ENCOUNTER — Other Ambulatory Visit: Payer: Self-pay | Admitting: Family

## 2016-09-15 ENCOUNTER — Ambulatory Visit (HOSPITAL_COMMUNITY)
Admission: RE | Admit: 2016-09-15 | Discharge: 2016-09-15 | Disposition: A | Discharge status: Home / Self Care | Payer: Medicare HMO | Source: Ambulatory Visit | Attending: Family Medicine | Admitting: Family Medicine

## 2016-09-15 ENCOUNTER — Other Ambulatory Visit (HOSPITAL_COMMUNITY): Payer: Self-pay | Admitting: Oncology

## 2016-09-15 DIAGNOSIS — R918 Other nonspecific abnormal finding of lung field: Secondary | ICD-10-CM | POA: Diagnosis not present

## 2016-09-15 DIAGNOSIS — J439 Emphysema, unspecified: Secondary | ICD-10-CM | POA: Insufficient documentation

## 2016-09-15 DIAGNOSIS — I2510 Atherosclerotic heart disease of native coronary artery without angina pectoris: Secondary | ICD-10-CM | POA: Diagnosis not present

## 2016-09-15 DIAGNOSIS — I700 Atherosclerosis of aorta: Secondary | ICD-10-CM | POA: Diagnosis not present

## 2016-09-15 DIAGNOSIS — C169 Malignant neoplasm of stomach, unspecified: Secondary | ICD-10-CM

## 2016-09-15 DIAGNOSIS — C189 Malignant neoplasm of colon, unspecified: Secondary | ICD-10-CM

## 2016-09-21 ENCOUNTER — Encounter (HOSPITAL_COMMUNITY): Payer: Medicare HMO

## 2016-09-21 ENCOUNTER — Encounter (HOSPITAL_COMMUNITY): Payer: Self-pay

## 2016-09-21 ENCOUNTER — Encounter (HOSPITAL_COMMUNITY): Payer: Medicare HMO | Attending: Oncology | Admitting: Oncology

## 2016-09-21 VITALS — BP 119/51 | HR 58 | Temp 97.8°F | Resp 18 | Wt 163.2 lb

## 2016-09-21 DIAGNOSIS — M7989 Other specified soft tissue disorders: Secondary | ICD-10-CM | POA: Diagnosis not present

## 2016-09-21 DIAGNOSIS — C19 Malignant neoplasm of rectosigmoid junction: Secondary | ICD-10-CM | POA: Diagnosis not present

## 2016-09-21 DIAGNOSIS — E785 Hyperlipidemia, unspecified: Secondary | ICD-10-CM | POA: Insufficient documentation

## 2016-09-21 DIAGNOSIS — Z800 Family history of malignant neoplasm of digestive organs: Secondary | ICD-10-CM | POA: Insufficient documentation

## 2016-09-21 DIAGNOSIS — C169 Malignant neoplasm of stomach, unspecified: Secondary | ICD-10-CM

## 2016-09-21 DIAGNOSIS — I252 Old myocardial infarction: Secondary | ICD-10-CM | POA: Diagnosis not present

## 2016-09-21 DIAGNOSIS — I700 Atherosclerosis of aorta: Secondary | ICD-10-CM | POA: Insufficient documentation

## 2016-09-21 DIAGNOSIS — F17210 Nicotine dependence, cigarettes, uncomplicated: Secondary | ICD-10-CM | POA: Insufficient documentation

## 2016-09-21 DIAGNOSIS — Z720 Tobacco use: Secondary | ICD-10-CM

## 2016-09-21 DIAGNOSIS — R911 Solitary pulmonary nodule: Secondary | ICD-10-CM

## 2016-09-21 DIAGNOSIS — Z903 Acquired absence of stomach [part of]: Secondary | ICD-10-CM | POA: Diagnosis not present

## 2016-09-21 DIAGNOSIS — Z95828 Presence of other vascular implants and grafts: Secondary | ICD-10-CM

## 2016-09-21 DIAGNOSIS — C180 Malignant neoplasm of cecum: Secondary | ICD-10-CM

## 2016-09-21 DIAGNOSIS — Z7982 Long term (current) use of aspirin: Secondary | ICD-10-CM | POA: Diagnosis not present

## 2016-09-21 DIAGNOSIS — J439 Emphysema, unspecified: Secondary | ICD-10-CM | POA: Insufficient documentation

## 2016-09-21 DIAGNOSIS — I2510 Atherosclerotic heart disease of native coronary artery without angina pectoris: Secondary | ICD-10-CM | POA: Insufficient documentation

## 2016-09-21 DIAGNOSIS — E876 Hypokalemia: Secondary | ICD-10-CM | POA: Diagnosis not present

## 2016-09-21 DIAGNOSIS — E611 Iron deficiency: Secondary | ICD-10-CM

## 2016-09-21 DIAGNOSIS — Z8711 Personal history of peptic ulcer disease: Secondary | ICD-10-CM | POA: Insufficient documentation

## 2016-09-21 DIAGNOSIS — D509 Iron deficiency anemia, unspecified: Secondary | ICD-10-CM | POA: Diagnosis not present

## 2016-09-21 DIAGNOSIS — I10 Essential (primary) hypertension: Secondary | ICD-10-CM | POA: Insufficient documentation

## 2016-09-21 DIAGNOSIS — K219 Gastro-esophageal reflux disease without esophagitis: Secondary | ICD-10-CM | POA: Insufficient documentation

## 2016-09-21 DIAGNOSIS — Z9889 Other specified postprocedural states: Secondary | ICD-10-CM | POA: Insufficient documentation

## 2016-09-21 DIAGNOSIS — C189 Malignant neoplasm of colon, unspecified: Secondary | ICD-10-CM

## 2016-09-21 DIAGNOSIS — Z9049 Acquired absence of other specified parts of digestive tract: Secondary | ICD-10-CM | POA: Diagnosis not present

## 2016-09-21 DIAGNOSIS — R918 Other nonspecific abnormal finding of lung field: Secondary | ICD-10-CM | POA: Insufficient documentation

## 2016-09-21 LAB — COMPREHENSIVE METABOLIC PANEL
ALBUMIN: 3.4 g/dL — AB (ref 3.5–5.0)
ALT: 10 U/L — ABNORMAL LOW (ref 17–63)
AST: 14 U/L — AB (ref 15–41)
Alkaline Phosphatase: 110 U/L (ref 38–126)
Anion gap: 6 (ref 5–15)
BUN: 11 mg/dL (ref 6–20)
CHLORIDE: 106 mmol/L (ref 101–111)
CO2: 25 mmol/L (ref 22–32)
Calcium: 8.8 mg/dL — ABNORMAL LOW (ref 8.9–10.3)
Creatinine, Ser: 1.08 mg/dL (ref 0.61–1.24)
GFR calc Af Amer: >60 mL/min (ref >60)
GLUCOSE: 122 mg/dL — AB (ref 65–99)
POTASSIUM: 3.9 mmol/L (ref 3.5–5.1)
Sodium: 137 mmol/L (ref 135–145)
Total Bilirubin: 0.6 mg/dL (ref 0.3–1.2)
Total Protein: 6.7 g/dL (ref 6.5–8.1)

## 2016-09-21 LAB — CBC WITH DIFFERENTIAL/PLATELET
BASOS ABS: 0 10*3/uL (ref 0.0–0.1)
BASOS PCT: 0 %
EOS PCT: 5 %
Eosinophils Absolute: 0.4 10*3/uL (ref 0.0–0.7)
HCT: 36.9 % — ABNORMAL LOW (ref 39.0–52.0)
Hemoglobin: 12.2 g/dL — ABNORMAL LOW (ref 13.0–17.0)
Lymphocytes Relative: 24 %
Lymphs Abs: 1.7 10*3/uL (ref 0.7–4.0)
MCH: 30.7 pg (ref 26.0–34.0)
MCHC: 33.1 g/dL (ref 30.0–36.0)
MCV: 92.7 fL (ref 78.0–100.0)
MONO ABS: 0.6 10*3/uL (ref 0.1–1.0)
Monocytes Relative: 8 %
Neutro Abs: 4.5 10*3/uL (ref 1.7–7.7)
Neutrophils Relative %: 63 %
PLATELETS: 187 10*3/uL (ref 150–400)
RBC: 3.98 MIL/uL — ABNORMAL LOW (ref 4.22–5.81)
RDW: 14.2 % (ref 11.5–15.5)
WBC: 7.2 10*3/uL (ref 4.0–10.5)

## 2016-09-21 MED ORDER — HEPARIN SOD (PORK) LOCK FLUSH 100 UNIT/ML IV SOLN
500.0000 [IU] | Freq: Once | INTRAVENOUS | Status: AC
  Administered 2016-09-21: 500 [IU] via INTRAVENOUS

## 2016-09-21 MED ORDER — SODIUM CHLORIDE 0.9% FLUSH
10.0000 mL | INTRAVENOUS | Status: DC | PRN
  Administered 2016-09-21: 10 mL via INTRAVENOUS
  Filled 2016-09-21: qty 10

## 2016-09-21 NOTE — Patient Instructions (Signed)
[facility] Discharge Instructions  RECOMMENDATIONS MADE BY THE CONSULTANT AND ANY TEST RESULTS WILL BE SENT TO YOUR REFERRING PHYSICIAN.  You saw Dr. Zhou today.  Thank you for choosing [facility] at [facility] to provide your oncology and hematology care.  To afford each patient quality time with our provider, please arrive at least 15 minutes before your scheduled appointment time.    If you have a lab appointment with the Cancer Center please come in thru the  Main Entrance and check in at the main information desk  You need to re-schedule your appointment should you arrive 10 or more minutes late.  We strive to give you quality time with our providers, and arriving late affects you and other patients whose appointments are after yours.  Also, if you no show three or more times for appointments you may be dismissed from the clinic at the providers discretion.     Again, thank you for choosing [facility].  Our hope is that these requests will decrease the amount of time that you wait before being seen by our physicians.       _____________________________________________________________  Should you have questions after your visit to [facility], please contact our office at [phone] between the hours of 8:30 a.m. and 4:30 p.m.  Voicemails left after 4:30 p.m. will not be returned until the following business day.  For prescription refill requests, have your pharmacy contact our office.       Resources For Cancer Patients and their Caregivers ? American Cancer Society: Can assist with transportation, wigs, general needs, runs Look Good Feel Better.        1-888-227-6333 ? Cancer Care: Provides financial assistance, online support groups, medication/co-pay assistance.  1-800-813-HOPE (4673) ? [facility] Assists [place] cancer patients and their families through  emotional , educational and financial support.  336-427-4357 ? [place] DSS Where to apply for food stamps, Medicaid and utility assistance. 336-342-1394 ? [facility]: Transportation to medical appointments. 336-347-2287 ? Social Security Administration: May apply for disability if have a Stage IV cancer. 336-342-7796 1-800-772-1213 ? [place] Aging, Disability and Transit Services: Assists with nutrition, care and transit needs. 336-349-2343  Cancer Center Support Programs: @10RELATIVEDAYS@ > Cancer Support Group  2nd Tuesday of the month 1pm-2pm, Journey Room  > Creative Journey  3rd Tuesday of the month 1130am-1pm, Journey Room  > Look Good Feel Better  1st Wednesday of the month 10am-12 noon, Journey Room (Call American Cancer Society to register 1-800-395-5775)    

## 2016-09-21 NOTE — Progress Notes (Signed)
[facility]  Progress Note  Patient Care Team: Dettinger, Fransisca Kaufmann, MD as PCP - General (Family Medicine) Gala Romney Cristopher Estimable, MD as Consulting Physician (Gastroenterology)  CHIEF COMPLAINTS/PURPOSE OF CONSULTATION:    Gastric adenocarcinoma [facility])   06/17/2015 Procedure    Stomach biopsy of pyloric lesion, Dr. 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      06/28/2015 PET scan    Intensely hypermetabolic cecal mass. No definitive evidence of metastatic disease. Scattered upper lobe predominant pulmonary nodules are too small for PET resolution.       08/02/2015 Procedure    Antral resection of tumor of stomach by Dr. Stark Klein      08/06/2015 Pathology Results    Invasive adenocarcinoma, poorly differentiated, 3.0 cm, extending into the muscularis propria, LVI +, 8/16 positive lymph nodes for metastatic disease, negative margins.      08/06/2015 Cancer Staging    pT2N3A      09/15/2015 Procedure    Port placed by IR      09/20/2015 - 10/18/2015 Chemotherapy    FOLFOX x 2 cycles      09/29/2015 Treatment Plan Change    5 FU bolus is discontinued      10/07/2015 Survivorship    Genetic Counseling consultation with [name]- Despite our recommendation, [name] did not wish to pursue genetic testing at today's visit. We understand this decision, and remain available to coordinate genetic testing at any time in the future. We; therefore, recommend [name] continue to follow the cancer screening guidelines given by his primary healthcare provider.      10/18/2015 Treatment Plan Change    Patient refuses future chemotherapy treatment      09/15/2016 Imaging    CT chest w/o contrast: IMPRESSION: 1. While several of the previously noted new pulmonary nodules have resolved compared to the prior study,  indicative of benign nodules, there are 2 enlarging nodules. One of these in the left lower lobe has increased from 5 mm to 8 mm in size. The largest nodule (visible in retrospect on the prior study) has also increased in size and currently measures 10 x 8 x 13 mm in the medial aspect of the right middle lobe (axial image 96 of series 4). These findings are suspicious for metastatic disease, although either of these lesions could alternatively represent a primary bronchogenic malignancy given the background of smoking related changes in the lungs. Further evaluation with PET-CT is recommended at this time. 2. Mild diffuse bronchial thickening with mild to moderate centrilobular and paraseptal emphysema; imaging findings suggestive of underlying COPD. 3. Aortic atherosclerosis, in addition to left main and 3 vessel coronary artery disease. Assessment for potential risk factor modification, dietary therapy or pharmacologic therapy may be warranted, if clinically indicated. 4. Additional incidental findings, as above.       Adenocarcinoma of cecum ([place])   06/10/2015 Procedure    Colonoscopy with Dr. Gala Romney, Cecal neoplasm. large polyp at hepatic flexure. lesion tattooed, large polyp in mid sigmoid (lesion not completely removed)      06/10/2015 Pathology Results    Cecal mass - adenocarcinoma, polyp at hepatic flexure foci of adenocarcinoma arising from tubulovillous adenoma      06/28/2015 PET scan    Intensely hypermetabolic cecal mass. No definitive evidence of metastatic disease. Scattered upper lobe predominant pulmonary nodules are  too small for PET resolution.       08/02/2015 Procedure    Segmental resection of tumor, right, transverse, descending colon by Dr. Stark Klein      08/06/2015 Pathology Results    Invasive adenocarcinoma, 4.3 cm, well-differentiated, extending through the muscularis propria into the pericolonic soft tissue. 2/14 lymph nodes positive for metastatic  disease. Surgical margins are negative.      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 Gala Romney, Cecal neoplasm. large polyp at hepatic flexure. lesion tattooed, large polyp in mid sigmoid (lesion not completely removed)      06/10/2015 Pathology Results    Polyp sigmoid - tubulovillous adenoma with at least high grade dysplasia cannot rule out focal adenocarcinoma      06/28/2015 PET scan    Intensely hypermetabolic cecal mass. No definitive evidence of metastatic disease. Scattered upper lobe predominant pulmonary nodules are too small for PET resolution.       08/02/2015 Procedure    Segmental resection for tumor of rectosigmoid colon by Dr. Stark Klein.      08/06/2015 Pathology Results    Invasive adenocarcinoma, 2.6 cm, well-differentiated, extending through the muscularis propria and into pericolonic soft tissue, 0/10 nodes involved, negative resection margins. No LVI.      08/06/2015 Cancer Staging    pT3N0     06/20/16 CT C/A/P: 1. Four new small solid scattered pulmonary nodules, largest 5 mm in the superior segment left lower lobe, suspicious for pulmonary metastases. Recommend attention on short-term follow-up chest CT in 3 months. 2. No evidence of metastatic disease in the abdomen or pelvis. 3. Status post distal gastrectomy and subtotal colectomy without complication. 4. Additional findings include aortic atherosclerosis, left main and 3 vessel coronary atherosclerosis, mild emphysema with diffuse bronchial wall thickening suggesting COPD and mildly enlarged prostate.  HISTORY OF PRESENTING ILLNESS:  Steven Drake 71 y.o. male is here for further follow-up of T2N3 Gastric Carcinoma, T3N1B adenocarcinoma of the cecum and T3N0 adenocarcinoma of the rectosigmoid junction.    He is doing well today. Patient states that he has been doing well. His activity level and energy level  is the same. He is still working full time on tractors. He continues to smoke 1 pack per day.. Denies chest pain, SOB, abdominal pain, diarrhea, constipation, weight loss, loss of appetite, or any other concerns.   MEDICAL HISTORY:  Past Medical History:  Diagnosis Date  . Adenocarcinoma of cecum ([place]) 07/01/2015  . 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 CAD (coronary artery disease) RCA non dominant vessel, LAD 40%, 80-90% 2nd diag EF 55%  01/19/2013   Dr. Claiborne Billings [place] 03-05-15 Epic.  . Family history of colon cancer   . Family history of stomach cancer   . Gastric adenocarcinoma ([place]) 06/29/2015  . Gastric ulcer    many yrs ago  . GERD (gastroesophageal reflux disease)   . Hyperlipidemia LDL goal < 70 01/19/2013  . Hypertension   . Hypokalemia 01/20/2013  . Metabolic syndrome, with mildly elevated HgBA1C 01/20/2013  . Pulmonary nodules 01/19/2013  . STEMI (ST elevation myocardial infarction), 01/19/13 01/19/2013  . Tobacco abuse 01/20/2013  . Transfusion history    with gastric ulcer- many yrs ago    SURGICAL HISTORY: Past Surgical History:  Procedure Laterality Date  . APPENDECTOMY    . CATARACT EXTRACTION, BILATERAL    . COLONOSCOPY N/A  06/10/2015   RMR: cecal neoplasm  ? biopsied. large polyp at the hepatic flexure removed with piecmeal polypectomy and APC ablation. lesion tattooed. Large polyp in the mid sigmoid status post piecemeal hot snare debulking and tattooing. this lesion not completely removed. scatterd pancolonic diverticulsosis. no speciments collected.   . ESOPHAGOGASTRODUODENOSCOPY N/A 06/10/2015   Procedure: ESOPHAGOGASTRODUODENOSCOPY (EGD);  Surgeon: Daneil Dolin, MD;  Location: [facility];  Service: Endoscopy;  Laterality: N/A;  . ESOPHAGOGASTRODUODENOSCOPY N/A 06/17/2015   RMR: normal esophagus small hiatal hernia. Abnormal nodular antrum .pyloric channel status post biopsy.   Marland Kitchen HERNIA REPAIR Right   . LAPAROSCOPIC SUBTOTAL COLECTOMY N/A  08/02/2015   Procedure: LAPAROSCOPIC SUBTOTAL COLECTOMY  AND DISTAL GASTRECTOMY ;  Surgeon: Stark Klein, MD;  Location: [facility] ORS;  Service: General;  Laterality: N/A;  . LEFT HEART CATH N/A 01/19/2013   Procedure: LEFT HEART CATH;  Surgeon: Troy Sine, MD;  Location: [facility] CATH LAB;  Service: Cardiovascular;  Laterality: N/A;    SOCIAL HISTORY: Social History   Social History  . Marital status: Widowed    Spouse name: N/A  . Number of children: 0  . Years of education: N/A   Occupational History  . farmer    Social History Main Topics  . Smoking status: Current Every Day Smoker    Packs/day: 1.00    Years: 58.00    Types: Cigarettes    Start date: 01/21/1963  . Smokeless tobacco: Never Used     Comment: one pack daily  . Alcohol use No     Comment: none in 15 yrs -heavy user-none now.  . Drug use: No  . Sexual activity: Not on file   Other Topics Concern  . Not on file   Social History Narrative  . No narrative on file  Widowed since 2003 Has 2 step kids Still smokes No ETOH use Retired. Used to drive a truck cross country for 45 years.   FAMILY HISTORY: Family History  Problem Relation Age of Onset  . Heart attack Mother   . Colon cancer Father        dx in his 5s  . Colon cancer Sister        dx possibly in her early 19s  . Stomach cancer Sister        dx in her 29s  . Colon cancer Paternal Uncle   1 brother and 1 sister living Oldest brother died; sister died from stomach cancer; Other sister died of colon cancer Father died of colon cancer at 34 years old. Mother died of a heart attack in her 96s.  ALLERGIES:  is allergic to contrast media [iodinated diagnostic agents].  MEDICATIONS:  Current Outpatient Prescriptions  Medication Sig Dispense Refill  . aspirin 81 MG EC tablet Take 81 mg by mouth daily.  12  . atorvastatin (LIPITOR) 40 MG tablet TAKE 1 TABLET BY MOUTH EVERY DAY 90 tablet 1  . benzonatate (TESSALON) 100 MG capsule Take 1 capsule (100  mg total) by mouth 2 (two) times daily as needed for cough. 20 capsule 0  . clopidogrel (PLAVIX) 75 MG tablet TAKE 1 TABLET BY MOUTH DAILY WITH BREAKFAST 30 tablet 11  . diphenoxylate-atropine (LOMOTIL) 2.5-0.025 MG tablet TAKE 1 TO 2 TABLETS 4 TIMES DAILY AS NEEDED FOR DIARRHEA 30 tablet 1  . lidocaine-prilocaine (EMLA) cream APPLY A QUARTER SIZE AMOUNT TO PORT SITE 1 HOUR PRIOR TO CHEMO. DO NOT RUB IN. COVER WITH PLASTIC WRAP.  3  . lisinopril (  PRINIVIL,ZESTRIL) 5 MG tablet TAKE 1 TABLET BY MOUTH EVERY DAY 90 tablet 0  . ondansetron (ZOFRAN) 8 MG tablet Take 1 tablet (8 mg total) by mouth every 8 (eight) hours as needed for nausea or vomiting. 30 tablet 2  . pantoprazole (PROTONIX) 40 MG tablet TAKE 1 TABLET BY MOUTH EVERY DAY AT 6:00 AM 30 tablet 11  . polyethylene glycol-electrolytes (TRILYTE) 420 g solution Take 4,000 mLs by mouth as directed. 4000 mL 0  . prochlorperazine (COMPAZINE) 10 MG tablet Take 1 tablet (10 mg total) by mouth every 6 (six) hours as needed for nausea or vomiting. 30 tablet 2   No current facility-administered medications for this visit.    Review of Systems  Constitutional: Negative.  Negative for weight loss.       No loss of appetite  HENT: Negative.   Eyes: Negative.   Respiratory: Negative.  Negative for shortness of breath.   Cardiovascular: Negative for chest pain and leg swelling.  Gastrointestinal: Negative.  Negative for abdominal pain, constipation and diarrhea.  Genitourinary: Negative.   Musculoskeletal: Negative.   Skin: Negative.   Neurological: Negative.   Endo/Heme/Allergies: Negative.   Psychiatric/Behavioral: Negative.   All other systems reviewed and are negative. 14 point ROS was done and is otherwise as detailed above or in HPI  PHYSICAL EXAMINATION: ECOG PERFORMANCE STATUS: 0 - Asymptomatic  Vitals:   09/21/16 1024  BP: (!) 119/51  Pulse: (!) 58  Resp: 18  Temp: 97.8 F (36.6 C)   Filed Weights   09/21/16 1024  Weight: 163  lb 3.2 oz (74 kg)    Physical Exam  Constitutional: He is oriented to person, place, and time and well-developed, well-nourished, and in no distress.  HENT:  Head: Normocephalic and atraumatic.  Nose: Nose normal.  Mouth/Throat: Oropharynx is clear and moist. No oropharyngeal exudate.  Eyes: Pupils are equal, round, and reactive to light. Conjunctivae and EOM are normal. Right eye exhibits no discharge. Left eye exhibits no discharge. No scleral icterus.  Neck: Normal range of motion. Neck supple. No tracheal deviation present. No thyromegaly present.  Cardiovascular: Normal rate, regular rhythm and normal heart sounds.  Exam reveals no gallop and no friction rub.   No murmur heard. Pulmonary/Chest: Effort normal and breath sounds normal. He has no wheezes. He has no rales.  Abdominal: Soft. Bowel sounds are normal. He exhibits no distension and no mass. There is no tenderness. There is no rebound and no guarding.  Musculoskeletal: Normal range of motion. He exhibits no edema.  Lymphadenopathy:    He has no cervical adenopathy.  Neurological: He is alert and oriented to person, place, and time. He has normal reflexes. No cranial nerve deficit. Gait normal. Coordination normal.  Skin: Skin is warm and dry. No rash noted.  Psychiatric: Mood, memory, affect and judgment normal.  Nursing note and vitals reviewed.  LABORATORY DATA:  I have reviewed the data as listed Lab Results  Component Value Date   WBC 9.7 06/02/2016   HGB 11.8 (L) 06/02/2016   HCT 35.6 (L) 06/02/2016   MCV 89.4 06/02/2016   PLT 215 06/02/2016   CMP     Component Value Date/Time   NA 138 06/02/2016 1330   K 4.0 06/02/2016 1330   CL 106 06/02/2016 1330   CO2 25 06/02/2016 1330   GLUCOSE 106 (H) 06/02/2016 1330   BUN 18 06/02/2016 1330   CREATININE 1.06 06/02/2016 1330   CREATININE 1.32 (H) 04/13/2015 1252   CALCIUM 8.7 (  L) 06/02/2016 1330   PROT 6.5 06/02/2016 1330   ALBUMIN 3.2 (L) 06/02/2016 1330   AST  15 06/02/2016 1330   ALT 12 (L) 06/02/2016 1330   ALKPHOS 109 06/02/2016 1330   BILITOT 0.4 06/02/2016 1330   GFRNONAA >60 06/02/2016 1330   GFRAA >60 06/02/2016 1330     RADIOGRAPHIC STUDIES: I have personally reviewed the radiological images as listed and agreed with the findings in the report.  CT CAP w Contrast 06/20/2016 IMPRESSION: 1. Four new small solid scattered pulmonary nodules, largest 5 mm in the superior segment left lower lobe, suspicious for pulmonary metastases. Recommend attention on short-term follow-up chest CT in 3 months. 2. No evidence of metastatic disease in the abdomen or pelvis. 3. Status post distal gastrectomy and subtotal colectomy without complication. 4. Additional findings include aortic atherosclerosis, left main and 3 vessel coronary atherosclerosis, mild emphysema with diffuse bronchial wall thickening suggesting COPD and mildly enlarged prostate.  PATHOLOGY:   ASSESSMENT & PLAN:  Cancer Staging Adenocarcinoma of cecum [facility]) Staging form: Colon and Rectum, AJCC 7th Edition - Pathologic stage from 08/06/2015: Stage IIIB (T3, N1b, cM0) - Signed by Baird Cancer, PA-C on 08/20/2015  Adenocarcinoma of rectosigmoid junction [facility]) Staging form: Colon and Rectum, AJCC 7th Edition - Pathologic stage from 08/06/2015: Stage IIA (T3, N0, cM0) - Signed by Baird Cancer, PA-C on 08/20/2015  Gastric adenocarcinoma [facility]) Staging form: Stomach, AJCC 7th Edition - Pathologic stage from 08/06/2015: Stage IIIA (T2, N3a, cM0) - Signed by Baird Cancer, PA-C on 08/20/2015 Anemia Iron deficiency s/p one dose Injectafer Elevated CEA Bilateral Pulmonary nodules Tobacco Abuse  He had iron deficiency prior to surgery and was given one dose of injectafer.Iron levels are pending today. When available he will be notified and if additional iron is needed we will arrange for this.  Gastric adenocarcinoma ([place]) Stage IIIA (T2N3AM0) invasive adenocarcinoma of  antrum of stomach with 8/16 lymph nodes involved with metastatic disease having undergone definitive resection by Dr. Barry Dienes on 08/02/2015.  He did not completed adjuvant FOLFOX.   Adenocarcinoma of cecum ([place]) Stage IIIB (T3N1BM0) invasive adenocarcinoma of cecum, S/P resection by Dr. Barry Dienes on 08/02/2015.  Again did not complete adjuvant FOLFOX, continues to follow with GI.   Adenocarcinoma of rectosigmoid junction ([facility]) Stage IIA (T3N0M0) invasive adenocarcinoma of rectosigmoid colon, S/P surgical resection by Dr. Barry Dienes on 08/02/2015.  PLAN: I have reviewed the CT CAP scan with the patient in detail. 2 out of his previous 4 new pulmonary nodules seen on the last scan have resolved. However 2 of them are growing in size. Therefore I will get a PET/CT for continued evaluation on the next visit to see if these 2 nodules are hypermetabolic active. If they are active and have enlarged, then I will potentially send him for SBRT of these 2 lung masses.   He will return for follow up in 3 months.   Orders Placed This Encounter  Procedures  . NM PET Image Restag (PS) Skull Base To Thigh    Standing Status:   Future    Standing Expiration Date:   09/21/2017    Order Specific Question:   Reason for Exam (SYMPTOM  OR DIAGNOSIS REQUIRED)    Answer:   patient with gastric cancer and colon cancer, now with 2 enlarging pulmonary nodules on surveillance CT scan, need to assess hypermetabolic activity on PET    Order Specific Question:   If indicated for the ordered procedure, I authorize the administration  of a radiopharmaceutical per Radiology protocol    Answer:   Yes    Order Specific Question:   Preferred imaging location?    Answer:   [facility]    Order Specific Question:   Radiology Contrast Protocol - do NOT remove file path    Answer:   [email or web address]   All questions were answered. The patient knows to call the clinic with any problems, questions or  concerns.    This note was electronically signed.    Twana First, MD  09/21/2016 10:29 AM

## 2016-09-21 NOTE — Progress Notes (Signed)
Miklos L Sardina presented for Portacath access and flush.  Portacath located right chest wall accessed with  H 20 needle.  Good blood return present. Portacath flushed with 20ml NS and 500U/5ml Heparin and needle removed intact.  Procedure tolerated well and without incident.  Discharged ambulatory.  

## 2016-09-22 LAB — CEA: CEA: 6.1 ng/mL — ABNORMAL HIGH (ref 0.0–4.7)

## 2016-10-05 ENCOUNTER — Ambulatory Visit (HOSPITAL_COMMUNITY)
Admission: RE | Admit: 2016-10-05 | Discharge: 2016-10-05 | Disposition: A | Discharge status: Home / Self Care | Payer: Medicare HMO | Source: Ambulatory Visit | Attending: Internal Medicine | Admitting: Internal Medicine

## 2016-10-05 ENCOUNTER — Encounter (HOSPITAL_COMMUNITY)
Admission: RE | Disposition: A | Discharge status: Home / Self Care | Payer: Self-pay | Source: Ambulatory Visit | Attending: Internal Medicine

## 2016-10-05 ENCOUNTER — Encounter (HOSPITAL_COMMUNITY): Payer: Self-pay | Admitting: *Deleted

## 2016-10-05 DIAGNOSIS — Z9889 Other specified postprocedural states: Secondary | ICD-10-CM | POA: Insufficient documentation

## 2016-10-05 DIAGNOSIS — I252 Old myocardial infarction: Secondary | ICD-10-CM | POA: Diagnosis not present

## 2016-10-05 DIAGNOSIS — Z7902 Long term (current) use of antithrombotics/antiplatelets: Secondary | ICD-10-CM | POA: Diagnosis not present

## 2016-10-05 DIAGNOSIS — Z8249 Family history of ischemic heart disease and other diseases of the circulatory system: Secondary | ICD-10-CM | POA: Diagnosis not present

## 2016-10-05 DIAGNOSIS — Z79899 Other long term (current) drug therapy: Secondary | ICD-10-CM | POA: Diagnosis not present

## 2016-10-05 DIAGNOSIS — Z85038 Personal history of other malignant neoplasm of large intestine: Secondary | ICD-10-CM | POA: Diagnosis not present

## 2016-10-05 DIAGNOSIS — Z9842 Cataract extraction status, left eye: Secondary | ICD-10-CM | POA: Diagnosis not present

## 2016-10-05 DIAGNOSIS — Z85028 Personal history of other malignant neoplasm of stomach: Secondary | ICD-10-CM | POA: Insufficient documentation

## 2016-10-05 DIAGNOSIS — K633 Ulcer of intestine: Secondary | ICD-10-CM | POA: Diagnosis not present

## 2016-10-05 DIAGNOSIS — E785 Hyperlipidemia, unspecified: Secondary | ICD-10-CM | POA: Insufficient documentation

## 2016-10-05 DIAGNOSIS — K5190 Ulcerative colitis, unspecified, without complications: Secondary | ICD-10-CM | POA: Diagnosis not present

## 2016-10-05 DIAGNOSIS — Z8719 Personal history of other diseases of the digestive system: Secondary | ICD-10-CM | POA: Diagnosis not present

## 2016-10-05 DIAGNOSIS — Z1211 Encounter for screening for malignant neoplasm of colon: Secondary | ICD-10-CM | POA: Insufficient documentation

## 2016-10-05 DIAGNOSIS — Z9841 Cataract extraction status, right eye: Secondary | ICD-10-CM | POA: Diagnosis not present

## 2016-10-05 DIAGNOSIS — C189 Malignant neoplasm of colon, unspecified: Secondary | ICD-10-CM

## 2016-10-05 DIAGNOSIS — Z9049 Acquired absence of other specified parts of digestive tract: Secondary | ICD-10-CM | POA: Insufficient documentation

## 2016-10-05 DIAGNOSIS — Z800 Family history of malignant neoplasm of digestive organs: Secondary | ICD-10-CM | POA: Insufficient documentation

## 2016-10-05 DIAGNOSIS — I2510 Atherosclerotic heart disease of native coronary artery without angina pectoris: Secondary | ICD-10-CM | POA: Diagnosis not present

## 2016-10-05 DIAGNOSIS — K449 Diaphragmatic hernia without obstruction or gangrene: Secondary | ICD-10-CM | POA: Insufficient documentation

## 2016-10-05 DIAGNOSIS — F17210 Nicotine dependence, cigarettes, uncomplicated: Secondary | ICD-10-CM | POA: Diagnosis not present

## 2016-10-05 DIAGNOSIS — Z86010 Personal history of colonic polyps: Secondary | ICD-10-CM

## 2016-10-05 DIAGNOSIS — Z91041 Radiographic dye allergy status: Secondary | ICD-10-CM | POA: Diagnosis not present

## 2016-10-05 DIAGNOSIS — I10 Essential (primary) hypertension: Secondary | ICD-10-CM | POA: Diagnosis not present

## 2016-10-05 DIAGNOSIS — Z903 Acquired absence of stomach [part of]: Secondary | ICD-10-CM | POA: Insufficient documentation

## 2016-10-05 DIAGNOSIS — K219 Gastro-esophageal reflux disease without esophagitis: Secondary | ICD-10-CM | POA: Insufficient documentation

## 2016-10-05 DIAGNOSIS — Z7982 Long term (current) use of aspirin: Secondary | ICD-10-CM | POA: Diagnosis not present

## 2016-10-05 DIAGNOSIS — K6289 Other specified diseases of anus and rectum: Secondary | ICD-10-CM | POA: Diagnosis not present

## 2016-10-05 HISTORY — PX: BIOPSY: SHX5522

## 2016-10-05 HISTORY — PX: FLEXIBLE SIGMOIDOSCOPY: SHX5431

## 2016-10-05 SURGERY — SIGMOIDOSCOPY, FLEXIBLE
Anesthesia: Moderate Sedation

## 2016-10-05 MED ORDER — SODIUM CHLORIDE 0.9 % IV SOLN
INTRAVENOUS | Status: DC
Start: 2016-10-05 — End: 2016-10-05
  Administered 2016-10-05 (×2): via INTRAVENOUS

## 2016-10-05 MED ORDER — ONDANSETRON HCL 4 MG/2ML IJ SOLN
INTRAMUSCULAR | Status: DC | PRN
  Administered 2016-10-05: 4 mg via INTRAVENOUS

## 2016-10-05 MED ORDER — MEPERIDINE HCL 100 MG/ML IJ SOLN
INTRAMUSCULAR | Status: DC | PRN
  Administered 2016-10-05: 50 mg via INTRAVENOUS
  Administered 2016-10-05: 25 mg via INTRAVENOUS

## 2016-10-05 MED ORDER — ONDANSETRON HCL 4 MG/2ML IJ SOLN
INTRAMUSCULAR | Status: AC
  Filled 2016-10-05: qty 2

## 2016-10-05 MED ORDER — MIDAZOLAM HCL 5 MG/5ML IJ SOLN
INTRAMUSCULAR | Status: DC | PRN
  Administered 2016-10-05: 2 mg via INTRAVENOUS
  Administered 2016-10-05: 1 mg via INTRAVENOUS

## 2016-10-05 MED ORDER — MEPERIDINE HCL 100 MG/ML IJ SOLN
INTRAMUSCULAR | Status: AC
  Filled 2016-10-05: qty 2

## 2016-10-05 MED ORDER — MIDAZOLAM HCL 5 MG/5ML IJ SOLN
INTRAMUSCULAR | Status: AC
  Filled 2016-10-05: qty 10

## 2016-10-05 NOTE — Discharge Instructions (Signed)
°  Sigmoidoscopy Discharge Instructions  Read the instructions outlined below and refer to this sheet in the next few weeks. These discharge instructions provide you with general information on caring for yourself after you leave the hospital. Your doctor may also give you specific instructions. While your treatment has been planned according to the most current medical practices available, unavoidable complications occasionally occur. If you have any problems or questions after discharge, call Dr. Gala Romney at [phone]. ACTIVITY  You may resume your regular activity, but move at a slower pace for the next 24 hours.   Take frequent rest periods for the next 24 hours.   Walking will help get rid of the air and reduce the bloated feeling in your belly (abdomen).   No driving for 24 hours (because of the medicine (anesthesia) used during the test).    Do not sign any important legal documents or operate any machinery for 24 hours (because of the anesthesia used during the test).  NUTRITION  Drink plenty of fluids.   You may resume your normal diet as instructed by your doctor.   Begin with a light meal and progress to your normal diet. Heavy or fried foods are harder to digest and may make you feel sick to your stomach (nauseated).   Avoid alcoholic beverages for 24 hours or as instructed.  MEDICATIONS  You may resume your normal medications unless your doctor tells you otherwise.  WHAT YOU CAN EXPECT TODAY  Some feelings of bloating in the abdomen.   Passage of more gas than usual.   Spotting of blood in your stool or on the toilet paper.  IF YOU HAD POLYPS REMOVED DURING THE COLONOSCOPY:  No aspirin products for 7 days or as instructed.   No alcohol for 7 days or as instructed.   Eat a soft diet for the next 24 hours.  FINDING OUT THE RESULTS OF YOUR TEST Not all test results are available during your visit. If your test results are not back during the visit, make an appointment  with your caregiver to find out the results. Do not assume everything is normal if you have not heard from your caregiver or the medical facility. It is important for you to follow up on all of your test results.  SEEK IMMEDIATE MEDICAL ATTENTION IF:  You have more than a spotting of blood in your stool.   Your belly is swollen (abdominal distention).   You are nauseated or vomiting.   You have a temperature over 101.   You have abdominal pain or discomfort that is severe or gets worse throughout the day.    Further recommendations to follow pending review the pathology report

## 2016-10-05 NOTE — H&P (Signed)
@LOGO @   Primary Care Physician:  Dettinger, Fransisca Kaufmann, MD Primary Gastroenterologist:  Dr.  Gala Romney  Pre-Procedure History & Physical: HPI:  Steven Drake is a 71 y.o. male here for here for one-year surveillance examination of his residual colon status post subtotal colectomy and gastrectomy for metastatic gastric and colon cancer. He remains in remarkably good shape. 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  Past Medical History:  Diagnosis Date  . Adenocarcinoma of cecum ([place]) 07/01/2015  . 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 CAD (coronary artery disease) RCA non dominant vessel, LAD 40%, 80-90% 2nd diag EF 55%  01/19/2013   Dr. Claiborne Billings [place] 03-05-15 Epic.  . Family history of colon cancer   . Family history of stomach cancer   . Gastric adenocarcinoma ([place]) 06/29/2015  . Gastric ulcer    many yrs ago  . GERD (gastroesophageal reflux disease)   . Hyperlipidemia LDL goal < 70 01/19/2013  . Hypertension   . Hypokalemia 01/20/2013  . Metabolic syndrome, with mildly elevated HgBA1C 01/20/2013  . Pulmonary nodules 01/19/2013  . STEMI (ST elevation myocardial infarction), 01/19/13 01/19/2013  . Tobacco abuse 01/20/2013  . Transfusion history    with gastric ulcer- many yrs ago    Past Surgical History:  Procedure Laterality Date  . APPENDECTOMY    . CATARACT EXTRACTION, BILATERAL    . COLONOSCOPY N/A 06/10/2015   RMR: cecal neoplasm  ? biopsied. large polyp at the hepatic flexure removed with piecmeal polypectomy and APC ablation. lesion tattooed. Large polyp in the mid sigmoid status post piecemeal hot snare debulking and tattooing. this lesion not completely removed. scatterd pancolonic diverticulsosis. no speciments collected.   . ESOPHAGOGASTRODUODENOSCOPY N/A 06/10/2015   Procedure: ESOPHAGOGASTRODUODENOSCOPY (EGD);  Surgeon: Daneil Dolin, MD;  Location: [facility];  Service: Endoscopy;  Laterality: N/A;  . ESOPHAGOGASTRODUODENOSCOPY N/A 06/17/2015    RMR: normal esophagus small hiatal hernia. Abnormal nodular antrum .pyloric channel status post biopsy.   Marland Kitchen HERNIA REPAIR Right   . LAPAROSCOPIC SUBTOTAL COLECTOMY N/A 08/02/2015   Procedure: LAPAROSCOPIC SUBTOTAL COLECTOMY  AND DISTAL GASTRECTOMY ;  Surgeon: Stark Klein, MD;  Location: [facility] ORS;  Service: General;  Laterality: N/A;  . LEFT HEART CATH N/A 01/19/2013   Procedure: LEFT HEART CATH;  Surgeon: Troy Sine, MD;  Location: [facility] CATH LAB;  Service: Cardiovascular;  Laterality: N/A;    Prior to Admission medications   Medication Sig Start Date End Date Taking? Authorizing Provider  acetaminophen (TYLENOL) 325 MG tablet Take 650 mg by mouth daily as needed for moderate pain or headache.   Yes [provider]  aspirin 81 MG EC tablet Take 81 mg by mouth daily. 11/24/15  Yes [provider]  atorvastatin (LIPITOR) 40 MG tablet TAKE 1 TABLET BY MOUTH EVERY DAY 06/13/16  Yes Troy Sine, MD  benzonatate (TESSALON) 100 MG capsule Take 1 capsule (100 mg total) by mouth 2 (two) times daily as needed for cough. 06/19/16  Yes Dettinger, Fransisca Kaufmann, MD  clopidogrel (PLAVIX) 75 MG tablet TAKE 1 TABLET BY MOUTH DAILY WITH BREAKFAST 04/12/16  Yes Troy Sine, MD  lisinopril (PRINIVIL,ZESTRIL) 5 MG tablet TAKE 1 TABLET BY MOUTH EVERY DAY 09/13/16  Yes Stacks, Cletus Gash, MD  pantoprazole (PROTONIX) 40 MG tablet TAKE 1 TABLET BY MOUTH EVERY DAY AT 6:00 AM 04/12/16  Yes Troy Sine, MD  polyethylene glycol-electrolytes (TRILYTE) 420 g solution Take 4,000 mLs by mouth as directed. 08/14/16  Yes Donyetta Ogletree,  Cristopher Estimable, MD  diphenoxylate-atropine (LOMOTIL) 2.5-0.025 MG tablet TAKE 1 TO 2 TABLETS 4 TIMES DAILY AS NEEDED FOR DIARRHEA 06/12/16   Baird Cancer, PA-C  lidocaine-prilocaine (EMLA) cream APPLY A QUARTER SIZE AMOUNT TO PORT SITE 1 HOUR PRIOR TO CHEMO. DO NOT RUB IN. COVER WITH PLASTIC WRAP. 09/04/15   [provider]  ondansetron (ZOFRAN) 8 MG tablet Take 1 tablet (8 mg total) by  mouth every 8 (eight) hours as needed for nausea or vomiting. 09/04/15   Penland, Kelby Fam, MD  prochlorperazine (COMPAZINE) 10 MG tablet Take 1 tablet (10 mg total) by mouth every 6 (six) hours as needed for nausea or vomiting. 09/04/15   Penland, Kelby Fam, MD    Allergies as of 08/14/2016 - Review Complete 08/14/2016  Allergen Reaction Noted  . Contrast media [iodinated diagnostic agents] Itching and Rash 05/20/2015    Family History  Problem Relation Age of Onset  . Heart attack Mother   . Colon cancer Father        dx in his 83s  . Colon cancer Sister        dx possibly in her early 20s  . Stomach cancer Sister        dx in her 46s  . Colon cancer Paternal Uncle     Social History   Social History  . Marital status: Widowed    Spouse name: N/A  . Number of children: 0  . Years of education: N/A   Occupational History  . farmer    Social History Main Topics  . Smoking status: Current Every Day Smoker    Packs/day: 1.00    Years: 58.00    Types: Cigarettes    Start date: 01/21/1963  . Smokeless tobacco: Never Used     Comment: one pack daily  . Alcohol use No     Comment: none in 15 yrs -heavy user-none now.  . Drug use: No  . Sexual activity: Not on file   Other Topics Concern  . Not on file   Social History Narrative  . No narrative on file    Review of Systems: See HPI, otherwise negative ROS  Physical Exam: BP (!) 142/59   Pulse 71   Temp 97.9 F (36.6 C) (Oral)   Resp 18   Ht 5\' 11"  (1.803 m)   SpO2 100%  General:   Alert,  Well-developed, well-nourished, pleasant and cooperative in NAD  Neck:  Supple; no masses or thyromegaly. No significant cervical adenopathy. Lungs:  Clear throughout to auscultation.   No wheezes, crackles, or rhonchi. No acute distress. Heart:  Regular rate and rhythm; no murmurs, clicks, rubs,  or gallops. Abdomen: Non-distended, normal bowel sounds.  Soft and nontender without appreciable mass or hepatosplenomegaly.   Pulses:  Normal pulses noted. Extremities:  Without clubbing or edema.  Impression/Recommendations:  Pleasant 71 year old gentleman with metastatic colon cancer status post extended right hemicolectomy/subtotal colectomy here for a one-year surveillance examination.  The risks, benefits, limitations, alternatives and imponderables have been reviewed with the patient. Questions have been answered. All parties are agreeable.      Notice: This dictation was prepared with Dragon dictation along with smaller phrase technology. Any transcriptional errors that result from this process are unintentional and may not be corrected upon review.

## 2016-10-05 NOTE — Op Note (Signed)
[facility] Patient Name: Steven Drake Procedure Date: 10/05/2016 11:23 AM MRN: [id number] Date of Birth: [birth year] Attending MD: Norvel Richards , MD CSN: [id number] Age: 71 Admit Type: Outpatient Procedure:                Flexible Sigmoidoscopy Indications:              High risk colon cancer surveillance: Personal                            history of colon cancer Providers:                Norvel Richards, MD, Lurline Del, RN, Randa Spike, Technician Referring MD:              Medicines:                Midazolam 3 mg IV, Meperidine 50 mg IV, Ondansetron                            4 mg IV Complications:            No immediate complications. Estimated Blood Loss:     Estimated blood loss was minimal. Procedure:                Pre-Anesthesia Assessment:                           - Prior to the procedure, a History and Physical                            was performed, and patient medications and                            allergies were reviewed. The patient's tolerance of                            previous anesthesia was also reviewed. The risks                            and benefits of the procedure and the sedation                            options and risks were discussed with the patient.                            All questions were answered, and informed consent                            was obtained. Prior Anticoagulants: The patient has                            taken no previous anticoagulant or antiplatelet  agents. ASA Grade Assessment: III - A patient with                            severe systemic disease. After reviewing the risks                            and benefits, the patient was deemed in                            satisfactory condition to undergo the procedure.                           After obtaining informed consent, the scope was                            passed under direct  vision. The [id number])                            was introduced through the anus and advanced to the                            the ileo-sigmoid anastomosis. The flexible                            sigmoidoscopy was accomplished without difficulty.                            The patient tolerated the procedure well. The                            quality of the bowel preparation was adequate. Scope In: 11:53:25 AM Scope Out: 11:59:29 AM Total Procedure Duration: 0 hours 6 minutes 4 seconds  Findings:      The perianal and digital rectal examinations were normal. surgical       anastomosis at 15 cm from anal verge. Stenotic lumen at anastomosis.       Would not colonoscope. Upstream ileal mucosa seen in the distance.       Circumferential granulomatous appearing tissue with slight adenomatous       appearance at anastomosis. Biopsied Impression:               - Stenotic sigmoid ileal anastomosis - some                            abnormal appearing mucosa this level like scar                            issue?"biopsied Moderate Sedation:      Moderate (conscious) sedation was administered by the endoscopy nurse       and supervised by the endoscopist. The following parameters were       monitored: oxygen saturation, heart rate, blood pressure, respiratory       rate, EKG, adequacy of pulmonary ventilation, and response to care.       Total physician intraservice time was 15 minutes. Recommendation:           -  Continue present medications. follow-up on                            pathology. Procedure Code(s):        --- Professional ---                           [id number], Sigmoidoscopy, flexible; diagnostic,                            including collection of specimen(s) by brushing or                            washing, when performed (separate procedure)                           99152, Moderate sedation services provided by the                            same physician or other  qualified health care                            professional performing the diagnostic or                            therapeutic service that the sedation supports,                            requiring the presence of an independent trained                            observer to assist in the monitoring of the                            patient's level of consciousness and physiological                            status; initial 15 minutes of intraservice time,                            patient age 42 years or older Diagnosis Code(s):        --- Professional ---                           [id number], Personal history of other malignant                            neoplasm of large intestine CPT copyright 2016 American Medical Association. All rights reserved. The codes documented in this report are preliminary and upon coder review may  be revised to meet current compliance requirements. Cristopher Estimable. Nillie Bartolotta, MD Norvel Richards, MD 10/05/2016 12:13:35 PM This report has been signed electronically. Number of Addenda: 0

## 2016-10-06 ENCOUNTER — Telehealth (HOSPITAL_COMMUNITY): Payer: Self-pay

## 2016-10-06 NOTE — Telephone Encounter (Signed)
Notified patients wife of results and to increase calorie intake. She verbalized understanding.

## 2016-10-06 NOTE — Telephone Encounter (Signed)
-----   Message from Holley Bouche, NP sent at 10/06/2016 12:50 PM EDT ----- They went to [facility] inb[facility].   Labs look stable overall. Encourage him to eat more protein/calories as tolerated.  CEA is mildly elevated; will keep monitoring. He has PET scan coming up at the end of October which will give [name] more info than the CEA alone.   gwd ----- Message ----- From: Darlina Guys, LPN Sent: 4/45/8483  11:50 AM To: Holley Bouche, NP  Steven Drake,  Patient had labs on 09/21/16 and was supposed to be called with results. Nothing was sent via in basket. Will you tell me what to tell patient.  Thanks, MB

## 2016-10-09 ENCOUNTER — Encounter: Payer: Self-pay | Admitting: Internal Medicine

## 2016-10-20 ENCOUNTER — Encounter (HOSPITAL_COMMUNITY): Payer: Self-pay | Admitting: Internal Medicine

## 2016-11-16 ENCOUNTER — Encounter (HOSPITAL_COMMUNITY): Payer: Medicare HMO | Attending: Oncology

## 2016-11-16 ENCOUNTER — Encounter (HOSPITAL_COMMUNITY): Payer: Self-pay

## 2016-11-16 DIAGNOSIS — I252 Old myocardial infarction: Secondary | ICD-10-CM | POA: Insufficient documentation

## 2016-11-16 DIAGNOSIS — Z8711 Personal history of peptic ulcer disease: Secondary | ICD-10-CM | POA: Insufficient documentation

## 2016-11-16 DIAGNOSIS — M7989 Other specified soft tissue disorders: Secondary | ICD-10-CM | POA: Insufficient documentation

## 2016-11-16 DIAGNOSIS — I2510 Atherosclerotic heart disease of native coronary artery without angina pectoris: Secondary | ICD-10-CM | POA: Insufficient documentation

## 2016-11-16 DIAGNOSIS — C180 Malignant neoplasm of cecum: Secondary | ICD-10-CM | POA: Insufficient documentation

## 2016-11-16 DIAGNOSIS — Z9049 Acquired absence of other specified parts of digestive tract: Secondary | ICD-10-CM | POA: Insufficient documentation

## 2016-11-16 DIAGNOSIS — C19 Malignant neoplasm of rectosigmoid junction: Secondary | ICD-10-CM | POA: Insufficient documentation

## 2016-11-16 DIAGNOSIS — E876 Hypokalemia: Secondary | ICD-10-CM | POA: Insufficient documentation

## 2016-11-16 DIAGNOSIS — R918 Other nonspecific abnormal finding of lung field: Secondary | ICD-10-CM | POA: Insufficient documentation

## 2016-11-16 DIAGNOSIS — Z903 Acquired absence of stomach [part of]: Secondary | ICD-10-CM | POA: Insufficient documentation

## 2016-11-16 DIAGNOSIS — J439 Emphysema, unspecified: Secondary | ICD-10-CM | POA: Insufficient documentation

## 2016-11-16 DIAGNOSIS — Z452 Encounter for adjustment and management of vascular access device: Secondary | ICD-10-CM | POA: Diagnosis not present

## 2016-11-16 DIAGNOSIS — C169 Malignant neoplasm of stomach, unspecified: Secondary | ICD-10-CM | POA: Insufficient documentation

## 2016-11-16 DIAGNOSIS — Z7982 Long term (current) use of aspirin: Secondary | ICD-10-CM | POA: Insufficient documentation

## 2016-11-16 DIAGNOSIS — Z800 Family history of malignant neoplasm of digestive organs: Secondary | ICD-10-CM | POA: Insufficient documentation

## 2016-11-16 DIAGNOSIS — E785 Hyperlipidemia, unspecified: Secondary | ICD-10-CM | POA: Insufficient documentation

## 2016-11-16 DIAGNOSIS — I700 Atherosclerosis of aorta: Secondary | ICD-10-CM | POA: Insufficient documentation

## 2016-11-16 DIAGNOSIS — Z9889 Other specified postprocedural states: Secondary | ICD-10-CM | POA: Insufficient documentation

## 2016-11-16 DIAGNOSIS — K219 Gastro-esophageal reflux disease without esophagitis: Secondary | ICD-10-CM | POA: Insufficient documentation

## 2016-11-16 DIAGNOSIS — F17210 Nicotine dependence, cigarettes, uncomplicated: Secondary | ICD-10-CM | POA: Insufficient documentation

## 2016-11-16 DIAGNOSIS — D509 Iron deficiency anemia, unspecified: Secondary | ICD-10-CM | POA: Insufficient documentation

## 2016-11-16 DIAGNOSIS — I10 Essential (primary) hypertension: Secondary | ICD-10-CM | POA: Insufficient documentation

## 2016-11-16 MED ORDER — SODIUM CHLORIDE 0.9% FLUSH
10.0000 mL | INTRAVENOUS | Status: DC | PRN
  Administered 2016-11-16: 10 mL via INTRAVENOUS
  Filled 2016-11-16: qty 10

## 2016-11-16 MED ORDER — HEPARIN SOD (PORK) LOCK FLUSH 100 UNIT/ML IV SOLN
500.0000 [IU] | Freq: Once | INTRAVENOUS | Status: AC
  Administered 2016-11-16: 500 [IU] via INTRAVENOUS
  Filled 2016-11-16: qty 5

## 2016-11-16 NOTE — Progress Notes (Signed)
Steven Drake presented for Portacath access and flush.  Portacath located right chest wall accessed with  H 20 needle.  Good blood return present. Portacath flushed with 42ml NS and 500U/62ml Heparin and needle removed intact.  Procedure tolerated well and without incident.  Discharged ambulatory.

## 2016-11-29 ENCOUNTER — Other Ambulatory Visit: Payer: Self-pay | Admitting: Cardiovascular Disease

## 2016-12-11 ENCOUNTER — Other Ambulatory Visit: Payer: Self-pay | Admitting: Family Medicine

## 2016-12-25 ENCOUNTER — Ambulatory Visit (HOSPITAL_COMMUNITY)
Admission: RE | Admit: 2016-12-25 | Discharge: 2016-12-25 | Disposition: A | Discharge status: Home / Self Care | Payer: Medicare HMO | Source: Ambulatory Visit | Attending: Oncology | Admitting: Oncology

## 2016-12-25 DIAGNOSIS — R590 Localized enlarged lymph nodes: Secondary | ICD-10-CM | POA: Diagnosis not present

## 2016-12-25 DIAGNOSIS — C169 Malignant neoplasm of stomach, unspecified: Secondary | ICD-10-CM

## 2016-12-25 DIAGNOSIS — J439 Emphysema, unspecified: Secondary | ICD-10-CM | POA: Diagnosis not present

## 2016-12-25 DIAGNOSIS — R918 Other nonspecific abnormal finding of lung field: Secondary | ICD-10-CM | POA: Diagnosis not present

## 2016-12-25 DIAGNOSIS — C19 Malignant neoplasm of rectosigmoid junction: Secondary | ICD-10-CM | POA: Insufficient documentation

## 2016-12-25 DIAGNOSIS — Z9889 Other specified postprocedural states: Secondary | ICD-10-CM | POA: Diagnosis not present

## 2016-12-25 DIAGNOSIS — R937 Abnormal findings on diagnostic imaging of other parts of musculoskeletal system: Secondary | ICD-10-CM | POA: Insufficient documentation

## 2016-12-25 DIAGNOSIS — J320 Chronic maxillary sinusitis: Secondary | ICD-10-CM | POA: Diagnosis not present

## 2016-12-25 DIAGNOSIS — C180 Malignant neoplasm of cecum: Secondary | ICD-10-CM | POA: Insufficient documentation

## 2016-12-25 DIAGNOSIS — I700 Atherosclerosis of aorta: Secondary | ICD-10-CM | POA: Diagnosis not present

## 2016-12-25 LAB — GLUCOSE, CAPILLARY: Glucose-Capillary: 97 mg/dL (ref 65–99)

## 2016-12-25 MED ORDER — FLUDEOXYGLUCOSE F - 18 (FDG) INJECTION
8.2000 | Freq: Once | INTRAVENOUS | Status: AC | PRN
  Administered 2016-12-25: 8.2 via INTRAVENOUS

## 2016-12-27 ENCOUNTER — Encounter (HOSPITAL_COMMUNITY): Payer: Self-pay | Admitting: Oncology

## 2016-12-27 ENCOUNTER — Encounter (HOSPITAL_BASED_OUTPATIENT_CLINIC_OR_DEPARTMENT_OTHER): Payer: Medicare HMO

## 2016-12-27 ENCOUNTER — Encounter (HOSPITAL_COMMUNITY): Payer: Medicare HMO | Attending: Oncology | Admitting: Oncology

## 2016-12-27 VITALS — BP 148/64 | HR 63 | Temp 97.8°F | Resp 18 | Ht 71.0 in | Wt 164.5 lb

## 2016-12-27 DIAGNOSIS — J439 Emphysema, unspecified: Secondary | ICD-10-CM | POA: Insufficient documentation

## 2016-12-27 DIAGNOSIS — M7989 Other specified soft tissue disorders: Secondary | ICD-10-CM | POA: Insufficient documentation

## 2016-12-27 DIAGNOSIS — C180 Malignant neoplasm of cecum: Secondary | ICD-10-CM | POA: Insufficient documentation

## 2016-12-27 DIAGNOSIS — E785 Hyperlipidemia, unspecified: Secondary | ICD-10-CM | POA: Insufficient documentation

## 2016-12-27 DIAGNOSIS — K219 Gastro-esophageal reflux disease without esophagitis: Secondary | ICD-10-CM | POA: Insufficient documentation

## 2016-12-27 DIAGNOSIS — D509 Iron deficiency anemia, unspecified: Secondary | ICD-10-CM | POA: Insufficient documentation

## 2016-12-27 DIAGNOSIS — Z8711 Personal history of peptic ulcer disease: Secondary | ICD-10-CM | POA: Insufficient documentation

## 2016-12-27 DIAGNOSIS — C19 Malignant neoplasm of rectosigmoid junction: Secondary | ICD-10-CM | POA: Diagnosis not present

## 2016-12-27 DIAGNOSIS — Z9889 Other specified postprocedural states: Secondary | ICD-10-CM | POA: Insufficient documentation

## 2016-12-27 DIAGNOSIS — I2510 Atherosclerotic heart disease of native coronary artery without angina pectoris: Secondary | ICD-10-CM | POA: Insufficient documentation

## 2016-12-27 DIAGNOSIS — I10 Essential (primary) hypertension: Secondary | ICD-10-CM | POA: Insufficient documentation

## 2016-12-27 DIAGNOSIS — Z9049 Acquired absence of other specified parts of digestive tract: Secondary | ICD-10-CM | POA: Insufficient documentation

## 2016-12-27 DIAGNOSIS — I700 Atherosclerosis of aorta: Secondary | ICD-10-CM | POA: Insufficient documentation

## 2016-12-27 DIAGNOSIS — Z720 Tobacco use: Secondary | ICD-10-CM

## 2016-12-27 DIAGNOSIS — F17210 Nicotine dependence, cigarettes, uncomplicated: Secondary | ICD-10-CM | POA: Insufficient documentation

## 2016-12-27 DIAGNOSIS — C169 Malignant neoplasm of stomach, unspecified: Secondary | ICD-10-CM | POA: Diagnosis not present

## 2016-12-27 DIAGNOSIS — Z800 Family history of malignant neoplasm of digestive organs: Secondary | ICD-10-CM | POA: Insufficient documentation

## 2016-12-27 DIAGNOSIS — Z95828 Presence of other vascular implants and grafts: Secondary | ICD-10-CM

## 2016-12-27 DIAGNOSIS — E876 Hypokalemia: Secondary | ICD-10-CM | POA: Insufficient documentation

## 2016-12-27 DIAGNOSIS — R918 Other nonspecific abnormal finding of lung field: Secondary | ICD-10-CM | POA: Insufficient documentation

## 2016-12-27 DIAGNOSIS — I252 Old myocardial infarction: Secondary | ICD-10-CM | POA: Insufficient documentation

## 2016-12-27 DIAGNOSIS — Z7982 Long term (current) use of aspirin: Secondary | ICD-10-CM | POA: Insufficient documentation

## 2016-12-27 DIAGNOSIS — Z903 Acquired absence of stomach [part of]: Secondary | ICD-10-CM | POA: Insufficient documentation

## 2016-12-27 MED ORDER — SODIUM CHLORIDE 0.9% FLUSH
10.0000 mL | INTRAVENOUS | Status: DC | PRN
  Administered 2016-12-27: 10 mL via INTRAVENOUS
  Filled 2016-12-27: qty 10

## 2016-12-27 MED ORDER — HEPARIN SOD (PORK) LOCK FLUSH 100 UNIT/ML IV SOLN
500.0000 [IU] | Freq: Once | INTRAVENOUS | Status: AC
  Administered 2016-12-27: 500 [IU] via INTRAVENOUS

## 2016-12-27 NOTE — Patient Instructions (Signed)
[facility] at [facility] Discharge Instructions  RECOMMENDATIONS MADE BY THE CONSULTANT AND ANY TEST RESULTS WILL BE SENT TO YOUR REFERRING PHYSICIAN.  Portacath flushed per protocol today. Follow-up as scheduled. Call clinic for any questions or concerns  Thank you for choosing  Cancer Center at [facility] to provide your oncology and hematology care.  To afford each patient quality time with our provider, please arrive at least 15 minutes before your scheduled appointment time.    If you have a lab appointment with the Cancer Center please come in thru the  Main Entrance and check in at the main information desk  You need to re-schedule your appointment should you arrive 10 or more minutes late.  We strive to give you quality time with our providers, and arriving late affects you and other patients whose appointments are after yours.  Also, if you no show three or more times for appointments you may be dismissed from the clinic at the providers discretion.     Again, thank you for choosing [facility].  Our hope is that these requests will decrease the amount of time that you wait before being seen by our physicians.       _____________________________________________________________  Should you have questions after your visit to [facility], please contact our office at [phone] between the hours of 8:30 a.m. and 4:30 p.m.  Voicemails left after 4:30 p.m. will not be returned until the following business day.  For prescription refill requests, have your pharmacy contact our office.       Resources For Cancer Patients and their Caregivers ? American Cancer Society: Can assist with transportation, wigs, general needs, runs Look Good Feel Better.        1-888-227-6333 ? Cancer Care: Provides financial assistance, online support groups, medication/co-pay assistance.  1-800-813-HOPE (4673) ? [facility] Assists [place] cancer patients and their families through emotional , educational and financial support.  336-427-4357 ? [place] DSS Where to apply for food stamps, Medicaid and utility assistance. 336-342-1394 ? [facility]: Transportation to medical appointments. 336-347-2287 ? Social Security Administration: May apply for disability if have a Stage IV cancer. 336-342-7796 1-800-772-1213 ? [place] Aging, Disability and Transit Services: Assists with nutrition, care and transit needs. 336-349-2343  Cancer Center Support Programs: @10RELATIVEDAYS@ > Cancer Support Group  2nd Tuesday of the month 1pm-2pm, Journey Room  > Creative Journey  3rd Tuesday of the month 1130am-1pm, Journey Room  > Look Good Feel Better  1st Wednesday of the month 10am-12 noon, Journey Room (Call American Cancer Society to register 1-800-395-5775)   

## 2016-12-27 NOTE — Patient Instructions (Signed)
Steven Drake at [facility] Discharge Instructions  RECOMMENDATIONS MADE BY THE CONSULTANT AND ANY TEST RESULTS WILL BE SENT TO YOUR REFERRING PHYSICIAN.  You were seen today by Dr. Twana First Follow up every 2 months for port flush  Lab work and PET scan a few days prior to next appointment in 3 months   Thank you for choosing [place] at [facility] to provide your oncology and hematology care.  To afford each patient quality time with our provider, please arrive at least 15 minutes before your scheduled appointment time.    If you have a lab appointment with the [facility] please come in thru the  Main Entrance and check in at the main information desk  You need to re-schedule your appointment should you arrive 10 or more minutes late.  We strive to give you quality time with our providers, and arriving late affects you and other patients whose appointments are after yours.  Also, if you no show three or more times for appointments you may be dismissed from the clinic at the providers discretion.     Again, thank you for choosing [facility].  Our hope is that these requests will decrease the amount of time that you wait before being seen by our physicians.       _____________________________________________________________  Should you have questions after your visit to [facility], please contact our office at [phone] between the hours of 8:30 a.m. and 4:30 p.m.  Voicemails left after 4:30 p.m. will not be returned until the following business day.  For prescription refill requests, have your pharmacy contact our office.       Resources For Cancer Patients and their Caregivers ? American Cancer Society: Can assist with transportation, wigs, general needs, runs Look Good Feel Better.        934-085-7646 ? Cancer Care: Provides financial assistance, online support groups, medication/co-pay assistance.   1-800-813-HOPE 219-728-5318) ? [facility] Assists [place] cancer patients and their families through emotional , educational and financial support.  272 775 9940 ? [place] DSS Where to apply for food stamps, Medicaid and utility assistance. 813 393 4992 ? [facility]: Transportation to medical appointments. (310)296-4206 ? Social Security Administration: May apply for disability if have a Stage IV cancer. 586-402-6803 (231) 572-8736 ? [employer], Disability and Transit Services: Assists with nutrition, care and transit needs. [place] Support Programs: @10RELATIVEDAYS @ > Cancer Support Group  2nd Tuesday of the month 1pm-2pm, Journey Room  > Creative Journey  3rd Tuesday of the month 1130am-1pm, Journey Room  > Look Good Feel Better  1st Wednesday of the month 10am-12 noon, Journey Room (Call [employer] to register [phone])

## 2016-12-27 NOTE — Progress Notes (Signed)
[name] tolerated portacath flush well without complaints or incident. Port accessed with 20 gauge needle with blood return noted then flushed with 10 ml NS and 5 ml Heparin easily per protocol then de-accessed.No labs needed today per MD. Pt discharged self ambulatory in satisfactory condition accompanied by family member

## 2016-12-27 NOTE — Progress Notes (Signed)
[facility]  Progress Note  Patient Care Team: Dettinger, Fransisca Kaufmann, MD as PCP - General (Family Medicine) Gala Romney Cristopher Estimable, MD as Consulting Physician (Gastroenterology)  CHIEF COMPLAINTS/PURPOSE OF CONSULTATION:    Gastric adenocarcinoma [facility])   06/17/2015 Procedure    Stomach biopsy of pyloric lesion, Dr. 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      06/28/2015 PET scan    Intensely hypermetabolic cecal mass. No definitive evidence of metastatic disease. Scattered upper lobe predominant pulmonary nodules are too small for PET resolution.       08/02/2015 Procedure    Antral resection of tumor of stomach by Dr. Stark Klein      08/06/2015 Pathology Results    Invasive adenocarcinoma, poorly differentiated, 3.0 cm, extending into the muscularis propria, LVI +, 8/16 positive lymph nodes for metastatic disease, negative margins.      08/06/2015 Cancer Staging    pT2N3A      09/15/2015 Procedure    Port placed by IR      09/20/2015 - 10/18/2015 Chemotherapy    FOLFOX x 2 cycles      09/29/2015 Treatment Plan Change    5 FU bolus is discontinued      10/07/2015 Survivorship    Genetic Counseling consultation with [name]- Despite our recommendation, Steven Drake did not wish to pursue genetic testing at today's visit. We understand this decision, and remain available to coordinate genetic testing at any time in the future. We; therefore, recommend Steven Drake continue to follow the cancer screening guidelines given by his primary healthcare provider.      10/18/2015 Treatment Plan Change    Patient refuses future chemotherapy treatment      09/15/2016 Imaging    CT chest w/o contrast: IMPRESSION: 1. While several of the previously noted new pulmonary nodules have resolved compared to the prior study,  indicative of benign nodules, there are 2 enlarging nodules. One of these in the left lower lobe has increased from 5 mm to 8 mm in size. The largest nodule (visible in retrospect on the prior study) has also increased in size and currently measures 10 x 8 x 13 mm in the medial aspect of the right middle lobe (axial image 96 of series 4). These findings are suspicious for metastatic disease, although either of these lesions could alternatively represent a primary bronchogenic malignancy given the background of smoking related changes in the lungs. Further evaluation with PET-CT is recommended at this time. 2. Mild diffuse bronchial thickening with mild to moderate centrilobular and paraseptal emphysema; imaging findings suggestive of underlying COPD. 3. Aortic atherosclerosis, in addition to left main and 3 vessel coronary artery disease. Assessment for potential risk factor modification, dietary therapy or pharmacologic therapy may be warranted, if clinically indicated. 4. Additional incidental findings, as above.       Adenocarcinoma of cecum ([place])   06/10/2015 Procedure    Colonoscopy with Dr. Gala Romney, Cecal neoplasm. large polyp at hepatic flexure. lesion tattooed, large polyp in mid sigmoid (lesion not completely removed)      06/10/2015 Pathology Results    Cecal mass - adenocarcinoma, polyp at hepatic flexure foci of adenocarcinoma arising from tubulovillous adenoma      06/28/2015 PET scan    Intensely hypermetabolic cecal mass. No definitive evidence of metastatic disease. Scattered upper lobe predominant pulmonary nodules are  too small for PET resolution.       08/02/2015 Procedure    Segmental resection of tumor, right, transverse, descending colon by Dr. Stark Klein      08/06/2015 Pathology Results    Invasive adenocarcinoma, 4.3 cm, well-differentiated, extending through the muscularis propria into the pericolonic soft tissue. 2/14 lymph nodes positive for metastatic  disease. Surgical margins are negative.      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 Gala Romney, Cecal neoplasm. large polyp at hepatic flexure. lesion tattooed, large polyp in mid sigmoid (lesion not completely removed)      06/10/2015 Pathology Results    Polyp sigmoid - tubulovillous adenoma with at least high grade dysplasia cannot rule out focal adenocarcinoma      06/28/2015 PET scan    Intensely hypermetabolic cecal mass. No definitive evidence of metastatic disease. Scattered upper lobe predominant pulmonary nodules are too small for PET resolution.       08/02/2015 Procedure    Segmental resection for tumor of rectosigmoid colon by Dr. Stark Klein.      08/06/2015 Pathology Results    Invasive adenocarcinoma, 2.6 cm, well-differentiated, extending through the muscularis propria and into pericolonic soft tissue, 0/10 nodes involved, negative resection margins. No LVI.      08/06/2015 Cancer Staging    pT3N0     06/20/16 CT C/A/P: 1. Four new small solid scattered pulmonary nodules, largest 5 mm in the superior segment left lower lobe, suspicious for pulmonary metastases. Recommend attention on short-term follow-up chest CT in 3 months. 2. No evidence of metastatic disease in the abdomen or pelvis. 3. Status post distal gastrectomy and subtotal colectomy without complication. 4. Additional findings include aortic atherosclerosis, left main and 3 vessel coronary atherosclerosis, mild emphysema with diffuse bronchial wall thickening suggesting COPD and mildly enlarged prostate.  HISTORY OF PRESENTING ILLNESS:  Steven Drake 71 y.o. male is here for further follow-up of T2N3 Gastric Carcinoma, T3N1B adenocarcinoma of the cecum and T3N0 adenocarcinoma of the rectosigmoid junction.   He is doing well today. Patient states that he has been doing well. 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 He occasionally has swelling in his legs. He has neuropathy in his feet which is stable. He continues to smoke 1 pack per day. Denies chest pain, SOB, abdominal pain, diarrhea, constipation, weight loss, loss of appetite, or any other concerns.   MEDICAL HISTORY:  Past Medical History:  Diagnosis Date  . Adenocarcinoma of cecum ([place]) 07/01/2015  . 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 CAD (coronary artery disease) RCA non dominant vessel, LAD 40%, 80-90% 2nd diag EF 55%  01/19/2013   Dr. Claiborne Billings [place] 03-05-15 Epic.  . Family history of colon cancer   . Family history of stomach cancer   . Gastric adenocarcinoma ([place]) 06/29/2015  . Gastric ulcer    many yrs ago  . GERD (gastroesophageal reflux disease)   . Hyperlipidemia LDL goal < 70 01/19/2013  . Hypertension   . Hypokalemia 01/20/2013  . Metabolic syndrome, with mildly elevated HgBA1C 01/20/2013  . Pulmonary nodules 01/19/2013  . STEMI (ST elevation myocardial infarction), 01/19/13 01/19/2013  . Tobacco abuse 01/20/2013  . Transfusion history    with gastric ulcer- many yrs ago    SURGICAL HISTORY: Past Surgical History:  Procedure Laterality Date  . APPENDECTOMY    .  BIOPSY  10/05/2016   Procedure: BIOPSY;  Surgeon: Daneil Dolin, MD;  Location: [facility];  Service: Endoscopy;;  sigmoid anastomosis  . CATARACT EXTRACTION, BILATERAL    . COLONOSCOPY N/A 06/10/2015   RMR: cecal neoplasm  ? biopsied. large polyp at the hepatic flexure removed with piecmeal polypectomy and APC ablation. lesion tattooed. Large polyp in the mid sigmoid status post piecemeal hot snare debulking and tattooing. this lesion not completely removed. scatterd pancolonic diverticulsosis. no speciments collected.   . ESOPHAGOGASTRODUODENOSCOPY N/A 06/10/2015   Procedure: ESOPHAGOGASTRODUODENOSCOPY (EGD);  Surgeon: Daneil Dolin, MD;  Location: [facility];  Service: Endoscopy;  Laterality: N/A;  .  ESOPHAGOGASTRODUODENOSCOPY N/A 06/17/2015   RMR: normal esophagus small hiatal hernia. Abnormal nodular antrum .pyloric channel status post biopsy.   Marland Kitchen FLEXIBLE SIGMOIDOSCOPY N/A 10/05/2016   Procedure: FLEXIBLE SIGMOIDOSCOPY;  Surgeon: Daneil Dolin, MD;  Location: [facility];  Service: Endoscopy;  Laterality: N/A;  130   . HERNIA REPAIR Right   . LAPAROSCOPIC SUBTOTAL COLECTOMY N/A 08/02/2015   Procedure: LAPAROSCOPIC SUBTOTAL COLECTOMY  AND DISTAL GASTRECTOMY ;  Surgeon: Stark Klein, MD;  Location: [facility];  Service: General;  Laterality: N/A;  . LEFT HEART CATH N/A 01/19/2013   Procedure: LEFT HEART CATH;  Surgeon: Troy Sine, MD;  Location: [facility] CATH LAB;  Service: Cardiovascular;  Laterality: N/A;    SOCIAL HISTORY: Social History   Social History  . Marital status: Widowed    Spouse name: N/A  . Number of children: 0  . Years of education: N/A   Occupational History  . farmer    Social History Main Topics  . Smoking status: Current Every Day Smoker    Packs/day: 1.00    Years: 58.00    Types: Cigarettes    Start date: 01/21/1963  . Smokeless tobacco: Never Used     Comment: one pack daily  . Alcohol use No     Comment: none in 15 yrs -heavy user-none now.  . Drug use: No  . Sexual activity: Not on file   Other Topics Concern  . Not on file   Social History Narrative  . No narrative on file  Widowed since 2003 Has 2 step kids Still smokes No ETOH use Retired. Used to drive a truck cross country for 45 years.   FAMILY HISTORY: Family History  Problem Relation Age of Onset  . Heart attack Mother   . Colon cancer Father        dx in his 24s  . Colon cancer Sister        dx possibly in her early 10s  . Stomach cancer Sister        dx in her 66s  . Colon cancer Paternal Uncle   1 brother and 1 sister living Oldest brother died; sister died from stomach cancer; Other sister died of colon cancer Father died of colon cancer at 81 years old. Mother died of  a heart attack in her 77s.  ALLERGIES:  is allergic to contrast media [iodinated diagnostic agents].  MEDICATIONS:  Current Outpatient Prescriptions  Medication Sig Dispense Refill  . acetaminophen (TYLENOL) 325 MG tablet Take 650 mg by mouth daily as needed for moderate pain or headache.    Marland Kitchen aspirin 81 MG EC tablet Take 81 mg by mouth daily.  12  . atorvastatin (LIPITOR) 40 MG tablet TAKE ONE TABLET BY MOUTH EVERY DAY 90 tablet 0  . benzonatate (TESSALON) 100 MG capsule Take 1  capsule (100 mg total) by mouth 2 (two) times daily as needed for cough. 20 capsule 0  . clopidogrel (PLAVIX) 75 MG tablet TAKE 1 TABLET BY MOUTH DAILY WITH BREAKFAST 30 tablet 11  . diphenoxylate-atropine (LOMOTIL) 2.5-0.025 MG tablet TAKE 1 TO 2 TABLETS 4 TIMES DAILY AS NEEDED FOR DIARRHEA 30 tablet 1  . lidocaine-prilocaine (EMLA) cream APPLY A QUARTER SIZE AMOUNT TO PORT SITE 1 HOUR PRIOR TO CHEMO. DO NOT RUB IN. COVER WITH PLASTIC WRAP.  3  . lisinopril (PRINIVIL,ZESTRIL) 5 MG tablet TAKE ONE TABLET BY MOUTH EVERY DAY 90 tablet 0  . ondansetron (ZOFRAN) 8 MG tablet Take 1 tablet (8 mg total) by mouth every 8 (eight) hours as needed for nausea or vomiting. 30 tablet 2  . pantoprazole (PROTONIX) 40 MG tablet TAKE 1 TABLET BY MOUTH EVERY DAY AT 6:00 AM 30 tablet 11  . prochlorperazine (COMPAZINE) 10 MG tablet Take 1 tablet (10 mg total) by mouth every 6 (six) hours as needed for nausea or vomiting. 30 tablet 2  . polyethylene glycol-electrolytes (TRILYTE) 420 g solution Take 4,000 mLs by mouth as directed. (Patient not taking: Reported on 12/27/2016) 4000 mL 0   No current facility-administered medications for this visit.    Facility-Administered Medications Ordered in Other Visits  Medication Dose Route Frequency Provider Last Rate Last Dose  . sodium chloride flush (NS) 0.9 % injection 10 mL  10 mL Intravenous PRN Twana First, MD   10 mL at 12/27/16 1100   Review of Systems  Constitutional: Negative.  Negative  for weight loss.       No loss of appetite  HENT: Negative.   Eyes: Negative.   Respiratory: Negative.  Negative for shortness of breath.   Cardiovascular: Positive for leg swelling (occasional). Negative for chest pain.  Gastrointestinal: Negative.  Negative for abdominal pain, constipation and diarrhea.  Genitourinary: Negative.   Musculoskeletal: Negative.   Skin: Negative.   Neurological: Tingling: chronic neuropathy in feet.  Endo/Heme/Allergies: Negative.   Psychiatric/Behavioral: Negative.   All other systems reviewed and are negative. 14 point ROS was done and is otherwise as detailed above or in HPI  PHYSICAL EXAMINATION: ECOG PERFORMANCE STATUS: 0 - Asymptomatic  Vitals:   12/27/16 1048  BP: (!) 148/64  Pulse: 63  Resp: 18  Temp: 97.8 F (36.6 C)  SpO2: 99%   Filed Weights   12/27/16 1048  Weight: 164 lb 8 oz (74.6 kg)    Physical Exam  Constitutional: He is oriented to person, place, and time and well-developed, well-nourished, and in no distress.  HENT:  Head: Normocephalic and atraumatic.  Nose: Nose normal.  Mouth/Throat: Oropharynx is clear and moist. No oropharyngeal exudate.  Eyes: Pupils are equal, round, and reactive to light. Conjunctivae and EOM are normal. Right eye exhibits no discharge. Left eye exhibits no discharge. No scleral icterus.  Neck: Normal range of motion. Neck supple. No tracheal deviation present. No thyromegaly present.  Cardiovascular: Normal rate, regular rhythm and normal heart sounds.  Exam reveals no gallop and no friction rub.   No murmur heard. Pulmonary/Chest: Effort normal and breath sounds normal. He has no wheezes. He has no rales.  Abdominal: Soft. Bowel sounds are normal. He exhibits no distension and no mass. There is no tenderness. There is no rebound and no guarding.  Musculoskeletal: Normal range of motion. He exhibits no edema.  Lymphadenopathy:    He has no cervical adenopathy.  Neurological: He is alert and  oriented to person, place,  and time. He has normal reflexes. No cranial nerve deficit. Gait normal. Coordination normal.  Skin: Skin is warm and dry. No rash noted.  Psychiatric: Mood, memory, affect and judgment normal.  Nursing note and vitals reviewed.  LABORATORY DATA:  I have reviewed the data as listed Lab Results  Component Value Date   WBC 7.2 09/21/2016   HGB 12.2 (L) 09/21/2016   HCT 36.9 (L) 09/21/2016   MCV 92.7 09/21/2016   PLT 187 09/21/2016   CMP     Component Value Date/Time   NA 137 09/21/2016 1043   K 3.9 09/21/2016 1043   CL 106 09/21/2016 1043   CO2 25 09/21/2016 1043   GLUCOSE 122 (H) 09/21/2016 1043   BUN 11 09/21/2016 1043   CREATININE 1.08 09/21/2016 1043   CREATININE 1.32 (H) 04/13/2015 1252   CALCIUM 8.8 (L) 09/21/2016 1043   PROT 6.7 09/21/2016 1043   ALBUMIN 3.4 (L) 09/21/2016 1043   AST 14 (L) 09/21/2016 1043   ALT 10 (L) 09/21/2016 1043   ALKPHOS 110 09/21/2016 1043   BILITOT 0.6 09/21/2016 1043   GFRNONAA >60 09/21/2016 1043   GFRAA >60 09/21/2016 1043     RADIOGRAPHIC STUDIES: I have personally reviewed the radiological images as listed and agreed with the findings in the report.  CT CAP w Contrast 06/20/2016 IMPRESSION: 1. Four new small solid scattered pulmonary nodules, largest 5 mm in the superior segment left lower lobe, suspicious for pulmonary metastases. Recommend attention on short-term follow-up chest CT in 3 months. 2. No evidence of metastatic disease in the abdomen or pelvis. 3. Status post distal gastrectomy and subtotal colectomy without complication. 4. Additional findings include aortic atherosclerosis, left main and 3 vessel coronary atherosclerosis, mild emphysema with diffuse bronchial wall thickening suggesting COPD and mildly enlarged prostate.  PATHOLOGY:   ASSESSMENT & PLAN:  Cancer Staging Adenocarcinoma of cecum [facility]) Staging form: Colon and Rectum, AJCC 7th Edition - Pathologic stage from 08/06/2015:  Stage IIIB (T3, N1b, cM0) - Signed by Baird Cancer, PA-C on 08/20/2015  Adenocarcinoma of rectosigmoid junction [facility]) Staging form: Colon and Rectum, AJCC 7th Edition - Pathologic stage from 08/06/2015: Stage IIA (T3, N0, cM0) - Signed by Baird Cancer, PA-C on 08/20/2015  Gastric adenocarcinoma [facility]) Staging form: Stomach, AJCC 7th Edition - Pathologic stage from 08/06/2015: Stage IIIA (T2, N3a, cM0) - Signed by Baird Cancer, PA-C on 08/20/2015 Anemia Iron deficiency s/p one dose Injectafer Elevated CEA Bilateral Pulmonary nodules Tobacco Abuse  He had iron deficiency prior to surgery and was given one dose of injectafer.Iron levels are pending today. When available he will be notified and if additional iron is needed we will arrange for this.  Gastric adenocarcinoma ([place]) Stage IIIA (T2N3AM0) invasive adenocarcinoma of antrum of stomach with 8/16 lymph nodes involved with metastatic disease having undergone definitive resection by Dr. Barry Dienes on 08/02/2015.  He did not completed adjuvant FOLFOX.   Adenocarcinoma of cecum ([place]) Stage IIIB (T3N1BM0) invasive adenocarcinoma of cecum, S/P resection by Dr. Barry Dienes on 08/02/2015.  Again did not complete adjuvant FOLFOX, continues to follow with GI.   Adenocarcinoma of rectosigmoid junction ([facility]) Stage IIA (T3N0M0) invasive adenocarcinoma of rectosigmoid colon, S/P surgical resection by Dr. Barry Dienes on 08/02/2015.  PLAN: I have reviewed patient's restaging PET/CT in detail with him today. The 2 enlarging pulmonary nodules shown on recent chest CT in July are both mildly hypermetabolic and highly suspicious for metastatic disease. In addition there is a new peripheral left upper lobe  pulmonary nodule measuring 8 mm.  No new metastatic or recurrent disease otherwise.   I have discussed the pulmonary nodule findings in detail with the patient today and discuss potential biopsy of the right middle lobe nodule since it is greater than 1 cm versus  continued observation with repeat PET/CT in 3 months.  Patient has opted for continued observation.  Therefore we will see him back in 3 months for follow-up with repeat PET/CT with labs.     Orders Placed This Encounter  Procedures  . NM PET Image Restag (PS) Skull Base To Thigh    New pulmonary nodules on last PET, assess for growth and increased hypermetabolism    Standing Status:   Future    Standing Expiration Date:   12/27/2017    Order Specific Question:   If indicated for the ordered procedure, I authorize the administration of a radiopharmaceutical per Radiology protocol    Answer:   Yes    Order Specific Question:   Preferred imaging location?    Answer:   [facility]    Order Specific Question:   Radiology Contrast Protocol - do NOT remove file path    Answer:   [email or web address]  . CBC with Differential    Standing Status:   Future    Standing Expiration Date:   12/27/2017  . Comprehensive metabolic panel    Standing Status:   Future    Standing Expiration Date:   12/27/2017  . CEA    Standing Status:   Future    Standing Expiration Date:   12/27/2017   All questions were answered. The patient knows to call the clinic with any problems, questions or concerns.    This note was electronically signed.    Twana First, MD  12/27/2016 12:41 PM

## 2016-12-29 ENCOUNTER — Ambulatory Visit (INDEPENDENT_AMBULATORY_CARE_PROVIDER_SITE_OTHER): Payer: Medicare HMO | Admitting: Cardiovascular Disease

## 2016-12-29 ENCOUNTER — Encounter: Payer: Self-pay | Admitting: Cardiovascular Disease

## 2016-12-29 VITALS — BP 120/64 | HR 59 | Ht 71.0 in | Wt 166.0 lb

## 2016-12-29 DIAGNOSIS — I2510 Atherosclerotic heart disease of native coronary artery without angina pectoris: Secondary | ICD-10-CM

## 2016-12-29 DIAGNOSIS — Z716 Tobacco abuse counseling: Secondary | ICD-10-CM | POA: Diagnosis not present

## 2016-12-29 DIAGNOSIS — E785 Hyperlipidemia, unspecified: Secondary | ICD-10-CM

## 2016-12-29 DIAGNOSIS — Z85028 Personal history of other malignant neoplasm of stomach: Secondary | ICD-10-CM

## 2016-12-29 DIAGNOSIS — Z79899 Other long term (current) drug therapy: Secondary | ICD-10-CM

## 2016-12-29 DIAGNOSIS — Z85038 Personal history of other malignant neoplasm of large intestine: Secondary | ICD-10-CM | POA: Diagnosis not present

## 2016-12-29 DIAGNOSIS — D649 Anemia, unspecified: Secondary | ICD-10-CM | POA: Diagnosis not present

## 2016-12-29 NOTE — Progress Notes (Signed)
[name], male   DOB: 15-Oct-1945, 71 y.o.   MRN: [id number]     [name] is a 71 y.o. male who presents for a 12 month followup cardiology evaluation.  Steven Drake has established CAD and was transferred acutely from [facility] on 01/19/2013 with possible ST segment elevation myocardial infarction.  Initial troponin was negative but a subsequent troponin was positive and he was noted to have anterolateral ST changes. Upon arrival to [facility] he had T-wave inversion in 1 and aVL, V4 through V6. Cardiac catheterization done emergently by me showed low normal LV function with moderately severe focal hypocontractility of the mid anterolateral wall and minimal hypocontractility of the focal inferolateral wall. Ejection fraction was approximately 50-55%. He was found to have 40% smooth ostial narrowing of the LAD, a dominant left circumflex vessel with 70% ostial narrowing in the first marginal branch and diffuse 50% narrowing in the second marginal branch. His RCA was nondominant and had a 95% proximal stenosis and he had a subtotal/total mid occlusion with moderate right to right bridging collaterals and extensive left to right collaterals. Medical therapy was recommended and smoking cessation was felt to be imperative.   He has a long-standing history of tobacco and continues to smoke 1 ppd.  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  He continues to be on dual antiplatelet therapy and denies bleeding.  He also is on lisinopril 5 mg, amlodipine 2.5 mg and atorvastatin 40 mg. initially had been on Zetia but is no longer taking this.  He has history of GERD for which she is on Protonix.  He presents for evaluation.  I have reviewed recent laboratory from 08/24/2014.  He had been working outside in significant heat.  Hemoglobin was 13.2 medical 38.9.  BUN 15, creatinine 1.53, which had  increased from one year ago.  He is on a reduced dose of lisinopril at 5 mg.  Cholesterol was 140, triglycerides are elevated at 202, HDL was low at 25, and LDL 75.  TSH was normal.  Lower extremity Doppler evaluation in January 2017  revealed triphasic waveforms.  ABI was 1.1 on the right 1.0 on the left.  However, bilateral great toe PPG's were abnormal.  He underwent surgery, both for colon cancer and gastric adenocarcinoma with a laparoscopic subtotal colectomy with ileorectal anastomosis and open distal gastrectomy with Billroth II reconstruction on 08/02/2015.  He tolerated surgery without cardiovascular compromise.    Since I last saw him one year ago, he continues to smoke cigarettes.  He started smoking at age 55 and has been smoking for over 18 years.  He denies any chest tightness.  He is unaware of palpitations.  He continues to take lisinopril for hypertension.  He is on aspirin and Plavix without bleeding.  He continues to be on atorvastatin for hyperlipidemia.  GERD is controlled with pantoprazole.  He presents for evaluation.  Past Medical History  Diagnosis Date  . GERD (gastroesophageal reflux disease)   . STEMI (ST elevation myocardial infarction), 01/19/13 01/19/2013  . CAD (coronary artery disease) RCA non dominant vessel, LAD 40%, 80-90% 2nd diag EF 55%  01/19/2013  . Hyperlipidemia LDL goal < 70 01/19/2013  . Pulmonary nodules 01/19/2013  . Hypokalemia 01/20/2013  . Tobacco abuse 01/20/2013  . Metabolic syndrome, with mildly elevated HgBA1C 01/20/2013    Past Surgical History  Procedure Laterality Date  . Appendectomy  No Known Allergies  Current Outpatient Prescriptions  Medication Sig Dispense Refill  . acetaminophen (TYLENOL) 325 MG tablet Take 2 tablets (650 mg total) by mouth every 4 (four) hours as needed for headache or mild pain.      Marland Kitchen aspirin EC 81 MG EC tablet Take 1 tablet (81 mg total) by mouth daily.      . nitroGLYCERIN (NITROSTAT) 0.4 MG SL  tablet Place 1 tablet (0.4 mg total) under the tongue every 5 (five) minutes x 3 doses as needed for chest pain.  25 tablet  4  . amLODipine (NORVASC) 2.5 MG tablet Take 1 tablet (2.5 mg total) by mouth daily.  30 tablet  6  . atorvastatin (LIPITOR) 80 MG tablet Take 1 tablet (80 mg total) by mouth daily at 6 PM.  30 tablet  6  . clopidogrel (PLAVIX) 75 MG tablet Take 1 tablet (75 mg total) by mouth daily with breakfast.  30 tablet  6  . lisinopril (PRINIVIL,ZESTRIL) 5 MG tablet Take 1 tablet (5 mg total) by mouth daily.  30 tablet  6  . pantoprazole (PROTONIX) 40 MG tablet Take 1 tablet (40 mg total) by mouth daily at 6 (six) AM.  30 tablet  6   No current facility-administered medications for this visit.    History   Social History  . Marital Status: Widowed    Spouse Name: N/A    Number of Children: N/A  . Years of Education: N/A   Occupational History  . farmer    Social History Main Topics  . Smoking status: Current Every Day Smoker -- 1.00 packs/day for 75 years    Types: Cigarettes    Start date: 01/21/1963  . Smokeless tobacco: Never Used  . Alcohol Use: Not on file  . Drug Use: Not on file  . Sexual Activity: Not on file   Other Topics Concern  . Not on file   Social History Narrative  . No narrative on file   Social history is notable in that he is widowed. He does not have children. He is retired. He had smoked for 55 years but quit smoking 2 weeks ago at the time of his acute event. He does not drink alcohol.   Family history is notable for parents are deceased.  ROS General: Negative; No fevers, chills, or night sweats;  HEENT: Negative; No changes in vision or hearing, sinus congestion, difficulty swallowing Pulmonary: Negative; No cough, wheezing, shortness of breath, hemoptysis Cardiovascular: Negative; No chest pain, presyncope, syncope, palpatations GI: Negative; No nausea, vomiting, diarrhea, or abdominal pain GU: Negative; No dysuria, hematuria, or  difficulty voiding Musculoskeletal: Occasional shoulder discomfort; no myalgias,  or weakness Hematologic/Oncology: Negative; no easy bruising, bleeding Endocrine: Negative; no heat/cold intolerance; no diabetes Neuro: Negative; no changes in balance, headaches Skin: Negative; No rashes or skin lesions Psychiatric: Negative; No behavioral problems, depression Sleep: Negative; No snoring, daytime sleepiness, hypersomnolence, bruxism, restless legs, hypnogognic hallucinations, no cataplexy Other comprehensive 14 point system review is negative.   PE BP 120/64   Pulse (!) 59   Ht 5' 11"  (1.803 m)   Wt 166 lb (75.3 kg)   BMI 23.15 kg/m    Repeat blood pressure 124/70  Wt Readings from Last 3 Encounters:  12/29/16 166 lb (75.3 kg)  12/27/16 164 lb 8 oz (74.6 kg)  09/21/16 163 lb 3.2 oz (74 kg)   General: Alert, oriented, no distress.  Skin: normal turgor, no rashes, warm and dry HEENT: Normocephalic, atraumatic. Pupils  equal round and reactive to light; sclera anicteric; extraocular muscles intact; bearded with tobacco stains Nose without nasal septal hypertrophy Mouth/Parynx benign; Mallinpatti scale 3 Neck: No JVD, no carotid bruits; normal carotid upstroke Lungs: clear to ausculatation and percussion; no wheezing or rales Chest wall: without tenderness to palpitation Heart: PMI not displaced, RRR, s1 s2 normal, 1/6 systolic murmur, no diastolic murmur, no rubs, gallops, thrills, or heaves Abdomen: soft, nontender; no hepatosplenomehaly, BS+; abdominal aorta nontender and not dilated by palpation. Back: no CVA tenderness Pulses 2+ bilateral femoral bruits. Musculoskeletal: full range of motion, normal strength, no joint deformities Extremities: no clubbing cyanosis or edema, Homan's sign negative  Neurologic: grossly nonfocal; Cranial nerves grossly wnl Psychologic: Normal mood and affect   ECG (independently read by me): Sinus bradycardia 59 bpm.  No ectopy.  Normal  intervals.  No ST segment changes  October 2017 ECG (independently read by me): Sinus bradycardia at 59 bpm.  January 2017 ECG (independently read by me): Sinus bradycardia with 1 PAC.  Heart rate 54 bpm.  Normal intervals.  No significant ST-T changes.  June 2016 ECG (independently read by me): Sinus bradycardia 53 bpm.  January 2016 ECG (independently read by me): Sinus bradycardia 51 bpm.  July 2015 ECG (independently read by me): Sinus bradycardia 51 beats per minute.  No ectopy.  QTc interval 414 ms.  Prior 05/06/2013 ECG (independently read by me): Sinus bradycardia 51 beats per minute. No ectopy. No significant ST changes.  Prior ECG from 02/04/2013 ECG: Sinus rhythm with T-wave inversion in leads 1 and avL and V2  LABS: BMP Latest Ref Rng & Units 09/21/2016 06/02/2016 03/01/2016  Glucose 65 - 99 mg/dL 122(H) 106(H) 99  BUN 6 - 20 mg/dL 11 18 14   Creatinine 0.61 - 1.24 mg/dL 1.08 1.06 1.06  Sodium 135 - 145 mmol/L 137 138 134(L)  Potassium 3.5 - 5.1 mmol/L 3.9 4.0 3.5  Chloride 101 - 111 mmol/L 106 106 103  CO2 22 - 32 mmol/L 25 25 26   Calcium 8.9 - 10.3 mg/dL 8.8(L) 8.7(L) 8.7(L)   Hepatic Function Latest Ref Rng & Units 09/21/2016 06/02/2016 03/01/2016  Total Protein 6.5 - 8.1 g/dL 6.7 6.5 6.5  Albumin 3.5 - 5.0 g/dL 3.4(L) 3.2(L) 3.4(L)  AST 15 - 41 U/L 14(L) 15 15  ALT 17 - 63 U/L 10(L) 12(L) 9(L)  Alk Phosphatase 38 - 126 U/L 110 109 107  Total Bilirubin 0.3 - 1.2 mg/dL 0.6 0.4 0.3   CBC Latest Ref Rng & Units 09/21/2016 06/02/2016 03/01/2016  WBC 4.0 - 10.5 K/uL 7.2 9.7 7.8  Hemoglobin 13.0 - 17.0 g/dL 12.2(L) 11.8(L) 11.1(L)  Hematocrit 39.0 - 52.0 % 36.9(L) 35.6(L) 34.6(L)  Platelets 150 - 400 K/uL 187 215 211   Lab Results  Component Value Date   MCV 92.7 09/21/2016   MCV 89.4 06/02/2016   MCV 89.2 03/01/2016   Lab Results  Component Value Date   TSH 0.97 04/13/2015   Lab Results  Component Value Date   HGBA1C 6.2 (H) 07/29/2015   Lipid Panel     Component  Value Date/Time   CHOL 116 (L) 04/13/2015 1252   TRIG 162 (H) 04/13/2015 1252   HDL 24 (L) 04/13/2015 1252   CHOLHDL 4.8 04/13/2015 1252   VLDL 32 (H) 04/13/2015 1252   LDLCALC 60 04/13/2015 1252     RADIOLOGY: No results found.  IMPRESSION:  1. CAD in native artery   2. Hyperlipidemia with target LDL less than 70  3. Anemia, unspecified type   4. Medication management   5. Tobacco abuse counseling   6. History of colon cancer   7. History of gastric cancer     ASSESSMENT AND PLAN: Steven Drake is a 71 year old gentleman who presented with an acute coronary syndrome and underwent emergent cardiac catheterization on11/23/2014. He was found to have subtotal occlusion of his RCA and had both right to right and extensive left-to-right collaterals. He also had concomitant CAD as noted above with 80-90% stenosis in a small second diagonal branch of his LAD as well as 70 and 50% stenoses in his marginal vessels. He had a mild wall motion abnormality consistent with his diagonal stenosis.  He has been on medical therapy.  Presently, he is without anginal symptomatology.  His blood pressure today is stable on lisinopril at 124/70 on repeat by me.  He continues to be on atorvastatin 40 mg with target LDL less than 70.  He is status post colectomy and also had gastric adenoma.  Unfortunately he continues to smoke.  Again discussed importance of smoking cessation.  I am not confident that he will quit.  We discussed the importance of exercise at least 5 days per week for 30 minutes.  At present, he is not having any edema.  GERD is controlled.  I will see him in 6 months for reevaluation.   Time spent: 25 minutes  Troy Sine, MD, [facility]  9:07 PM  12/31/2016

## 2016-12-29 NOTE — Patient Instructions (Signed)
Medication Instructions:  Your physician recommends that you continue on your current medications as directed. Please refer to the Current Medication list given to you today.  Labwork: Please return for FASTING labs (CMET, CBC, Lipid, TSH)  Our in office lab hours are Monday-Friday 8:00-4:30, closed for lunch 1-2 pm.  No appointment needed.  Follow-Up: Your physician wants you to follow-up in: 6 months with Dr. Claiborne Billings. You will receive a reminder letter in the mail two months in advance. If you don't receive a letter, please call our office to schedule the follow-up appointment.   Any Other Special Instructions Will Be Listed Below (If Applicable).     If you need a refill on your cardiac medications before your next appointment, please call your pharmacy.

## 2016-12-31 ENCOUNTER — Encounter: Payer: Self-pay | Admitting: Cardiovascular Disease

## 2017-01-05 DIAGNOSIS — D649 Anemia, unspecified: Secondary | ICD-10-CM | POA: Diagnosis not present

## 2017-01-05 DIAGNOSIS — Z79899 Other long term (current) drug therapy: Secondary | ICD-10-CM | POA: Diagnosis not present

## 2017-01-05 DIAGNOSIS — E785 Hyperlipidemia, unspecified: Secondary | ICD-10-CM | POA: Diagnosis not present

## 2017-01-05 DIAGNOSIS — I2510 Atherosclerotic heart disease of native coronary artery without angina pectoris: Secondary | ICD-10-CM | POA: Diagnosis not present

## 2017-01-05 LAB — LIPID PANEL
CHOLESTEROL TOTAL: 132 mg/dL (ref 100–199)
Chol/HDL Ratio: 4.4 ratio (ref 0.0–5.0)
HDL: 30 mg/dL — ABNORMAL LOW (ref >39)
LDL Calculated: 84 mg/dL (ref 0–99)
TRIGLYCERIDES: 89 mg/dL (ref 0–149)
VLDL CHOLESTEROL CAL: 18 mg/dL (ref 5–40)

## 2017-01-05 LAB — CBC
Hematocrit: 40.5 % (ref 37.5–51.0)
Hemoglobin: 13 g/dL (ref 13.0–17.7)
MCH: 29.1 pg (ref 26.6–33.0)
MCHC: 32.1 g/dL (ref 31.5–35.7)
MCV: 91 fL (ref 79–97)
Platelets: 233 10*3/uL (ref 150–379)
RBC: 4.47 x10E6/uL (ref 4.14–5.80)
RDW: 14.1 % (ref 12.3–15.4)
WBC: 8.8 10*3/uL (ref 3.4–10.8)

## 2017-01-05 LAB — COMPREHENSIVE METABOLIC PANEL
A/G RATIO: 1.6 (ref 1.2–2.2)
ALBUMIN: 3.9 g/dL (ref 3.5–4.8)
ALT: 7 IU/L (ref 0–44)
AST: 15 IU/L (ref 0–40)
Alkaline Phosphatase: 142 IU/L — ABNORMAL HIGH (ref 39–117)
BILIRUBIN TOTAL: 0.3 mg/dL (ref 0.0–1.2)
BUN / CREAT RATIO: 11 (ref 10–24)
BUN: 11 mg/dL (ref 8–27)
CHLORIDE: 105 mmol/L (ref 96–106)
CO2: 24 mmol/L (ref 20–29)
Calcium: 9.1 mg/dL (ref 8.6–10.2)
Creatinine, Ser: 1.02 mg/dL (ref 0.76–1.27)
GFR calc non Af Amer: 73 mL/min/{1.73_m2} (ref >59)
GFR, EST AFRICAN AMERICAN: 84 mL/min/{1.73_m2} (ref >59)
Globulin, Total: 2.5 g/dL (ref 1.5–4.5)
Glucose: 104 mg/dL — ABNORMAL HIGH (ref 65–99)
POTASSIUM: 4.9 mmol/L (ref 3.5–5.2)
SODIUM: 141 mmol/L (ref 134–144)
TOTAL PROTEIN: 6.4 g/dL (ref 6.0–8.5)

## 2017-01-05 LAB — TSH: TSH: 1.13 u[IU]/mL (ref 0.450–4.500)

## 2017-02-12 ENCOUNTER — Encounter (HOSPITAL_COMMUNITY): Payer: Self-pay

## 2017-02-12 ENCOUNTER — Encounter (HOSPITAL_COMMUNITY): Payer: Medicare HMO | Attending: Oncology

## 2017-02-12 ENCOUNTER — Other Ambulatory Visit: Payer: Self-pay

## 2017-02-12 VITALS — BP 120/58 | HR 64 | Temp 98.1°F | Resp 18

## 2017-02-12 DIAGNOSIS — E876 Hypokalemia: Secondary | ICD-10-CM | POA: Insufficient documentation

## 2017-02-12 DIAGNOSIS — Z7982 Long term (current) use of aspirin: Secondary | ICD-10-CM | POA: Insufficient documentation

## 2017-02-12 DIAGNOSIS — I2510 Atherosclerotic heart disease of native coronary artery without angina pectoris: Secondary | ICD-10-CM | POA: Insufficient documentation

## 2017-02-12 DIAGNOSIS — D509 Iron deficiency anemia, unspecified: Secondary | ICD-10-CM | POA: Insufficient documentation

## 2017-02-12 DIAGNOSIS — F17210 Nicotine dependence, cigarettes, uncomplicated: Secondary | ICD-10-CM | POA: Insufficient documentation

## 2017-02-12 DIAGNOSIS — I10 Essential (primary) hypertension: Secondary | ICD-10-CM | POA: Insufficient documentation

## 2017-02-12 DIAGNOSIS — C19 Malignant neoplasm of rectosigmoid junction: Secondary | ICD-10-CM | POA: Insufficient documentation

## 2017-02-12 DIAGNOSIS — I252 Old myocardial infarction: Secondary | ICD-10-CM | POA: Insufficient documentation

## 2017-02-12 DIAGNOSIS — J439 Emphysema, unspecified: Secondary | ICD-10-CM | POA: Insufficient documentation

## 2017-02-12 DIAGNOSIS — K219 Gastro-esophageal reflux disease without esophagitis: Secondary | ICD-10-CM | POA: Insufficient documentation

## 2017-02-12 DIAGNOSIS — M7989 Other specified soft tissue disorders: Secondary | ICD-10-CM | POA: Insufficient documentation

## 2017-02-12 DIAGNOSIS — E785 Hyperlipidemia, unspecified: Secondary | ICD-10-CM | POA: Insufficient documentation

## 2017-02-12 DIAGNOSIS — C180 Malignant neoplasm of cecum: Secondary | ICD-10-CM | POA: Insufficient documentation

## 2017-02-12 DIAGNOSIS — Z452 Encounter for adjustment and management of vascular access device: Secondary | ICD-10-CM

## 2017-02-12 DIAGNOSIS — R918 Other nonspecific abnormal finding of lung field: Secondary | ICD-10-CM | POA: Insufficient documentation

## 2017-02-12 DIAGNOSIS — Z95828 Presence of other vascular implants and grafts: Secondary | ICD-10-CM

## 2017-02-12 DIAGNOSIS — C169 Malignant neoplasm of stomach, unspecified: Secondary | ICD-10-CM | POA: Diagnosis not present

## 2017-02-12 DIAGNOSIS — Z9889 Other specified postprocedural states: Secondary | ICD-10-CM | POA: Insufficient documentation

## 2017-02-12 DIAGNOSIS — Z903 Acquired absence of stomach [part of]: Secondary | ICD-10-CM | POA: Insufficient documentation

## 2017-02-12 DIAGNOSIS — I700 Atherosclerosis of aorta: Secondary | ICD-10-CM | POA: Insufficient documentation

## 2017-02-12 DIAGNOSIS — Z9049 Acquired absence of other specified parts of digestive tract: Secondary | ICD-10-CM | POA: Insufficient documentation

## 2017-02-12 DIAGNOSIS — Z800 Family history of malignant neoplasm of digestive organs: Secondary | ICD-10-CM | POA: Insufficient documentation

## 2017-02-12 DIAGNOSIS — Z8711 Personal history of peptic ulcer disease: Secondary | ICD-10-CM | POA: Insufficient documentation

## 2017-02-12 MED ORDER — HEPARIN SOD (PORK) LOCK FLUSH 100 UNIT/ML IV SOLN
500.0000 [IU] | Freq: Once | INTRAVENOUS | Status: AC
  Administered 2017-02-12: 500 [IU] via INTRAVENOUS

## 2017-02-12 MED ORDER — SODIUM CHLORIDE 0.9% FLUSH
10.0000 mL | INTRAVENOUS | Status: DC | PRN
  Administered 2017-02-12: 10 mL via INTRAVENOUS
  Filled 2017-02-12: qty 10

## 2017-02-12 NOTE — Progress Notes (Signed)
Steven Drake presented for Portacath access and flush. Portacath located right chest wall accessed with  H 20 needle. Good blood return present. Portacath flushed with 82ml NS and 500U/44ml Heparin and needle removed intact. Procedure without incident. Patient tolerated procedure well. Patient tolerated treatment without incidence. Patient discharged ambulatory and in stable condition from clinic. Patient to follow up as scheduled.

## 2017-02-12 NOTE — Patient Instructions (Signed)
[facility] Discharge Instructions  RECOMMENDATIONS MADE BY THE CONSULTANT AND ANY TEST RESULTS WILL BE SENT TO YOUR REFERRING PHYSICIAN.  You had your port flushed today. Follow up as scheduled.  Thank you for choosing [facility] at [facility] to provide your oncology and hematology care.  To afford each patient quality time with our provider, please arrive at least 15 minutes before your scheduled appointment time.    If you have a lab appointment with the Cancer Center please come in thru the  Main Entrance and check in at the main information desk  You need to re-schedule your appointment should you arrive 10 or more minutes late.  We strive to give you quality time with our providers, and arriving late affects you and other patients whose appointments are after yours.  Also, if you no show three or more times for appointments you may be dismissed from the clinic at the providers discretion.     Again, thank you for choosing [facility].  Our hope is that these requests will decrease the amount of time that you wait before being seen by our physicians.       _____________________________________________________________  Should you have questions after your visit to  Cancer Center, please contact our office at [phone] between the hours of 8:30 a.m. and 4:30 p.m.  Voicemails left after 4:30 p.m. will not be returned until the following business day.  For prescription refill requests, have your pharmacy contact our office.       Resources For Cancer Patients and their Caregivers ? American Cancer Society: Can assist with transportation, wigs, general needs, runs Look Good Feel Better.        1-888-227-6333 ? Cancer Care: Provides financial assistance, online support groups, medication/co-pay assistance.  1-800-813-HOPE (4673) ? [facility] Assists [place] cancer  patients and their families through emotional , educational and financial support.  336-427-4357 ? [place] DSS Where to apply for food stamps, Medicaid and utility assistance. 336-342-1394 ? [facility]: Transportation to medical appointments. 336-347-2287 ? Social Security Administration: May apply for disability if have a Stage IV cancer. 336-342-7796 1-800-772-1213 ? [place] Aging, Disability and Transit Services: Assists with nutrition, care and transit needs. 336-349-2343  Cancer Center Support Programs: @[email or web address]@ > Cancer Support Group  2nd Tuesday of the month 1pm-2pm, Journey Room  > Creative Journey  3rd Tuesday of the month 1130am-1pm, Journey Room  > Look Good Feel Better  1st Wednesday of the month 10am-12 noon, Journey Room (Call American Cancer Society to register 1-800-395-5775)    

## 2017-02-13 ENCOUNTER — Ambulatory Visit (INDEPENDENT_AMBULATORY_CARE_PROVIDER_SITE_OTHER): Payer: Medicare HMO | Admitting: Nurse Practitioner

## 2017-02-13 ENCOUNTER — Encounter: Payer: Self-pay | Admitting: Nurse Practitioner

## 2017-02-13 VITALS — BP 123/73 | HR 55 | Temp 96.7°F | Ht 71.0 in | Wt 167.8 lb

## 2017-02-13 DIAGNOSIS — C169 Malignant neoplasm of stomach, unspecified: Secondary | ICD-10-CM | POA: Diagnosis not present

## 2017-02-13 DIAGNOSIS — C189 Malignant neoplasm of colon, unspecified: Secondary | ICD-10-CM | POA: Diagnosis not present

## 2017-02-13 NOTE — Progress Notes (Signed)
cc'd to pcp 

## 2017-02-13 NOTE — Progress Notes (Signed)
Referring Provider: Dettinger, Fransisca Kaufmann, MD Primary Care Physician:  Dettinger, Fransisca Kaufmann, MD Primary GI:  Dr. Gala Romney  Chief Complaint  Patient presents with  . colon cancer    f/u, doing ok    HPI:   Steven Drake is a 71 y.o. male who presents for follow-up on colon cancer.  The patient was last seen in our office 08/14/2016 for malignant neoplasm of the stomach and malignant neoplasm of the colon. He has a history of endoscopy and colonoscopy completed on 06/17/2015 which found cecal neoplasm, large polyp in the mid sigmoid section with piecemeal removal and tattooing, cecal lesion found to be adenocarcinoma, hepatic lesion focal adenocarcinoma, sigmoid lesion high-grade dysplasia. Pyloric lesion was also found to be positive for invasive, poorly differentiated adenocarcinoma.  Surgery completed on 08/02/2015 include laparoscopic subtotal colectomy and distal gastrectomy (antral resection of tumor of stomach)by Dr. Barry Dienes in [place]. Surgical pathology found invasive adenocarcinoma, poorly differentiated, 3.0 cm, extending into the muscularis propria, 8/16 positive lymph nodes for metastatic disease, negative margins. He underwent chemotherapy and surgical resection of both the antrum and the colon.  At his last visit he was doing well, good energy, still working full-time.  No overt GI symptoms.  He was due for 1 year repeat colonoscopy.  Flexible sigmoidoscopy was recommended.  Lexi completed 10/05/2016 which found stenotic sigmoid ileal anastomosis with some abnormal appearing mucosa with the appearance like scar tissue, status post biopsy.  Surgical pathology found the biopsies to be colonic mucosa with patchy ulceration and Marked acute and chronic inflammation.  Patient was told biopsy found inflammation at the site of surgery with no evidence of tumor.  Recommended one-year follow-up.  Patient continues to see oncology and last saw on 12/27/2016.  Noted to enlarging pulmonary  nodules on chest CT in July both mildly hypermetabolic and suspicious for metastatic disease.  Also noted new peripheral left upper lobe pulmonary nodule.  No new metastatic or recurrent disease otherwise.  Consideration for possible biopsy of the right middle lobe nodule because it is greater than 1 cm versus continued observation.  The patient selected continued observation with PET/CT in 3 months.  Today he states he's doing ok. His energy is "wide open." He is still working full time. Denies abdominal pain, N/V, fever, chills, unintentional weight loss, hematochezia, melena. Denies chest pain, dyspnea, dizziness, lightheadedness, syncope, near syncope. Denies any other upper or lower GI symptoms.  Past Medical History:  Diagnosis Date  . Adenocarcinoma of cecum ([place]) 07/01/2015  . 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 CAD (coronary artery disease) RCA non dominant vessel, LAD 40%, 80-90% 2nd diag EF 55%  01/19/2013   Dr. Claiborne Billings Riley 03-05-15 Epic.  . Family history of colon cancer   . Family history of stomach cancer   . Gastric adenocarcinoma ([place]) 06/29/2015  . Gastric ulcer    many yrs ago  . GERD (gastroesophageal reflux disease)   . Hyperlipidemia LDL goal < 70 01/19/2013  . Hypertension   . Hypokalemia 01/20/2013  . Metabolic syndrome, with mildly elevated HgBA1C 01/20/2013  . Pulmonary nodules 01/19/2013  . STEMI (ST elevation myocardial infarction), 01/19/13 01/19/2013  . Tobacco abuse 01/20/2013  . Transfusion history    with gastric ulcer- many yrs ago    Past Surgical History:  Procedure Laterality Date  . APPENDECTOMY    . BIOPSY  10/05/2016   Procedure: BIOPSY;  Surgeon: Daneil Dolin, MD;  Location: [facility];  Service: Endoscopy;;  sigmoid anastomosis  . CATARACT EXTRACTION, BILATERAL    . COLONOSCOPY N/A 06/10/2015   RMR: cecal neoplasm  ? biopsied. large polyp at the hepatic flexure removed with piecmeal polypectomy and APC ablation. lesion  tattooed. Large polyp in the mid sigmoid status post piecemeal hot snare debulking and tattooing. this lesion not completely removed. scatterd pancolonic diverticulsosis. no speciments collected.   . ESOPHAGOGASTRODUODENOSCOPY N/A 06/10/2015   Procedure: ESOPHAGOGASTRODUODENOSCOPY (EGD);  Surgeon: Daneil Dolin, MD;  Location: [facility];  Service: Endoscopy;  Laterality: N/A;  . ESOPHAGOGASTRODUODENOSCOPY N/A 06/17/2015   RMR: normal esophagus small hiatal hernia. Abnormal nodular antrum .pyloric channel status post biopsy.   Marland Kitchen FLEXIBLE SIGMOIDOSCOPY N/A 10/05/2016   Procedure: FLEXIBLE SIGMOIDOSCOPY;  Surgeon: Daneil Dolin, MD;  Location: [facility];  Service: Endoscopy;  Laterality: N/A;  130   . HERNIA REPAIR Right   . LAPAROSCOPIC SUBTOTAL COLECTOMY N/A 08/02/2015   Procedure: LAPAROSCOPIC SUBTOTAL COLECTOMY  AND DISTAL GASTRECTOMY ;  Surgeon: Stark Klein, MD;  Location: [facility];  Service: General;  Laterality: N/A;  . LEFT HEART CATH N/A 01/19/2013   Procedure: LEFT HEART CATH;  Surgeon: Troy Sine, MD;  Location: [facility] CATH LAB;  Service: Cardiovascular;  Laterality: N/A;    Current Outpatient Medications  Medication Sig Dispense Refill  . acetaminophen (TYLENOL) 325 MG tablet Take 650 mg by mouth daily as needed for moderate pain or headache.    Marland Kitchen aspirin 81 MG EC tablet Take 81 mg by mouth daily.  12  . atorvastatin (LIPITOR) 40 MG tablet TAKE ONE TABLET BY MOUTH EVERY DAY 90 tablet 0  . clopidogrel (PLAVIX) 75 MG tablet TAKE 1 TABLET BY MOUTH DAILY WITH BREAKFAST 30 tablet 11  . diphenoxylate-atropine (LOMOTIL) 2.5-0.025 MG tablet TAKE 1 TO 2 TABLETS 4 TIMES DAILY AS NEEDED FOR DIARRHEA 30 tablet 1  . lidocaine-prilocaine (EMLA) cream APPLY A QUARTER SIZE AMOUNT TO PORT SITE 1 HOUR PRIOR TO CHEMO. DO NOT RUB IN. COVER WITH PLASTIC WRAP.  3  . lisinopril (PRINIVIL,ZESTRIL) 5 MG tablet TAKE ONE TABLET BY MOUTH EVERY DAY 90 tablet 0  . ondansetron (ZOFRAN) 8 MG tablet Take 1  tablet (8 mg total) by mouth every 8 (eight) hours as needed for nausea or vomiting. 30 tablet 2  . pantoprazole (PROTONIX) 40 MG tablet TAKE 1 TABLET BY MOUTH EVERY DAY AT 6:00 AM 30 tablet 11  . prochlorperazine (COMPAZINE) 10 MG tablet Take 1 tablet (10 mg total) by mouth every 6 (six) hours as needed for nausea or vomiting. 30 tablet 2   No current facility-administered medications for this visit.     Allergies as of 02/13/2017 - Review Complete 02/13/2017  Allergen Reaction Noted  . Contrast media [iodinated diagnostic agents] Itching and Rash 05/20/2015    Family History  Problem Relation Age of Onset  . Heart attack Mother   . Colon cancer Father        dx in his 70s  . Colon cancer Sister        dx possibly in her early 18s  . Stomach cancer Sister        dx in her 58s  . Colon cancer Paternal Uncle     Social History   Socioeconomic History  . Marital status: Widowed    Spouse name: None  . Number of children: 0  . Years of education: None  . Highest education level: None  Social Needs  . Financial resource strain: None  . Food  insecurity - worry: None  . Food insecurity - inability: None  . Transportation needs - medical: None  . Transportation needs - non-medical: None  Occupational History  . Occupation: farmer  Tobacco Use  . Smoking status: Current Every Day Smoker    Packs/day: 1.00    Years: 58.00    Pack years: 58.00    Types: Cigarettes    Start date: 01/21/1963  . Smokeless tobacco: Never Used  . Tobacco comment: one pack daily  Substance and Sexual Activity  . Alcohol use: No    Alcohol/week: 0.0 oz    Comment: none in 15 yrs -heavy user-none now.  . Drug use: No  . Sexual activity: None  Other Topics Concern  . None  Social History Narrative  . None    Review of Systems: General: Negative for anorexia, weight loss, fever, chills, fatigue, weakness.  ENT: Negative for hoarseness, difficulty swallowing. CV: Negative for chest pain,  angina, palpitations, peripheral edema.  Respiratory: Negative for dyspnea at rest, cough, sputum, wheezing.  GI: See history of present illness. Endo: Negative for unusual weight change.  Heme: Negative for bruising or bleeding.   Physical Exam: BP 123/73   Pulse (!) 55   Temp (!) 96.7 F (35.9 C) (Oral)   Ht 5\' 11"  (1.803 m)   Wt 167 lb 12.8 oz (76.1 kg)   BMI 23.40 kg/m  General:   Alert and oriented. Pleasant and cooperative. Well-nourished and well-developed.  Eyes:  Without icterus, sclera clear and conjunctiva pink.  Ears:  Normal auditory acuity. Cardiovascular:  S1, S2 present without murmurs appreciated. Extremities without clubbing or edema. Respiratory:  Clear to auscultation bilaterally. No wheezes, rales, or rhonchi. No distress.  Gastrointestinal:  +BS, soft, non-tender and non-distended. No HSM noted. No guarding or rebound. No masses appreciated.  Rectal:  Deferred  Musculoskalatal:  Symmetrical without gross deformities. Neurologic:  Alert and oriented x4;  grossly normal neurologically. Psych:  Alert and cooperative. Normal mood and affect. Heme/Lymph/Immune: No excessive bruising noted.    02/13/2017 9:45 AM   Disclaimer: This note was dictated with voice recognition software. Similar sounding words can inadvertently be transcribed and may not be corrected upon review.

## 2017-02-13 NOTE — Assessment & Plan Note (Signed)
The patient has a history of colon cancer status post subtotal colectomy.  Rare diarrhea, otherwise no GI symptoms.  Denies acute changes in bowel movements, fevers, chills, weight loss.  Continues to see oncology.  If there is concern about a enlarging pulmonary nodule but they will reevaluate with a PET scan at the end of January.  There is a possibility of pulmonary biopsy.  Further treatment recommendations per oncology based on these results.  Return for follow-up in 1 year.  Call if he has any questions or concerns before then.

## 2017-02-13 NOTE — Patient Instructions (Signed)
1. Continue your current medications. 2. Follow-up with oncology based on their recommendations. 3. Return for follow-up in 1 year. 4. Call us before then if you have any questions or concerns.

## 2017-02-13 NOTE — Assessment & Plan Note (Signed)
Patient has a history of gastric cancer status post distal gastrectomy/antral resection of the tumor of stomach.  The patient currently denies any symptoms.  He continues to see oncology.  He has good energy, no overt GERD symptoms, abdominal pain, nausea, vomiting.  Recommend he continue to monitor his symptoms.  Follow-up with oncology as they recommend.  Continue current prescription medications.  Return for follow-up in 1 year.

## 2017-03-06 ENCOUNTER — Other Ambulatory Visit: Payer: Self-pay | Admitting: Cardiovascular Disease

## 2017-03-06 ENCOUNTER — Other Ambulatory Visit: Payer: Self-pay | Admitting: Family Medicine

## 2017-03-22 DIAGNOSIS — C163 Malignant neoplasm of pyloric antrum: Secondary | ICD-10-CM | POA: Diagnosis not present

## 2017-03-22 DIAGNOSIS — R918 Other nonspecific abnormal finding of lung field: Secondary | ICD-10-CM | POA: Diagnosis not present

## 2017-03-22 DIAGNOSIS — C189 Malignant neoplasm of colon, unspecified: Secondary | ICD-10-CM | POA: Diagnosis not present

## 2017-03-26 ENCOUNTER — Ambulatory Visit (HOSPITAL_COMMUNITY)
Admission: RE | Admit: 2017-03-26 | Discharge: 2017-03-26 | Disposition: A | Discharge status: Home / Self Care | Payer: Medicare HMO | Source: Ambulatory Visit | Attending: Oncology | Admitting: Oncology

## 2017-03-26 DIAGNOSIS — R918 Other nonspecific abnormal finding of lung field: Secondary | ICD-10-CM | POA: Diagnosis not present

## 2017-03-26 DIAGNOSIS — C169 Malignant neoplasm of stomach, unspecified: Secondary | ICD-10-CM | POA: Insufficient documentation

## 2017-03-26 DIAGNOSIS — J439 Emphysema, unspecified: Secondary | ICD-10-CM | POA: Diagnosis not present

## 2017-03-26 DIAGNOSIS — I700 Atherosclerosis of aorta: Secondary | ICD-10-CM | POA: Diagnosis not present

## 2017-03-26 DIAGNOSIS — J320 Chronic maxillary sinusitis: Secondary | ICD-10-CM | POA: Insufficient documentation

## 2017-03-26 DIAGNOSIS — I2510 Atherosclerotic heart disease of native coronary artery without angina pectoris: Secondary | ICD-10-CM | POA: Diagnosis not present

## 2017-03-26 DIAGNOSIS — C180 Malignant neoplasm of cecum: Secondary | ICD-10-CM | POA: Diagnosis not present

## 2017-03-26 LAB — GLUCOSE, CAPILLARY: Glucose-Capillary: 109 mg/dL — ABNORMAL HIGH (ref 65–99)

## 2017-03-26 MED ORDER — FLUDEOXYGLUCOSE F - 18 (FDG) INJECTION
8.3200 | Freq: Once | INTRAVENOUS | Status: AC | PRN
  Administered 2017-03-26: 8.32 via INTRAVENOUS

## 2017-03-29 ENCOUNTER — Encounter (HOSPITAL_COMMUNITY): Payer: Self-pay | Admitting: Oncology

## 2017-03-29 ENCOUNTER — Other Ambulatory Visit: Payer: Self-pay

## 2017-03-29 ENCOUNTER — Inpatient Hospital Stay (HOSPITAL_COMMUNITY): Payer: Medicare HMO

## 2017-03-29 ENCOUNTER — Inpatient Hospital Stay (HOSPITAL_COMMUNITY): Payer: Medicare HMO | Attending: Internal Medicine | Admitting: Oncology

## 2017-03-29 VITALS — BP 155/62 | HR 53 | Temp 98.0°F | Resp 18 | Wt 168.3 lb

## 2017-03-29 DIAGNOSIS — C169 Malignant neoplasm of stomach, unspecified: Secondary | ICD-10-CM

## 2017-03-29 DIAGNOSIS — F17210 Nicotine dependence, cigarettes, uncomplicated: Secondary | ICD-10-CM | POA: Diagnosis not present

## 2017-03-29 DIAGNOSIS — M7989 Other specified soft tissue disorders: Secondary | ICD-10-CM | POA: Insufficient documentation

## 2017-03-29 DIAGNOSIS — C180 Malignant neoplasm of cecum: Secondary | ICD-10-CM

## 2017-03-29 DIAGNOSIS — Z95828 Presence of other vascular implants and grafts: Secondary | ICD-10-CM

## 2017-03-29 DIAGNOSIS — C19 Malignant neoplasm of rectosigmoid junction: Secondary | ICD-10-CM | POA: Diagnosis not present

## 2017-03-29 DIAGNOSIS — R918 Other nonspecific abnormal finding of lung field: Secondary | ICD-10-CM

## 2017-03-29 LAB — COMPREHENSIVE METABOLIC PANEL
ALBUMIN: 3.6 g/dL (ref 3.5–5.0)
ALK PHOS: 120 U/L (ref 38–126)
ALT: 11 U/L — AB (ref 17–63)
AST: 15 U/L (ref 15–41)
Anion gap: 10 (ref 5–15)
BUN: 14 mg/dL (ref 6–20)
CALCIUM: 8.8 mg/dL — AB (ref 8.9–10.3)
CO2: 23 mmol/L (ref 22–32)
CREATININE: 1.1 mg/dL (ref 0.61–1.24)
Chloride: 107 mmol/L (ref 101–111)
GFR calc Af Amer: >60 mL/min (ref >60)
GFR calc non Af Amer: >60 mL/min (ref >60)
GLUCOSE: 84 mg/dL (ref 65–99)
Potassium: 4.1 mmol/L (ref 3.5–5.1)
SODIUM: 140 mmol/L (ref 135–145)
Total Bilirubin: 0.5 mg/dL (ref 0.3–1.2)
Total Protein: 6.7 g/dL (ref 6.5–8.1)

## 2017-03-29 LAB — FERRITIN: Ferritin: 21 ng/mL — ABNORMAL LOW (ref 24–336)

## 2017-03-29 LAB — IRON AND TIBC
Iron: 42 ug/dL — ABNORMAL LOW (ref 45–182)
Saturation Ratios: 14 % — ABNORMAL LOW (ref 17.9–39.5)
TIBC: 297 ug/dL (ref 250–450)
UIBC: 255 ug/dL

## 2017-03-29 LAB — CBC WITH DIFFERENTIAL/PLATELET
BASOS PCT: 0 %
Basophils Absolute: 0 10*3/uL (ref 0.0–0.1)
EOS ABS: 0.5 10*3/uL (ref 0.0–0.7)
Eosinophils Relative: 5 %
HEMATOCRIT: 39.2 % (ref 39.0–52.0)
Hemoglobin: 12.5 g/dL — ABNORMAL LOW (ref 13.0–17.0)
Lymphocytes Relative: 23 %
Lymphs Abs: 2 10*3/uL (ref 0.7–4.0)
MCH: 29.4 pg (ref 26.0–34.0)
MCHC: 31.9 g/dL (ref 30.0–36.0)
MCV: 92.2 fL (ref 78.0–100.0)
MONO ABS: 0.8 10*3/uL (ref 0.1–1.0)
MONOS PCT: 9 %
NEUTROS ABS: 5.7 10*3/uL (ref 1.7–7.7)
Neutrophils Relative %: 63 %
Platelets: 192 10*3/uL (ref 150–400)
RBC: 4.25 MIL/uL (ref 4.22–5.81)
RDW: 14.2 % (ref 11.5–15.5)
WBC: 9.1 10*3/uL (ref 4.0–10.5)

## 2017-03-29 MED ORDER — HEPARIN SOD (PORK) LOCK FLUSH 100 UNIT/ML IV SOLN
500.0000 [IU] | Freq: Once | INTRAVENOUS | Status: AC
  Administered 2017-03-29: 500 [IU] via INTRAVENOUS

## 2017-03-29 MED ORDER — SODIUM CHLORIDE 0.9% FLUSH
10.0000 mL | INTRAVENOUS | Status: DC | PRN
  Administered 2017-03-29: 10 mL via INTRAVENOUS
  Filled 2017-03-29: qty 10

## 2017-03-29 NOTE — Progress Notes (Signed)
Steven Drake presented for Portacath access and flush.  Proper placement of portacath confirmed by CXR.  Portacath located right chest wall accessed with  H 20 needle.  Good blood return present. Portacath flushed with 16ml NS and 500U/35ml Heparin and needle removed intact.  Procedure tolerated well and without incident.  Discharged ambulatory.

## 2017-03-29 NOTE — Progress Notes (Signed)
[name]  Progress Note  Patient Care Team: Dettinger, Fransisca Kaufmann, MD as PCP - General (Family Medicine) Gala Romney Cristopher Estimable, MD as Consulting Physician (Gastroenterology)  CHIEF COMPLAINTS/PURPOSE OF CONSULTATION:    Gastric adenocarcinoma [facility])   06/17/2015 Procedure    Stomach biopsy of pyloric lesion, Dr. 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      06/28/2015 PET scan    Intensely hypermetabolic cecal mass. No definitive evidence of metastatic disease. Scattered upper lobe predominant pulmonary nodules are too small for PET resolution.       08/02/2015 Procedure    Antral resection of tumor of stomach by Dr. Stark Klein      08/06/2015 Pathology Results    Invasive adenocarcinoma, poorly differentiated, 3.0 cm, extending into the muscularis propria, LVI +, 8/16 positive lymph nodes for metastatic disease, negative margins.      08/06/2015 Cancer Staging    pT2N3A      09/15/2015 Procedure    Port placed by IR      09/20/2015 - 10/18/2015 Chemotherapy    FOLFOX x 2 cycles      09/29/2015 Treatment Plan Change    5 FU bolus is discontinued      10/07/2015 Survivorship    Genetic Counseling consultation with [name]- Despite our recommendation, [name] did not wish to pursue genetic testing at today's visit. We understand this decision, and remain available to coordinate genetic testing at any time in the future. We; therefore, recommend [name] continue to follow the cancer screening guidelines given by his primary healthcare provider.      10/18/2015 Treatment Plan Change    Patient refuses future chemotherapy treatment      09/15/2016 Imaging    CT chest w/o contrast: IMPRESSION: 1. While several of the previously noted new pulmonary nodules have resolved compared to the prior study,  indicative of benign nodules, there are 2 enlarging nodules. One of these in the left lower lobe has increased from 5 mm to 8 mm in size. The largest nodule (visible in retrospect on the prior study) has also increased in size and currently measures 10 x 8 x 13 mm in the medial aspect of the right middle lobe (axial image 96 of series 4). These findings are suspicious for metastatic disease, although either of these lesions could alternatively represent a primary bronchogenic malignancy given the background of smoking related changes in the lungs. Further evaluation with PET-CT is recommended at this time. 2. Mild diffuse bronchial thickening with mild to moderate centrilobular and paraseptal emphysema; imaging findings suggestive of underlying COPD. 3. Aortic atherosclerosis, in addition to left main and 3 vessel coronary artery disease. Assessment for potential risk factor modification, dietary therapy or pharmacologic therapy may be warranted, if clinically indicated. 4. Additional incidental findings, as above.       Adenocarcinoma of cecum ([place])   06/10/2015 Procedure    Colonoscopy with Dr. Gala Romney, Cecal neoplasm. large polyp at hepatic flexure. lesion tattooed, large polyp in mid sigmoid (lesion not completely removed)      06/10/2015 Pathology Results    Cecal mass - adenocarcinoma, polyp at hepatic flexure foci of adenocarcinoma arising from tubulovillous adenoma      06/28/2015 PET scan    Intensely hypermetabolic cecal mass. No definitive evidence of metastatic disease. Scattered upper lobe predominant pulmonary nodules are  too small for PET resolution.       08/02/2015 Procedure    Segmental resection of tumor, right, transverse, descending colon by Dr. Stark Klein      08/06/2015 Pathology Results    Invasive adenocarcinoma, 4.3 cm, well-differentiated, extending through the muscularis propria into the pericolonic soft tissue. 2/14 lymph nodes positive for metastatic  disease. Surgical margins are negative.      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 Gala Romney, Cecal neoplasm. large polyp at hepatic flexure. lesion tattooed, large polyp in mid sigmoid (lesion not completely removed)      06/10/2015 Pathology Results    Polyp sigmoid - tubulovillous adenoma with at least high grade dysplasia cannot rule out focal adenocarcinoma      06/28/2015 PET scan    Intensely hypermetabolic cecal mass. No definitive evidence of metastatic disease. Scattered upper lobe predominant pulmonary nodules are too small for PET resolution.       08/02/2015 Procedure    Segmental resection for tumor of rectosigmoid colon by Dr. Stark Klein.      08/06/2015 Pathology Results    Invasive adenocarcinoma, 2.6 cm, well-differentiated, extending through the muscularis propria and into pericolonic soft tissue, 0/10 nodes involved, negative resection margins. No LVI.      08/06/2015 Cancer Staging    pT3N0     06/20/16 CT C/A/P: 1. Four new small solid scattered pulmonary nodules, largest 5 mm in the superior segment left lower lobe, suspicious for pulmonary metastases. Recommend attention on short-term follow-up chest CT in 3 months. 2. No evidence of metastatic disease in the abdomen or pelvis. 3. Status post distal gastrectomy and subtotal colectomy without complication. 4. Additional findings include aortic atherosclerosis, left main and 3 vessel coronary atherosclerosis, mild emphysema with diffuse bronchial wall thickening suggesting COPD and mildly enlarged prostate.  HISTORY OF PRESENTING ILLNESS:  Steven Drake 72 y.o. male is here for further follow-up of T2N3 Gastric Carcinoma, T3N1B adenocarcinoma of the cecum and T3N0 adenocarcinoma of the rectosigmoid junction.   Patient is doing well today.  He is accompanied with his wife.  He states "I go full throttle every day".   He continues to have occasional swelling in bilateral lower extremities.  He continues to work 7 days/week.  He continues to smoke 1 pack/day.  He denies chest pain, shortness of breath, abdominal pain, diarrhea, constipation, weight loss, loss of appetite or any other concerns.  He is not short of breath.  He has a great appetite.  He offers no further complaints today.    MEDICAL HISTORY:  Past Medical History:  Diagnosis Date  . Adenocarcinoma of cecum ([place]) 07/01/2015  . 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 CAD (coronary artery disease) RCA non dominant vessel, LAD 40%, 80-90% 2nd diag EF 55%  01/19/2013   Dr. Claiborne Billings Maeser 03-05-15 Epic.  . Family history of colon cancer   . Family history of stomach cancer   . Gastric adenocarcinoma ([place]) 06/29/2015  . Gastric ulcer    many yrs ago  . GERD (gastroesophageal reflux disease)   . Hyperlipidemia LDL goal < 70 01/19/2013  . Hypertension   . Hypokalemia 01/20/2013  . Metabolic syndrome, with mildly elevated HgBA1C 01/20/2013  . Pulmonary nodules 01/19/2013  . STEMI (ST elevation myocardial infarction), 01/19/13 01/19/2013  . Tobacco abuse 01/20/2013  . Transfusion history    with gastric  ulcer- many yrs ago    SURGICAL HISTORY: Past Surgical History:  Procedure Laterality Date  . APPENDECTOMY    . BIOPSY  10/05/2016   Procedure: BIOPSY;  Surgeon: Daneil Dolin, MD;  Location[facility];  Service: Endoscopy;;  sigmoid anastomosis  . CATARACT EXTRACTION, BILATERAL    . COLONOSCOPY N/A 06/10/2015   RMR: cecal neoplasm  ? biopsied. large polyp at the hepatic flexure removed with piecmeal polypectomy and APC ablation. lesion tattooed. Large polyp in the mid sigmoid status post piecemeal hot snare debulking and tattooing. this lesion not completely removed. scatterd pancolonic diverticulsosis. no speciments collected.   . ESOPHAGOGASTRODUODENOSCOPY N/A 06/10/2015   Procedure: ESOPHAGOGASTRODUODENOSCOPY (EGD);  Surgeon:  Daneil Dolin, MD;  Location: [facility];  Service: Endoscopy;  Laterality: N/A;  . ESOPHAGOGASTRODUODENOSCOPY N/A 06/17/2015   RMR: normal esophagus small hiatal hernia. Abnormal nodular antrum .pyloric channel status post biopsy.   Marland Kitchen FLEXIBLE SIGMOIDOSCOPY N/A 10/05/2016   Procedure: FLEXIBLE SIGMOIDOSCOPY;  Surgeon: Daneil Dolin, MD;  Location: [facility];  Service: Endoscopy;  Laterality: N/A;  130   . HERNIA REPAIR Right   . LAPAROSCOPIC SUBTOTAL COLECTOMY N/A 08/02/2015   Procedure: LAPAROSCOPIC SUBTOTAL COLECTOMY  AND DISTAL GASTRECTOMY ;  Surgeon: Stark Klein, MD;  Location: [facility];  Service: General;  Laterality: N/A;  . LEFT HEART CATH N/A 01/19/2013   Procedure: LEFT HEART CATH;  Surgeon: Troy Sine, MD;  Location: [facility] CATH LAB;  Service: Cardiovascular;  Laterality: N/A;    SOCIAL HISTORY: Social History   Socioeconomic History  . Marital status: Widowed    Spouse name: Not on file  . Number of children: 0  . Years of education: Not on file  . Highest education level: Not on file  Social Needs  . Financial resource strain: Not on file  . Food insecurity - worry: Not on file  . Food insecurity - inability: Not on file  . Transportation needs - medical: Not on file  . Transportation needs - non-medical: Not on file  Occupational History  . Occupation: farmer  Tobacco Use  . Smoking status: Current Every Day Smoker    Packs/day: 1.00    Years: 58.00    Pack years: 58.00    Types: Cigarettes    Start date: 01/21/1963  . Smokeless tobacco: Never Used  . Tobacco comment: one pack daily  Substance and Sexual Activity  . Alcohol use: No    Alcohol/week: 0.0 oz    Comment: none in 15 yrs -heavy user-none now.  . Drug use: No  . Sexual activity: Not on file  Other Topics Concern  . Not on file  Social History Narrative  . Not on file  Widowed since 2003 Has 2 step kids Still smokes No ETOH use Retired. Used to drive a truck cross country for 45 years.     FAMILY HISTORY: Family History  Problem Relation Age of Onset  . Heart attack Mother   . Colon cancer Father        dx in his 51s  . Colon cancer Sister        dx possibly in her early 67s  . Stomach cancer Sister        dx in her 18s  . Colon cancer Paternal Uncle   1 brother and 1 sister living Oldest brother died; sister died from stomach cancer; Other sister died of colon cancer Father died of colon cancer at 66 years old. Mother died of  a heart attack in her 55s.  ALLERGIES:  is allergic to contrast media [iodinated diagnostic agents].  MEDICATIONS:  Current Outpatient Medications  Medication Sig Dispense Refill  . acetaminophen (TYLENOL) 325 MG tablet Take 650 mg by mouth daily as needed for moderate pain or headache.    Marland Kitchen aspirin 81 MG EC tablet Take 81 mg by mouth daily.  12  . atorvastatin (LIPITOR) 40 MG tablet TAKE ONE TABLET BY MOUTH EVERY DAY 90 tablet 2  . clopidogrel (PLAVIX) 75 MG tablet TAKE 1 TABLET BY MOUTH DAILY WITH BREAKFAST 30 tablet 11  . diphenoxylate-atropine (LOMOTIL) 2.5-0.025 MG tablet TAKE 1 TO 2 TABLETS 4 TIMES DAILY AS NEEDED FOR DIARRHEA 30 tablet 1  . lidocaine-prilocaine (EMLA) cream APPLY A QUARTER SIZE AMOUNT TO PORT SITE 1 HOUR PRIOR TO CHEMO. DO NOT RUB IN. COVER WITH PLASTIC WRAP.  3  . lisinopril (PRINIVIL,ZESTRIL) 5 MG tablet TAKE ONE TABLET BY MOUTH EVERY DAY 90 tablet 0  . ondansetron (ZOFRAN) 8 MG tablet Take 1 tablet (8 mg total) by mouth every 8 (eight) hours as needed for nausea or vomiting. 30 tablet 2  . pantoprazole (PROTONIX) 40 MG tablet TAKE 1 TABLET BY MOUTH EVERY DAY AT 6:00 AM 30 tablet 11  . prochlorperazine (COMPAZINE) 10 MG tablet Take 1 tablet (10 mg total) by mouth every 6 (six) hours as needed for nausea or vomiting. 30 tablet 2   No current facility-administered medications for this visit.    Review of Systems  Constitutional: Negative.  Negative for weight loss.       No loss of appetite  HENT: Negative.    Eyes: Negative.   Respiratory: Negative.  Negative for shortness of breath.   Cardiovascular: Positive for leg swelling (occasional). Negative for chest pain.  Gastrointestinal: Negative.  Negative for abdominal pain, constipation and diarrhea.  Genitourinary: Negative.   Musculoskeletal: Negative.   Skin: Negative.   Neurological: Tingling: chronic neuropathy in feet.  Endo/Heme/Allergies: Negative.   Psychiatric/Behavioral: Negative.   All other systems reviewed and are negative. 14 point ROS was done and is otherwise as detailed above or in HPI  PHYSICAL EXAMINATION: ECOG PERFORMANCE STATUS: 0 - Asymptomatic  Vitals:   03/29/17 1453  BP: (!) 155/62  Pulse: (!) 53  Resp: 18  Temp: 98 F (36.7 C)  SpO2: 99%   Filed Weights   03/29/17 1453  Weight: 168 lb 4.8 oz (76.3 kg)    Physical Exam  Constitutional: He is oriented to person, place, and time and well-developed, well-nourished, and in no distress.  HENT:  Head: Normocephalic and atraumatic.  Nose: Nose normal.  Mouth/Throat: Oropharynx is clear and moist. No oropharyngeal exudate.  Eyes: Conjunctivae and EOM are normal. Pupils are equal, round, and reactive to light. Right eye exhibits no discharge. Left eye exhibits no discharge. No scleral icterus.  Neck: Normal range of motion. Neck supple. No tracheal deviation present. No thyromegaly present.  Cardiovascular: Normal rate, regular rhythm and normal heart sounds. Exam reveals no gallop and no friction rub.  No murmur heard. Pulmonary/Chest: Effort normal and breath sounds normal. He has no wheezes. He has no rales.  Abdominal: Soft. Bowel sounds are normal. He exhibits no distension and no mass. There is no tenderness. There is no rebound and no guarding.  Musculoskeletal: Normal range of motion. He exhibits no edema.  Lymphadenopathy:    He has no cervical adenopathy.  Neurological: He is alert and oriented to person, place, and time. He has normal  reflexes. No  cranial nerve deficit. Gait normal. Coordination normal.  Skin: Skin is warm and dry. No rash noted.  Psychiatric: Mood, memory, affect and judgment normal.  Nursing note and vitals reviewed.  LABORATORY DATA:  I have reviewed the data as listed Lab Results  Component Value Date   WBC 8.8 01/05/2017   HGB 13.0 01/05/2017   HCT 40.5 01/05/2017   MCV 91 01/05/2017   PLT 233 01/05/2017   CMP     Component Value Date/Time   NA 141 01/05/2017 0842   K 4.9 01/05/2017 0842   CL 105 01/05/2017 0842   CO2 24 01/05/2017 0842   GLUCOSE 104 (H) 01/05/2017 0842   GLUCOSE 122 (H) 09/21/2016 1043   BUN 11 01/05/2017 0842   CREATININE 1.02 01/05/2017 0842   CREATININE 1.32 (H) 04/13/2015 1252   CALCIUM 9.1 01/05/2017 0842   PROT 6.4 01/05/2017 0842   ALBUMIN 3.9 01/05/2017 0842   AST 15 01/05/2017 0842   ALT 7 01/05/2017 0842   ALKPHOS 142 (H) 01/05/2017 0842   BILITOT 0.3 01/05/2017 0842   GFRNONAA 73 01/05/2017 0842   GFRAA 84 01/05/2017 0842     RADIOGRAPHIC STUDIES: I have personally reviewed the radiological images as listed and agreed with the findings in the report.  CT CAP w Contrast 06/20/2016 IMPRESSION: 1. Four new small solid scattered pulmonary nodules, largest 5 mm in the superior segment left lower lobe, suspicious for pulmonary metastases. Recommend attention on short-term follow-up chest CT in 3 months. 2. No evidence of metastatic disease in the abdomen or pelvis. 3. Status post distal gastrectomy and subtotal colectomy without complication. 4. Additional findings include aortic atherosclerosis, left main and 3 vessel coronary atherosclerosis, mild emphysema with diffuse bronchial wall thickening suggesting COPD and mildly enlarged prostate.  PATHOLOGY:   ASSESSMENT & PLAN:  Cancer Staging Adenocarcinoma of cecum [facility]) Staging form: Colon and Rectum, AJCC 7th Edition - Pathologic stage from 08/06/2015: Stage IIIB (T3, N1b, cM0) - Signed by Baird Cancer, PA-C on 08/20/2015  Adenocarcinoma of rectosigmoid junction [facility]) Staging form: Colon and Rectum, AJCC 7th Edition - Pathologic stage from 08/06/2015: Stage IIA (T3, N0, cM0) - Signed by Baird Cancer, PA-C on 08/20/2015  Gastric adenocarcinoma [facility]) Staging form: Stomach, AJCC 7th Edition - Pathologic stage from 08/06/2015: Stage IIIA (T2, N3a, cM0) - Signed by Baird Cancer, PA-C on 08/20/2015 Anemia Iron deficiency s/p one dose Injectafer Elevated CEA Bilateral Pulmonary nodules Tobacco Abuse  He has been iron deficient in the past and was given 1 dose of Injectafer.  Iron levels were drawn and are still pending during dictation.  Will call patient with results.  Gastric adenocarcinoma ([place]) Stage IIIA (T2N3AM0) invasive adenocarcinoma of antrum of stomach with 8/16 lymph nodes involved with metastatic disease having undergone definitive resection by Dr. Barry Dienes on 08/02/2015.  He did not completed adjuvant FOLFOX.   Adenocarcinoma of cecum ([place]) Stage IIIB (T3N1BM0) invasive adenocarcinoma of cecum, S/P resection by Dr. Barry Dienes on 08/02/2015.  Again did not complete adjuvant FOLFOX, continues to follow with GI.   Adenocarcinoma of rectosigmoid junction ([facility]) Stage IIA (T3N0M0) invasive adenocarcinoma of rectosigmoid colon, S/P surgical resection by Dr. Barry Dienes on 08/02/2015.  PLAN: I reviewed in detail patient's restaging PET scan today.  Reveals slight enlargement of several pulmonary nodules which continue to demonstrate low-grade but abnormal metabolic activity suspicious for malignancy.  A right lower lobe pulmonary nodule is currently 0.7 cm and was previously 0.3 cm.  They also  note to hepatic lobe capsular margin that could simply be artifact but merits surveillance.  There is stable very low-grade activity and mild enlarged AP window lymph node and a subtle new focus of metabolic activity in the T5 vertebral body that merits surveillance.  Spoke to patient in detail about  previously discussed plan including biopsy with systemic chemotherapy and potential radiation. Patient has opted for continued observation.  Will have patient return to clinic in 3 months for MD evaluation and labs.  Patient in agreement with plan.  He understands he can call if he develops worsening symptoms.   No orders of the defined types were placed in this encounter.  All questions were answered. The patient knows to call the clinic with any problems, questions or concerns.   This note was electronically signed.    Jacquelin Hawking, NP  03/29/2017 3:50 PM

## 2017-03-29 NOTE — Patient Instructions (Signed)
[facility] at [facility] Discharge Instructions  RECOMMENDATIONS MADE BY THE CONSULTANT AND ANY TEST RESULTS WILL BE SENT TO YOUR REFERRING PHYSICIAN.  You were seen today by Jenny Burns, NP  Thank you for choosing [facility] at [facility] to provide your oncology and hematology care.  To afford each patient quality time with our provider, please arrive at least 15 minutes before your scheduled appointment time.    If you have a lab appointment with the Cancer Center please come in thru the  Main Entrance and check in at the main information desk  You need to re-schedule your appointment should you arrive 10 or more minutes late.  We strive to give you quality time with our providers, and arriving late affects you and other patients whose appointments are after yours.  Also, if you no show three or more times for appointments you may be dismissed from the clinic at the providers discretion.     Again, thank you for choosing  Cancer Center.  Our hope is that these requests will decrease the amount of time that you wait before being seen by our physicians.       _____________________________________________________________  Should you have questions after your visit to  Cancer Center, please contact our office at [phone] between the hours of 8:30 a.m. and 4:30 p.m.  Voicemails left after 4:30 p.m. will not be returned until the following business day.  For prescription refill requests, have your pharmacy contact our office.       Resources For Cancer Patients and their Caregivers ? American Cancer Society: Can assist with transportation, wigs, general needs, runs Look Good Feel Better.        1-888-227-6333 ? Cancer Care: Provides financial assistance, online support groups, medication/co-pay assistance.  1-800-813-HOPE (4673) ? [facility] Assists [place] cancer patients and their  families through emotional , educational and financial support.  336-427-4357 ? [place] DSS Where to apply for food stamps, Medicaid and utility assistance. 336-342-1394 ? [facility]: Transportation to medical appointments. 336-347-2287 ? Social Security Administration: May apply for disability if have a Stage IV cancer. 336-342-7796 1-800-772-1213 ? [place] Aging, Disability and Transit Services: Assists with nutrition, care and transit needs. 336-349-2343  Cancer Center Support Programs: @10RELATIVEDAYS@ > Cancer Support Group  2nd Tuesday of the month 1pm-2pm, Journey Room  > Creative Journey  3rd Tuesday of the month 1130am-1pm, Journey Room  > Look Good Feel Better  1st Wednesday of the month 10am-12 noon, Journey Room (Call American Cancer Society to register 1-800-395-5775)    

## 2017-03-30 ENCOUNTER — Ambulatory Visit (HOSPITAL_COMMUNITY): Payer: Medicare HMO

## 2017-03-30 LAB — CEA: CEA: 5.6 ng/mL — ABNORMAL HIGH (ref 0.0–4.7)

## 2017-03-30 NOTE — Progress Notes (Signed)
Can we get this patient set up for IV feraheme in the next week or so. He last received IV iron last January and his iron stores are low again at 21. He may not agree to have IV iron but I think it may make him feel better. Will place orders.   Thanks,   Sonia Baller

## 2017-04-04 ENCOUNTER — Ambulatory Visit: Payer: Medicare HMO | Admitting: *Deleted

## 2017-04-05 ENCOUNTER — Inpatient Hospital Stay (HOSPITAL_COMMUNITY): Payer: Medicare HMO | Attending: Internal Medicine

## 2017-04-05 ENCOUNTER — Other Ambulatory Visit: Payer: Self-pay

## 2017-04-05 ENCOUNTER — Encounter: Payer: Self-pay | Admitting: *Deleted

## 2017-04-05 ENCOUNTER — Encounter (HOSPITAL_COMMUNITY): Payer: Self-pay

## 2017-04-05 ENCOUNTER — Ambulatory Visit (INDEPENDENT_AMBULATORY_CARE_PROVIDER_SITE_OTHER): Payer: Medicare HMO

## 2017-04-05 VITALS — BP 131/52 | HR 46 | Temp 97.8°F | Resp 18 | Wt 170.4 lb

## 2017-04-05 VITALS — BP 128/61 | HR 56 | Temp 97.4°F | Ht 71.0 in | Wt 172.0 lb

## 2017-04-05 DIAGNOSIS — D508 Other iron deficiency anemias: Secondary | ICD-10-CM | POA: Diagnosis not present

## 2017-04-05 DIAGNOSIS — R918 Other nonspecific abnormal finding of lung field: Secondary | ICD-10-CM | POA: Diagnosis present

## 2017-04-05 DIAGNOSIS — Z0000 Encounter for general adult medical examination without abnormal findings: Secondary | ICD-10-CM | POA: Diagnosis not present

## 2017-04-05 MED ORDER — SODIUM CHLORIDE 0.9 % IV SOLN
510.0000 mg | Freq: Once | INTRAVENOUS | Status: AC
  Administered 2017-04-05: 510 mg via INTRAVENOUS
  Filled 2017-04-05: qty 17

## 2017-04-05 MED ORDER — HEPARIN SOD (PORK) LOCK FLUSH 100 UNIT/ML IV SOLN
500.0000 [IU] | Freq: Once | INTRAVENOUS | Status: AC
  Administered 2017-04-05: 500 [IU] via INTRAVENOUS

## 2017-04-05 MED ORDER — SODIUM CHLORIDE 0.9 % IV SOLN
Freq: Once | INTRAVENOUS | Status: AC
  Administered 2017-04-05: 11:00:00 via INTRAVENOUS

## 2017-04-05 NOTE — Progress Notes (Signed)
Subjective:   Steven Drake is a 72 y.o. male who presents for an Initial Medicare Annual Wellness Visit.  Review of Systems  Patient is here today for his initial Medicare Annual Wellness Visit.  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  He enjoys working publicly along with working around his home, cutting hay, and staying busy.  He lives at home alone after his wife died several years ago.  He has no biological children but does have step children and grandchildren.  His pets include two outdoor dogs and a horse he has had for 27 years.     Cardiac Risk Factors include: advanced age (>52men, >16 women);dyslipidemia;hypertension;male gender;smoking/ tobacco exposure    Objective:    Today's Vitals   04/05/17 1529  BP: 128/61  Pulse: (!) 56  Temp: (!) 97.4 F (36.3 C)  TempSrc: Oral  Weight: 172 lb (78 kg)  Height: 5\' 11"  (1.803 m)   Body mass index is 23.99 kg/m.  Advanced Directives 04/05/2017 04/05/2017 03/29/2017 02/12/2017 12/27/2016 11/16/2016 10/05/2016  Does Patient Have a Medical Advance Directive? No No No No No No No  Would patient like information on creating a medical advance directive? No - Patient declined No - Patient declined No - Patient declined No - Patient declined No - Patient declined - No - Patient declined  Pre-existing out of facility DNR order (yellow form or pink MOST form) - - - - - - -    Current Medications (verified) Outpatient Encounter Medications as of 04/05/2017  Medication Sig  . acetaminophen (TYLENOL) 325 MG tablet Take 650 mg by mouth daily as needed for moderate pain or headache.  Marland Kitchen aspirin 81 MG EC tablet Take 81 mg by mouth daily.  Marland Kitchen atorvastatin (LIPITOR) 40 MG tablet TAKE ONE TABLET BY  MOUTH EVERY DAY  . clopidogrel (PLAVIX) 75 MG tablet TAKE 1 TABLET BY MOUTH DAILY WITH BREAKFAST  . diphenoxylate-atropine (LOMOTIL) 2.5-0.025 MG tablet TAKE 1 TO 2 TABLETS 4 TIMES DAILY AS NEEDED FOR DIARRHEA  . lidocaine-prilocaine (EMLA) cream APPLY A QUARTER SIZE AMOUNT TO PORT SITE 1 HOUR PRIOR TO CHEMO. DO NOT RUB IN. COVER WITH PLASTIC WRAP.  Marland Kitchen lisinopril (PRINIVIL,ZESTRIL) 5 MG tablet TAKE ONE TABLET BY MOUTH EVERY DAY  . ondansetron (ZOFRAN) 8 MG tablet Take 1 tablet (8 mg total) by mouth every 8 (eight) hours as needed for nausea or vomiting.  . pantoprazole (PROTONIX) 40 MG tablet TAKE 1 TABLET BY MOUTH EVERY DAY AT 6:00 AM  . prochlorperazine (COMPAZINE) 10 MG tablet Take 1 tablet (10 mg total) by mouth every 6 (six) hours as needed for nausea or vomiting.   No facility-administered encounter medications on file as of 04/05/2017.     Allergies (verified) Contrast media [iodinated diagnostic agents]   History: Past Medical History:  Diagnosis Date  . Adenocarcinoma of cecum ([place]) 07/01/2015  . 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 CAD (coronary artery disease) RCA non dominant vessel, LAD 40%, 80-90% 2nd diag EF 55%  01/19/2013   Dr. Claiborne Billings [place] 03-05-15 Epic.  . Family history of colon cancer   . Family history of stomach cancer   . Gastric adenocarcinoma ([place]) 06/29/2015  .  Gastric ulcer    many yrs ago  . GERD (gastroesophageal reflux disease)   . Hyperlipidemia LDL goal < 70 01/19/2013  . Hypertension   . Hypokalemia 01/20/2013  . Metabolic syndrome, with mildly elevated HgBA1C 01/20/2013  . Pulmonary nodules 01/19/2013  . STEMI (ST elevation myocardial infarction), 01/19/13 01/19/2013  . Tobacco abuse 01/20/2013  . Transfusion history    with gastric ulcer- many yrs ago   Past Surgical History:  Procedure Laterality Date  . APPENDECTOMY    . BIOPSY  10/05/2016   Procedure: BIOPSY;  Surgeon: Daneil Dolin, MD;  Location: [facility];  Service:  Endoscopy;;  sigmoid anastomosis  . CATARACT EXTRACTION, BILATERAL    . COLONOSCOPY N/A 06/10/2015   RMR: cecal neoplasm  ? biopsied. large polyp at the hepatic flexure removed with piecmeal polypectomy and APC ablation. lesion tattooed. Large polyp in the mid sigmoid status post piecemeal hot snare debulking and tattooing. this lesion not completely removed. scatterd pancolonic diverticulsosis. no speciments collected.   . ESOPHAGOGASTRODUODENOSCOPY N/A 06/10/2015   Procedure: ESOPHAGOGASTRODUODENOSCOPY (EGD);  Surgeon: Daneil Dolin, MD;  Location: [facility];  Service: Endoscopy;  Laterality: N/A;  . ESOPHAGOGASTRODUODENOSCOPY N/A 06/17/2015   RMR: normal esophagus small hiatal hernia. Abnormal nodular antrum .pyloric channel status post biopsy.   Marland Kitchen FLEXIBLE SIGMOIDOSCOPY N/A 10/05/2016   Procedure: FLEXIBLE SIGMOIDOSCOPY;  Surgeon: Daneil Dolin, MD;  Location: [facility];  Service: Endoscopy;  Laterality: N/A;  130   . HERNIA REPAIR Right   . LAPAROSCOPIC SUBTOTAL COLECTOMY N/A 08/02/2015   Procedure: LAPAROSCOPIC SUBTOTAL COLECTOMY  AND DISTAL GASTRECTOMY ;  Surgeon: Stark Klein, MD;  Location: [facility];  Service: General;  Laterality: N/A;  . LEFT HEART CATH N/A 01/19/2013   Procedure: LEFT HEART CATH;  Surgeon: Troy Sine, MD;  Location: [facility] CATH LAB;  Service: Cardiovascular;  Laterality: N/A;   Family History  Problem Relation Age of Onset  . Heart attack Mother   . Colon cancer Father        dx in his 29s  . Colon cancer Sister        dx possibly in her early 33s  . Stomach cancer Sister        dx in her 56s  . Colon cancer Paternal Uncle    Social History   Socioeconomic History  . Marital status: Widowed    Spouse name: None  . Number of children: 0  . Years of education: None  . Highest education level: None  Social Needs  . Financial resource strain: None  . Food insecurity - worry: None  . Food insecurity - inability: None  . Transportation needs - medical:  None  . Transportation needs - non-medical: None  Occupational History  . Occupation: farmer  Tobacco Use  . Smoking status: Current Every Day Smoker    Packs/day: 1.00    Years: 58.00    Pack years: 58.00    Types: Cigarettes    Start date: 01/21/1963  . Smokeless tobacco: Never Used  . Tobacco comment: one pack daily  Substance and Sexual Activity  . Alcohol use: No    Alcohol/week: 0.0 oz    Comment: none in 15 yrs -heavy user-none now.  . Drug use: No  . Sexual activity: None  Other Topics Concern  . None  Social History Narrative  . None   Tobacco Counseling  Ready to quit: No Counseling given: Not Answered Comment: one pack daily  Patient reports  he has smoked "all his life" and has no desire to quit.    Clinical Intake:                 Activities of Daily Living  In your present state of health, do you have any difficulty performing the following activities: 04/05/2017  Hearing? N  Vision? N  Difficulty concentrating or making decisions? N  Walking or climbing stairs? N  Dressing or bathing? N  Doing errands, shopping? N  Preparing Food and eating ? N  Using the Toilet? N  In the past six months, have you accidently leaked urine? N  Do you have problems with loss of bowel control? N  Managing your Medications? N  Managing your Finances? N  Housekeeping or managing your Housekeeping? N  Some recent data might be hidden   Patient reports no problems other than some mild cloudiness in his vision.   Immunizations and Health Maintenance There is no immunization history for the selected administration types on file for this patient.   Patient refuses vaccination for Influenza, Pneumonia, Shingles, or Tetanus.  Health Maintenance Due  Topic Date Due  . Hepatitis C Screening  Jan 04, 1946    Patient Care Team: Stephen Baruch, Fransisca Kaufmann, MD as PCP - General (Family Medicine) Gala Romney, Cristopher Estimable, MD as Consulting Physician (Gastroenterology) Carlis Stable, NP  as Nurse Practitioner (Gastroenterology) Troy Sine, MD as Consulting Physician (Cardiology) Twana First, MD as Consulting Physician (Oncology)  Indicate any recent Medical Services you may have received from other than [facility] providers in the past year (date may be approximate).    Assessment:   This is a routine wellness examination for Steven Drake.  Hearing/Vision screen No exam data present  Dietary issues and exercise activities discussed: Current Exercise Habits: The patient has a physically strenous job, but has no regular exercise apart from work., Exercise limited by: None identified  Goals    . DIET - EAT MORE FRUITS AND VEGETABLES    . Reduce caffeine intake      Depression Screen PHQ 2/9 Scores 04/05/2017 06/13/2016 05/22/2016 07/19/2015  PHQ - 2 Score 0 0 0 0    Fall Risk Fall Risk  04/05/2017 06/13/2016 05/22/2016 09/29/2015 06/01/2015  Falls in the past year? No No No No No    Is the patient's home free of loose throw rugs in walkways, pet beds, electrical cords, etc?   Yes      Grab bars in the bathroom? no      Handrails on the stairs?   No stairs      Adequate lighting?   Yes  Timed Get Up and Go performed:  Cognitive Function: MMSE - Mini Mental State Exam 04/05/2017  Orientation to time 5  Orientation to Place 5  Registration 3  Attention/ Calculation 5  Recall 3  Language- name 2 objects 2  Language- repeat 1  Language- follow 3 step command 3  Language- read & follow direction 1  Write a sentence 1  Copy design 1  Total score 30      Patient did well, scoring 30 out of 30 available points.  Screening Tests Health Maintenance  Topic Date Due  . Hepatitis C Screening  [birth year]  . INFLUENZA VACCINE  12/27/2017 (Originally 09/27/2016)  . TETANUS/TDAP  04/05/2018 (Originally 06/29/1962)  . PNA vac Low Risk Adult (1 of 2 - PCV13) 04/05/2018 (Originally 06/28/2008)  . COLONOSCOPY  06/09/2025   Qualifies for Shingles Vaccine? Patient refuses  Cancer  Screenings: Lung: Low Dose CT Chest recommended if Age 91-80 years, 30 pack-year currently smoking OR have quit w/in 15years. Patient does not qualify. Colorectal: Known colon cancer  Additional Screenings:  Hepatitis B/HIV/Syphillis: Hepatitis C Screening:       Plan:   Follow up with PCP on 04/27/17 for labwork, medication refills, review of chronic medical conditions.  I have personally reviewed and noted the following in the patient's chart:   . Medical and social history . Use of alcohol, tobacco or illicit drugs  . Current medications and supplements . Functional ability and status . Nutritional status . Physical activity . Advanced directives . List of other physicians . Hospitalizations, surgeries, and ER visits in previous 12 months . Vitals . Screenings to include cognitive, depression, and falls . Referrals and appointments  In addition, I have reviewed and discussed with patient certain preventive protocols, quality metrics, and best practice recommendations. A written personalized care plan for preventive services as well as general preventive health recommendations were provided to patient.      I have reviewed and agree with the above AWV documentation.   Caryl Pina, MD [facility] 04/05/2017, 4:06 PM

## 2017-04-05 NOTE — Patient Instructions (Signed)
  Mr. Bellanca , Thank you for taking time to come for your Medicare Wellness Visit. I appreciate your ongoing commitment to your health goals. Please review the following plan we discussed and let me know if I can assist you in the future.   These are the goals we discussed: Goals    . DIET - EAT MORE FRUITS AND VEGETABLES    . Reduce caffeine intake       This is a list of the screening recommended for you and due dates:  Health Maintenance  Topic Date Due  .  Hepatitis C: One time screening is recommended by Center for Disease Control  (CDC) for  adults born from [birth year] through 1967.   1946-01-05  . Flu Shot  12/27/2017*  . Tetanus Vaccine  04/05/2018*  . Pneumonia vaccines (1 of 2 - PCV13) 04/05/2018*  . Colon Cancer Screening  06/09/2025  *Topic was postponed. The date shown is not the original due date.

## 2017-04-05 NOTE — Progress Notes (Signed)
Tolerated infusion w/o adverse reaction.  Alert, in no distress.  VSS.  Discharged ambulatory.  

## 2017-04-23 ENCOUNTER — Other Ambulatory Visit: Payer: Self-pay | Admitting: Cardiovascular Disease

## 2017-04-23 NOTE — Telephone Encounter (Signed)
REFILL 

## 2017-04-27 ENCOUNTER — Ambulatory Visit (INDEPENDENT_AMBULATORY_CARE_PROVIDER_SITE_OTHER): Payer: Medicare HMO | Admitting: Family Medicine

## 2017-04-27 ENCOUNTER — Encounter: Payer: Self-pay | Admitting: Family Medicine

## 2017-04-27 VITALS — BP 135/69 | HR 65 | Temp 98.1°F | Ht 71.0 in | Wt 171.0 lb

## 2017-04-27 DIAGNOSIS — I10 Essential (primary) hypertension: Secondary | ICD-10-CM | POA: Diagnosis not present

## 2017-04-27 DIAGNOSIS — Z720 Tobacco use: Secondary | ICD-10-CM | POA: Diagnosis not present

## 2017-04-27 DIAGNOSIS — E785 Hyperlipidemia, unspecified: Secondary | ICD-10-CM | POA: Diagnosis not present

## 2017-04-27 NOTE — Progress Notes (Signed)
BP 135/69   Pulse 65   Temp 98.1 F (36.7 C) (Oral)   Ht 5\' 11"  (1.803 m)   Wt 171 lb (77.6 kg)   BMI 23.85 kg/m    Subjective:    [name], male    DOB: May 03, 1945, 72 y.o.   MRN: [id number]  HPI: LYNETTE NOAH is a 72 y.o. male presenting on 04/27/2017 for Hyperlipidemia   HPI Hyperlipidemia Patient is coming in for recheck of his hyperlipidemia. The patient is currently taking Lipitor. They deny any issues with myalgias or history of liver damage from it. They deny any focal numbness or weakness or chest pain.   Hypertension Patient is currently on lisinopril, and their blood pressure today is 135/69. Patient denies any lightheadedness or dizziness. Patient denies headaches, blurred vision, chest pains, shortness of breath, or weakness. Denies any side effects from medication and is content with current medication.   Smoking, patient has no intentions of ever quitting smoking.  Patient continues to see an oncologist for gastric cancer but has been for now given and has been given a Scan and says that they think it is clear.  Relevant past medical, surgical, family and social history reviewed and updated as indicated. Interim medical history since our last visit reviewed. Allergies and medications reviewed and updated.  Review of Systems  Constitutional: Negative for chills and fever.  HENT: Negative for congestion.   Eyes: Negative for discharge.  Respiratory: Positive for cough and wheezing. Negative for shortness of breath.   Cardiovascular: Negative for chest pain and leg swelling.  Musculoskeletal: Negative for back pain and gait problem.  Skin: Negative for rash.  All other systems reviewed and are negative.   Per HPI unless specifically indicated above   Allergies as of 04/27/2017      Reactions   Contrast Media [iodinated Diagnostic Agents] Itching, Rash      Medication List        Accurate as of 04/27/17  3:30 PM. Always use your most  recent med list.          acetaminophen 325 MG tablet Commonly known as:  TYLENOL Take 650 mg by mouth daily as needed for moderate pain or headache.   aspirin 81 MG EC tablet Take 81 mg by mouth daily.   atorvastatin 40 MG tablet Commonly known as:  LIPITOR TAKE ONE TABLET BY MOUTH EVERY DAY   clopidogrel 75 MG tablet Commonly known as:  PLAVIX TAKE 1 TABLET BY MOUTH DAILY WITH BREAKFAST   diphenoxylate-atropine 2.5-0.025 MG tablet Commonly known as:  LOMOTIL TAKE 1 TO 2 TABLETS 4 TIMES DAILY AS NEEDED FOR DIARRHEA   lidocaine-prilocaine cream Commonly known as:  EMLA APPLY A QUARTER SIZE AMOUNT TO PORT SITE 1 HOUR PRIOR TO CHEMO. DO NOT RUB IN. COVER WITH PLASTIC WRAP.   lisinopril 5 MG tablet Commonly known as:  PRINIVIL,ZESTRIL TAKE ONE TABLET BY MOUTH EVERY DAY   ondansetron 8 MG tablet Commonly known as:  ZOFRAN Take 1 tablet (8 mg total) by mouth every 8 (eight) hours as needed for nausea or vomiting.   pantoprazole 40 MG tablet Commonly known as:  PROTONIX TAKE 1 TABLET BY MOUTH EVERY DAY AT 6:00 AM   prochlorperazine 10 MG tablet Commonly known as:  COMPAZINE Take 1 tablet (10 mg total) by mouth every 6 (six) hours as needed for nausea or vomiting.          Objective:    BP 135/69  Pulse 65   Temp 98.1 F (36.7 C) (Oral)   Ht 5\' 11"  (1.803 m)   Wt 171 lb (77.6 kg)   BMI 23.85 kg/m   Wt Readings from Last 3 Encounters:  04/27/17 171 lb (77.6 kg)  04/05/17 172 lb (78 kg)  04/05/17 170 lb 6.4 oz (77.3 kg)    Physical Exam  Constitutional: He is oriented to person, place, and time. He appears well-developed and well-nourished. No distress.  Eyes: Conjunctivae are normal. No scleral icterus.  Neck: Neck supple. No thyromegaly present.  Cardiovascular: Normal rate, regular rhythm, normal heart sounds and intact distal pulses.  No murmur heard. Pulmonary/Chest: Effort normal. No respiratory distress. He has wheezes (Upper end expiratory  wheezes). He has no rales.  Musculoskeletal: Normal range of motion. He exhibits no edema.  Lymphadenopathy:    He has no cervical adenopathy.  Neurological: He is alert and oriented to person, place, and time. Coordination normal.  Skin: Skin is warm and dry. No rash noted. He is not diaphoretic.  Psychiatric: He has a normal mood and affect. His behavior is normal.  Nursing note and vitals reviewed.       Assessment & Plan:   Problem List Items Addressed This Visit      Cardiovascular and Mediastinum   Hypertension     Other   Tobacco abuse - Primary (Chronic)   Hyperlipidemia with target LDL less than 70       Follow up plan: Return if symptoms worsen or fail to improve.  Counseling provided for all of the vaccine components No orders of the defined types were placed in this encounter.   Caryl Pina, MD [facility] 04/27/2017, 3:30 PM

## 2017-05-24 ENCOUNTER — Encounter (HOSPITAL_COMMUNITY): Payer: Medicare HMO

## 2017-05-24 ENCOUNTER — Other Ambulatory Visit: Payer: Self-pay | Admitting: Family Medicine

## 2017-05-24 NOTE — Progress Notes (Deleted)
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    CURRENT THERAPY: ***  BRIEF ONCOLOGIC HISTORY:    Gastric adenocarcinoma ([place])   06/17/2015 Procedure    Stomach biopsy of pyloric lesion, Dr. 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      06/28/2015 PET scan    Intensely hypermetabolic cecal mass. No definitive evidence of metastatic disease. Scattered upper lobe predominant pulmonary nodules are too small for PET resolution.       08/02/2015 Procedure    Antral resection of tumor of stomach by Dr. Stark Klein      08/06/2015 Pathology Results    Invasive adenocarcinoma, poorly differentiated, 3.0 cm, extending into the muscularis propria, LVI +, 8/16 positive lymph nodes for metastatic disease, negative margins.      08/06/2015 Cancer Staging    pT2N3A      09/15/2015 Procedure    Port placed by IR      09/20/2015 - 10/18/2015 Chemotherapy    FOLFOX x 2 cycles      09/29/2015 Treatment Plan Change    5 FU bolus is discontinued      10/07/2015 Survivorship    Genetic Counseling consultation with [name]- Despite our recommendation, Mr. Wahlen did not wish to pursue genetic testing at today's visit. We understand this decision, and remain available to coordinate genetic testing at any time in the future. We; therefore, recommend Mr. Neville continue to follow the cancer screening guidelines given by his primary healthcare provider.      10/18/2015 Treatment Plan Change    Patient refuses future  chemotherapy treatment      09/15/2016 Imaging    CT chest w/o contrast: IMPRESSION: 1. While several of the previously noted new pulmonary nodules have resolved compared to the prior study, indicative of benign nodules, there are 2 enlarging nodules. One of these in the left lower lobe has increased from 5 mm to 8 mm in size. The largest nodule (visible in retrospect on the prior study) has also increased in size and currently measures 10 x 8 x 13 mm in the medial aspect of the right middle lobe (axial image 96 of series 4). These findings are suspicious for metastatic disease, although either of these lesions could alternatively represent a primary bronchogenic malignancy given the background of smoking related changes in the lungs. Further evaluation with PET-CT is recommended at this time. 2. Mild diffuse bronchial thickening with mild to moderate centrilobular and paraseptal emphysema; imaging findings suggestive of underlying COPD. 3. Aortic atherosclerosis, in addition to left main and 3 vessel coronary artery disease. Assessment for potential risk factor modification, dietary therapy or pharmacologic therapy may be warranted, if clinically indicated. 4. Additional incidental findings, as above.       Adenocarcinoma of cecum ([place])   06/10/2015 Procedure    Colonoscopy with Dr. Gala Romney, Cecal neoplasm. large polyp at hepatic flexure. lesion tattooed, large polyp in mid sigmoid (lesion not completely removed)      06/10/2015 Pathology Results    Cecal mass - adenocarcinoma, polyp at hepatic  flexure foci of adenocarcinoma arising from tubulovillous adenoma      06/28/2015 PET scan    Intensely hypermetabolic cecal mass. No definitive evidence of metastatic disease. Scattered upper lobe predominant pulmonary nodules are too small for PET resolution.       08/02/2015 Procedure    Segmental resection of tumor, right, transverse, descending colon by Dr. Stark Klein      08/06/2015  Pathology Results    Invasive adenocarcinoma, 4.3 cm, well-differentiated, extending through the muscularis propria into the pericolonic soft tissue. 2/14 lymph nodes positive for metastatic disease. Surgical margins are negative.      08/06/2015 Cancer Staging    pT3N1B       Adenocarcinoma of rectosigmoid junction ([place])   06/10/2015 Procedure    Colonoscopy with Dr. Gala Romney, Cecal neoplasm. large polyp at hepatic flexure. lesion tattooed, large polyp in mid sigmoid (lesion not completely removed)      06/10/2015 Pathology Results    Polyp sigmoid - tubulovillous adenoma with at least high grade dysplasia cannot rule out focal adenocarcinoma      06/28/2015 PET scan    Intensely hypermetabolic cecal mass. No definitive evidence of metastatic disease. Scattered upper lobe predominant pulmonary nodules are too small for PET resolution.       08/02/2015 Procedure    Segmental resection for tumor of rectosigmoid colon by Dr. Stark Klein.      08/06/2015 Pathology Results    Invasive adenocarcinoma, 2.6 cm, well-differentiated, extending through the muscularis propria and into pericolonic soft tissue, 0/10 nodes involved, negative resection margins. No LVI.      08/06/2015 Cancer Staging    pT3N0        HISTORY OF PRESENT ILLNESS:     INTERVAL HISTORY:    REVIEW OF SYSTEMS:  Review of Systems - Oncology   PAST MEDICAL/SURGICAL HISTORY:  Past Medical History:  Diagnosis Date  . Adenocarcinoma of cecum ([place]) 07/01/2015  . Adenocarcinoma of rectosigmoid junction ([place]) 08/02/2015  . CAD (coronary artery disease) RCA non dominant vessel, LAD 40%, 80-90% 2nd diag EF 55%  01/19/2013   Dr. Claiborne Billings [place] 03-05-15 Epic.  . Family history of colon cancer   . Family history of stomach cancer   . Gastric adenocarcinoma ([place]) 06/29/2015  . Gastric ulcer    many yrs ago  . GERD (gastroesophageal reflux disease)   . Hyperlipidemia LDL goal < 70 01/19/2013  . Hypertension   . Hypokalemia 01/20/2013    . Metabolic syndrome, with mildly elevated HgBA1C 01/20/2013  . Pulmonary nodules 01/19/2013  . STEMI (ST elevation myocardial infarction), 01/19/13 01/19/2013  . Tobacco abuse 01/20/2013  . Transfusion history    with gastric ulcer- many yrs ago   Past Surgical History:  Procedure Laterality Date  . APPENDECTOMY    . BIOPSY  10/05/2016   Procedure: BIOPSY;  Surgeon: Daneil Dolin, MD;  Location: [facility];  Service: Endoscopy;;  sigmoid anastomosis  . CATARACT EXTRACTION, BILATERAL    . COLONOSCOPY N/A 06/10/2015   RMR: cecal neoplasm  ? biopsied. large polyp at the hepatic flexure removed with piecmeal polypectomy and APC ablation. lesion tattooed. Large polyp in the mid sigmoid status post piecemeal hot snare debulking and tattooing. this lesion not completely removed. scatterd pancolonic diverticulsosis. no speciments collected.   . ESOPHAGOGASTRODUODENOSCOPY N/A 06/10/2015   Procedure: ESOPHAGOGASTRODUODENOSCOPY (EGD);  Surgeon: Daneil Dolin, MD;  Location: [facility];  Service: Endoscopy;  Laterality: N/A;  . ESOPHAGOGASTRODUODENOSCOPY N/A 06/17/2015   RMR: normal esophagus  small hiatal hernia. Abnormal nodular antrum .pyloric channel status post biopsy.   Marland Kitchen FLEXIBLE SIGMOIDOSCOPY N/A 10/05/2016   Procedure: FLEXIBLE SIGMOIDOSCOPY;  Surgeon: Daneil Dolin, MD;  Location: [facility];  Service: Endoscopy;  Laterality: N/A;  130   . HERNIA REPAIR Right   . LAPAROSCOPIC SUBTOTAL COLECTOMY N/A 08/02/2015   Procedure: LAPAROSCOPIC SUBTOTAL COLECTOMY  AND DISTAL GASTRECTOMY ;  Surgeon: Stark Klein, MD;  Location: [facility] ORS;  Service: General;  Laterality: N/A;  . LEFT HEART CATH N/A 01/19/2013   Procedure: LEFT HEART CATH;  Surgeon: Troy Sine, MD;  [facility] LAB;  Service: Cardiovascular;  Laterality: N/A;     SOCIAL HISTORY:  Social History   Socioeconomic History  . Marital status: Widowed    Spouse name: Not on file  . Number of children: 0  . Years of  education: Not on file  . Highest education level: Not on file  Occupational History  . Occupation: farmer  Social Needs  . Financial resource strain: Not on file  . Food insecurity:    Worry: Not on file    Inability: Not on file  . Transportation needs:    Medical: Not on file    Non-medical: Not on file  Tobacco Use  . Smoking status: Current Every Day Smoker    Packs/day: 1.00    Years: 58.00    Pack years: 58.00    Types: Cigarettes    Start date: 01/21/1963  . Smokeless tobacco: Never Used  . Tobacco comment: one pack daily  Substance and Sexual Activity  . Alcohol use: No    Alcohol/week: 0.0 oz    Comment: none in 15 yrs -heavy user-none now.  . Drug use: No  . Sexual activity: Not on file  Lifestyle  . Physical activity:    Days per week: Not on file    Minutes per session: Not on file  . Stress: Not on file  Relationships  . Social connections:    Talks on phone: Not on file    Gets together: Not on file    Attends religious service: Not on file    Active member of club or organization: Not on file    Attends meetings of clubs or organizations: Not on file    Relationship status: Not on file  . Intimate partner violence:    Fear of current or ex partner: Not on file    Emotionally abused: Not on file    Physically abused: Not on file    Forced sexual activity: Not on file  Other Topics Concern  . Not on file  Social History Narrative  . Not on file    FAMILY HISTORY:  Family History  Problem Relation Age of Onset  . Heart attack Mother   . Colon cancer Father        dx in his 65s  . Colon cancer Sister        dx possibly in her early 37s  . Stomach cancer Sister        dx in her 28s  . Colon cancer Paternal Uncle     CURRENT MEDICATIONS:  Outpatient Encounter Medications as of 05/25/2017  Medication Sig  . acetaminophen (TYLENOL) 325 MG tablet Take 650 mg by mouth daily as needed for moderate pain or headache.  Marland Kitchen aspirin 81 MG EC tablet  Take 81 mg by mouth daily.  Marland Kitchen atorvastatin (LIPITOR) 40 MG tablet TAKE ONE TABLET BY MOUTH EVERY DAY  .  clopidogrel (PLAVIX) 75 MG tablet TAKE 1 TABLET BY MOUTH DAILY WITH BREAKFAST  . diphenoxylate-atropine (LOMOTIL) 2.5-0.025 MG tablet TAKE 1 TO 2 TABLETS 4 TIMES DAILY AS NEEDED FOR DIARRHEA  . lidocaine-prilocaine (EMLA) cream APPLY A QUARTER SIZE AMOUNT TO PORT SITE 1 HOUR PRIOR TO CHEMO. DO NOT RUB IN. COVER WITH PLASTIC WRAP.  Marland Kitchen lisinopril (PRINIVIL,ZESTRIL) 5 MG tablet TAKE ONE TABLET BY MOUTH EVERY DAY  . ondansetron (ZOFRAN) 8 MG tablet Take 1 tablet (8 mg total) by mouth every 8 (eight) hours as needed for nausea or vomiting.  . pantoprazole (PROTONIX) 40 MG tablet TAKE 1 TABLET BY MOUTH EVERY DAY AT 6:00 AM  . prochlorperazine (COMPAZINE) 10 MG tablet Take 1 tablet (10 mg total) by mouth every 6 (six) hours as needed for nausea or vomiting.   No facility-administered encounter medications on file as of 05/25/2017.     ALLERGIES:  Allergies  Allergen Reactions  . Contrast Media [Iodinated Diagnostic Agents] Itching and Rash     PHYSICAL EXAM:  ECOG Performance status: ***  There were no vitals filed for this visit. There were no vitals filed for this visit.  Physical Exam   LABORATORY DATA:  I have reviewed the labs as listed.  CBC    Component Value Date/Time   WBC 9.1 03/29/2017 1445   RBC 4.25 03/29/2017 1445   HGB 12.5 (L) 03/29/2017 1445   HGB 13.0 01/05/2017 0842   HCT 39.2 03/29/2017 1445   HCT 40.5 01/05/2017 0842   PLT 192 03/29/2017 1445   PLT 233 01/05/2017 0842   MCV 92.2 03/29/2017 1445   MCV 91 01/05/2017 0842   MCH 29.4 03/29/2017 1445   MCHC 31.9 03/29/2017 1445   RDW 14.2 03/29/2017 1445   RDW 14.1 01/05/2017 0842   LYMPHSABS 2.0 03/29/2017 1445   LYMPHSABS 2.0 05/04/2015 1410   MONOABS 0.8 03/29/2017 1445   EOSABS 0.5 03/29/2017 1445   EOSABS 0.5 (H) 05/04/2015 1410   BASOSABS 0.0 03/29/2017 1445   BASOSABS 0.0 05/04/2015 1410   CMP  Latest Ref Rng & Units 03/29/2017 01/05/2017 09/21/2016  Glucose 65 - 99 mg/dL 84 104(H) 122(H)  BUN 6 - 20 mg/dL 14 11 11   Creatinine 0.61 - 1.24 mg/dL 1.10 1.02 1.08  Sodium 135 - 145 mmol/L 140 141 137  Potassium 3.5 - 5.1 mmol/L 4.1 4.9 3.9  Chloride 101 - 111 mmol/L 107 105 106  CO2 22 - 32 mmol/L 23 24 25   Calcium 8.9 - 10.3 mg/dL 8.8(L) 9.1 8.8(L)  Total Protein 6.5 - 8.1 g/dL 6.7 6.4 6.7  Total Bilirubin 0.3 - 1.2 mg/dL 0.5 0.3 0.6  Alkaline Phos 38 - 126 U/L 120 142(H) 110  AST 15 - 41 U/L 15 15 14(L)  ALT 17 - 63 U/L 11(L) 7 10(L)    PENDING LABS:    DIAGNOSTIC IMAGING:  *The following radiologic images and reports have been reviewed independently and agree with below findings.  ***  PATHOLOGY:  ***   ASSESSMENT & PLAN:         Dispo:  -   All questions were answered to patient's stated satisfaction. Encouraged patient to call with any new concerns or questions before his next visit to the cancer center and we can certain see him sooner, if needed.    Plan of care discussed with Dr. ***, who agrees with the above aforementioned.    Orders placed this encounter:  No orders of the defined types were placed in this  encounter.     Mike Craze, NP [place] [phone]

## 2017-05-25 ENCOUNTER — Inpatient Hospital Stay (HOSPITAL_BASED_OUTPATIENT_CLINIC_OR_DEPARTMENT_OTHER): Payer: Medicare HMO | Admitting: Hematology

## 2017-05-25 ENCOUNTER — Other Ambulatory Visit: Payer: Self-pay

## 2017-05-25 ENCOUNTER — Inpatient Hospital Stay (HOSPITAL_COMMUNITY): Payer: Medicare HMO | Attending: Internal Medicine

## 2017-05-25 ENCOUNTER — Encounter (HOSPITAL_COMMUNITY): Payer: Self-pay | Admitting: Hematology

## 2017-05-25 VITALS — BP 141/67 | HR 61 | Temp 98.7°F | Resp 16

## 2017-05-25 DIAGNOSIS — G629 Polyneuropathy, unspecified: Secondary | ICD-10-CM

## 2017-05-25 DIAGNOSIS — Z79899 Other long term (current) drug therapy: Secondary | ICD-10-CM

## 2017-05-25 DIAGNOSIS — Z7982 Long term (current) use of aspirin: Secondary | ICD-10-CM | POA: Diagnosis not present

## 2017-05-25 DIAGNOSIS — C169 Malignant neoplasm of stomach, unspecified: Secondary | ICD-10-CM | POA: Diagnosis not present

## 2017-05-25 DIAGNOSIS — C19 Malignant neoplasm of rectosigmoid junction: Secondary | ICD-10-CM | POA: Diagnosis not present

## 2017-05-25 DIAGNOSIS — R197 Diarrhea, unspecified: Secondary | ICD-10-CM | POA: Diagnosis not present

## 2017-05-25 DIAGNOSIS — C180 Malignant neoplasm of cecum: Secondary | ICD-10-CM | POA: Insufficient documentation

## 2017-05-25 DIAGNOSIS — Z9221 Personal history of antineoplastic chemotherapy: Secondary | ICD-10-CM | POA: Insufficient documentation

## 2017-05-25 MED ORDER — SODIUM CHLORIDE 0.9% FLUSH
10.0000 mL | Freq: Once | INTRAVENOUS | Status: AC
  Administered 2017-05-25: 10 mL

## 2017-05-25 MED ORDER — HEPARIN SOD (PORK) LOCK FLUSH 100 UNIT/ML IV SOLN
500.0000 [IU] | Freq: Once | INTRAVENOUS | Status: AC
  Administered 2017-05-25: 500 [IU] via INTRAVENOUS

## 2017-05-25 NOTE — Assessment & Plan Note (Signed)
1.  Stage III (T2N3A) gastric adenocarcinoma: This was resected in April 2017.  Patient did receive 2 cycles of adjuvant FOLFOX, discontinued it after left foot drop.  He is under surveillance at this time.  I have discussed the findings on the PET/CT scan which showed few millimeter increase in bilateral lung nodules.  At this time, he is reluctant to consider any treatments even metastatic disease is confirmed.  2.  Stage IIIb (T3N1B) cecal adenocarcinoma: Patient refused adjuvant chemotherapy after 2 cycles of FOLFOX secondary to neuropathy and left foot drop.  His CEA level was 5.6.  We will repeat a CEA level at next visit in 4 months.  We will also help PET scan repeated.  3.  History of iron deficiency state: He was told to take 1 iron tablet daily.  4.  Peripheral neuropathy: He does not report any tingling or numbness.  But his feet feel hot at times mostly at nights.  He is able to do all activities.  5.  Stage II rectosigmoid cancer: There is slight uptake at the anastomosis on PET scan without CT correlate.  We will keep a close eye on it.  His bowel habits have not changed.  He has slight diarrhea since surgery.

## 2017-05-25 NOTE — Progress Notes (Unsigned)
Steven Drake presented for Portacath access and flush. Portacath located right chest wall accessed with  H 20 needle. Good blood return present. Portacath flushed with 47ml NS and 500U/4ml Heparin and needle removed intact. Procedure without incident. Patient tolerated procedure well.

## 2017-05-25 NOTE — Patient Instructions (Signed)
[facility] at [facility] Discharge Instructions  Port flush done today. Follow up as scheduled.   Thank you for choosing [facility] at [facility] to provide your oncology and hematology care.  To afford each patient quality time with our provider, please arrive at least 15 minutes before your scheduled appointment time.   If you have a lab appointment with the Cancer Center please come in thru the  Main Entrance and check in at the main information desk  You need to re-schedule your appointment should you arrive 10 or more minutes late.  We strive to give you quality time with our providers, and arriving late affects you and other patients whose appointments are after yours.  Also, if you no show three or more times for appointments you may be dismissed from the clinic at the providers discretion.     Again, thank you for choosing [facility].  Our hope is that these requests will decrease the amount of time that you wait before being seen by our physicians.       _____________________________________________________________  Should you have questions after your visit to [facility], please contact our office at [phone] between the hours of 8:30 a.m. and 4:30 p.m.  Voicemails left after 4:30 p.m. will not be returned until the following business day.  For prescription refill requests, have your pharmacy contact our office.       Resources For Cancer Patients and their Caregivers ? American Cancer Society: Can assist with transportation, wigs, general needs, runs Look Good Feel Better.        1-888-227-6333 ? Cancer Care: Provides financial assistance, online support groups, medication/co-pay assistance.  1-800-813-HOPE (4673) ? [facility] Assists [place] cancer patients and their families through emotional , educational and financial support.  336-427-4357 ? [place]  DSS Where to apply for food stamps, Medicaid and utility assistance. 336-342-1394 ? [facility]: Transportation to medical appointments. 336-347-2287 ? Social Security Administration: May apply for disability if have a Stage IV cancer. 336-342-7796 1-800-772-1213 ? [place] Aging, Disability and Transit Services: Assists with nutrition, care and transit needs. 336-349-2343  Cancer Center Support Programs:   > Cancer Support Group  2nd Tuesday of the month 1pm-2pm, Journey Room   > Creative Journey  3rd Tuesday of the month 1130am-1pm, Journey Room    

## 2017-05-25 NOTE — Progress Notes (Signed)
Patient Care Team: Dettinger, Fransisca Kaufmann, MD as PCP - General (Family Medicine) Gala Romney Cristopher Estimable, MD as Consulting Physician (Gastroenterology) Carlis Stable, NP as Nurse Practitioner (Gastroenterology) Troy Sine, MD as Consulting Physician (Cardiology) Twana First, MD as Consulting Physician (Oncology)  DIAGNOSIS:  Encounter Diagnoses  Name Primary?  . Gastric adenocarcinoma ([place]) Yes  . Adenocarcinoma of cecum ([place])   . 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      06/28/2015 PET scan    Intensely hypermetabolic cecal mass. No definitive evidence of metastatic disease. Scattered upper lobe predominant pulmonary nodules are too small for PET resolution.       08/02/2015 Procedure    Antral resection of tumor of stomach by Dr. Stark Klein      08/06/2015 Pathology Results    Invasive adenocarcinoma, poorly differentiated, 3.0 cm, extending into the muscularis propria, LVI +, 8/16 positive lymph nodes for metastatic disease, negative margins.      08/06/2015 Cancer Staging    pT2N3A      09/15/2015 Procedure    Port placed by IR      09/20/2015 - 10/18/2015 Chemotherapy    FOLFOX x 2 cycles      09/29/2015 Treatment Plan Change    5 FU bolus is discontinued      10/07/2015 Survivorship    Genetic Counseling consultation with [name]- Despite our recommendation, Mr. Abboud did not wish to pursue genetic testing at today's visit. We understand this decision, and remain available to coordinate genetic testing at any time in the future. We; therefore, recommend Mr. Peavy continue to follow the cancer screening guidelines given by his primary  healthcare provider.      10/18/2015 Treatment Plan Change    Patient refuses future chemotherapy treatment      09/15/2016 Imaging    CT chest w/o contrast: IMPRESSION: 1. While several of the previously noted new pulmonary nodules have resolved compared to the prior study, indicative of benign nodules, there are 2 enlarging nodules. One of these in the left lower lobe has increased from 5 mm to 8 mm in size. The largest nodule (visible in retrospect on the prior study) has also increased in size and currently measures 10 x 8 x 13 mm in the medial aspect of the right middle lobe (axial image 96 of series 4). These findings are suspicious for metastatic disease, although either of these lesions could alternatively represent a primary bronchogenic malignancy given the background of smoking related changes in the lungs. Further evaluation with PET-CT is recommended at this time. 2. Mild diffuse bronchial thickening with mild to moderate centrilobular and paraseptal emphysema; imaging findings suggestive of underlying COPD. 3. Aortic atherosclerosis, in addition to left main and 3 vessel coronary artery disease. Assessment for potential risk factor modification, dietary therapy or pharmacologic therapy may be warranted, if clinically indicated. 4. Additional incidental findings, as above.       Adenocarcinoma of cecum ([place])   06/10/2015 Procedure    Colonoscopy with Dr. Gala Romney, Cecal neoplasm. large polyp at hepatic flexure. lesion tattooed, large polyp in mid sigmoid (lesion not completely removed)      06/10/2015 Pathology Results  Cecal mass - adenocarcinoma, polyp at hepatic flexure foci of adenocarcinoma arising from tubulovillous adenoma      06/28/2015 PET scan    Intensely hypermetabolic cecal mass. No definitive evidence of metastatic disease. Scattered upper lobe predominant pulmonary nodules are too small for PET resolution.       08/02/2015 Procedure    Segmental  resection of tumor, right, transverse, descending colon by Dr. Stark Klein      08/06/2015 Pathology Results    Invasive adenocarcinoma, 4.3 cm, well-differentiated, extending through the muscularis propria into the pericolonic soft tissue. 2/14 lymph nodes positive for metastatic disease. Surgical margins are negative.      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 Gala Romney, Cecal neoplasm. large polyp at hepatic flexure. lesion tattooed, large polyp in mid sigmoid (lesion not completely removed)      06/10/2015 Pathology Results    Polyp sigmoid - tubulovillous adenoma with at least high grade dysplasia cannot rule out focal adenocarcinoma      06/28/2015 PET scan    Intensely hypermetabolic cecal mass. No definitive evidence of metastatic disease. Scattered upper lobe predominant pulmonary nodules are too small for PET resolution.       08/02/2015 Procedure    Segmental resection for tumor of rectosigmoid colon by Dr. Stark Klein.      08/06/2015 Pathology Results    Invasive adenocarcinoma, 2.6 cm, well-differentiated, extending through the muscularis propria and into pericolonic soft tissue, 0/10 nodes involved, negative resection margins. No LVI.      08/06/2015 Cancer Staging    pT3N0       CHIEF COMPLIANT: Follow-up of his cancers.  INTERVAL HISTORY: Steven Drake is a 72 year old white male who is seen in follow-up for his malignancies.  He denies any change in bowel habits.  He denies any new onset pains.  He is able to do all his activities and lives independently.  He denies any rectal bleeding or melena.  No abdominal pains were reported.  REVIEW OF SYSTEMS:   Constitutional: Denies fevers, chills or abnormal weight loss Eyes: Denies blurriness of vision Ears, nose, mouth, throat, and face: Denies mucositis or sore throat Respiratory: Denies cough, dyspnea or  wheezes Cardiovascular: Denies palpitation, chest discomfort Gastrointestinal:  Denies nausea, heartburn or change in bowel habits Skin: Denies abnormal skin rashes Lymphatics: Denies new lymphadenopathy or easy bruising Neurological:Denies numbness, tingling or new weaknesses Behavioral/Psych: Mood is stable, no new changes  Extremities: No lower extremity edema All other systems were reviewed with the patient and are negative.  I have reviewed the past medical history, past surgical history, social history and family history with the patient and they are unchanged from previous note.  ALLERGIES:  is allergic to contrast media [iodinated diagnostic agents].  MEDICATIONS:  Current Outpatient Medications  Medication Sig Dispense Refill  . acetaminophen (TYLENOL) 325 MG tablet Take 650 mg by mouth daily as needed for moderate pain or headache.    Marland Kitchen aspirin 81 MG EC tablet Take 81 mg by mouth daily.  12  . atorvastatin (LIPITOR) 40 MG tablet TAKE ONE TABLET BY MOUTH EVERY DAY 90 tablet 2  . clopidogrel (PLAVIX) 75 MG tablet TAKE 1 TABLET BY MOUTH DAILY WITH BREAKFAST 30 tablet 2  . diphenoxylate-atropine (LOMOTIL) 2.5-0.025 MG tablet TAKE 1 TO 2 TABLETS 4 TIMES DAILY AS NEEDED FOR DIARRHEA 30 tablet 1  . lidocaine-prilocaine (  EMLA) cream APPLY A QUARTER SIZE AMOUNT TO PORT SITE 1 HOUR PRIOR TO CHEMO. DO NOT RUB IN. COVER WITH PLASTIC WRAP.  3  . lisinopril (PRINIVIL,ZESTRIL) 5 MG tablet TAKE ONE TABLET BY MOUTH EVERY DAY 90 tablet 1  . ondansetron (ZOFRAN) 8 MG tablet Take 1 tablet (8 mg total) by mouth every 8 (eight) hours as needed for nausea or vomiting. 30 tablet 2  . pantoprazole (PROTONIX) 40 MG tablet TAKE 1 TABLET BY MOUTH EVERY DAY AT 6:00 AM 30 tablet 2  . prochlorperazine (COMPAZINE) 10 MG tablet Take 1 tablet (10 mg total) by mouth every 6 (six) hours as needed for nausea or vomiting. 30 tablet 2   No current facility-administered medications for this visit.     PHYSICAL  EXAMINATION: ECOG PERFORMANCE STATUS: 1 - Symptomatic but completely ambulatory  Vitals:   05/25/17 1103  BP: (!) 141/67  Pulse: 61  Resp: 16  Temp: 98.7 F (37.1 C)  SpO2: 99%   I have reviewed his vital signs. GENERAL:alert, no distress and comfortable SKIN: skin color, texture, turgor are normal, no rashes or significant lesions EYES: normal, Conjunctiva are pink and non-injected, sclera clear OROPHARYNX:no mucositis, no erythema and lips, buccal mucosa, and tongue normal  LYMPH:  no palpable lymphadenopathy in the cervical, axillary or inguinal LUNGS: clear to auscultation and percussion with normal breathing effort HEART: regular rate & rhythm and no murmurs and no lower extremity edema ABDOMEN:abdomen soft, non-tender and normal bowel sounds MUSCULOSKELETAL:no cyanosis of digits and no clubbing   EXTREMITIES: No lower extremity edema   LABORATORY DATA:  I have reviewed the data as listed CMP Latest Ref Rng & Units 03/29/2017 01/05/2017 09/21/2016  Glucose 65 - 99 mg/dL 84 104(H) 122(H)  BUN 6 - 20 mg/dL 14 11 11   Creatinine 0.61 - 1.24 mg/dL 1.10 1.02 1.08  Sodium 135 - 145 mmol/L 140 141 137  Potassium 3.5 - 5.1 mmol/L 4.1 4.9 3.9  Chloride 101 - 111 mmol/L 107 105 106  CO2 22 - 32 mmol/L 23 24 25   Calcium 8.9 - 10.3 mg/dL 8.8(L) 9.1 8.8(L)  Total Protein 6.5 - 8.1 g/dL 6.7 6.4 6.7  Total Bilirubin 0.3 - 1.2 mg/dL 0.5 0.3 0.6  Alkaline Phos 38 - 126 U/L 120 142(H) 110  AST 15 - 41 U/L 15 15 14(L)  ALT 17 - 63 U/L 11(L) 7 10(L)   No results found for: AYT016   Lab Results  Component Value Date   WBC 9.1 03/29/2017   HGB 12.5 (L) 03/29/2017   HCT 39.2 03/29/2017   MCV 92.2 03/29/2017   PLT 192 03/29/2017   NEUTROABS 5.7 03/29/2017    ASSESSMENT & PLAN:  Gastric adenocarcinoma ([place]) 1.  Stage III (T2N3A) gastric adenocarcinoma: This was resected in April 2017.  Patient did receive 2 cycles of adjuvant FOLFOX, discontinued it after left foot drop.  He is  under surveillance at this time.  I have discussed the findings on the PET/CT scan which showed few millimeter increase in bilateral lung nodules.  At this time, he is reluctant to consider any treatments even metastatic disease is confirmed.  2.  Stage IIIb (T3N1B) cecal adenocarcinoma: Patient refused adjuvant chemotherapy after 2 cycles of FOLFOX secondary to neuropathy and left foot drop.  His CEA level was 5.6.  We will repeat a CEA level at next visit in 4 months.  We will also help PET scan repeated.  3.  History of iron deficiency state: He was told  to take 1 iron tablet daily.  4.  Peripheral neuropathy: He does not report any tingling or numbness.  But his feet feel hot at times mostly at nights.  He is able to do all activities.  5.  Stage II rectosigmoid cancer: There is slight uptake at the anastomosis on PET scan without CT correlate.  We will keep a close eye on it.  His bowel habits have not changed.  He has slight diarrhea since surgery.       Orders Placed This Encounter  Procedures  . NM PET Image Restag (PS) Skull Base To Thigh    Standing Status:   Future    Standing Expiration Date:   05/25/2018    Order Specific Question:   If indicated for the ordered procedure, I authorize the administration of a radiopharmaceutical per Radiology protocol    Answer:   Yes    Order Specific Question:   Preferred imaging location?    Answer:   [facility]    Order Specific Question:   Radiology Contrast Protocol - do NOT remove file path    Answer:   [email or web address]    Order Specific Question:   Reason for Exam additional comments    Answer:   History of stage III gastric cancer, stage III cecal cancer, stage II rectal cancer, surveillance   The patient has a good understanding of the overall plan. he agrees with it. he will call with any problems that may develop before the next visit here.   Derek Jack, MD 05/25/17

## 2017-05-25 NOTE — Patient Instructions (Signed)
[facility] Discharge Instructions  You were seen today by Dr. Katragadda    Thank you for choosing [facility] at [facility] to provide your oncology and hematology care.  To afford each patient quality time with our provider, please arrive at least 15 minutes before your scheduled appointment time.   If you have a lab appointment with the Cancer Center please come in thru the  Main Entrance and check in at the main information desk  You need to re-schedule your appointment should you arrive 10 or more minutes late.  We strive to give you quality time with our providers, and arriving late affects you and other patients whose appointments are after yours.  Also, if you no show three or more times for appointments you may be dismissed from the clinic at the providers discretion.     Again, thank you for choosing [facility].  Our hope is that these requests will decrease the amount of time that you wait before being seen by our physicians.       _____________________________________________________________  Should you have questions after your visit to [facility], please contact our office at [phone] between the hours of 8:30 a.m. and 4:30 p.m.  Voicemails left after 4:30 p.m. will not be returned until the following business day.  For prescription refill requests, have your pharmacy contact our office.       Resources For Cancer Patients and their Caregivers ? American Cancer Society: Can assist with transportation, wigs, general needs, runs Look Good Feel Better.        1-888-227-6333 ? Cancer Care: Provides financial assistance, online support groups, medication/co-pay assistance.  1-800-813-HOPE (4673) ? [facility] Assists [place] cancer patients and their families through emotional , educational and financial support.  336-427-4357 ? [place] DSS Where to  apply for food stamps, Medicaid and utility assistance. 336-342-1394 ? [facility]: Transportation to medical appointments. 336-347-2287 ? Social Security Administration: May apply for disability if have a Stage IV cancer. 336-342-7796 1-800-772-1213 ? [place] Aging, Disability and Transit Services: Assists with nutrition, care and transit needs. 336-349-2343  Cancer Center Support Programs:   > Cancer Support Group  2nd Tuesday of the month 1pm-2pm, Journey Room   > Creative Journey  3rd Tuesday of the month 1130am-1pm, Journey Room    

## 2017-06-01 ENCOUNTER — Other Ambulatory Visit: Payer: Self-pay | Admitting: Cardiovascular Disease

## 2017-06-01 DIAGNOSIS — C169 Malignant neoplasm of stomach, unspecified: Secondary | ICD-10-CM

## 2017-06-01 DIAGNOSIS — C19 Malignant neoplasm of rectosigmoid junction: Secondary | ICD-10-CM

## 2017-06-01 DIAGNOSIS — C180 Malignant neoplasm of cecum: Secondary | ICD-10-CM

## 2017-06-27 ENCOUNTER — Encounter: Payer: Self-pay | Admitting: Internal Medicine

## 2017-07-03 IMAGING — XA IR FLUORO GUIDE CV LINE*R*
1 series · 1 of 1 positions shown · non-contrast
Comparison: none

CLINICAL DATA: Colon carcinoma and need for porta cath to begin
chemotherapy.

[Series 300: line placements · 1 of 1 slices shown]
[im 1/1]
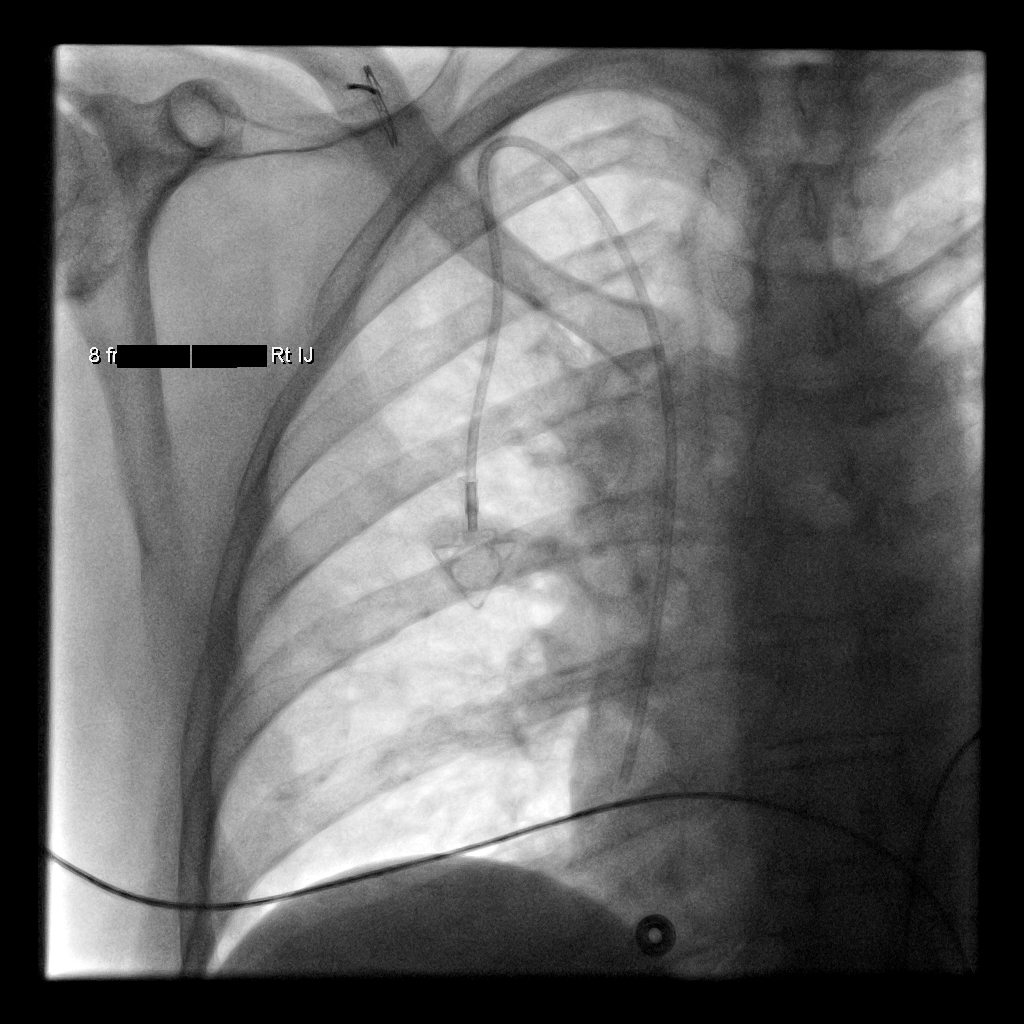

[1 of 1 positions shown; findings below may reference images not displayed]

EXAM:
IMPLANTED PORT A CATH PLACEMENT WITH ULTRASOUND AND FLUOROSCOPIC
GUIDANCE

ANESTHESIA/SEDATION:
3.0 Mg IV Versed; 50 mcg IV Fentanyl

Total Moderate Sedation Time:  33 minutes

The patient's level of consciousness and physiologic status were
continuously monitored during the procedure by Radiology nursing.

Additional Medications: 2 g IV Ancef. As antibiotic prophylaxis,
Ancef was ordered pre-procedure and administered intravenously
within one hour of incision.

FLUOROSCOPY TIME:  12 seconds.

PROCEDURE:
The procedure, risks, benefits, and alternatives were explained to
the patient. Questions regarding the procedure were encouraged and
answered. The patient understands and consents to the procedure. A
time-out was performed prior to initiating the procedure.

Ultrasound was used to confirm patency of the right internal jugular
vein. The right neck and chest were prepped with chlorhexidine in a
sterile fashion, and a sterile drape was applied covering the
operative field. Maximum barrier sterile technique with sterile
gowns and gloves were used for the procedure. Local anesthesia was
provided with 1% lidocaine.

After creating a small venotomy incision, a 21 gauge needle was
advanced into the right internal jugular vein under direct,
real-time ultrasound guidance. Ultrasound image documentation was
performed. After securing guidewire access, an 8 Fr dilator was
placed. A J-wire was kinked to measure appropriate catheter length.

A subcutaneous port pocket was then created along the upper chest
wall utilizing sharp and blunt dissection. Portable cautery was
utilized. The pocket was irrigated with sterile saline.

A single lumen power injectable port was chosen for placement. The 8
Fr catheter was tunneled from the port pocket site to the venotomy
incision. The port was placed in the pocket. External catheter was
trimmed to appropriate length based on guidewire measurement.

At the venotomy, an 8 Fr peel-away sheath was placed over a
guidewire. The catheter was then placed through the sheath and the
sheath removed. Final catheter positioning was confirmed and
documented with a fluoroscopic spot image. The port was accessed
with a needle and aspirated and flushed with heparinized saline. The
needle was removed.

The venotomy and port pocket incisions were closed with subcutaneous
3-0 Monocryl and subcuticular 4-0 Vicryl. Dermabond was applied to
both incisions.

COMPLICATIONS:
None
FINDINGS: After catheter placement, the tip lies at the cavoatrial junction.
The catheter aspirates normally and is ready for immediate use.
IMPRESSION: Placement of single lumen port a cath via right internal jugular
vein. The catheter tip lies at the cavoatrial junction. A power
injectable port a cath was placed and is ready for immediate use.

## 2017-07-09 ENCOUNTER — Other Ambulatory Visit: Payer: Self-pay | Admitting: Cardiovascular Disease

## 2017-07-13 ENCOUNTER — Ambulatory Visit (INDEPENDENT_AMBULATORY_CARE_PROVIDER_SITE_OTHER): Payer: Medicare HMO | Admitting: Cardiovascular Disease

## 2017-07-13 ENCOUNTER — Encounter: Payer: Self-pay | Admitting: Cardiovascular Disease

## 2017-07-13 VITALS — BP 134/64 | HR 54 | Ht 71.0 in | Wt 165.2 lb

## 2017-07-13 DIAGNOSIS — Z720 Tobacco use: Secondary | ICD-10-CM

## 2017-07-13 DIAGNOSIS — E785 Hyperlipidemia, unspecified: Secondary | ICD-10-CM | POA: Diagnosis not present

## 2017-07-13 DIAGNOSIS — M79604 Pain in right leg: Secondary | ICD-10-CM | POA: Diagnosis not present

## 2017-07-13 DIAGNOSIS — I2510 Atherosclerotic heart disease of native coronary artery without angina pectoris: Secondary | ICD-10-CM | POA: Diagnosis not present

## 2017-07-13 DIAGNOSIS — M79605 Pain in left leg: Secondary | ICD-10-CM | POA: Diagnosis not present

## 2017-07-13 MED ORDER — EZETIMIBE 10 MG PO TABS: 10.0000 mg | ORAL_TABLET | Freq: Every day | ORAL | 3 refills | Status: DC

## 2017-07-13 NOTE — Progress Notes (Signed)
[name], male   DOB: [birth year], 72 y.o.   MRN: [id number]     [name] is a 72 y.o. male who presents for a 6 month followup cardiology evaluation.  Steven Drake has established CAD and was transferred acutely from [facility] on 01/19/2013 with possible ST segment elevation myocardial infarction.  Initial troponin was negative but a subsequent troponin was positive and he was noted to have anterolateral ST changes. Upon arrival to [facility] he had T-wave inversion in 1 and aVL, V4 through V6. Cardiac catheterization done emergently by me showed low normal LV function with moderately severe focal hypocontractility of the mid anterolateral wall and minimal hypocontractility of the focal inferolateral wall. Ejection fraction was approximately 50-55%. He was found to have 40% smooth ostial narrowing of the LAD, a dominant left circumflex vessel with 70% ostial narrowing in the first marginal branch and diffuse 50% narrowing in the second marginal branch. His RCA was nondominant and had a 95% proximal stenosis and he had a subtotal/total mid occlusion with moderate right to right bridging collaterals and extensive left to right collaterals. Medical therapy was recommended and smoking cessation was felt to be imperative.   He has a long-standing history of tobacco and continues to smoke 1 ppd.  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  He continues to be on dual antiplatelet therapy and denies bleeding.  He also is on lisinopril 5 mg, amlodipine 2.5 mg and atorvastatin 40 mg. initially had been on Zetia but is no longer taking this.  He has history of GERD for which she is on Protonix.  He presents for evaluation.  Laboratory from 08/24/2014.  He had been working outside in significant heat.  Hemoglobin was 13.2 medical 38.9.  BUN 15, creatinine 1.53, which had increased from one year ago.   He is on a reduced dose of lisinopril at 5 mg.  Cholesterol was 140, triglycerides are elevated at 202, HDL was low at 25, and LDL 75.  TSH was normal.  Lower extremity Doppler evaluation in January 2017  revealed triphasic waveforms.  ABI was 1.1 on the right 1.0 on the left.  However, bilateral great toe PPG's were abnormal.  He underwent surgery, both for colon cancer and gastric adenocarcinoma with a laparoscopic subtotal colectomy with ileorectal anastomosis and open distal gastrectomy with Billroth II reconstruction on 08/02/2015.  He tolerated surgery without cardiovascular compromise.    When I saw him in 2018, he continued to smoke cigarettes.  He started smoking at age 29 and has been smoking for over 43 years.  He denied any chest tightness potation  Since I last saw him in November 2018 denies any chest pain.  He does note some mild shortness of breath with activity.  He is unaware of palpitations.  He continues to smoke 1 pack of cigarettes per day.  He now is seeing Dr. Warrick Parisian in [name] for his primary care.  He has been on atorvastatin 40 mg for hyperlipidemia and isinopril 5 mg for hypertension.  He continues to be on dual antiplatelet therapy with aspirin and Plavix.  He has GERD and is on pantoprazole.  He recently developed some hip pain with walking.  He presents for reevaluation.  Past Medical History  Diagnosis Date  . GERD (gastroesophageal reflux disease)   . STEMI (ST elevation myocardial infarction), 01/19/13 01/19/2013  . CAD (coronary artery disease)  RCA non dominant vessel, LAD 40%, 80-90% 2nd diag EF 55%  01/19/2013  . Hyperlipidemia LDL goal < 70 01/19/2013  . Pulmonary nodules 01/19/2013  . Hypokalemia 01/20/2013  . Tobacco abuse 01/20/2013  . Metabolic syndrome, with mildly elevated HgBA1C 01/20/2013    Past Surgical History  Procedure Laterality Date  . Appendectomy      No Known Allergies  Current Outpatient Prescriptions  Medication Sig Dispense  Refill  . acetaminophen (TYLENOL) 325 MG tablet Take 2 tablets (650 mg total) by mouth every 4 (four) hours as needed for headache or mild pain.      Marland Kitchen aspirin EC 81 MG EC tablet Take 1 tablet (81 mg total) by mouth daily.      . nitroGLYCERIN (NITROSTAT) 0.4 MG SL tablet Place 1 tablet (0.4 mg total) under the tongue every 5 (five) minutes x 3 doses as needed for chest pain.  25 tablet  4  . amLODipine (NORVASC) 2.5 MG tablet Take 1 tablet (2.5 mg total) by mouth daily.  30 tablet  6  . atorvastatin (LIPITOR) 80 MG tablet Take 1 tablet (80 mg total) by mouth daily at 6 PM.  30 tablet  6  . clopidogrel (PLAVIX) 75 MG tablet Take 1 tablet (75 mg total) by mouth daily with breakfast.  30 tablet  6  . lisinopril (PRINIVIL,ZESTRIL) 5 MG tablet Take 1 tablet (5 mg total) by mouth daily.  30 tablet  6  . pantoprazole (PROTONIX) 40 MG tablet Take 1 tablet (40 mg total) by mouth daily at 6 (six) AM.  30 tablet  6   No current facility-administered medications for this visit.    History   Social History  . Marital Status: Widowed    Spouse Name: N/A    Number of Children: N/A  . Years of Education: N/A   Occupational History  . farmer    Social History Main Topics  . Smoking status: Current Every Day Smoker -- 1.00 packs/day for 75 years    Types: Cigarettes    Start date: 01/21/1963  . Smokeless tobacco: Never Used  . Alcohol Use: Not on file  . Drug Use: Not on file  . Sexual Activity: Not on file   Other Topics Concern  . Not on file   Social History Narrative  . No narrative on file   Social history is notable in that he is widowed. He does not have children. He is retired.  He continues to smoke cigarettes, 1 pack/day. Family history is notable for parents are deceased.  ROS General: Negative; No fevers, chills, or night sweats;  HEENT: Negative; No changes in vision or hearing, sinus congestion, difficulty swallowing Pulmonary: Negative; No cough, wheezing, shortness of  breath, hemoptysis Cardiovascular: Negative; No chest pain, presyncope, syncope, palpatations GI: Negative; No nausea, vomiting, diarrhea, or abdominal pain GU: Negative; No dysuria, hematuria, or difficulty voiding Musculoskeletal: Occasional shoulder discomfort; no myalgias,  or weakness Hematologic/Oncology: Negative; no easy bruising, bleeding Endocrine: Negative; no heat/cold intolerance; no diabetes Neuro: Negative; no changes in balance, headaches Skin: Negative; No rashes or skin lesions Psychiatric: Negative; No behavioral problems, depression Sleep: Negative; No snoring, daytime sleepiness, hypersomnolence, bruxism, restless legs, hypnogognic hallucinations, no cataplexy Other comprehensive 14 point system review is negative.   PE BP 134/64 (BP Location: Left Arm)   Pulse (!) 54   Ht _0  (1.803 m)   Wt 165 lb 3.2 oz (74.9 kg)   BMI 23.04 kg/m    Repeat blood pressure  124/70  Wt Readings from Last 3 Encounters:  07/13/17 165 lb 3.2 oz (74.9 kg)  04/27/17 171 lb (77.6 kg)  04/05/17 172 lb (78 kg)    General: Alert, oriented, no distress.  Appears older than stated age, significant facial wrinkles. Skin: normal turgor, no rashes, warm and dry HEENT: Normocephalic, atraumatic. Pupils equal round and reactive to light; sclera anicteric; extraocular muscles intact;  Nose without nasal septal hypertrophy Mouth/Parynx benign; Mallinpatti scale 3 Neck: No JVD, no carotid bruits; normal carotid upstroke Lungs: clear to ausculatation and percussion; no wheezing or rales Chest wall: without tenderness to palpitation Heart: PMI not displaced, RRR, s1 s2 normal, 1/6 systolic murmur, no diastolic murmur, no rubs, gallops, thrills, or heaves Abdomen: soft, nontender; no hepatosplenomehaly, BS+; abdominal aorta nontender and not dilated by palpation. Back: no CVA tenderness Pulses 2+ Musculoskeletal: full range of motion, normal strength, no joint deformities; pain with  walking Extremities: no clubbing cyanosis or edema, Homan's sign negative  Neurologic: grossly nonfocal; Cranial nerves grossly wnl Psychologic: Normal mood and affect   ECG (independently read by me): Bradycardia 54 bpm.  Nonspecific ST-T abnormality.  Normal intervals.  No ectopy.  November 2018 ECG (independently read by me): Sinus bradycardia 59 bpm.  No ectopy.  Normal intervals.  No ST segment changes  October 2017 ECG (independently read by me): Sinus bradycardia at 59 bpm.  January 2017 ECG (independently read by me): Sinus bradycardia with 1 PAC.  Heart rate 54 bpm.  Normal intervals.  No significant ST-T changes.  June 2016 ECG (independently read by me): Sinus bradycardia 53 bpm.  January 2016 ECG (independently read by me): Sinus bradycardia 51 bpm.  July 2015 ECG (independently read by me): Sinus bradycardia 51 beats per minute.  No ectopy.  QTc interval 414 ms.  Prior 05/06/2013 ECG (independently read by me): Sinus bradycardia 51 beats per minute. No ectopy. No significant ST changes.  Prior ECG from 02/04/2013 ECG: Sinus rhythm with T-wave inversion in leads 1 and avL and V2  LABS: BMP Latest Ref Rng & Units 03/29/2017 01/05/2017 09/21/2016  Glucose 65 - 99 mg/dL 84 104(H) 122(H)  BUN 6 - 20 mg/dL _0 Creatinine 0.61 - 1.24 mg/dL 1.10 1.02 1.08  BUN/Creat Ratio 10 - 24 - 11 -  Sodium 135 - 145 mmol/L 140 141 137  Potassium 3.5 - 5.1 mmol/L 4.1 4.9 3.9  Chloride 101 - 111 mmol/L 107 105 106  CO2 22 - 32 mmol/L _1 Calcium 8.9 - 10.3 mg/dL 8.8(L) 9.1 8.8(L)   Hepatic Function Latest Ref Rng & Units 03/29/2017 01/05/2017 09/21/2016  Total Protein 6.5 - 8.1 g/dL 6.7 6.4 6.7  Albumin 3.5 - 5.0 g/dL 3.6 3.9 3.4(L)  AST 15 - 41 U/L 15 15 14(L)  ALT 17 - 63 U/L 11(L) 7 10(L)  Alk Phosphatase 38 - 126 U/L 120 142(H) 110  Total Bilirubin 0.3 - 1.2 mg/dL 0.5 0.3 0.6   CBC Latest Ref Rng & Units 03/29/2017 01/05/2017 09/21/2016  WBC 4.0 - 10.5 K/uL 9.1 8.8 7.2   Hemoglobin 13.0 - 17.0 g/dL 12.5(L) 13.0 12.2(L)  Hematocrit 39.0 - 52.0 % 39.2 40.5 36.9(L)  Platelets 150 - 400 K/uL 192 233 187   Lab Results  Component Value Date   MCV 92.2 03/29/2017   MCV 91 01/05/2017   MCV 92.7 09/21/2016   Lab Results  Component Value Date   TSH 1.130 01/05/2017   Lab Results  Component Value Date  HGBA1C 6.2 (H) 07/29/2015   Lipid Panel     Component Value Date/Time   CHOL 132 01/05/2017 0842   TRIG 89 01/05/2017 0842   HDL 30 (L) 01/05/2017 0842   CHOLHDL 4.4 01/05/2017 0842   CHOLHDL 4.8 04/13/2015 1252   VLDL 32 (H) 04/13/2015 1252   LDLCALC 84 01/05/2017 0842     RADIOLOGY: No results found.  IMPRESSION:  1. CAD in native artery   2. Hyperlipidemia with target LDL less than 70   3. Tobacco abuse   4. Pain in both lower extremities     ASSESSMENT AND PLAN: Steven Drake is a 72 year old gentleman who presented with an acute coronary syndrome and underwent emergent cardiac catheterization on11/23/2014. He was found to have subtotal occlusion of his RCA and had both right to right and extensive left-to-right collaterals. He also had concomitant CAD as noted above with 80-90% stenosis in a small second diagonal branch of his LAD as well as 70 and 50% stenoses in his marginal vessels. He had a mild wall motion abnormality consistent with his diagonal stenosis.  He has been on medical therapy.  He needs to be without recurrent anginal type symptoms on his medical regimen consisting of dual antiplatelet therapy, lisinopril, and statin therapy.  Pressure today is stable and repeat by me was 128/70.  I again had a long discussion with him concerning the necessity to quit smoking.  He does not appear to have any interest.  November 2018 showed an LDL of 84.  He has been on atorvastatin 40 mg.  I am adding Zetia 10 mg to his medical regimen.  He has femoral bruits bilaterally.  He has experienced hip pain with walking he keeps busy  but does not  typically walk for long duration.  With his long-standing tobacco history I am scheduling him for lower extremity arterial Doppler studies to assess for PVD.  I will repeat laboratory in 3 months and see him in 4 months for reevaluation    Time spent: 25 minutes Troy Sine, MD, [facility]  10:27 AM  07/15/2017

## 2017-07-13 NOTE — Patient Instructions (Signed)
Medication Instructions:  START Zetia 10 mg daily  Labwork: Please return for FASTING labs in 3 months (CMET, Lipid)  Our in office lab hours are Monday-Friday 8:00-4:00, closed for lunch 12:45-1:45 pm.  No appointment needed.  Testing/Procedures: Your physician has requested that you have a lower extremity arterial duplex. This test is an ultrasound of the arteries in the legs. It looks at arterial blood flow in the legs. Allow one hour for Lower Arterial scans. There are no restrictions or special instructions  Follow-Up: 4 months with Dr. Claiborne Billings  Any Other Special Instructions Will Be Listed Below (If Applicable).     If you need a refill on your cardiac medications before your next appointment, please call your pharmacy.

## 2017-07-15 ENCOUNTER — Encounter: Payer: Self-pay | Admitting: Cardiovascular Disease

## 2017-07-16 ENCOUNTER — Telehealth: Payer: Self-pay | Admitting: Cardiovascular Disease

## 2017-07-16 NOTE — Telephone Encounter (Signed)
Called patient and LVM for patient to call back to schedule LEA and followup with Dr. Claiborne Billings in 4 months.

## 2017-07-19 ENCOUNTER — Other Ambulatory Visit: Payer: Self-pay | Admitting: Cardiovascular Disease

## 2017-07-19 DIAGNOSIS — M79605 Pain in left leg: Principal | ICD-10-CM

## 2017-07-19 DIAGNOSIS — M79604 Pain in right leg: Secondary | ICD-10-CM

## 2017-07-19 DIAGNOSIS — I739 Peripheral vascular disease, unspecified: Secondary | ICD-10-CM

## 2017-07-23 ENCOUNTER — Other Ambulatory Visit: Payer: Self-pay | Admitting: Cardiovascular Disease

## 2017-07-24 ENCOUNTER — Inpatient Hospital Stay (HOSPITAL_COMMUNITY): Payer: Medicare HMO | Attending: Internal Medicine

## 2017-07-24 ENCOUNTER — Other Ambulatory Visit: Payer: Self-pay

## 2017-07-24 ENCOUNTER — Encounter (HOSPITAL_COMMUNITY): Payer: Self-pay

## 2017-07-24 DIAGNOSIS — Z452 Encounter for adjustment and management of vascular access device: Secondary | ICD-10-CM | POA: Diagnosis not present

## 2017-07-24 DIAGNOSIS — C169 Malignant neoplasm of stomach, unspecified: Secondary | ICD-10-CM | POA: Diagnosis not present

## 2017-07-24 MED ORDER — SODIUM CHLORIDE 0.9% FLUSH
10.0000 mL | INTRAVENOUS | Status: DC | PRN
  Administered 2017-07-24: 10 mL via INTRAVENOUS
  Filled 2017-07-24: qty 10

## 2017-07-24 MED ORDER — HEPARIN SOD (PORK) LOCK FLUSH 100 UNIT/ML IV SOLN
500.0000 [IU] | Freq: Once | INTRAVENOUS | Status: AC
  Administered 2017-07-24: 500 [IU] via INTRAVENOUS

## 2017-07-24 NOTE — Progress Notes (Signed)
Steven Drake presented for Portacath access and flush. Portacath located right chest wall accessed with  H 20 needle. Good blood return present. Portacath flushed with 35ml NS and 500U/30ml Heparin and needle removed intact. Procedure without incident. Patient tolerated procedure well.  Treatment given per orders. Patient tolerated it well without problems. Vitals stable and discharged home from clinic ambulatory. Follow up as scheduled.

## 2017-07-24 NOTE — Telephone Encounter (Signed)
Rx sent to pharmacy   

## 2017-07-25 ENCOUNTER — Ambulatory Visit (HOSPITAL_COMMUNITY)
Admission: RE | Admit: 2017-07-25 | Discharge: 2017-07-25 | Disposition: A | Discharge status: Home / Self Care | Payer: Medicare HMO | Source: Ambulatory Visit | Attending: Cardiology | Admitting: Cardiology

## 2017-07-25 ENCOUNTER — Encounter (HOSPITAL_COMMUNITY): Payer: Medicare HMO

## 2017-07-25 DIAGNOSIS — F17200 Nicotine dependence, unspecified, uncomplicated: Secondary | ICD-10-CM | POA: Insufficient documentation

## 2017-07-25 DIAGNOSIS — I739 Peripheral vascular disease, unspecified: Secondary | ICD-10-CM | POA: Diagnosis not present

## 2017-07-25 DIAGNOSIS — M79605 Pain in left leg: Secondary | ICD-10-CM | POA: Diagnosis not present

## 2017-07-25 DIAGNOSIS — I10 Essential (primary) hypertension: Secondary | ICD-10-CM | POA: Insufficient documentation

## 2017-07-25 DIAGNOSIS — I2510 Atherosclerotic heart disease of native coronary artery without angina pectoris: Secondary | ICD-10-CM | POA: Insufficient documentation

## 2017-07-25 DIAGNOSIS — E785 Hyperlipidemia, unspecified: Secondary | ICD-10-CM | POA: Insufficient documentation

## 2017-07-25 DIAGNOSIS — M79604 Pain in right leg: Secondary | ICD-10-CM | POA: Diagnosis not present

## 2017-07-25 DIAGNOSIS — I70203 Unspecified atherosclerosis of native arteries of extremities, bilateral legs: Secondary | ICD-10-CM | POA: Diagnosis not present

## 2017-08-15 ENCOUNTER — Other Ambulatory Visit: Payer: Self-pay | Admitting: *Deleted

## 2017-09-11 ENCOUNTER — Ambulatory Visit (INDEPENDENT_AMBULATORY_CARE_PROVIDER_SITE_OTHER): Payer: Medicare HMO | Admitting: Cardiovascular Disease

## 2017-09-11 ENCOUNTER — Encounter: Payer: Self-pay | Admitting: Cardiovascular Disease

## 2017-09-11 VITALS — BP 124/63 | HR 60 | Ht 71.0 in | Wt 163.6 lb

## 2017-09-11 DIAGNOSIS — E785 Hyperlipidemia, unspecified: Secondary | ICD-10-CM | POA: Diagnosis not present

## 2017-09-11 DIAGNOSIS — I2510 Atherosclerotic heart disease of native coronary artery without angina pectoris: Secondary | ICD-10-CM

## 2017-09-11 DIAGNOSIS — I10 Essential (primary) hypertension: Secondary | ICD-10-CM | POA: Diagnosis not present

## 2017-09-11 DIAGNOSIS — I739 Peripheral vascular disease, unspecified: Secondary | ICD-10-CM | POA: Diagnosis not present

## 2017-09-11 DIAGNOSIS — Z720 Tobacco use: Secondary | ICD-10-CM | POA: Diagnosis not present

## 2017-09-11 LAB — COMPREHENSIVE METABOLIC PANEL
ALBUMIN: 3.9 g/dL (ref 3.5–4.8)
ALT: 11 IU/L (ref 0–44)
AST: 15 IU/L (ref 0–40)
Albumin/Globulin Ratio: 1.6 (ref 1.2–2.2)
Alkaline Phosphatase: 131 IU/L — ABNORMAL HIGH (ref 39–117)
BUN / CREAT RATIO: 12 (ref 10–24)
BUN: 19 mg/dL (ref 8–27)
Bilirubin Total: 0.6 mg/dL (ref 0.0–1.2)
CO2: 23 mmol/L (ref 20–29)
CREATININE: 1.53 mg/dL — AB (ref 0.76–1.27)
Calcium: 8.9 mg/dL (ref 8.6–10.2)
Chloride: 106 mmol/L (ref 96–106)
GFR calc non Af Amer: 44 mL/min/{1.73_m2} — ABNORMAL LOW (ref >59)
GFR, EST AFRICAN AMERICAN: 51 mL/min/{1.73_m2} — AB (ref >59)
GLUCOSE: 106 mg/dL — AB (ref 65–99)
Globulin, Total: 2.4 g/dL (ref 1.5–4.5)
Potassium: 4.7 mmol/L (ref 3.5–5.2)
Sodium: 143 mmol/L (ref 134–144)
TOTAL PROTEIN: 6.3 g/dL (ref 6.0–8.5)

## 2017-09-11 LAB — LIPID PANEL
Chol/HDL Ratio: 3.9 ratio (ref 0.0–5.0)
Cholesterol, Total: 109 mg/dL (ref 100–199)
HDL: 28 mg/dL — AB (ref >39)
LDL CALC: 60 mg/dL (ref 0–99)
Triglycerides: 106 mg/dL (ref 0–149)
VLDL CHOLESTEROL CAL: 21 mg/dL (ref 5–40)

## 2017-09-11 NOTE — Patient Instructions (Signed)

## 2017-09-11 NOTE — Progress Notes (Signed)
Cardiology Office Note   Date:  09/11/2017   ID:  Steven Drake, DOB 07-01-1945, MRN [id number]  PCP:  Dettinger, Fransisca Kaufmann, MD  Cardiologist: Dr. Claiborne Billings  No chief complaint on file.     History of Present Illness: Steven Drake is a 72 y.o. male who was referred by Dr. Claiborne Billings for evaluation and management of peripheral arterial disease.  He has known history of coronary artery disease with previous myocardial infarction in 2014.  He was found to have mild to moderate diffuse disease with subtotal occlusion of nondominant RCA.  He was treated medically.  He has multiple chronic medical conditions that include essential hypertension, hyperlipidemia and tobacco use.  He also has history of colon cancer and gastric adenocarcinoma status post resection. He underwent vascular studies in May which showed an ABI of 0.92 on the right and 0.85 on the left.  Duplex showed moderate right common femoral artery stenosis and moderate right SFA disease.  On the left, there was moderate left SFA disease.  He reports bilateral calf discomfort which happens at the variable distance and also does not happen on a consistent basis.  He complains of lower back discomfort.  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  He works on a farm all day long.  He has no lower extremity ulceration.   Past Medical History:  Diagnosis Date  . Adenocarcinoma of cecum ([place]) 07/01/2015  . 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 CAD (coronary artery disease) RCA non dominant vessel, LAD 40%, 80-90% 2nd diag EF 55%  01/19/2013   Dr. Claiborne Billings [place] 03-05-15 Epic.  . Family history of colon cancer   . Family history of stomach cancer   . Gastric adenocarcinoma ([place]) 06/29/2015  . Gastric ulcer    many yrs ago  . GERD (gastroesophageal reflux disease)   . Hyperlipidemia LDL goal < 70 01/19/2013  . Hypertension   . Hypokalemia 01/20/2013  . Metabolic syndrome, with mildly  elevated HgBA1C 01/20/2013  . Pulmonary nodules 01/19/2013  . STEMI (ST elevation myocardial infarction), 01/19/13 01/19/2013  . Tobacco abuse 01/20/2013  . Transfusion history    with gastric ulcer- many yrs ago    Past Surgical History:  Procedure Laterality Date  . APPENDECTOMY    . BIOPSY  10/05/2016   Procedure: BIOPSY;  Surgeon: Daneil Dolin, MD;  Location: [facility];  Service: Endoscopy;;  sigmoid anastomosis  . CATARACT EXTRACTION, BILATERAL    . COLONOSCOPY N/A 06/10/2015   RMR: cecal neoplasm  ? biopsied. large polyp at the hepatic flexure removed with piecmeal polypectomy and APC ablation. lesion tattooed. Large polyp in the mid sigmoid status post piecemeal hot snare debulking and tattooing. this lesion not completely removed. scatterd pancolonic diverticulsosis. no speciments collected.   . ESOPHAGOGASTRODUODENOSCOPY N/A 06/10/2015   Procedure: ESOPHAGOGASTRODUODENOSCOPY (EGD);  Surgeon: Daneil Dolin, MD;  Location: [facility];  Service: Endoscopy;  Laterality: N/A;  . ESOPHAGOGASTRODUODENOSCOPY N/A 06/17/2015   RMR: normal esophagus small hiatal hernia. Abnormal nodular antrum .pyloric channel status post biopsy.   Marland Kitchen FLEXIBLE SIGMOIDOSCOPY N/A 10/05/2016   Procedure: FLEXIBLE SIGMOIDOSCOPY;  Surgeon: Daneil Dolin, MD;  Location: [facility];  Service: Endoscopy;  Laterality: N/A;  130   . HERNIA REPAIR Right   . LAPAROSCOPIC SUBTOTAL COLECTOMY N/A 08/02/2015   Procedure: LAPAROSCOPIC SUBTOTAL COLECTOMY  AND DISTAL GASTRECTOMY ;  Surgeon: Stark Klein, MD;  Location: [facility] ORS;  Service: General;  Laterality: N/A;  . LEFT HEART CATH N/A 01/19/2013   Procedure: LEFT HEART CATH;  Surgeon: Troy Sine, MD;  Location: [facility] CATH LAB;  Service: Cardiovascular;  Laterality: N/A;     Current Outpatient Medications  Medication Sig Dispense Refill  . acetaminophen (TYLENOL) 325 MG tablet Take 650 mg by mouth daily as needed for moderate pain or headache.    Marland Kitchen aspirin 81 MG  EC tablet Take 81 mg by mouth daily.  12  . atorvastatin (LIPITOR) 40 MG tablet TAKE ONE TABLET BY MOUTH EVERY DAY 90 tablet 2  . clopidogrel (PLAVIX) 75 MG tablet TAKE ONE TABLET BY MOUTH DAILY WITH BREAKFAST 30 tablet 2  . diphenoxylate-atropine (LOMOTIL) 2.5-0.025 MG tablet TAKE 1 TO 2 TABLETS 4 TIMES DAILY AS NEEDED FOR DIARRHEA 30 tablet 1  . ezetimibe (ZETIA) 10 MG tablet Take 1 tablet (10 mg total) by mouth daily. 90 tablet 3  . lidocaine-prilocaine (EMLA) cream APPLY A QUARTER SIZE AMOUNT TO PORT SITE 1 HOUR PRIOR TO CHEMO. DO NOT RUB IN. COVER WITH PLASTIC WRAP.  3  . lisinopril (PRINIVIL,ZESTRIL) 5 MG tablet TAKE ONE TABLET BY MOUTH EVERY DAY 90 tablet 1  . ondansetron (ZOFRAN) 8 MG tablet Take 1 tablet (8 mg total) by mouth every 8 (eight) hours as needed for nausea or vomiting. 30 tablet 2  . pantoprazole (PROTONIX) 40 MG tablet TAKE ONE TABLET BY MOUTH DAILY AT 6:00 A.M. 30 tablet 2  . prochlorperazine (COMPAZINE) 10 MG tablet Take 1 tablet (10 mg total) by mouth every 6 (six) hours as needed for nausea or vomiting. 30 tablet 2   No current facility-administered medications for this visit.     Allergies:   Contrast media [iodinated diagnostic agents]    Social History:  The patient  reports that he has been smoking cigarettes.  He started smoking about 54 years ago. He has a 58.00 pack-year smoking history. He has never used smokeless tobacco. He reports that he does not drink alcohol or use drugs.   Family History:  The patient's family history includes Colon cancer in his father, paternal uncle, and sister; Heart attack in his mother; Stomach cancer in his sister.    ROS:  Please see the history of present illness.   Otherwise, review of systems are positive for none.   All other systems are reviewed and negative.    PHYSICAL EXAM: VS:  BP 124/63   Pulse 60   Ht 5\' 11"  (1.803 m)   Wt 163 lb 9.6 oz (74.2 kg)   BMI 22.82 kg/m  , BMI Body mass index is 22.82 kg/m. GEN:  Well nourished, well developed, in no acute distress  HEENT: normal  Neck: no JVD, carotid bruits, or masses Cardiac: RRR; no murmurs, rubs, or gallops,no edema  Respiratory:  clear to auscultation bilaterally, normal work of breathing GI: soft, nontender, nondistended, + BS MS: no deformity or atrophy  Skin: warm and dry, no rash Neuro:  Strength and sensation are intact Psych: euthymic mood, full affect Vascular: Femoral pulses slightly diminished bilaterally.  Distal pulses are not palpable.  EKG:  EKG is not ordered today.   Recent Labs: 01/05/2017: TSH 1.130 03/29/2017: ALT 11; BUN 14; Creatinine, Ser 1.10; Hemoglobin 12.5; Platelets 192; Potassium 4.1; Sodium 140    Lipid Panel    Component Value Date/Time   CHOL 132 01/05/2017 0842   TRIG 89 01/05/2017 0842   HDL 30 (L) 01/05/2017 9211  CHOLHDL 4.4 01/05/2017 0842   CHOLHDL 4.8 04/13/2015 1252   VLDL 32 (H) 04/13/2015 1252   LDLCALC 84 01/05/2017 0842      Wt Readings from Last 3 Encounters:  09/11/17 163 lb 9.6 oz (74.2 kg)  07/13/17 165 lb 3.2 oz (74.9 kg)  04/27/17 171 lb (77.6 kg)      No flowsheet data found.    ASSESSMENT AND PLAN:  1.  Peripheral arterial disease: Mildly reduced ABI bilaterally with mild to moderate disease by duplex which does not seem to be obstructive.  The patient has atypical claudication with symptoms happening at variable distance and not on a daily basis which raises the possibility of pseudoclaudication.  Nonetheless, his symptoms are not lifestyle limiting and I recommend continuing aggressive treatment of risk factors. I discussed with him the natural history of intermittent claudication and strongly advised him to quit smoking in order to decrease the chance of progression to critical limb ischemia.  2.  Essential hypertension: Blood pressures controlled on current medications.  3.  Hyperlipidemia: Currently on atorvastatin and Zetia.  Goal LDL is less than 70.  4.   Coronary artery disease involving native coronary arteries without angina: Continue medical therapy.  5.  Tobacco use: I strongly advised him to quit smoking.    Disposition:   FU with me in 1 year  Signed,  Kathlyn Sacramento, MD  09/11/2017 9:45 AM    [place]

## 2017-09-13 ENCOUNTER — Other Ambulatory Visit: Payer: Self-pay | Admitting: *Deleted

## 2017-09-13 DIAGNOSIS — Z79899 Other long term (current) drug therapy: Secondary | ICD-10-CM

## 2017-09-13 DIAGNOSIS — I10 Essential (primary) hypertension: Secondary | ICD-10-CM

## 2017-09-13 DIAGNOSIS — R748 Abnormal levels of other serum enzymes: Secondary | ICD-10-CM

## 2017-09-13 MED ORDER — LISINOPRIL 5 MG PO TABS: 2.5000 mg | ORAL_TABLET | Freq: Every day | ORAL | 1 refills | Status: DC

## 2017-09-14 ENCOUNTER — Other Ambulatory Visit (HOSPITAL_COMMUNITY): Payer: Self-pay | Admitting: *Deleted

## 2017-09-14 DIAGNOSIS — C180 Malignant neoplasm of cecum: Secondary | ICD-10-CM

## 2017-09-14 DIAGNOSIS — C19 Malignant neoplasm of rectosigmoid junction: Secondary | ICD-10-CM

## 2017-09-17 ENCOUNTER — Encounter (HOSPITAL_COMMUNITY)
Admission: RE | Admit: 2017-09-17 | Discharge: 2017-09-17 | Disposition: A | Discharge status: Home / Self Care | Payer: Medicare HMO | Source: Ambulatory Visit | Attending: Hematology | Admitting: Hematology

## 2017-09-17 ENCOUNTER — Inpatient Hospital Stay (HOSPITAL_COMMUNITY): Payer: Medicare HMO | Attending: Hematology

## 2017-09-17 DIAGNOSIS — F17210 Nicotine dependence, cigarettes, uncomplicated: Secondary | ICD-10-CM | POA: Diagnosis not present

## 2017-09-17 DIAGNOSIS — Z9221 Personal history of antineoplastic chemotherapy: Secondary | ICD-10-CM | POA: Insufficient documentation

## 2017-09-17 DIAGNOSIS — C19 Malignant neoplasm of rectosigmoid junction: Secondary | ICD-10-CM | POA: Insufficient documentation

## 2017-09-17 DIAGNOSIS — C180 Malignant neoplasm of cecum: Secondary | ICD-10-CM | POA: Insufficient documentation

## 2017-09-17 DIAGNOSIS — Z800 Family history of malignant neoplasm of digestive organs: Secondary | ICD-10-CM | POA: Diagnosis not present

## 2017-09-17 DIAGNOSIS — Z7982 Long term (current) use of aspirin: Secondary | ICD-10-CM | POA: Insufficient documentation

## 2017-09-17 DIAGNOSIS — G620 Drug-induced polyneuropathy: Secondary | ICD-10-CM | POA: Diagnosis not present

## 2017-09-17 DIAGNOSIS — C169 Malignant neoplasm of stomach, unspecified: Secondary | ICD-10-CM | POA: Diagnosis not present

## 2017-09-17 DIAGNOSIS — R197 Diarrhea, unspecified: Secondary | ICD-10-CM | POA: Diagnosis not present

## 2017-09-17 DIAGNOSIS — N182 Chronic kidney disease, stage 2 (mild): Secondary | ICD-10-CM | POA: Diagnosis not present

## 2017-09-17 DIAGNOSIS — Z79899 Other long term (current) drug therapy: Secondary | ICD-10-CM | POA: Insufficient documentation

## 2017-09-17 DIAGNOSIS — C163 Malignant neoplasm of pyloric antrum: Secondary | ICD-10-CM | POA: Diagnosis not present

## 2017-09-17 LAB — COMPREHENSIVE METABOLIC PANEL
ALBUMIN: 3.7 g/dL (ref 3.5–5.0)
ALK PHOS: 106 U/L (ref 38–126)
ALT: 13 U/L (ref 0–44)
AST: 17 U/L (ref 15–41)
Anion gap: 6 (ref 5–15)
BILIRUBIN TOTAL: 0.6 mg/dL (ref 0.3–1.2)
BUN: 24 mg/dL — AB (ref 8–23)
CALCIUM: 8.6 mg/dL — AB (ref 8.9–10.3)
CO2: 25 mmol/L (ref 22–32)
CREATININE: 1.52 mg/dL — AB (ref 0.61–1.24)
Chloride: 108 mmol/L (ref 98–111)
GFR calc Af Amer: 50 mL/min — ABNORMAL LOW (ref >60)
GFR calc non Af Amer: 43 mL/min — ABNORMAL LOW (ref >60)
GLUCOSE: 109 mg/dL — AB (ref 70–99)
Potassium: 4.5 mmol/L (ref 3.5–5.1)
Sodium: 139 mmol/L (ref 135–145)
TOTAL PROTEIN: 6.8 g/dL (ref 6.5–8.1)

## 2017-09-17 LAB — CBC WITH DIFFERENTIAL/PLATELET
Basophils Absolute: 0 10*3/uL (ref 0.0–0.1)
Basophils Relative: 0 %
EOS ABS: 0.4 10*3/uL (ref 0.0–0.7)
EOS PCT: 5 %
HCT: 39.5 % (ref 39.0–52.0)
Hemoglobin: 12.9 g/dL — ABNORMAL LOW (ref 13.0–17.0)
LYMPHS ABS: 2.1 10*3/uL (ref 0.7–4.0)
LYMPHS PCT: 24 %
MCH: 30.9 pg (ref 26.0–34.0)
MCHC: 32.7 g/dL (ref 30.0–36.0)
MCV: 94.5 fL (ref 78.0–100.0)
MONO ABS: 0.6 10*3/uL (ref 0.1–1.0)
MONOS PCT: 7 %
Neutro Abs: 5.7 10*3/uL (ref 1.7–7.7)
Neutrophils Relative %: 64 %
PLATELETS: 175 10*3/uL (ref 150–400)
RBC: 4.18 MIL/uL — ABNORMAL LOW (ref 4.22–5.81)
RDW: 13.8 % (ref 11.5–15.5)
WBC: 8.9 10*3/uL (ref 4.0–10.5)

## 2017-09-17 MED ORDER — FLUDEOXYGLUCOSE F - 18 (FDG) INJECTION
9.8000 | Freq: Once | INTRAVENOUS | Status: AC | PRN
  Administered 2017-09-17: 9.8 via INTRAVENOUS

## 2017-09-18 LAB — CEA: CEA1: 11.3 ng/mL — AB (ref 0.0–4.7)

## 2017-09-19 ENCOUNTER — Other Ambulatory Visit (HOSPITAL_COMMUNITY): Payer: Medicare HMO

## 2017-09-26 ENCOUNTER — Encounter (HOSPITAL_COMMUNITY): Payer: Self-pay | Admitting: Hematology

## 2017-09-26 ENCOUNTER — Encounter (HOSPITAL_COMMUNITY): Payer: Medicare HMO

## 2017-09-26 ENCOUNTER — Inpatient Hospital Stay (HOSPITAL_BASED_OUTPATIENT_CLINIC_OR_DEPARTMENT_OTHER): Payer: Medicare HMO | Admitting: Hematology

## 2017-09-26 ENCOUNTER — Other Ambulatory Visit: Payer: Self-pay

## 2017-09-26 VITALS — BP 122/56 | HR 55 | Temp 97.8°F | Resp 18 | Wt 164.1 lb

## 2017-09-26 DIAGNOSIS — F17210 Nicotine dependence, cigarettes, uncomplicated: Secondary | ICD-10-CM

## 2017-09-26 DIAGNOSIS — N182 Chronic kidney disease, stage 2 (mild): Secondary | ICD-10-CM

## 2017-09-26 DIAGNOSIS — Z7982 Long term (current) use of aspirin: Secondary | ICD-10-CM

## 2017-09-26 DIAGNOSIS — Z800 Family history of malignant neoplasm of digestive organs: Secondary | ICD-10-CM

## 2017-09-26 DIAGNOSIS — C19 Malignant neoplasm of rectosigmoid junction: Secondary | ICD-10-CM

## 2017-09-26 DIAGNOSIS — R197 Diarrhea, unspecified: Secondary | ICD-10-CM

## 2017-09-26 DIAGNOSIS — C169 Malignant neoplasm of stomach, unspecified: Secondary | ICD-10-CM

## 2017-09-26 DIAGNOSIS — C180 Malignant neoplasm of cecum: Secondary | ICD-10-CM

## 2017-09-26 DIAGNOSIS — Z79899 Other long term (current) drug therapy: Secondary | ICD-10-CM

## 2017-09-26 DIAGNOSIS — Z9221 Personal history of antineoplastic chemotherapy: Secondary | ICD-10-CM

## 2017-09-26 DIAGNOSIS — G620 Drug-induced polyneuropathy: Secondary | ICD-10-CM | POA: Diagnosis not present

## 2017-09-26 NOTE — Assessment & Plan Note (Addendum)
1.  Stage III (T2N3A) gastric adenocarcinoma: This was resected in June 2017.  Patient did receive 2 cycles of adjuvant FOLFOX, discontinued it after left foot drop. -I discussed the results of the PET CT scan dated 09/17/2017 which showed adenopathy in the left supraclavicular region, bilateral lung nodules, 2 liver nodules in the right lobe of the liver, mesenteric adenopathy.  I have recommended biopsying 1 of these lesions to see if this is coming from gastric primary or colon cancer primary.  He is agreeable to this option. -We will set up biopsy by interventional radiology.  We will see him back after the biopsy.  He wants to have the biopsy set up in 3 weeks from now.  2.  Stage IIIb (T3N1B) cecal adenocarcinoma: This was resected at the same time of gastric cancer on 08/02/2015.  MMR was normal.  Patient refused adjuvant chemotherapy after 2 cycles of FOLFOX secondary to neuropathy and left foot drop.  His most recent CEA is 11.2.  I have reviewed the PET/CT findings with the patient.  3.  History of iron deficiency state: He was told to take 1 iron tablet daily.  Last Feraheme infusion was on 04/05/2017.  He also has mild CKD.  4.  Peripheral neuropathy: He does not report any tingling or numbness.  But his feet feel hot at times mostly at nights.  He is able to do all activities.  5.  Stage II rectosigmoid cancer: This was also resected at the same time as gastric cancer and right-sided colon cancer.  It was staged as T3N0.  6.  Family history: His father had cancer in the stomach area.  Sister died of colon cancer.  Another sister had stomach cancer.  I have strongly recommended genetic testing.  He declined.

## 2017-09-26 NOTE — Progress Notes (Signed)
[address], [place] [address]   CLINIC:  Medical Oncology/Hematology  PCP:  Dettinger, Fransisca Kaufmann, MD [place] [id number] [phone]   REASON FOR VISIT:  Follow-up for stage III gastric adenocarcinoma, stage III cecal adenocarcinoma, stage II rectosigmoid carcinoma  CURRENT THERAPY: work up  BRIEF ONCOLOGIC HISTORY:    Gastric adenocarcinoma ([place])   06/17/2015 Procedure    Stomach biopsy of pyloric lesion, Dr. 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      06/28/2015 PET scan    Intensely hypermetabolic cecal mass. No definitive evidence of metastatic disease. Scattered upper lobe predominant pulmonary nodules are too small for PET resolution.       08/02/2015 Procedure    Antral resection of tumor of stomach by Dr. Stark Klein      08/06/2015 Pathology Results    Invasive adenocarcinoma, poorly differentiated, 3.0 cm, extending into the muscularis propria, LVI +, 8/16 positive lymph nodes for metastatic disease, negative margins.      08/06/2015 Cancer Staging    pT2N3A      09/15/2015 Procedure    Port placed by IR      09/20/2015 - 10/18/2015 Chemotherapy    FOLFOX x 2 cycles      09/29/2015 Treatment Plan Change    5 FU bolus is discontinued      10/07/2015 Survivorship    Genetic Counseling consultation with [name]- Despite our recommendation, Mr. Gertner did not wish to pursue genetic testing at today's visit. We understand this decision, and remain available to coordinate genetic testing at any time in the future. We; therefore, recommend Mr. Prevette continue to follow the cancer screening guidelines given by his primary healthcare provider.      10/18/2015 Treatment Plan Change    Patient refuses future chemotherapy treatment      09/15/2016 Imaging   CT chest w/o contrast: IMPRESSION: 1. While several of the previously noted new pulmonary nodules have resolved compared to the prior study, indicative of benign nodules, there are 2 enlarging nodules. One of these in the left lower lobe has increased from 5 mm to 8 mm in size. The largest nodule (visible in retrospect on the prior study) has also increased in size and currently measures 10 x 8 x 13 mm in the medial aspect of the right middle lobe (axial image 96 of series 4). These findings are suspicious for metastatic disease, although either of these lesions could alternatively represent a primary bronchogenic malignancy given the background of smoking related changes in the lungs. Further evaluation with PET-CT is recommended at this time. 2. Mild diffuse bronchial thickening with mild to moderate centrilobular and paraseptal emphysema; imaging findings suggestive of underlying COPD. 3. Aortic atherosclerosis, in addition to left main and 3 vessel coronary artery disease. Assessment for potential risk factor modification, dietary therapy or pharmacologic therapy may be warranted, if clinically indicated. 4. Additional incidental findings, as above.       Adenocarcinoma of cecum ([place])   06/10/2015 Procedure    Colonoscopy with Dr. Gala Romney, Cecal neoplasm. large polyp at hepatic flexure. lesion tattooed, large polyp in mid sigmoid (lesion not completely removed)      06/10/2015 Pathology Results    Cecal mass - adenocarcinoma, polyp at hepatic flexure foci of adenocarcinoma arising from tubulovillous adenoma  06/28/2015 PET scan    Intensely hypermetabolic cecal mass. No definitive evidence of metastatic disease. Scattered upper lobe predominant pulmonary nodules are too small for PET resolution.       08/02/2015 Procedure    Segmental resection of tumor, right, transverse, descending colon by Dr. Stark Klein      08/06/2015 Pathology Results    Invasive adenocarcinoma, 4.3  cm, well-differentiated, extending through the muscularis propria into the pericolonic soft tissue. 2/14 lymph nodes positive for metastatic disease. Surgical margins are negative.      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 Gala Romney, Cecal neoplasm. large polyp at hepatic flexure. lesion tattooed, large polyp in mid sigmoid (lesion not completely removed)      06/10/2015 Pathology Results    Polyp sigmoid - tubulovillous adenoma with at least high grade dysplasia cannot rule out focal adenocarcinoma      06/28/2015 PET scan    Intensely hypermetabolic cecal mass. No definitive evidence of metastatic disease. Scattered upper lobe predominant pulmonary nodules are too small for PET resolution.       08/02/2015 Procedure    Segmental resection for tumor of rectosigmoid colon by Dr. Stark Klein.      08/06/2015 Pathology Results    Invasive adenocarcinoma, 2.6 cm, well-differentiated, extending through the muscularis propria and into pericolonic soft tissue, 0/10 nodes involved, negative resection margins. No LVI.      08/06/2015 Cancer Staging    pT3N0        CANCER STAGING: Cancer Staging Adenocarcinoma of cecum [facility]) Staging form: Colon and Rectum, AJCC 7th Edition - Pathologic stage from 08/06/2015: Stage IIIB (T3, N1b, cM0) - Signed by Baird Cancer, PA-C on 08/20/2015  Adenocarcinoma of rectosigmoid junction [facility]) Staging form: Colon and Rectum, AJCC 7th Edition - Pathologic stage from 08/06/2015: Stage IIA (T3, N0, cM0) - Signed by Baird Cancer, PA-C on 08/20/2015  Gastric adenocarcinoma [facility]) Staging form: Stomach, AJCC 7th Edition - Pathologic stage from 08/06/2015: Stage IIIA (T2, N3a, cM0) - Signed by Baird Cancer, PA-C on 08/20/2015    INTERVAL HISTORY:  Mr. Dinino 72 y.o. male returns for routine follow-up rectosigmoid, gastric and cecal cancer. Patient is here today  with his wife. Patient states his energy levels have been good he works non stop. He works 7 days a week. His appetite remains good at 100%. He denies any new pains. Denies any nausea or vomiting. Denies any fevers, chills, or infections recently.    REVIEW OF SYSTEMS:  Review of Systems  Constitutional: Negative.   HENT:  Negative.   Eyes: Negative.   Respiratory: Negative.   Cardiovascular: Negative.   Gastrointestinal: Positive for diarrhea.  Endocrine: Negative.   Genitourinary: Negative.    Musculoskeletal: Negative.   Skin: Negative.   Neurological: Negative.   Hematological: Bruises/bleeds easily.  Psychiatric/Behavioral: Negative.      PAST MEDICAL/SURGICAL HISTORY:  Past Medical History:  Diagnosis Date  . Adenocarcinoma of cecum ([place]) 07/01/2015  . 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 CAD (coronary artery disease) RCA non dominant vessel, LAD 40%, 80-90% 2nd diag EF 55%  01/19/2013   Dr. Claiborne Billings [place] 03-05-15 Epic.  . Family history of colon cancer   . Family history of stomach cancer   . Gastric adenocarcinoma ([place]) 06/29/2015  . Gastric ulcer    many yrs ago  . GERD (gastroesophageal reflux disease)   .  Hyperlipidemia LDL goal < 70 01/19/2013  . Hypertension   . Hypokalemia 01/20/2013  . Metabolic syndrome, with mildly elevated HgBA1C 01/20/2013  . Pulmonary nodules 01/19/2013  . STEMI (ST elevation myocardial infarction), 01/19/13 01/19/2013  . Tobacco abuse 01/20/2013  . Transfusion history    with gastric ulcer- many yrs ago   Past Surgical History:  Procedure Laterality Date  . APPENDECTOMY    . BIOPSY  10/05/2016   Procedure: BIOPSY;  Surgeon: Daneil Dolin, MD;  Location: [facility];  Service: Endoscopy;;  sigmoid anastomosis  . CATARACT EXTRACTION, BILATERAL    . COLONOSCOPY N/A 06/10/2015   RMR: cecal neoplasm  ? biopsied. large polyp at the hepatic flexure removed with piecmeal polypectomy and APC ablation. lesion tattooed. Large  polyp in the mid sigmoid status post piecemeal hot snare debulking and tattooing. this lesion not completely removed. scatterd pancolonic diverticulsosis. no speciments collected.   . ESOPHAGOGASTRODUODENOSCOPY N/A 06/10/2015   Procedure: ESOPHAGOGASTRODUODENOSCOPY (EGD);  Surgeon: Daneil Dolin, MD;  Location: [facility];  Service: Endoscopy;  Laterality: N/A;  . ESOPHAGOGASTRODUODENOSCOPY N/A 06/17/2015   RMR: normal esophagus small hiatal hernia. Abnormal nodular antrum .pyloric channel status post biopsy.   Marland Kitchen FLEXIBLE SIGMOIDOSCOPY N/A 10/05/2016   Procedure: FLEXIBLE SIGMOIDOSCOPY;  Surgeon: Daneil Dolin, MD;  Location: [facility];  Service: Endoscopy;  Laterality: N/A;  130   . HERNIA REPAIR Right   . LAPAROSCOPIC SUBTOTAL COLECTOMY N/A 08/02/2015   Procedure: LAPAROSCOPIC SUBTOTAL COLECTOMY  AND DISTAL GASTRECTOMY ;  Surgeon: Stark Klein, MD;  Location: [facility];  Service: General;  Laterality: N/A;  . LEFT HEART CATH N/A 01/19/2013   Procedure: LEFT HEART CATH;  Surgeon: Troy Sine, MD;  Location: [facility] CATH LAB;  Service: Cardiovascular;  Laterality: N/A;     SOCIAL HISTORY:  Social History   Socioeconomic History  . Marital status: Widowed    Spouse name: Not on file  . Number of children: 0  . Years of education: Not on file  . Highest education level: Not on file  Occupational History  . Occupation: farmer  Social Needs  . Financial resource strain: Not on file  . Food insecurity:    Worry: Not on file    Inability: Not on file  . Transportation needs:    Medical: Not on file    Non-medical: Not on file  Tobacco Use  . Smoking status: Current Every Day Smoker    Packs/day: 1.00    Years: 58.00    Pack years: 58.00    Types: Cigarettes    Start date: 01/21/1963  . Smokeless tobacco: Never Used  . Tobacco comment: one pack daily  Substance and Sexual Activity  . Alcohol use: No    Alcohol/week: 0.0 oz    Comment: none in 15 yrs -heavy user-none now.  .  Drug use: No  . Sexual activity: Not on file  Lifestyle  . Physical activity:    Days per week: Not on file    Minutes per session: Not on file  . Stress: Not on file  Relationships  . Social connections:    Talks on phone: Not on file    Gets together: Not on file    Attends religious service: Not on file    Active member of club or organization: Not on file    Attends meetings of clubs or organizations: Not on file    Relationship status: Not on file  . Intimate partner violence:  Fear of current or ex partner: Not on file    Emotionally abused: Not on file    Physically abused: Not on file    Forced sexual activity: Not on file  Other Topics Concern  . Not on file  Social History Narrative  . Not on file    FAMILY HISTORY:  Family History  Problem Relation Age of Onset  . Heart attack Mother   . Colon cancer Father        dx in his 34s  . Colon cancer Sister        dx possibly in her early 2s  . Stomach cancer Sister        dx in her 90s  . Colon cancer Paternal Uncle     CURRENT MEDICATIONS:  Outpatient Encounter Medications as of 09/26/2017  Medication Sig  . acetaminophen (TYLENOL) 325 MG tablet Take 650 mg by mouth daily as needed for moderate pain or headache.  Marland Kitchen aspirin 81 MG EC tablet Take 81 mg by mouth daily.  Marland Kitchen atorvastatin (LIPITOR) 40 MG tablet TAKE ONE TABLET BY MOUTH EVERY DAY  . clopidogrel (PLAVIX) 75 MG tablet TAKE ONE TABLET BY MOUTH DAILY WITH BREAKFAST  . diphenoxylate-atropine (LOMOTIL) 2.5-0.025 MG tablet TAKE 1 TO 2 TABLETS 4 TIMES DAILY AS NEEDED FOR DIARRHEA  . ezetimibe (ZETIA) 10 MG tablet Take 1 tablet (10 mg total) by mouth daily.  Marland Kitchen lidocaine-prilocaine (EMLA) cream APPLY A QUARTER SIZE AMOUNT TO PORT SITE 1 HOUR PRIOR TO CHEMO. DO NOT RUB IN. COVER WITH PLASTIC WRAP.  Marland Kitchen lisinopril (PRINIVIL,ZESTRIL) 5 MG tablet Take 0.5 tablets (2.5 mg total) by mouth daily.  . ondansetron (ZOFRAN) 8 MG tablet Take 1 tablet (8 mg total) by mouth  every 8 (eight) hours as needed for nausea or vomiting.  . pantoprazole (PROTONIX) 40 MG tablet TAKE ONE TABLET BY MOUTH DAILY AT 6:00 A.M.  . prochlorperazine (COMPAZINE) 10 MG tablet Take 1 tablet (10 mg total) by mouth every 6 (six) hours as needed for nausea or vomiting.   No facility-administered encounter medications on file as of 09/26/2017.     ALLERGIES:  Allergies  Allergen Reactions  . Contrast Media [Iodinated Diagnostic Agents] Itching and Rash     PHYSICAL EXAM:  ECOG Performance status: 0  Vitals:   09/26/17 1404  BP: (!) 122/56  Pulse: (!) 55  Resp: 18  Temp: 97.8 F (36.6 C)  SpO2: 99%   Filed Weights   09/26/17 1404  Weight: 164 lb 1.6 oz (74.4 kg)    Physical Exam   LABORATORY DATA:  I have reviewed the labs as listed.  CBC    Component Value Date/Time   WBC 8.9 09/17/2017 1458   RBC 4.18 (L) 09/17/2017 1458   HGB 12.9 (L) 09/17/2017 1458   HGB 13.0 01/05/2017 0842   HCT 39.5 09/17/2017 1458   HCT 40.5 01/05/2017 0842   PLT 175 09/17/2017 1458   PLT 233 01/05/2017 0842   MCV 94.5 09/17/2017 1458   MCV 91 01/05/2017 0842   MCH 30.9 09/17/2017 1458   MCHC 32.7 09/17/2017 1458   RDW 13.8 09/17/2017 1458   RDW 14.1 01/05/2017 0842   LYMPHSABS 2.1 09/17/2017 1458   LYMPHSABS 2.0 05/04/2015 1410   MONOABS 0.6 09/17/2017 1458   EOSABS 0.4 09/17/2017 1458   EOSABS 0.5 (H) 05/04/2015 1410   BASOSABS 0.0 09/17/2017 1458   BASOSABS 0.0 05/04/2015 1410   CMP Latest Ref Rng & Units 09/17/2017 09/11/2017 03/29/2017  Glucose 70 - 99 mg/dL 109(H) 106(H) 84  BUN 8 - 23 mg/dL 24(H) 19 14  Creatinine 0.61 - 1.24 mg/dL 1.52(H) 1.53(H) 1.10  Sodium 135 - 145 mmol/L 139 143 140  Potassium 3.5 - 5.1 mmol/L 4.5 4.7 4.1  Chloride 98 - 111 mmol/L 108 106 107  CO2 22 - 32 mmol/L 25 23 23   Calcium 8.9 - 10.3 mg/dL 8.6(L) 8.9 8.8(L)  Total Protein 6.5 - 8.1 g/dL 6.8 6.3 6.7  Total Bilirubin 0.3 - 1.2 mg/dL 0.6 0.6 0.5  Alkaline Phos 38 - 126 U/L 106 131(H)  120  AST 15 - 41 U/L 17 15 15   ALT 0 - 44 U/L 13 11 11(L)       DIAGNOSTIC IMAGING:  I have reviewed images of PET CT scan dated 09/17/2017 and discussed with the patient in detail.     ASSESSMENT & PLAN:   Gastric adenocarcinoma ([place]) 1.  Stage III (T2N3A) gastric adenocarcinoma: This was resected in June 2017.  Patient did receive 2 cycles of adjuvant FOLFOX, discontinued it after left foot drop. -I discussed the results of the PET CT scan dated 09/17/2017 which showed adenopathy in the left supraclavicular region, bilateral lung nodules, 2 liver nodules in the right lobe of the liver, mesenteric adenopathy.  I have recommended biopsying 1 of these lesions to see if this is coming from gastric primary or colon cancer primary.  He is agreeable to this option. -We will set up biopsy by interventional radiology.  We will see him back after the biopsy.  He wants to have the biopsy set up in 3 weeks from now.  2.  Stage IIIb (T3N1B) cecal adenocarcinoma: This was resected at the same time of gastric cancer on 08/02/2015.  MMR was normal.  Patient refused adjuvant chemotherapy after 2 cycles of FOLFOX secondary to neuropathy and left foot drop.  His most recent CEA is 11.2.  I have reviewed the PET/CT findings with the patient.  3.  History of iron deficiency state: He was told to take 1 iron tablet daily.  Last Feraheme infusion was on 04/05/2017.  He also has mild CKD.  4.  Peripheral neuropathy: He does not report any tingling or numbness.  But his feet feel hot at times mostly at nights.  He is able to do all activities.  5.  Stage II rectosigmoid cancer: This was also resected at the same time as gastric cancer and right-sided colon cancer.  It was staged as T3N0.  6.  Family history: His father had cancer in the stomach area.  Sister died of colon cancer.  Another sister had stomach cancer.  I have strongly recommended genetic testing.  He declined.      Orders placed this encounter:    Orders Placed This Encounter  Procedures  . US Guided Needle Placement      Derek Jack, [place] [phone]

## 2017-09-27 NOTE — Addendum Note (Signed)
Addended by: Glennie Isle on: 09/27/2017 10:10 AM   Modules accepted: Orders

## 2017-10-01 ENCOUNTER — Other Ambulatory Visit (HOSPITAL_COMMUNITY): Payer: Medicare HMO

## 2017-10-04 ENCOUNTER — Other Ambulatory Visit: Payer: Self-pay | Admitting: Cardiovascular Disease

## 2017-10-04 NOTE — Telephone Encounter (Signed)
Rx sent to pharmacy   

## 2017-10-08 ENCOUNTER — Ambulatory Visit (INDEPENDENT_AMBULATORY_CARE_PROVIDER_SITE_OTHER): Payer: Medicare HMO | Admitting: Gastroenterology

## 2017-10-08 ENCOUNTER — Encounter (HOSPITAL_COMMUNITY): Payer: Self-pay | Admitting: Hematology

## 2017-10-08 ENCOUNTER — Encounter: Payer: Self-pay | Admitting: Gastroenterology

## 2017-10-08 DIAGNOSIS — C180 Malignant neoplasm of cecum: Secondary | ICD-10-CM | POA: Diagnosis not present

## 2017-10-08 DIAGNOSIS — C169 Malignant neoplasm of stomach, unspecified: Secondary | ICD-10-CM

## 2017-10-08 DIAGNOSIS — C19 Malignant neoplasm of rectosigmoid junction: Secondary | ICD-10-CM

## 2017-10-08 MED ORDER — DIPHENOXYLATE-ATROPINE 2.5-0.025 MG PO TABS: ORAL_TABLET | ORAL | 3 refills | Status: AC

## 2017-10-08 NOTE — Patient Instructions (Signed)
I have refilled Lomotil for you.  I will make sure we are up-to-date on our end for any further surveillance.  Further recommendations to follow shortly!  It was a pleasure to see you today. I strive to create trusting relationships with patients to provide genuine, compassionate, and quality care. I value your feedback. If you receive a survey regarding your visit,  I greatly appreciate you taking time to fill this out.   Annitta Needs, PhD, ANP-BC [facility]

## 2017-10-08 NOTE — Progress Notes (Signed)
Referring Provider: Dettinger, Fransisca Kaufmann, MD Primary Care Physician:  Dettinger, Fransisca Kaufmann, MD  Primary GI: Dr. Gala Romney   Chief Complaint  Patient presents with  . colon cancer    f/u, doing ok    HPI:   Steven Drake is a 72 y.o. male presenting today with a history of stage III gastric adenocarcinoma, stage III cecal adenocarcinoma, stage II rectosigmoid carcinoma. Underwent laparoscopic subtotal colectomy and distal gastrectomy by Dr. Barry Dienes in June 2017. Flex sig in Aug 2018 on file with inflammation at anastomosis.   PET scan July 2019 with adenopathy in the left supraclavicular region, bilateral lung nodules, 2 liver nodules in the right lobe of the liver, mesenteric adenopathy. Oncology recommending biopsying 1 of these lesions to see if this is coming from gastric primary or colon cancer primary. He has now requested transfer to Dr. Tressie Stalker   He desires to transfer care to Dr. Tressie Stalker. He is declining biopsies at this time. Still working. No abdominal pain. Frequent loose stools at baseline after colectomy. No rectal bleeding.       Past Medical History:  Diagnosis Date  . Adenocarcinoma of cecum ([place]) 07/01/2015  . 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 CAD (coronary artery disease) RCA non dominant vessel, LAD 40%, 80-90% 2nd diag EF 55%  01/19/2013   Dr. Claiborne Billings [place] 03-05-15 Epic.  . Family history of colon cancer   . Family history of stomach cancer   . Gastric adenocarcinoma ([place]) 06/29/2015  . Gastric ulcer    many yrs ago  . GERD (gastroesophageal reflux disease)   . Hyperlipidemia LDL goal < 70 01/19/2013  . Hypertension   . Hypokalemia 01/20/2013  . Metabolic syndrome, with mildly elevated HgBA1C 01/20/2013  . Pulmonary nodules 01/19/2013  . STEMI (ST elevation myocardial infarction), 01/19/13 01/19/2013  . Tobacco abuse 01/20/2013  . Transfusion history    with gastric ulcer- many yrs ago    Past Surgical History:  Procedure  Laterality Date  . APPENDECTOMY    . BIOPSY  10/05/2016   Procedure: BIOPSY;  Surgeon: Daneil Dolin, MD;  [facility];  Service: Endoscopy;;  sigmoid anastomosis  . CATARACT EXTRACTION, BILATERAL    . COLONOSCOPY N/A 06/10/2015   RMR: cecal neoplasm  ? biopsied. large polyp at the hepatic flexure removed with piecmeal polypectomy and APC ablation. lesion tattooed. Large polyp in the mid sigmoid status post piecemeal hot snare debulking and tattooing. this lesion not completely removed. scatterd pancolonic diverticulsosis.   Marland Kitchen ESOPHAGOGASTRODUODENOSCOPY N/A 06/10/2015   Procedure: ESOPHAGOGASTRODUODENOSCOPY (EGD);  Surgeon: Daneil Dolin, MD;  Location[facility];  Service: Endoscopy;  Laterality: N/A;  . ESOPHAGOGASTRODUODENOSCOPY N/A 06/17/2015   RMR: normal esophagus small hiatal hernia. Abnormal nodular antrum .pyloric channel status post biopsy.   Marland Kitchen FLEXIBLE SIGMOIDOSCOPY N/A 10/05/2016   Dr. Gala Romney: stenotic sigmoid ileal anastomosis with abnormal appearing mucosa with appearance like scar tissue, s/p biopsy. biopsies with colonic mucosa and patchy ulceration, acute and chronic inflammation.   Marland Kitchen HERNIA REPAIR Right   . LAPAROSCOPIC SUBTOTAL COLECTOMY N/A 08/02/2015   Procedure: LAPAROSCOPIC SUBTOTAL COLECTOMY  AND DISTAL GASTRECTOMY ;  Surgeon: Stark Klein, MD;  Location: [facility]S;  Service: General;  Laterality: N/A;  . LEFT HEART CATH N/A 01/19/2013   Procedure: LEFT HEART CATH;  Surgeon: Troy Sine, MD;  Location: [facility] CATH LAB;  Service: Cardiovascular;  Laterality: N/A;    Current Outpatient Medications  Medication Sig Dispense Refill  .  acetaminophen (TYLENOL) 325 MG tablet Take 650 mg by mouth daily as needed for moderate pain or headache.    Marland Kitchen aspirin 81 MG EC tablet Take 81 mg by mouth daily.  12  . atorvastatin (LIPITOR) 40 MG tablet TAKE ONE TABLET BY MOUTH EVERY DAY 90 tablet 2  . clopidogrel (PLAVIX) 75 MG tablet TAKE 1 TABLET BY MOUTH DAILY WITH BREAKFAST 30  tablet 2  . diphenoxylate-atropine (LOMOTIL) 2.5-0.025 MG tablet TAKE 1 TO 2 TABLETS 4 TIMES DAILY AS NEEDED FOR DIARRHEA 60 tablet 3  . ezetimibe (ZETIA) 10 MG tablet Take 1 tablet (10 mg total) by mouth daily. 90 tablet 3  . lidocaine-prilocaine (EMLA) cream APPLY A QUARTER SIZE AMOUNT TO PORT SITE 1 HOUR PRIOR TO CHEMO. DO NOT RUB IN. COVER WITH PLASTIC WRAP.  3  . lisinopril (PRINIVIL,ZESTRIL) 5 MG tablet Take 0.5 tablets (2.5 mg total) by mouth daily. 90 tablet 1  . ondansetron (ZOFRAN) 8 MG tablet Take 1 tablet (8 mg total) by mouth every 8 (eight) hours as needed for nausea or vomiting. 30 tablet 2  . pantoprazole (PROTONIX) 40 MG tablet TAKE ONE TABLET BY MOUTH DAILY AT 6:00 A.M. 30 tablet 2  . prochlorperazine (COMPAZINE) 10 MG tablet Take 1 tablet (10 mg total) by mouth every 6 (six) hours as needed for nausea or vomiting. 30 tablet 2   No current facility-administered medications for this visit.     Allergies as of 10/08/2017 - Review Complete 10/08/2017  Allergen Reaction Noted  . Contrast media [iodinated diagnostic agents] Itching and Rash 05/20/2015    Family History  Problem Relation Age of Onset  . Heart attack Mother   . Colon cancer Father        dx in his 59s  . Colon cancer Sister        dx possibly in her early 48s  . Stomach cancer Sister        dx in her 40s  . Colon cancer Paternal Uncle     Social History   Socioeconomic History  . Marital status: Widowed    Spouse name: Not on file  . Number of children: 0  . Years of education: Not on file  . Highest education level: Not on file  Occupational History  . Occupation: farmer  Social Needs  . Financial resource strain: Not on file  . Food insecurity:    Worry: Not on file    Inability: Not on file  . Transportation needs:    Medical: Not on file    Non-medical: Not on file  Tobacco Use  . Smoking status: Current Every Day Smoker    Packs/day: 1.00    Years: 58.00    Pack years: 58.00     Types: Cigarettes    Start date: 01/21/1963  . Smokeless tobacco: Never Used  . Tobacco comment: one pack daily  Substance and Sexual Activity  . Alcohol use: No    Alcohol/week: 0.0 standard drinks    Comment: none in 15 yrs -heavy user-none now.  . Drug use: No  . Sexual activity: Not on file  Lifestyle  . Physical activity:    Days per week: Not on file    Minutes per session: Not on file  . Stress: Not on file  Relationships  . Social connections:    Talks on phone: Not on file    Gets together: Not on file    Attends religious service: Not on file    Active  member of club or organization: Not on file    Attends meetings of clubs or organizations: Not on file    Relationship status: Not on file  Other Topics Concern  . Not on file  Social History Narrative  . Not on file    Review of Systems: Gen: Denies fever, chills, anorexia. Denies fatigue, weakness, weight loss.  CV: Denies chest pain, palpitations, syncope, peripheral edema, and claudication. Resp: Denies dyspnea at rest, cough, wheezing, coughing up blood, and pleurisy. GI: see HPI  Derm: Denies rash, itching, dry skin Psych: Denies depression, anxiety, memory loss, confusion. No homicidal or suicidal ideation.  Heme: Denies bruising, bleeding, and enlarged lymph nodes.  Physical Exam: BP 111/63   Pulse (!) 56   Temp 97.8 F (36.6 C) (Oral)   Ht 5\' 11"  (1.803 m)   Wt 161 lb 6.4 oz (73.2 kg)   BMI 22.51 kg/m  General:   Alert and oriented. No distress noted. Pleasant and cooperative.  Head:  Normocephalic and atraumatic. Eyes:  Conjuctiva clear without scleral icterus. Mouth:  Oral mucosa pink and moist.  Abdomen:  +BS, soft, non-tender and non-distended. No rebound or guarding. No HSM or masses noted. Msk:  Symmetrical without gross deformities. Normal posture. Extremities:  Without edema. Neurologic:  Alert and  oriented x4 Psych:  Alert and cooperative. Normal mood and affect.

## 2017-10-08 NOTE — Progress Notes (Signed)
Patient requested to cancel all appointments at [facility] and cancel scheduled biopsy, ordered by Dr. Raliegh Ip.  Patient also requested a referral to Dr. Ivin Poot at [facility].  Referral faxed to [phone].

## 2017-10-16 ENCOUNTER — Other Ambulatory Visit (HOSPITAL_COMMUNITY): Payer: Self-pay | Admitting: Oncology

## 2017-10-16 ENCOUNTER — Encounter: Payer: Self-pay | Admitting: Gastroenterology

## 2017-10-16 DIAGNOSIS — C7800 Secondary malignant neoplasm of unspecified lung: Secondary | ICD-10-CM

## 2017-10-16 DIAGNOSIS — Z95828 Presence of other vascular implants and grafts: Secondary | ICD-10-CM | POA: Diagnosis not present

## 2017-10-16 DIAGNOSIS — C787 Secondary malignant neoplasm of liver and intrahepatic bile duct: Secondary | ICD-10-CM

## 2017-10-16 NOTE — Assessment & Plan Note (Addendum)
Stage IIIB invasive adenocarcinoma of cecum s/p resection by Dr Barry Dienes in 2017. Flex sig completed Aug 2018.

## 2017-10-16 NOTE — Assessment & Plan Note (Signed)
Stage IIA invasive adenocarcinoma of rectosigmoid, s/p resection by Dr Alessandra Bevels in 2017. Flex sig on file from Aug 2018.

## 2017-10-16 NOTE — Assessment & Plan Note (Signed)
Stage III gastric adenocarcinoma, s/p resection in 2017. Unable to complete FOLFOX due to left food drop. Recommending biopsy of lesions seen on PET Scan recently but patient desires to transfer care from [facility] to [place]. Has declined genetic testing. No alarm symptoms currently. Continue PPI and close follow-up with Oncology.

## 2017-10-16 NOTE — Progress Notes (Signed)
CC'D TO PCP °

## 2017-10-18 ENCOUNTER — Ambulatory Visit (HOSPITAL_COMMUNITY): Payer: Medicare HMO

## 2017-10-18 ENCOUNTER — Encounter (HOSPITAL_COMMUNITY): Payer: Self-pay

## 2017-10-21 ENCOUNTER — Other Ambulatory Visit: Payer: Self-pay | Admitting: Cardiovascular Disease

## 2017-10-23 ENCOUNTER — Other Ambulatory Visit: Payer: Self-pay | Admitting: Radiology

## 2017-10-24 ENCOUNTER — Ambulatory Visit (HOSPITAL_COMMUNITY)
Admission: RE | Admit: 2017-10-24 | Discharge: 2017-10-24 | Disposition: A | Discharge status: Home / Self Care | Payer: Medicare HMO | Source: Ambulatory Visit | Attending: Oncology | Admitting: Oncology

## 2017-10-24 ENCOUNTER — Encounter (HOSPITAL_COMMUNITY): Payer: Self-pay

## 2017-10-24 ENCOUNTER — Encounter (HOSPITAL_COMMUNITY): Payer: Medicare HMO

## 2017-10-24 ENCOUNTER — Ambulatory Visit (HOSPITAL_COMMUNITY): Payer: Medicare HMO | Admitting: Hematology

## 2017-10-24 ENCOUNTER — Other Ambulatory Visit: Payer: Self-pay

## 2017-10-24 DIAGNOSIS — Z79899 Other long term (current) drug therapy: Secondary | ICD-10-CM | POA: Diagnosis not present

## 2017-10-24 DIAGNOSIS — C778 Secondary and unspecified malignant neoplasm of lymph nodes of multiple regions: Secondary | ICD-10-CM | POA: Diagnosis not present

## 2017-10-24 DIAGNOSIS — C189 Malignant neoplasm of colon, unspecified: Secondary | ICD-10-CM | POA: Diagnosis not present

## 2017-10-24 DIAGNOSIS — I10 Essential (primary) hypertension: Secondary | ICD-10-CM | POA: Insufficient documentation

## 2017-10-24 DIAGNOSIS — R05 Cough: Secondary | ICD-10-CM | POA: Insufficient documentation

## 2017-10-24 DIAGNOSIS — I2510 Atherosclerotic heart disease of native coronary artery without angina pectoris: Secondary | ICD-10-CM | POA: Insufficient documentation

## 2017-10-24 DIAGNOSIS — M25512 Pain in left shoulder: Secondary | ICD-10-CM | POA: Insufficient documentation

## 2017-10-24 DIAGNOSIS — M25551 Pain in right hip: Secondary | ICD-10-CM | POA: Insufficient documentation

## 2017-10-24 DIAGNOSIS — Z85038 Personal history of other malignant neoplasm of large intestine: Secondary | ICD-10-CM | POA: Insufficient documentation

## 2017-10-24 DIAGNOSIS — C7800 Secondary malignant neoplasm of unspecified lung: Secondary | ICD-10-CM | POA: Diagnosis not present

## 2017-10-24 DIAGNOSIS — C787 Secondary malignant neoplasm of liver and intrahepatic bile duct: Secondary | ICD-10-CM | POA: Diagnosis not present

## 2017-10-24 DIAGNOSIS — I252 Old myocardial infarction: Secondary | ICD-10-CM | POA: Diagnosis not present

## 2017-10-24 DIAGNOSIS — Z800 Family history of malignant neoplasm of digestive organs: Secondary | ICD-10-CM | POA: Diagnosis not present

## 2017-10-24 DIAGNOSIS — Z720 Tobacco use: Secondary | ICD-10-CM | POA: Insufficient documentation

## 2017-10-24 DIAGNOSIS — C801 Malignant (primary) neoplasm, unspecified: Secondary | ICD-10-CM | POA: Diagnosis not present

## 2017-10-24 DIAGNOSIS — E785 Hyperlipidemia, unspecified: Secondary | ICD-10-CM | POA: Insufficient documentation

## 2017-10-24 DIAGNOSIS — Z85028 Personal history of other malignant neoplasm of stomach: Secondary | ICD-10-CM | POA: Diagnosis not present

## 2017-10-24 DIAGNOSIS — M25552 Pain in left hip: Secondary | ICD-10-CM | POA: Diagnosis not present

## 2017-10-24 DIAGNOSIS — Z7982 Long term (current) use of aspirin: Secondary | ICD-10-CM | POA: Diagnosis not present

## 2017-10-24 DIAGNOSIS — R590 Localized enlarged lymph nodes: Secondary | ICD-10-CM | POA: Insufficient documentation

## 2017-10-24 DIAGNOSIS — K219 Gastro-esophageal reflux disease without esophagitis: Secondary | ICD-10-CM | POA: Diagnosis not present

## 2017-10-24 DIAGNOSIS — C770 Secondary and unspecified malignant neoplasm of lymph nodes of head, face and neck: Secondary | ICD-10-CM | POA: Diagnosis not present

## 2017-10-24 LAB — PROTIME-INR
INR: 1.05
Prothrombin Time: 13.6 seconds (ref 11.4–15.2)

## 2017-10-24 LAB — CBC WITH DIFFERENTIAL/PLATELET
BASOS ABS: 0 10*3/uL (ref 0.0–0.1)
BASOS PCT: 0 %
Eosinophils Absolute: 0.4 10*3/uL (ref 0.0–0.7)
Eosinophils Relative: 5 %
HEMATOCRIT: 35.5 % — AB (ref 39.0–52.0)
HEMOGLOBIN: 12 g/dL — AB (ref 13.0–17.0)
Lymphocytes Relative: 22 %
Lymphs Abs: 1.8 10*3/uL (ref 0.7–4.0)
MCH: 31.7 pg (ref 26.0–34.0)
MCHC: 33.8 g/dL (ref 30.0–36.0)
MCV: 93.7 fL (ref 78.0–100.0)
MONO ABS: 0.6 10*3/uL (ref 0.1–1.0)
Monocytes Relative: 7 %
NEUTROS ABS: 5.5 10*3/uL (ref 1.7–7.7)
NEUTROS PCT: 66 %
Platelets: 194 10*3/uL (ref 150–400)
RBC: 3.79 MIL/uL — AB (ref 4.22–5.81)
RDW: 13.8 % (ref 11.5–15.5)
WBC: 8.2 10*3/uL (ref 4.0–10.5)

## 2017-10-24 LAB — BASIC METABOLIC PANEL
ANION GAP: 6 (ref 5–15)
BUN: 15 mg/dL (ref 8–23)
CALCIUM: 8.7 mg/dL — AB (ref 8.9–10.3)
CO2: 28 mmol/L (ref 22–32)
Chloride: 107 mmol/L (ref 98–111)
Creatinine, Ser: 1.14 mg/dL (ref 0.61–1.24)
GFR calc Af Amer: >60 mL/min (ref >60)
Glucose, Bld: 115 mg/dL — ABNORMAL HIGH (ref 70–99)
Potassium: 4 mmol/L (ref 3.5–5.1)
SODIUM: 141 mmol/L (ref 135–145)

## 2017-10-24 MED ORDER — LIDOCAINE HCL (PF) 1 % IJ SOLN
INTRAMUSCULAR | Status: AC | PRN
  Administered 2017-10-24: 10 mL

## 2017-10-24 MED ORDER — FENTANYL CITRATE (PF) 100 MCG/2ML IJ SOLN
INTRAMUSCULAR | Status: AC | PRN
  Administered 2017-10-24 (×2): 50 ug via INTRAVENOUS

## 2017-10-24 MED ORDER — MIDAZOLAM HCL 2 MG/2ML IJ SOLN
INTRAMUSCULAR | Status: AC
  Filled 2017-10-24: qty 4

## 2017-10-24 MED ORDER — FENTANYL CITRATE (PF) 100 MCG/2ML IJ SOLN
INTRAMUSCULAR | Status: AC
  Filled 2017-10-24: qty 2

## 2017-10-24 MED ORDER — LIDOCAINE HCL 1 % IJ SOLN
INTRAMUSCULAR | Status: AC
  Filled 2017-10-24: qty 20

## 2017-10-24 MED ORDER — MIDAZOLAM HCL 2 MG/2ML IJ SOLN
INTRAMUSCULAR | Status: AC | PRN
  Administered 2017-10-24: 2 mg via INTRAVENOUS

## 2017-10-24 MED ORDER — SODIUM CHLORIDE 0.9 % IV SOLN: INTRAVENOUS | Status: DC

## 2017-10-24 MED ORDER — HEPARIN SOD (PORK) LOCK FLUSH 100 UNIT/ML IV SOLN
500.0000 [IU] | Freq: Once | INTRAVENOUS | Status: AC
  Administered 2017-10-24: 500 [IU]
  Filled 2017-10-24: qty 5

## 2017-10-24 NOTE — H&P (Signed)
Referring Physician(s): Neijstrom,Eric  Supervising Physician: Daryll Brod  Patient Status:  [facility] OP  Chief Complaint:  "I'm having a biopsy"  Subjective: Pt familiar to IR service from prior port a cath placement in 2017.  He has a history of gastric/cecal/rectosigmoid cancers and recent PET scan showing significant progression of hypermetabolic metastatic disease with new hypermetabolic left supraclavicular, right retrocrural, retroperitoneal mesenteric nodal metastases.  Hypermetabolic pulmonary metastases are also significantly increased in size in addition to right liver lobe metastases.  He presents today for image guided left supraclavicular lymph node biopsy for further evaluation. He denies fever,HA,CP, dyspnea, abd/back pain,N/V or bleeding. He does have left shoulder pain, occ cough and bilat hip pain. He continues to smoke.   Past Medical History:  Diagnosis Date  . Adenocarcinoma of cecum ([place]) 07/01/2015  . Adenocarcinoma of rectosigmoid junction ([place]) 08/02/2015  . CAD (coronary artery disease) RCA non dominant vessel, LAD 40%, 80-90% 2nd diag EF 55%  01/19/2013   Dr. Claiborne Billings [place] 03-05-15 Epic.  . Family history of colon cancer   . Family history of stomach cancer   . Gastric adenocarcinoma ([place]) 06/29/2015  . Gastric ulcer    many yrs ago  . GERD (gastroesophageal reflux disease)   . Hyperlipidemia LDL goal < 70 01/19/2013  . Hypertension   . Hypokalemia 01/20/2013  . Metabolic syndrome, with mildly elevated HgBA1C 01/20/2013  . Pulmonary nodules 01/19/2013  . STEMI (ST elevation myocardial infarction), 01/19/13 01/19/2013  . Tobacco abuse 01/20/2013  . Transfusion history    with gastric ulcer- many yrs ago   Past Surgical History:  Procedure Laterality Date  . APPENDECTOMY    . BIOPSY  10/05/2016   Procedure: BIOPSY;  Surgeon: Daneil Dolin, MD;  [facility];  Service: Endoscopy;;  sigmoid anastomosis  . CATARACT EXTRACTION, BILATERAL    .  COLONOSCOPY N/A 06/10/2015   RMR: cecal neoplasm  ? biopsied. large polyp at the hepatic flexure removed with piecmeal polypectomy and APC ablation. lesion tattooed. Large polyp in the mid sigmoid status post piecemeal hot snare debulking and tattooing. this lesion not completely removed. scatterd pancolonic diverticulsosis.   Marland Kitchen ESOPHAGOGASTRODUODENOSCOPY N/A 06/10/2015   Procedure: ESOPHAGOGASTRODUODENOSCOPY (EGD);  Surgeon: Daneil Dolin, MD;  [facility];  Service: Endoscopy;  Laterality: N/A;  . ESOPHAGOGASTRODUODENOSCOPY N/A 06/17/2015   RMR: normal esophagus small hiatal hernia. Abnormal nodular antrum .pyloric channel status post biopsy.   Marland Kitchen FLEXIBLE SIGMOIDOSCOPY N/A 10/05/2016   Dr. Gala Romney: stenotic sigmoid ileal anastomosis with abnormal appearing mucosa with appearance like scar tissue, s/p biopsy. biopsies with colonic mucosa and patchy ulceration, acute and chronic inflammation.   Marland Kitchen HERNIA REPAIR Right   . LAPAROSCOPIC SUBTOTAL COLECTOMY N/A 08/02/2015   Procedure: LAPAROSCOPIC SUBTOTAL COLECTOMY  AND DISTAL GASTRECTOMY ;  Surgeon: Stark Klein, MD;  Location: [facility];  Service: General;  Laterality: N/A;  . LEFT HEART CATH N/A 01/19/2013   Procedure: LEFT HEART CATH;  Surgeon: Troy Sine, MD;  Location: [facility] CATH LAB;  Service: Cardiovascular;  Laterality: N/A;      Allergies: Contrast media [iodinated diagnostic agents]  Medications: Prior to Admission medications   Medication Sig Start Date End Date Taking? Authorizing Provider  acetaminophen (TYLENOL) 325 MG tablet Take 650 mg by mouth daily as needed for moderate pain or headache.   Yes [provider]  atorvastatin (LIPITOR) 40 MG tablet TAKE ONE TABLET BY MOUTH EVERY DAY 03/06/17  Yes Troy Sine, MD  lisinopril (PRINIVIL,ZESTRIL)  5 MG tablet Take 0.5 tablets (2.5 mg total) by mouth daily. 09/13/17  Yes Troy Sine, MD  aspirin 81 MG EC tablet Take 81 mg by mouth daily. 11/24/15   [provider]  clopidogrel (PLAVIX) 75 MG tablet TAKE 1 TABLET BY MOUTH DAILY WITH BREAKFAST 10/04/17   Wellington Hampshire, MD  diphenoxylate-atropine (LOMOTIL) 2.5-0.025 MG tablet TAKE 1 TO 2 TABLETS 4 TIMES DAILY AS NEEDED FOR DIARRHEA 10/08/17   Annitta Needs, NP  ezetimibe (ZETIA) 10 MG tablet Take 1 tablet (10 mg total) by mouth daily. 07/13/17 10/11/17  Troy Sine, MD  lidocaine-prilocaine (EMLA) cream APPLY A QUARTER SIZE AMOUNT TO PORT SITE 1 HOUR PRIOR TO CHEMO. DO NOT RUB IN. COVER WITH PLASTIC WRAP. 09/04/15   [provider]  ondansetron (ZOFRAN) 8 MG tablet Take 1 tablet (8 mg total) by mouth every 8 (eight) hours as needed for nausea or vomiting. 09/04/15   Penland, Kelby Fam, MD  pantoprazole (PROTONIX) 40 MG tablet TAKE 1 TABLET BY MOUTH DAILY AT 6:00 A.M. 10/22/17   Troy Sine, MD  prochlorperazine (COMPAZINE) 10 MG tablet Take 1 tablet (10 mg total) by mouth every 6 (six) hours as needed for nausea or vomiting. 09/04/15   Penland, Kelby Fam, MD     Vital Signs: Blood pressure 150/81, heart rate 56, temp 98.7, respirations 16, O2 sat 98% room air    Physical Exam awake, alert; chest with scattered wheezes bilat; clean, intact rt chest wall port a cath;  heart- sl brady but reg rhythm; abd- soft,+BS,NT; no LE edema  Imaging: No results found.  Labs:  CBC: Recent Labs    01/05/17 0842 03/29/17 1445 09/17/17 1458 10/24/17 1042  WBC 8.8 9.1 8.9 8.2  HGB 13.0 12.5* 12.9* 12.0*  HCT 40.5 39.2 39.5 35.5*  PLT 233 192 175 194    COAGS: No results for input(s): INR, APTT in the last 8760 hours.  BMP: Recent Labs    01/05/17 0842 03/29/17 1445 09/11/17 1011 09/17/17 1458  NA 141 140 143 139  K 4.9 4.1 4.7 4.5  CL 105 107 106 108  CO2 24 23 23 25   GLUCOSE 104* 84 106* 109*  BUN 11 14 19  24*  CALCIUM 9.1 8.8* 8.9 8.6*  CREATININE 1.02 1.10 1.53* 1.52*  GFRNONAA 73 >60 44* 43*  GFRAA 84 >60 51* 50*    LIVER FUNCTION TESTS: Recent Labs    01/05/17 0842  03/29/17 1445 09/11/17 1011 09/17/17 1458  BILITOT 0.3 0.5 0.6 0.6  AST 15 15 15 17   ALT 7 11* 11 13  ALKPHOS 142* 120 131* 106  PROT 6.4 6.7 6.3 6.8  ALBUMIN 3.9 3.6 3.9 3.7    Assessment and Plan: Pt with history of gastric/cecal/rectosigmoid cancers and recent PET scan showing significant progression of hypermetabolic metastatic disease with new hypermetabolic left supraclavicular, right retrocrural, retroperitoneal mesenteric nodal metastases.  Hypermetabolic pulmonary metastases are also significantly increased in size in addition to right liver lobe metastases.  He presents today for image guided left supraclavicular lymph node biopsy for further evaluation.Risks and benefits discussed with the patient including, but not limited to bleeding, infection, damage to adjacent structures or low yield requiring additional tests.  All of the patient's questions were answered, patient is agreeable to proceed. Consent signed and in chart.     Electronically Signed: D. Rowe Robert, PA-C 10/24/2017, 11:17 AM   I spent a total of 20 minutes at the the patient's bedside  AND on the patient's hospital floor or unit, greater than 50% of which was counseling/coordinating care for image guided left supraclavicular lymph node biopsy

## 2017-10-24 NOTE — Procedures (Signed)
Colon ca  S/p left cervical adenopathy core bx  No comp Stable Path pending ebl 0 Full report in pacs

## 2017-10-24 NOTE — Discharge Instructions (Signed)
Lymph Node Biopsy, Care After Refer to this sheet in the next few weeks. These instructions provide you with information about caring for yourself after your procedure. Your health care provider may also give you more specific instructions. Your treatment has been planned according to current medical practices, but problems sometimes occur. Call your health care provider if you have any problems or questions after your procedure. What can I expect after the procedure? After the procedure, it is common to have:  Bruising.  Soreness.  Mild swelling.  Follow these instructions at home: Medicines  Take over-the-counter and prescription medicines only as told by your health care provider.  If you were prescribed an antibiotic medicine, take it as told by your health care provider. Do not stop taking the antibiotic even if you start to feel better. Incision care  .  Check your incision area every day for signs of infection. Check for: ? More redness, swelling, or pain. ? More fluid or blood. ? Warmth. ? Pus or a bad smell. Driving  Do not drive for 24 hours if you received a sedative.  Do not drive or operate heavy machinery while taking prescription pain medicine.    Contact a health care provider if:  You have more redness, swelling, or pain around your incision.  You have more fluid or blood coming from your incision.  Your incision feels warm to the touch.  You have pus or a bad smell coming from your incision.  You have a fever.  You have pain or numbness that gets worse or lasts longer than a few days. This information is not intended to replace advice given to you by your health care provider. Make sure you discuss any questions you have with your health care provider. Document Released: 03/12/2015 Document Revised: 07/22/2015 Document Reviewed: 06/10/2014 Elsevier Interactive Patient Education  2018 [employer].    Moderate Conscious Sedation, Adult, Care  After These instructions provide you with information about caring for yourself after your procedure. Your health care provider may also give you more specific instructions. Your treatment has been planned according to current medical practices, but problems sometimes occur. Call your health care provider if you have any problems or questions after your procedure. What can I expect after the procedure? After your procedure, it is common:  To feel sleepy for several hours.  To feel clumsy and have poor balance for several hours.  To have poor judgment for several hours.  To vomit if you eat too soon.  Follow these instructions at home: For at least 24 hours after the procedure:   Do not: ? Participate in activities where you could fall or become injured. ? Drive. ? Use heavy machinery. ? Drink alcohol. ? Take sleeping pills or medicines that cause drowsiness. ? Make important decisions or sign legal documents. ? Take care of children on your own.  Rest. Eating and drinking  Follow the diet recommended by your health care provider.  If you vomit: ? Drink water, juice, or soup when you can drink without vomiting. ? Make sure you have little or no nausea before eating solid foods. General instructions  Have a responsible adult stay with you until you are awake and alert.  Take over-the-counter and prescription medicines only as told by your health care provider.  If you smoke, do not smoke without supervision.  Keep all follow-up visits as told by your health care provider. This is important. Contact a health care provider if:  You keep  feeling nauseous or you keep vomiting.  You feel light-headed.  You develop a rash.  You have a fever. Get help right away if:  You have trouble breathing. This information is not intended to replace advice given to you by your health care provider. Make sure you discuss any questions you have with your health care  provider. Document Released: 12/04/2012 Document Revised: 07/19/2015 Document Reviewed: 06/05/2015 Elsevier Interactive Patient Education  [employer].

## 2017-10-31 ENCOUNTER — Ambulatory Visit (INDEPENDENT_AMBULATORY_CARE_PROVIDER_SITE_OTHER): Payer: Medicare HMO | Admitting: Family Medicine

## 2017-10-31 ENCOUNTER — Encounter: Payer: Self-pay | Admitting: Family Medicine

## 2017-10-31 VITALS — BP 122/62 | HR 54 | Temp 98.0°F | Ht 71.0 in | Wt 164.8 lb

## 2017-10-31 DIAGNOSIS — E785 Hyperlipidemia, unspecified: Secondary | ICD-10-CM | POA: Diagnosis not present

## 2017-10-31 DIAGNOSIS — C189 Malignant neoplasm of colon, unspecified: Secondary | ICD-10-CM | POA: Diagnosis not present

## 2017-10-31 DIAGNOSIS — I10 Essential (primary) hypertension: Secondary | ICD-10-CM

## 2017-10-31 DIAGNOSIS — N400 Enlarged prostate without lower urinary tract symptoms: Secondary | ICD-10-CM

## 2017-10-31 MED ORDER — PANTOPRAZOLE SODIUM 40 MG PO TBEC: DELAYED_RELEASE_TABLET | ORAL | 3 refills | Status: AC

## 2017-10-31 MED ORDER — ATORVASTATIN CALCIUM 40 MG PO TABS: 40.0000 mg | ORAL_TABLET | Freq: Every day | ORAL | 3 refills | Status: AC

## 2017-10-31 NOTE — Progress Notes (Signed)
BP 122/62   Pulse (!) 54   Temp 98 F (36.7 C) (Oral)   Ht 5\' 11"  (1.803 m)   Wt 164 lb 12.8 oz (74.8 kg)   BMI 22.98 kg/m    Subjective:    [name], male    DOB: [birth year], 72 y.o.   MRN: [id number]  [name] is a 72 y.o. male presenting on 10/31/2017 for Hyperlipidemia (6 month follow up)   HPI Hypertension Patient is currently on lisinopril, and their blood pressure today is 122/62. Patient denies any lightheadedness or dizziness. Patient denies headaches, blurred vision, chest pains, shortness of breath, or weakness. Denies any side effects from medication and is content with current medication.   Hyperlipidemia Patient is coming in for recheck of his hyperlipidemia. The patient is currently taking atorvastatin. They deny any issues with myalgias or history of liver damage from it. They deny any focal numbness or weakness or chest pain.   Patient is coming in to discuss enlarged prostate and colon cancer.  He did have a biopsy of the lymph node in his neck just recently and is wondering if we have the results for that.  They were concerned that it could be his colon cancer spreading again.  Patient has a large prostate and wants to see about checking his PSA from previous  Relevant past medical, surgical, family and social history reviewed and updated as indicated. Interim medical history since our last visit reviewed. Allergies and medications reviewed and updated.  Review of Systems  Constitutional: Negative for chills and fever.  Respiratory: Negative for shortness of breath and wheezing.   Cardiovascular: Negative for chest pain and leg swelling.  Musculoskeletal: Negative for back pain and gait problem.  Skin: Negative for rash.  Neurological: Negative for dizziness, weakness, light-headedness, numbness and headaches.  All other systems reviewed and are negative.   Per HPI unless specifically indicated above   Allergies as of 10/31/2017        Reactions   Contrast Media [iodinated Diagnostic Agents] Itching, Rash      Medication List        Accurate as of 10/31/17  3:53 PM. Always use your most recent med list.          acetaminophen 325 MG tablet Commonly known as:  TYLENOL Take 650 mg by mouth daily as needed for moderate pain or headache.   aspirin 81 MG EC tablet Take 81 mg by mouth daily.   atorvastatin 40 MG tablet Commonly known as:  LIPITOR Take 1 tablet (40 mg total) by mouth daily.   clopidogrel 75 MG tablet Commonly known as:  PLAVIX TAKE 1 TABLET BY MOUTH DAILY WITH BREAKFAST   diphenoxylate-atropine 2.5-0.025 MG tablet Commonly known as:  LOMOTIL TAKE 1 TO 2 TABLETS 4 TIMES DAILY AS NEEDED FOR DIARRHEA   ezetimibe 10 MG tablet Commonly known as:  ZETIA Take 1 tablet (10 mg total) by mouth daily.   lidocaine-prilocaine cream Commonly known as:  EMLA APPLY A QUARTER SIZE AMOUNT TO PORT SITE 1 HOUR PRIOR TO CHEMO. DO NOT RUB IN. COVER WITH PLASTIC WRAP.   lisinopril 5 MG tablet Commonly known as:  PRINIVIL,ZESTRIL Take 0.5 tablets (2.5 mg total) by mouth daily.   ondansetron 8 MG tablet Commonly known as:  ZOFRAN Take 1 tablet (8 mg total) by mouth every 8 (eight) hours as needed for nausea or vomiting.   pantoprazole 40 MG tablet Commonly known as:  PROTONIX TAKE  1 TABLET BY MOUTH DAILY AT 6:00 A.M.   prochlorperazine 10 MG tablet Commonly known as:  COMPAZINE Take 1 tablet (10 mg total) by mouth every 6 (six) hours as needed for nausea or vomiting.          Objective:    BP 122/62   Pulse (!) 54   Temp 98 F (36.7 C) (Oral)   Ht 5\' 11"  (1.803 m)   Wt 164 lb 12.8 oz (74.8 kg)   BMI 22.98 kg/m   Wt Readings from Last 3 Encounters:  10/31/17 164 lb 12.8 oz (74.8 kg)  10/24/17 163 lb (73.9 kg)  10/08/17 161 lb 6.4 oz (73.2 kg)    Physical Exam  Constitutional: He is oriented to person, place, and time. He appears well-developed and well-nourished. No distress.  Eyes:  Conjunctivae are normal. No scleral icterus.  Neck: Neck supple. No thyromegaly present.  Cardiovascular: Normal rate, regular rhythm, normal heart sounds and intact distal pulses.  No murmur heard. Pulmonary/Chest: Effort normal and breath sounds normal. No respiratory distress. He has no wheezes.  Musculoskeletal: Normal range of motion. He exhibits no edema.  Lymphadenopathy:    He has cervical adenopathy.  Neurological: He is alert and oriented to person, place, and time. Coordination normal.  Skin: Skin is warm and dry. No rash noted. He is not diaphoretic.  Psychiatric: He has a normal mood and affect. His behavior is normal.  Nursing note and vitals reviewed.       Assessment & Plan:   Problem List Items Addressed This Visit      Cardiovascular and Mediastinum   Hypertension - Primary   Relevant Medications   atorvastatin (LIPITOR) 40 MG tablet     Other   Hyperlipidemia with target LDL less than 70   Relevant Medications   atorvastatin (LIPITOR) 40 MG tablet    Other Visit Diagnoses    Enlarged prostate       Relevant Orders   PSA, total and free   Primary colon cancer with metastasis to other site [facility])        Patient had a lymph node in his neck that was positive for metastatic colon cancer  Follow up plan: Return in about 6 months (around 05/01/2018), or if symptoms worsen or fail to improve, for Hypertension and cholesterol recheck.  Counseling provided for all of the vaccine components Orders Placed This Encounter  Procedures  . PSA, total and free    Caryl Pina, MD [facility] 10/31/2017, 3:53 PM

## 2017-11-05 ENCOUNTER — Encounter: Payer: Self-pay | Admitting: Family Medicine

## 2017-11-13 ENCOUNTER — Ambulatory Visit (INDEPENDENT_AMBULATORY_CARE_PROVIDER_SITE_OTHER): Payer: Medicare HMO | Admitting: Cardiovascular Disease

## 2017-11-13 ENCOUNTER — Ambulatory Visit: Payer: Medicare HMO | Admitting: Cardiovascular Disease

## 2017-11-13 ENCOUNTER — Encounter: Payer: Self-pay | Admitting: Cardiovascular Disease

## 2017-11-13 VITALS — BP 130/60 | HR 51 | Ht 71.0 in | Wt 164.4 lb

## 2017-11-13 DIAGNOSIS — I10 Essential (primary) hypertension: Secondary | ICD-10-CM | POA: Diagnosis not present

## 2017-11-13 DIAGNOSIS — Z85038 Personal history of other malignant neoplasm of large intestine: Secondary | ICD-10-CM

## 2017-11-13 DIAGNOSIS — I739 Peripheral vascular disease, unspecified: Secondary | ICD-10-CM

## 2017-11-13 DIAGNOSIS — Z85028 Personal history of other malignant neoplasm of stomach: Secondary | ICD-10-CM

## 2017-11-13 DIAGNOSIS — Z720 Tobacco use: Secondary | ICD-10-CM | POA: Diagnosis not present

## 2017-11-13 DIAGNOSIS — E785 Hyperlipidemia, unspecified: Secondary | ICD-10-CM | POA: Diagnosis not present

## 2017-11-13 DIAGNOSIS — I2510 Atherosclerotic heart disease of native coronary artery without angina pectoris: Secondary | ICD-10-CM

## 2017-11-13 DIAGNOSIS — Z716 Tobacco abuse counseling: Secondary | ICD-10-CM

## 2017-11-13 NOTE — Progress Notes (Signed)
[name], male   DOB: May 23, 1945, 72 y.o.   MRN: [id number]     [name] is a 72 y.o. male who presents for a 6 month followup cardiology evaluation.  [name] has established CAD and was transferred acutely from [facility] on 01/19/2013 with possible ST segment elevation myocardial infarction.  Initial troponin was negative but a subsequent troponin was positive and he was noted to have anterolateral ST changes. Upon arrival to [facility] he had T-wave inversion in 1 and aVL, V4 through V6. Cardiac catheterization done emergently by me showed low normal LV function with moderately severe focal hypocontractility of the mid anterolateral wall and minimal hypocontractility of the focal inferolateral wall. Ejection fraction was approximately 50-55%. He was found to have 40% smooth ostial narrowing of the LAD, a dominant left circumflex vessel with 70% ostial narrowing in the first marginal branch and diffuse 50% narrowing in the second marginal branch. His RCA was nondominant and had a 95% proximal stenosis and he had a subtotal/total mid occlusion with moderate right to right bridging collaterals and extensive left to right collaterals. Medical therapy was recommended and smoking cessation was felt to be imperative.   He has a long-standing history of tobacco and continues to smoke 1 ppd.  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  He continues to be on dual antiplatelet therapy and denies bleeding.  He also is on lisinopril 5 mg, amlodipine 2.5 mg and atorvastatin 40 mg. initially had been on Zetia but is no longer taking this.  He has history of GERD for which she is on Protonix.  He presents for evaluation.  Laboratory from 08/24/2014.  He had been working outside in significant heat.  Hemoglobin was 13.2 medical 38.9.  BUN 15, creatinine 1.53, which had increased from one year ago.   He is on a reduced dose of lisinopril at 5 mg.  Cholesterol was 140, triglycerides are elevated at 202, HDL was low at 25, and LDL 75.  TSH was normal.  Lower extremity Doppler evaluation in January 2017  revealed triphasic waveforms.  ABI was 1.1 on the right 1.0 on the left.  However, bilateral great toe PPG's were abnormal.  He underwent surgery, both for colon cancer and gastric adenocarcinoma with a laparoscopic subtotal colectomy with ileorectal anastomosis and open distal gastrectomy with Billroth II reconstruction on 08/02/2015.  He tolerated surgery without cardiovascular compromise.    When I saw him in 2018, he continued to smoke cigarettes.  He started smoking at age 72 and has been smoking for over 25 years.  He denied any chest tightness potation  I last saw him in May 2019.  He was continuing to smoke 1 pack of cigarettes per day.  He sees Dr. Warrick Parisian for primary care.  He had experienced bilateral hip discomfort with walking.  I referred him for lower extremity duplex evaluation which showed bilateral lower extremity plaque stenosis in the 50 to 74% range in the right common femoral artery and distal superficial femoral artery.  On the left there was 50 to 74% stenosis in the distal superior femoral artery.  He had bilateral three-vessel runoff.  ABII was 0.92 on the right and 0.85 on the left.  He was subsequently evaluated by Dr.Arida and reported bilateral calf discomfort which occurs at variable distance and was not consistently occurring.  Felt his disease was mild to moderate  by duplex was not felt to be significantly obstructive.  He also felt he had somewhat atypical clot [place] and previously strongly recommended by me he also strongly advised smoking cessation.  He has had issues with shortness of breath.  He has a history of gastric/cecal/rectosigmoid cancers and a recent PET scan showed significant progression of hypermetabolic metastatic disease with new hypermetabolic left  subclavian clavicular, right retrocrural, retroperitoneal mesenteric nodal metastases.  Hypermetabolic pulmonary metastases were also significantly increased in size in addition to right liver lobe metastases.  He underwent recent left supraclavicular lymph node biopsy.  He denies chest tightness.  He denies palpitations.  He presents for follow-up evaluation.  He has continued to be on atorvastatin 40 mg for hyperlipidemia and lisinopril 5 mg for hypertension in addition to dual antiplatelet therapy and his GERD has been treated with pantoprazole.   Past Medical History  Diagnosis Date  . GERD (gastroesophageal reflux disease)   . STEMI (ST elevation myocardial infarction), 01/19/13 01/19/2013  . CAD (coronary artery disease) RCA non dominant vessel, LAD 40%, 80-90% 2nd diag EF 55%  01/19/2013  . Hyperlipidemia LDL goal < 70 01/19/2013  . Pulmonary nodules 01/19/2013  . Hypokalemia 01/20/2013  . Tobacco abuse 01/20/2013  . Metabolic syndrome, with mildly elevated HgBA1C 01/20/2013    Past Surgical History  Procedure Laterality Date  . Appendectomy      No Known Allergies  Current Outpatient Prescriptions  Medication Sig Dispense Refill  . acetaminophen (TYLENOL) 325 MG tablet Take 2 tablets (650 mg total) by mouth every 4 (four) hours as needed for headache or mild pain.      Marland Kitchen aspirin EC 81 MG EC tablet Take 1 tablet (81 mg total) by mouth daily.      . nitroGLYCERIN (NITROSTAT) 0.4 MG SL tablet Place 1 tablet (0.4 mg total) under the tongue every 5 (five) minutes x 3 doses as needed for chest pain.  25 tablet  4  . amLODipine (NORVASC) 2.5 MG tablet Take 1 tablet (2.5 mg total) by mouth daily.  30 tablet  6  . atorvastatin (LIPITOR) 80 MG tablet Take 1 tablet (80 mg total) by mouth daily at 6 PM.  30 tablet  6  . clopidogrel (PLAVIX) 75 MG tablet Take 1 tablet (75 mg total) by mouth daily with breakfast.  30 tablet  6  . lisinopril (PRINIVIL,ZESTRIL) 5 MG tablet Take 1 tablet (5 mg  total) by mouth daily.  30 tablet  6  . pantoprazole (PROTONIX) 40 MG tablet Take 1 tablet (40 mg total) by mouth daily at 6 (six) AM.  30 tablet  6   No current facility-administered medications for this visit.    History   Social History  . Marital Status: Widowed    Spouse Name: N/A    Number of Children: N/A  . Years of Education: N/A   Occupational History  . farmer    Social History Main Topics  . Smoking status: Current Every Day Smoker -- 1.00 packs/day for 75 years    Types: Cigarettes    Start date: 01/21/1963  . Smokeless tobacco: Never Used  . Alcohol Use: Not on file  . Drug Use: Not on file  . Sexual Activity: Not on file   Other Topics Concern  . Not on file   Social History Narrative  . No narrative on file   Social history is notable in that he is widowed. He does not have children. He is retired.  He continues  to smoke cigarettes, 1 pack/day. Family history is notable for parents are deceased.  ROS General: Negative; No fevers, chills, or night sweats;  HEENT: Negative; No changes in vision or hearing, sinus congestion, difficulty swallowing Pulmonary: Negative; No cough, wheezing, shortness of breath, hemoptysis Cardiovascular: Negative; No chest pain, presyncope, syncope, palpatations Positive for bilateral hip pain with walking. GI: Negative; No nausea, vomiting, diarrhea, or abdominal pain GU: Negative; No dysuria, hematuria, or difficulty voiding Musculoskeletal: Occasional shoulder discomfort; no myalgias,  or weakness Hematologic/Oncology: History of gastric, cecal, rectosigmoid cancers with hypermetabolic metastatic disease. Endocrine: Negative; no heat/cold intolerance; no diabetes Neuro: Negative; no changes in balance, headaches Skin: Negative; No rashes or skin lesions Psychiatric: Negative; No behavioral problems, depression Sleep: Negative; No snoring, daytime sleepiness, hypersomnolence, bruxism, restless legs, hypnogognic  hallucinations, no cataplexy Other comprehensive 14 point system review is negative.   PE BP 130/60   Pulse (!) 51   Ht '5\' 11"'$  (1.803 m)   Wt 164 lb 6.4 oz (74.6 kg)   BMI 22.93 kg/m    Repeat blood pressure by me 124/60  Wt Readings from Last 3 Encounters:  11/13/17 164 lb 6.4 oz (74.6 kg)  10/31/17 164 lb 12.8 oz (74.8 kg)  10/24/17 163 lb (73.9 kg)   General: Alert, oriented, no distress.  Appears significantly older than stated age with significant facial wrinkles. Skin: normal turgor, no rashes, warm and dry HEENT: Normocephalic, atraumatic. Pupils equal round and reactive to light; sclera anicteric; extraocular muscles intact;  Nose without nasal septal hypertrophy Mouth/Parynx benign; Mallinpatti scale 3 Neck: No JVD, no carotid bruits; normal carotid upstroke Lungs: clear to ausculatation and percussion; no wheezing or rales Chest wall: without tenderness to palpitation Heart: PMI not displaced, RRR, s1 s2 normal, 1/6 systolic murmur, no diastolic murmur, no rubs, gallops, thrills, or heaves Abdomen: soft, nontender; no hepatosplenomehaly, BS+; abdominal aorta nontender and not dilated by palpation. Back: no CVA tenderness Pulses 2+ bilateral femoral artery bruits Musculoskeletal: full range of motion, normal strength, no joint deformities Extremities: no clubbing cyanosis or edema, Homan's sign negative  Neurologic: grossly nonfocal; Cranial nerves grossly wnl Psychologic: Normal mood and affect   ECG (independently read by me): Sinus bradycardia 51 bpm.  Left axis deviation.  Normal intervals.  May 2019 ECG (independently read by me): Bradycardia 54 bpm.  Nonspecific ST-T abnormality.  Normal intervals.  No ectopy.  November 2018 ECG (independently read by me): Sinus bradycardia 59 bpm.  No ectopy.  Normal intervals.  No ST segment changes  October 2017 ECG (independently read by me): Sinus bradycardia at 59 bpm.  January 2017 ECG (independently read by me):  Sinus bradycardia with 1 PAC.  Heart rate 54 bpm.  Normal intervals.  No significant ST-T changes.  June 2016 ECG (independently read by me): Sinus bradycardia 53 bpm.  January 2016 ECG (independently read by me): Sinus bradycardia 51 bpm.  July 2015 ECG (independently read by me): Sinus bradycardia 51 beats per minute.  No ectopy.  QTc interval 414 ms.  Prior 05/06/2013 ECG (independently read by me): Sinus bradycardia 51 beats per minute. No ectopy. No significant ST changes.  Prior ECG from 02/04/2013 ECG: Sinus rhythm with T-wave inversion in leads 1 and avL and V2  LABS: BMP Latest Ref Rng & Units 10/24/2017 09/17/2017 09/11/2017  Glucose 70 - 99 mg/dL 115(H) 109(H) 106(H)  BUN 8 - 23 mg/dL 15 24(H) 19  Creatinine 0.61 - 1.24 mg/dL 1.14 1.52(H) 1.53(H)  BUN/Creat Ratio 10 - 24 - -  12  Sodium 135 - 145 mmol/L 141 139 143  Potassium 3.5 - 5.1 mmol/L 4.0 4.5 4.7  Chloride 98 - 111 mmol/L 107 108 106  CO2 22 - 32 mmol/L '28 25 23  '$ Calcium 8.9 - 10.3 mg/dL 8.7(L) 8.6(L) 8.9   Hepatic Function Latest Ref Rng & Units 09/17/2017 09/11/2017 03/29/2017  Total Protein 6.5 - 8.1 g/dL 6.8 6.3 6.7  Albumin 3.5 - 5.0 g/dL 3.7 3.9 3.6  AST 15 - 41 U/L '17 15 15  '$ ALT 0 - 44 U/L 13 11 11(L)  Alk Phosphatase 38 - 126 U/L 106 131(H) 120  Total Bilirubin 0.3 - 1.2 mg/dL 0.6 0.6 0.5   CBC Latest Ref Rng & Units 10/24/2017 09/17/2017 03/29/2017  WBC 4.0 - 10.5 K/uL 8.2 8.9 9.1  Hemoglobin 13.0 - 17.0 g/dL 12.0(L) 12.9(L) 12.5(L)  Hematocrit 39.0 - 52.0 % 35.5(L) 39.5 39.2  Platelets 150 - 400 K/uL 194 175 192   Lab Results  Component Value Date   MCV 93.7 10/24/2017   MCV 94.5 09/17/2017   MCV 92.2 03/29/2017   Lab Results  Component Value Date   TSH 1.130 01/05/2017   Lab Results  Component Value Date   HGBA1C 6.2 (H) 07/29/2015   Lipid Panel     Component Value Date/Time   CHOL 109 09/11/2017 1011   TRIG 106 09/11/2017 1011   HDL 28 (L) 09/11/2017 1011   CHOLHDL 3.9 09/11/2017 1011    CHOLHDL 4.8 04/13/2015 1252   VLDL 32 (H) 04/13/2015 1252   LDLCALC 60 09/11/2017 1011     RADIOLOGY: No results found.  IMPRESSION:  1. CAD in native artery   2. Essential hypertension   3. Tobacco abuse   4. PAD (peripheral artery disease) ([place])   5. Hyperlipidemia with target LDL less than 70   6. Tobacco abuse counseling   7. History of colon cancer   8. History of gastric cancer     ASSESSMENT AND PLAN: Mr. Kraynak is a 72 year old gentleman who presented with an acute coronary syndrome and underwent emergent cardiac catheterization on11/23/2014. He was found to have subtotal occlusion of his RCA and had both right to right and extensive left-to-right collaterals. He also had concomitant CAD as noted above with 80-90% stenosis in a small second diagonal branch of his LAD as well as 70 and 50% stenoses in his marginal vessels. He had a mild wall motion abnormality consistent with his diagonal stenosis.  He has been on medical therapy.  Presently, he continues to be free of angina symptomatology.  He continues to be on dual antiplatelet therapy with aspirin and Plavix.  He continues to be on atorvastatin 40 mg and Zetia 10 mg and most recent LDL was excellent on September 11, 2017 at 60.  His blood pressure today remains stable on lisinopril 5 mg.  I reviewed his lower extremity duplex evaluation.  This has progressed since the previous 2017 evaluation with slight reduction of ABIs most prominent on the left.  I reviewed his assessment by Dr. Fletcher Anon with plans for continued medical therapy and again I strongly recommended smoking cessation and discussed with him the potential progression to critical limb ischemia with ongoing tobacco use.  I also reviewed his recent oncologic history with his significant progression of hypermetabolic metastatic disease.  His GERD GERD is controlled with pantoprazole.  I will see him in 6 months for reevaluation.   Time spent: 25 minutes Troy Sine, MD,  [facility]  9:21 AM  11/14/2017      

## 2017-11-13 NOTE — Patient Instructions (Signed)

## 2017-11-14 ENCOUNTER — Encounter: Payer: Self-pay | Admitting: Cardiovascular Disease

## 2017-11-16 DIAGNOSIS — C7800 Secondary malignant neoplasm of unspecified lung: Secondary | ICD-10-CM | POA: Diagnosis not present

## 2017-11-21 ENCOUNTER — Other Ambulatory Visit: Payer: Self-pay | Admitting: Family Medicine

## 2017-11-21 ENCOUNTER — Encounter (HOSPITAL_COMMUNITY): Payer: Self-pay | Admitting: Oncology

## 2017-11-30 DIAGNOSIS — C787 Secondary malignant neoplasm of liver and intrahepatic bile duct: Secondary | ICD-10-CM | POA: Diagnosis not present

## 2017-11-30 DIAGNOSIS — C19 Malignant neoplasm of rectosigmoid junction: Secondary | ICD-10-CM | POA: Diagnosis not present

## 2017-11-30 DIAGNOSIS — C7800 Secondary malignant neoplasm of unspecified lung: Secondary | ICD-10-CM | POA: Diagnosis not present

## 2017-12-27 ENCOUNTER — Encounter: Payer: Self-pay | Admitting: Internal Medicine

## 2018-01-01 ENCOUNTER — Telehealth: Payer: Self-pay | Admitting: Gastroenterology

## 2018-01-01 ENCOUNTER — Encounter: Payer: Self-pay | Admitting: Internal Medicine

## 2018-01-01 NOTE — Telephone Encounter (Signed)
PATIENT SCHEDULED AND LETTER SENT  °

## 2018-01-01 NOTE — Telephone Encounter (Signed)
Please make sure patient has appt in 6 months.   History of gastric cancer, colon cancers.

## 2018-01-15 DIAGNOSIS — Z95828 Presence of other vascular implants and grafts: Secondary | ICD-10-CM | POA: Diagnosis not present

## 2018-01-15 DIAGNOSIS — C7800 Secondary malignant neoplasm of unspecified lung: Secondary | ICD-10-CM | POA: Diagnosis not present

## 2018-01-15 DIAGNOSIS — C787 Secondary malignant neoplasm of liver and intrahepatic bile duct: Secondary | ICD-10-CM | POA: Diagnosis not present

## 2018-01-16 ENCOUNTER — Emergency Department (HOSPITAL_COMMUNITY): Payer: Medicare HMO

## 2018-01-16 ENCOUNTER — Encounter (HOSPITAL_COMMUNITY): Payer: Self-pay

## 2018-01-16 ENCOUNTER — Emergency Department (HOSPITAL_COMMUNITY)
Admission: EM | Admit: 2018-01-16 | Discharge: 2018-01-16 | Disposition: A | Discharge status: Home / Self Care | Payer: Medicare HMO | Attending: Emergency Medicine | Admitting: Emergency Medicine

## 2018-01-16 ENCOUNTER — Other Ambulatory Visit: Payer: Self-pay

## 2018-01-16 DIAGNOSIS — F17210 Nicotine dependence, cigarettes, uncomplicated: Secondary | ICD-10-CM | POA: Diagnosis not present

## 2018-01-16 DIAGNOSIS — I10 Essential (primary) hypertension: Secondary | ICD-10-CM | POA: Insufficient documentation

## 2018-01-16 DIAGNOSIS — S32502A Unspecified fracture of left pubis, initial encounter for closed fracture: Secondary | ICD-10-CM | POA: Diagnosis not present

## 2018-01-16 DIAGNOSIS — I252 Old myocardial infarction: Secondary | ICD-10-CM | POA: Diagnosis not present

## 2018-01-16 DIAGNOSIS — Z7982 Long term (current) use of aspirin: Secondary | ICD-10-CM | POA: Diagnosis not present

## 2018-01-16 DIAGNOSIS — Z7902 Long term (current) use of antithrombotics/antiplatelets: Secondary | ICD-10-CM | POA: Insufficient documentation

## 2018-01-16 DIAGNOSIS — I2510 Atherosclerotic heart disease of native coronary artery without angina pectoris: Secondary | ICD-10-CM | POA: Diagnosis not present

## 2018-01-16 DIAGNOSIS — E785 Hyperlipidemia, unspecified: Secondary | ICD-10-CM | POA: Insufficient documentation

## 2018-01-16 DIAGNOSIS — Z79899 Other long term (current) drug therapy: Secondary | ICD-10-CM | POA: Insufficient documentation

## 2018-01-16 DIAGNOSIS — S79912A Unspecified injury of left hip, initial encounter: Secondary | ICD-10-CM | POA: Diagnosis not present

## 2018-01-16 DIAGNOSIS — M25552 Pain in left hip: Secondary | ICD-10-CM | POA: Diagnosis not present

## 2018-01-16 MED ORDER — DICLOFENAC EPOLAMINE 1.3 % TD PTCH
1.0000 | MEDICATED_PATCH | Freq: Two times a day (BID) | TRANSDERMAL | Status: DC
  Administered 2018-01-16: 1 via TRANSDERMAL
  Filled 2018-01-16 (×5): qty 1

## 2018-01-16 MED ORDER — KETOROLAC TROMETHAMINE 30 MG/ML IJ SOLN
15.0000 mg | Freq: Once | INTRAMUSCULAR | Status: AC
  Administered 2018-01-16: 15 mg via INTRAMUSCULAR
  Filled 2018-01-16: qty 1

## 2018-01-16 MED ORDER — TRAMADOL HCL 50 MG PO TABS: 50.0000 mg | ORAL_TABLET | Freq: Four times a day (QID) | ORAL | 0 refills | Status: DC | PRN

## 2018-01-16 NOTE — Discharge Instructions (Addendum)
As discussed, your evaluation today has been largely reassuring.  But, it is important that you monitor your condition carefully, and do not hesitate to return to the ED if you develop new, or concerning changes in your condition.  The provided patches, and, if necessary, the prescription.  Otherwise, please follow-up with your physician for appropriate ongoing care.

## 2018-01-16 NOTE — ED Notes (Signed)
Went into room to reassess pain, patient states "it's not hurting at all right now because I'm not moving it. And I don't want to move it to see if it's still hurting." Patient alert, talking and laughing at this time.

## 2018-01-16 NOTE — ED Provider Notes (Signed)
[facility] EMERGENCY DEPARTMENT Provider Note   CSN: [id number] Arrival date & time: 01/16/18  1231     History   Chief Complaint Chief Complaint  Patient presents with  . Hip Pain    HPI Steven Drake is a 72 y.o. male.  HPI Patient presents with concern of left hip pain. Patient is here with caregiver who assists with the HPI. Yesterday, after arising from the couch, feeling an audible pop, the patient developed pain. Since that time the pain is been persistent, in the left inguinal area, left proximal medial thigh. Patient is reportedly capable of ambulating secondary to pain. He was, however, able to ambulate to a physician's appointment yesterday after the onset, but as the pain is become more severe, he has developed inability to walk. No other new complaints including incontinence, falling, fever. Patient has multiple medical issues most notably metastatic colon cancer, now no longer receiving chemotherapy.  Since onset he has not taken any medication for pain relief. Past Medical History:  Diagnosis Date  . Adenocarcinoma of cecum ([place]) 07/01/2015  . 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 CAD (coronary artery disease) RCA non dominant vessel, LAD 40%, 80-90% 2nd diag EF 55%  01/19/2013   Dr. Claiborne Billings [place] 03-05-15 Epic.  . Family history of colon cancer   . Family history of stomach cancer   . Gastric adenocarcinoma ([place]) 06/29/2015  . Gastric ulcer    many yrs ago  . GERD (gastroesophageal reflux disease)   . Hyperlipidemia LDL goal < 70 01/19/2013  . Hypertension   . Hypokalemia 01/20/2013  . Metabolic syndrome, with mildly elevated HgBA1C 01/20/2013  . Pulmonary nodules 01/19/2013  . STEMI (ST elevation myocardial infarction), 01/19/13 01/19/2013  . Tobacco abuse 01/20/2013  . Transfusion history    with gastric ulcer- many yrs ago    Patient Active Problem List   Diagnosis Date Noted  . Hypertension 04/27/2017  . Iron deficiency anemia  03/03/2016  . Colon cancer ([place]) 01/24/2016  . Gastric cancer ([place]) 01/24/2016  . Family history of colon cancer   . Family history of stomach cancer   . 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 Adenocarcinoma of cecum ([place]) 07/01/2015  . Gastric adenocarcinoma ([place]) 06/29/2015  . History of colonic polyps   . Mucosal abnormality of stomach   . Dysphagia   . Hiatal hernia   . Schatzki's ring   . Heme + stool 05/20/2015  . Anemia 05/20/2015  . Enlarged prostate on rectal examination 05/04/2015  . Hip discomfort 03/05/2015  . CAD in native artery 08/27/2014  . Hypokalemia-replaced 01/20/2013  . Tobacco abuse 01/20/2013  . Metabolic syndrome, with mildly elevated HgBA1C 01/20/2013  . Non-STEMI (non-ST elevated myocardial infarction), involving RCA/ LAD-diag  01/19/2013  . CAD (coronary artery disease) RCA non dominant vessel, LAD 50-60%, 80-90% 2nd diag EF 55%  01/19/2013  . Hyperlipidemia with target LDL less than 70 01/19/2013  . Pulmonary nodules 01/19/2013    Past Surgical History:  Procedure Laterality Date  . APPENDECTOMY    . BIOPSY  10/05/2016   Procedure: BIOPSY;  Surgeon: Daneil Dolin, MD;  Location: [facility] ENDO SUITE;  Service: Endoscopy;;  sigmoid anastomosis  . CATARACT EXTRACTION, BILATERAL    . COLONOSCOPY N/A 06/10/2015   RMR: cecal neoplasm  ? biopsied. large polyp at the hepatic flexure removed with piecmeal polypectomy and APC ablation. lesion tattooed. Large polyp in the mid sigmoid status post piecemeal hot snare debulking and tattooing. this lesion  not completely removed. scatterd pancolonic diverticulsosis.   Marland Kitchen ESOPHAGOGASTRODUODENOSCOPY N/A 06/10/2015   Procedure: ESOPHAGOGASTRODUODENOSCOPY (EGD);  Surgeon: Daneil Dolin, MD;  Location: [facility] ENDO SU[facility];  Service: Endoscopy;  Laterality: N/A;  . ESOPHAGOGASTRODUODENOSCOPY N/A 06/17/2015   RMR: normal esophagus small hiatal hernia. Abnormal nodular antrum .pyloric channel status post biopsy.   Marland Kitchen  FLEXIBLE SIGMOIDOSCOPY N/A 10/05/2016   Dr. Gala Romney: stenotic sigmoid ileal anastomosis with abnormal appearing mucosa with appearance like scar tissue, s/p biopsy. biopsies with colonic mucosa and patchy ulceration, acute and chronic inflammation.   Marland Kitchen HERNIA REPAIR Right   . LAPAROSCOPIC SUBTOTAL COLECTOMY N/A 08/02/2015   Procedure: LAPAROSCOPIC SUBTOTAL COLECTOMY  AND DISTAL GASTRECTOMY ;  Surgeon: Stark Klein, MD;  Location: [facility] ORS;  Service: General;  Laterality: N/A;  . LEFT HEART CATH N/A 01/19/2013   Procedure: LEFT HEART CATH;  Surgeon: Troy Sine, MD;  Location: [facility] CATH LAB;  Service: Cardiovascular;  Laterality: N/A;        Home Medications    Prior to Admission medications   Medication Sig Start Date End Date Taking? Authorizing Provider  acetaminophen (TYLENOL) 325 MG tablet Take 650 mg by mouth daily as needed for moderate pain or headache.   Yes [provider]  aspirin 81 MG EC tablet Take 81 mg by mouth daily. 11/24/15  Yes [provider]  atorvastatin (LIPITOR) 40 MG tablet Take 1 tablet (40 mg total) by mouth daily. 10/31/17  Yes Dettinger, Fransisca Kaufmann, MD  clopidogrel (PLAVIX) 75 MG tablet TAKE 1 TABLET BY MOUTH DAILY WITH BREAKFAST 10/04/17  Yes Wellington Hampshire, MD  diphenoxylate-atropine (LOMOTIL) 2.5-0.025 MG tablet TAKE 1 TO 2 TABLETS 4 TIMES DAILY AS NEEDED FOR DIARRHEA 10/08/17  Yes Annitta Needs, NP  ezetimibe (ZETIA) 10 MG tablet Take 10 mg by mouth daily. 12/30/17  Yes [provider]  lidocaine-prilocaine (EMLA) cream APPLY A QUARTER SIZE AMOUNT TO PORT SITE 1 HOUR PRIOR TO CHEMO. DO NOT RUB IN. COVER WITH PLASTIC WRAP. 09/04/15  Yes [provider]  lisinopril (PRINIVIL,ZESTRIL) 5 MG tablet Take 0.5 tablets (2.5 mg total) by mouth daily. 09/13/17  Yes Troy Sine, MD  ondansetron (ZOFRAN) 8 MG tablet Take 1 tablet (8 mg total) by mouth every 8 (eight) hours as needed for nausea or vomiting. 09/04/15  Yes Penland, Kelby Fam, MD    pantoprazole (PROTONIX) 40 MG tablet TAKE 1 TABLET BY MOUTH DAILY AT 6:00 A.M. 10/31/17  Yes Dettinger, Fransisca Kaufmann, MD  prochlorperazine (COMPAZINE) 10 MG tablet Take 1 tablet (10 mg total) by mouth every 6 (six) hours as needed for nausea or vomiting. 09/04/15  Yes Penland, Kelby Fam, MD  ezetimibe (ZETIA) 10 MG tablet Take 1 tablet (10 mg total) by mouth daily. 07/13/17 10/11/17  Troy Sine, MD    Family History Family History  Problem Relation Age of Onset  . Heart attack Mother   . Colon cancer Father        dx in his 13s  . Colon cancer Sister        dx possibly in her early 14s  . Stomach cancer Sister        dx in her 77s  . Colon cancer Paternal Uncle     Social History Social History   Tobacco Use  . Smoking status: Current Every Day Smoker    Packs/day: 1.00    Years: 58.00    Pack years: 58.00    Types: Cigarettes    Start  date: 01/21/1963  . Smokeless tobacco: Never Used  . Tobacco comment: one pack daily  Substance Use Topics  . Alcohol use: No    Alcohol/week: 0.0 standard drinks    Comment: none in 15 yrs -heavy user-none now.  . Drug use: No     Allergies   Contrast media [iodinated diagnostic agents]   Review of Systems Review of Systems  Constitutional:       Per HPI, otherwise negative  HENT:       Per HPI, otherwise negative  Respiratory:       Per HPI, otherwise negative  Cardiovascular:       Per HPI, otherwise negative  Gastrointestinal: Negative for vomiting.  Endocrine:       Negative aside from HPI  Genitourinary:       Neg aside from HPI   Musculoskeletal:       Per HPI, otherwise negative  Skin: Negative.   Allergic/Immunologic: Positive for immunocompromised state.  Neurological: Positive for weakness. Negative for syncope.     Physical Exam Updated Vital Signs BP (!) 155/68 (BP Location: Right Arm)   Pulse (!) 55   Temp 97.8 F (36.6 C) (Oral)   Resp 16   Ht 5\' 11"  (1.803 m)   Wt 75.3 kg   SpO2 99%   BMI 23.15  kg/m   Physical Exam  Constitutional: He is oriented to person, place, and time. He has a sickly appearance. No distress.  HENT:  Head: Normocephalic and atraumatic.  Eyes: Conjunctivae and EOM are normal.  Cardiovascular: Normal rate and regular rhythm.  Pulmonary/Chest: Effort normal. No stridor. No respiratory distress.  Abdominal: He exhibits no distension.  Musculoskeletal: He exhibits no edema.       Left knee: Normal.       Legs: Neurological: He is alert and oriented to person, place, and time.  Skin: Skin is warm and dry.  Psychiatric: He has a normal mood and affect.  Nursing note and vitals reviewed.    ED Treatments / Results  Labs (all labs ordered are listed, but only abnormal results are displayed) Labs Reviewed - No data to display  EKG None  Radiology Ct Pelvis Wo Contrast  Result Date: 01/16/2018 CLINICAL DATA:  Pain in LEFT hip.  No fall. EXAM: CT PELVIS WITHOUT CONTRAST TECHNIQUE: Multidetector CT imaging of the pelvis was performed following the standard protocol without intravenous contrast. COMPARISON:  Plain 01/16/2018. FINDINGS: Urinary Tract:  Thickened bladder wall.  No stones. Bowel:  Unremarkable visualized pelvic bowel loops. Vascular/Lymphatic: No pathologically enlarged lymph nodes. No significant vascular abnormality seen. Aortic and iliofemoral atherosclerosis. Reproductive:  Prostatomegaly. Other:  None Musculoskeletal: No fracture is seen of the pelvis or hips. No dislocation. Degenerative change of the pubic symphysis, with osseous spurring, but specifically, no pubic fracture seen. IMPRESSION: No fracture is seen of the pelvis or LEFT hip. Degenerative changes of the pubic bones with osseous spurring likely account for the plain film appearance, but there is no acute finding. Prostatomegaly with thickened bladder wall.  No pelvic mass. Aortic and iliofemoral atherosclerosis. Electronically Signed   By: Staci Righter M.D.   On: 01/16/2018 14:59    Dg Hip Unilat W Or Wo Pelvis 2-3 Views Left  Result Date: 01/16/2018 CLINICAL DATA:  Pain in LEFT hip which began yesterday. No fall. Popping sound. History of adenocarcinoma. EXAM: DG HIP (WITH OR WITHOUT PELVIS) 2-3V LEFT COMPARISON:  None. FINDINGS: There is no evidence of hip fracture or dislocation. There are  subtle lucencies through the LEFT pubic parasymphyseal region, as well as the LEFT inferior pubic ramus. Correlate clinically for acute insufficiency fractures. No definite bone destruction to suggest pathologic fracture. Advanced degenerative change at L4-5 and L5-S1 IMPRESSION: Subtle lucencies through the LEFT pubis parasymphyseal region as well as LEFT inferior pubic ramus could represent insufficiency fractures. Correlate clinically. No hip fracture or dislocation. Electronically Signed   By: Staci Righter M.D.   On: 01/16/2018 13:17    Procedures Procedures (including critical care time)  Medications Ordered in ED Medications  ketorolac (TORADOL) 30 MG/ML injection 15 mg (15 mg Intramuscular Given 01/16/18 1335)     Initial Impression / Assessment and Plan / ED Course  I have reviewed the triage vital signs and the nursing notes.  Pertinent labs & imaging results that were available during my care of the patient were reviewed by me and considered in my medical decision making (see chart for details).    After reviewing the initial imaging, and reevaluating the patient, particularly with attention to the mid thigh, there is no tenderness palpation, but was concerned for possible hip/pelvis lytic lesions, CT scan will be performed. 3:25 PM On repeat exam, we discussed the x-ray, and CT, both of which I have evaluated. Patient is capable of ambulating, points to tenderness in the adductor muscles of the proximal thigh. There is no deformity with mild ambulation, no falling. We discussed follow-up, analgesia, and absent other complaints, patient discharged with provision of  new analgesic regimen.   Final Clinical Impressions(s) / ED Diagnoses  Left hip pain, initial encounter   Carmin Muskrat, MD 01/16/18 1526

## 2018-01-16 NOTE — ED Triage Notes (Signed)
Pt got up off sofa yesterday and states it popped. Since then he has not been able to bear much weight on it. Is ambulatory with pain.

## 2018-01-19 ENCOUNTER — Other Ambulatory Visit: Payer: Self-pay | Admitting: Cardiovascular Disease

## 2018-01-21 NOTE — Telephone Encounter (Signed)
Please review for refill, Thanks !  

## 2018-02-07 ENCOUNTER — Other Ambulatory Visit: Payer: Self-pay | Admitting: Cardiovascular Disease

## 2018-02-07 MED ORDER — LISINOPRIL 5 MG PO TABS: 2.5000 mg | ORAL_TABLET | Freq: Every day | ORAL | 3 refills | Status: AC

## 2018-02-07 NOTE — Telephone Encounter (Signed)
Lisinopril refill sent to pharmacy 

## 2018-02-07 NOTE — Telephone Encounter (Signed)
°*  STAT* If patient is at the pharmacy, call can be transferred to refill team.   1. Which medications need to be refilled? (please list name of each medication and dose if known)  Need new prescription for Lisinopril 2.5 mg  2. Which pharmacy/location (including street and city if local pharmacy) is medication to be sent to? [facility] [phone]  3. Do they need a 30 day or 90 day supply?  3 months supply

## 2018-03-05 DIAGNOSIS — Z95828 Presence of other vascular implants and grafts: Secondary | ICD-10-CM | POA: Diagnosis not present

## 2018-03-05 DIAGNOSIS — C7800 Secondary malignant neoplasm of unspecified lung: Secondary | ICD-10-CM | POA: Diagnosis not present

## 2018-03-05 DIAGNOSIS — Z452 Encounter for adjustment and management of vascular access device: Secondary | ICD-10-CM | POA: Diagnosis not present

## 2018-03-05 DIAGNOSIS — C19 Malignant neoplasm of rectosigmoid junction: Secondary | ICD-10-CM | POA: Diagnosis not present

## 2018-03-05 DIAGNOSIS — M25552 Pain in left hip: Secondary | ICD-10-CM | POA: Diagnosis not present

## 2018-03-05 DIAGNOSIS — C787 Secondary malignant neoplasm of liver and intrahepatic bile duct: Secondary | ICD-10-CM | POA: Diagnosis not present

## 2018-03-14 DIAGNOSIS — C7800 Secondary malignant neoplasm of unspecified lung: Secondary | ICD-10-CM | POA: Diagnosis not present

## 2018-03-14 DIAGNOSIS — R918 Other nonspecific abnormal finding of lung field: Secondary | ICD-10-CM | POA: Diagnosis not present

## 2018-03-14 DIAGNOSIS — J439 Emphysema, unspecified: Secondary | ICD-10-CM | POA: Diagnosis not present

## 2018-03-14 DIAGNOSIS — I700 Atherosclerosis of aorta: Secondary | ICD-10-CM | POA: Diagnosis not present

## 2018-03-14 DIAGNOSIS — C787 Secondary malignant neoplasm of liver and intrahepatic bile duct: Secondary | ICD-10-CM | POA: Diagnosis not present

## 2018-03-14 DIAGNOSIS — D490 Neoplasm of unspecified behavior of digestive system: Secondary | ICD-10-CM | POA: Diagnosis not present

## 2018-03-15 ENCOUNTER — Inpatient Hospital Stay (HOSPITAL_COMMUNITY)
Admission: EM | Admit: 2018-03-15 | Discharge: 2018-03-18 | DRG: 481 | Disposition: A | Discharge status: Home Health | Payer: Medicare HMO | Attending: Internal Medicine | Admitting: Internal Medicine

## 2018-03-15 ENCOUNTER — Other Ambulatory Visit: Payer: Self-pay

## 2018-03-15 ENCOUNTER — Emergency Department (HOSPITAL_COMMUNITY): Payer: Medicare HMO

## 2018-03-15 ENCOUNTER — Inpatient Hospital Stay (HOSPITAL_COMMUNITY): Payer: Medicare HMO

## 2018-03-15 DIAGNOSIS — K219 Gastro-esophageal reflux disease without esophagitis: Secondary | ICD-10-CM | POA: Diagnosis not present

## 2018-03-15 DIAGNOSIS — Z66 Do not resuscitate: Secondary | ICD-10-CM | POA: Diagnosis present

## 2018-03-15 DIAGNOSIS — Z7902 Long term (current) use of antithrombotics/antiplatelets: Secondary | ICD-10-CM

## 2018-03-15 DIAGNOSIS — E8881 Metabolic syndrome: Secondary | ICD-10-CM | POA: Diagnosis not present

## 2018-03-15 DIAGNOSIS — M25552 Pain in left hip: Secondary | ICD-10-CM | POA: Diagnosis not present

## 2018-03-15 DIAGNOSIS — E785 Hyperlipidemia, unspecified: Secondary | ICD-10-CM | POA: Diagnosis not present

## 2018-03-15 DIAGNOSIS — F17210 Nicotine dependence, cigarettes, uncomplicated: Secondary | ICD-10-CM | POA: Diagnosis present

## 2018-03-15 DIAGNOSIS — S7292XA Unspecified fracture of left femur, initial encounter for closed fracture: Secondary | ICD-10-CM | POA: Diagnosis present

## 2018-03-15 DIAGNOSIS — C169 Malignant neoplasm of stomach, unspecified: Secondary | ICD-10-CM | POA: Diagnosis not present

## 2018-03-15 DIAGNOSIS — C19 Malignant neoplasm of rectosigmoid junction: Secondary | ICD-10-CM | POA: Diagnosis not present

## 2018-03-15 DIAGNOSIS — Z8249 Family history of ischemic heart disease and other diseases of the circulatory system: Secondary | ICD-10-CM

## 2018-03-15 DIAGNOSIS — I2510 Atherosclerotic heart disease of native coronary artery without angina pectoris: Secondary | ICD-10-CM | POA: Diagnosis not present

## 2018-03-15 DIAGNOSIS — S72042A Displaced fracture of base of neck of left femur, initial encounter for closed fracture: Secondary | ICD-10-CM | POA: Diagnosis not present

## 2018-03-15 DIAGNOSIS — Z800 Family history of malignant neoplasm of digestive organs: Secondary | ICD-10-CM

## 2018-03-15 DIAGNOSIS — K449 Diaphragmatic hernia without obstruction or gangrene: Secondary | ICD-10-CM | POA: Diagnosis present

## 2018-03-15 DIAGNOSIS — S72142A Displaced intertrochanteric fracture of left femur, initial encounter for closed fracture: Secondary | ICD-10-CM | POA: Diagnosis not present

## 2018-03-15 DIAGNOSIS — Z7982 Long term (current) use of aspirin: Secondary | ICD-10-CM

## 2018-03-15 DIAGNOSIS — I10 Essential (primary) hypertension: Secondary | ICD-10-CM | POA: Diagnosis present

## 2018-03-15 DIAGNOSIS — R1310 Dysphagia, unspecified: Secondary | ICD-10-CM | POA: Diagnosis present

## 2018-03-15 DIAGNOSIS — M80852A Other osteoporosis with current pathological fracture, left femur, initial encounter for fracture: Secondary | ICD-10-CM | POA: Diagnosis not present

## 2018-03-15 DIAGNOSIS — I252 Old myocardial infarction: Secondary | ICD-10-CM

## 2018-03-15 DIAGNOSIS — S72001A Fracture of unspecified part of neck of right femur, initial encounter for closed fracture: Secondary | ICD-10-CM | POA: Diagnosis not present

## 2018-03-15 DIAGNOSIS — Z91041 Radiographic dye allergy status: Secondary | ICD-10-CM

## 2018-03-15 DIAGNOSIS — Z9841 Cataract extraction status, right eye: Secondary | ICD-10-CM | POA: Diagnosis not present

## 2018-03-15 DIAGNOSIS — Z85038 Personal history of other malignant neoplasm of large intestine: Secondary | ICD-10-CM | POA: Diagnosis not present

## 2018-03-15 DIAGNOSIS — H9190 Unspecified hearing loss, unspecified ear: Secondary | ICD-10-CM | POA: Diagnosis present

## 2018-03-15 DIAGNOSIS — Z9842 Cataract extraction status, left eye: Secondary | ICD-10-CM | POA: Diagnosis not present

## 2018-03-15 DIAGNOSIS — C7889 Secondary malignant neoplasm of other digestive organs: Secondary | ICD-10-CM | POA: Diagnosis not present

## 2018-03-15 DIAGNOSIS — C7800 Secondary malignant neoplasm of unspecified lung: Secondary | ICD-10-CM | POA: Diagnosis not present

## 2018-03-15 DIAGNOSIS — Z8711 Personal history of peptic ulcer disease: Secondary | ICD-10-CM

## 2018-03-15 DIAGNOSIS — Z720 Tobacco use: Secondary | ICD-10-CM | POA: Diagnosis present

## 2018-03-15 DIAGNOSIS — S72002A Fracture of unspecified part of neck of left femur, initial encounter for closed fracture: Secondary | ICD-10-CM | POA: Diagnosis not present

## 2018-03-15 DIAGNOSIS — Z419 Encounter for procedure for purposes other than remedying health state, unspecified: Secondary | ICD-10-CM

## 2018-03-15 LAB — COMPREHENSIVE METABOLIC PANEL
ALBUMIN: 2.8 g/dL — AB (ref 3.5–5.0)
ALK PHOS: 129 U/L — AB (ref 38–126)
ALT: 35 U/L (ref 0–44)
ANION GAP: 10 (ref 5–15)
AST: 25 U/L (ref 15–41)
BUN: 24 mg/dL — ABNORMAL HIGH (ref 8–23)
CO2: 25 mmol/L (ref 22–32)
Calcium: 8.8 mg/dL — ABNORMAL LOW (ref 8.9–10.3)
Chloride: 106 mmol/L (ref 98–111)
Creatinine, Ser: 1.15 mg/dL (ref 0.61–1.24)
GFR calc non Af Amer: >60 mL/min (ref >60)
GLUCOSE: 102 mg/dL — AB (ref 70–99)
POTASSIUM: 3.9 mmol/L (ref 3.5–5.1)
SODIUM: 141 mmol/L (ref 135–145)
Total Bilirubin: 0.2 mg/dL — ABNORMAL LOW (ref 0.3–1.2)
Total Protein: 6.4 g/dL — ABNORMAL LOW (ref 6.5–8.1)

## 2018-03-15 LAB — CBC
HCT: 38.3 % — ABNORMAL LOW (ref 39.0–52.0)
Hemoglobin: 12 g/dL — ABNORMAL LOW (ref 13.0–17.0)
MCH: 28.7 pg (ref 26.0–34.0)
MCHC: 31.3 g/dL (ref 30.0–36.0)
MCV: 91.6 fL (ref 80.0–100.0)
PLATELETS: 303 10*3/uL (ref 150–400)
RBC: 4.18 MIL/uL — ABNORMAL LOW (ref 4.22–5.81)
RDW: 13.2 % (ref 11.5–15.5)
WBC: 15.5 10*3/uL — AB (ref 4.0–10.5)
nRBC: 0 % (ref 0.0–0.2)

## 2018-03-15 LAB — TYPE AND SCREEN
ABO/RH(D): O NEG
ANTIBODY SCREEN: NEGATIVE

## 2018-03-15 LAB — ABO/RH: ABO/RH(D): O NEG

## 2018-03-15 MED ORDER — LISINOPRIL 2.5 MG PO TABS
2.5000 mg | ORAL_TABLET | Freq: Every day | ORAL | Status: DC
  Administered 2018-03-15 – 2018-03-18 (×3): 2.5 mg via ORAL
  Filled 2018-03-15 (×3): qty 1

## 2018-03-15 MED ORDER — EZETIMIBE 10 MG PO TABS: 10.0000 mg | ORAL_TABLET | Freq: Every day | ORAL | Status: DC

## 2018-03-15 MED ORDER — ONDANSETRON HCL 4 MG PO TABS: 4.0000 mg | ORAL_TABLET | Freq: Four times a day (QID) | ORAL | Status: DC | PRN

## 2018-03-15 MED ORDER — ONDANSETRON HCL 4 MG PO TABS: 8.0000 mg | ORAL_TABLET | Freq: Three times a day (TID) | ORAL | Status: DC | PRN

## 2018-03-15 MED ORDER — ATORVASTATIN CALCIUM 40 MG PO TABS
40.0000 mg | ORAL_TABLET | Freq: Every day | ORAL | Status: DC
  Administered 2018-03-15 – 2018-03-18 (×4): 40 mg via ORAL
  Filled 2018-03-15 (×4): qty 1

## 2018-03-15 MED ORDER — SODIUM CHLORIDE 0.9 % IV SOLN
INTRAVENOUS | Status: DC
  Administered 2018-03-15 – 2018-03-16 (×2): via INTRAVENOUS

## 2018-03-15 MED ORDER — HEPARIN SODIUM (PORCINE) 5000 UNIT/ML IJ SOLN
5000.0000 [IU] | Freq: Three times a day (TID) | INTRAMUSCULAR | Status: DC
  Administered 2018-03-15: 5000 [IU] via SUBCUTANEOUS
  Filled 2018-03-15: qty 1

## 2018-03-15 MED ORDER — PANTOPRAZOLE SODIUM 40 MG PO TBEC
40.0000 mg | DELAYED_RELEASE_TABLET | Freq: Every day | ORAL | Status: DC
  Administered 2018-03-15 – 2018-03-18 (×4): 40 mg via ORAL
  Filled 2018-03-15 (×4): qty 1

## 2018-03-15 MED ORDER — ACETAMINOPHEN 650 MG RE SUPP: 650.0000 mg | Freq: Four times a day (QID) | RECTAL | Status: DC | PRN

## 2018-03-15 MED ORDER — MORPHINE SULFATE (PF) 2 MG/ML IV SOLN
2.0000 mg | INTRAVENOUS | Status: DC | PRN
  Administered 2018-03-15 – 2018-03-16 (×2): 2 mg via INTRAVENOUS
  Filled 2018-03-15 (×2): qty 1

## 2018-03-15 MED ORDER — PROCHLORPERAZINE MALEATE 10 MG PO TABS
10.0000 mg | ORAL_TABLET | Freq: Four times a day (QID) | ORAL | Status: DC | PRN
  Filled 2018-03-15: qty 1

## 2018-03-15 MED ORDER — MORPHINE SULFATE (PF) 4 MG/ML IV SOLN
4.0000 mg | Freq: Once | INTRAVENOUS | Status: AC
  Administered 2018-03-15: 4 mg via INTRAVENOUS
  Filled 2018-03-15: qty 1

## 2018-03-15 MED ORDER — DIPHENOXYLATE-ATROPINE 2.5-0.025 MG PO TABS: 1.0000 | ORAL_TABLET | Freq: Four times a day (QID) | ORAL | Status: DC | PRN

## 2018-03-15 MED ORDER — ONDANSETRON HCL 4 MG/2ML IJ SOLN: 4.0000 mg | Freq: Four times a day (QID) | INTRAMUSCULAR | Status: DC | PRN

## 2018-03-15 MED ORDER — ACETAMINOPHEN 325 MG PO TABS: 650.0000 mg | ORAL_TABLET | Freq: Four times a day (QID) | ORAL | Status: DC | PRN

## 2018-03-15 MED ORDER — TRAMADOL HCL 50 MG PO TABS
50.0000 mg | ORAL_TABLET | Freq: Four times a day (QID) | ORAL | Status: DC | PRN
  Administered 2018-03-17: 50 mg via ORAL
  Filled 2018-03-15: qty 1

## 2018-03-15 MED ORDER — EZETIMIBE 10 MG PO TABS
10.0000 mg | ORAL_TABLET | Freq: Every day | ORAL | Status: DC
  Administered 2018-03-15 – 2018-03-18 (×4): 10 mg via ORAL
  Filled 2018-03-15 (×4): qty 1

## 2018-03-15 MED ORDER — ACETAMINOPHEN 325 MG PO TABS: 650.0000 mg | ORAL_TABLET | Freq: Every day | ORAL | Status: DC | PRN

## 2018-03-15 NOTE — ED Triage Notes (Signed)
Pt here from [place] after having ct of his left hip , pt was told to come to the ED due to a fx of that hip

## 2018-03-15 NOTE — H&P (Signed)
History and Physical   Steven Drake WGY:[id number] DOB: Mar 19, 1945 DOA: 03/15/2018  Referring MD/NP/PA: Dr. Tomi Bamberger  PCP: Dettinger, Fransisca Kaufmann, MD   Outpatient Specialists: None  Patient coming from: Home  Chief Complaint: Left hip pain  [name] is a 73 y.o. male with medical history significant of stomach cancer, metastatic colon cancer stage IV, hypertension, hyperlipidemia, coronary artery disease, pulmonary nodule suspected to be carcinoma who presents to the ER with left hip pain that has been persistent for the last few days.  Patient originally had his left foot caught under the coffee table back in November.  It got twisted and he had a pop in the left hip area.  Was having pain at the time and was x-rayed.  He even had a CT done at that point with no evidence of fracture over at [facility].  He continues to have pain but in the last few days it has gotten worse.  Patient went to [facility] where his another scan was done today showing left femoral neck fracture.  He was asked to get to the ER where he came here today.  Orthopedics has been consulted and patient is scheduled for surgery tomorrow.  No evidence of pathological fracture or metastasis to the bone.  ED Course: Temperature is 98.3 blood pressure is 182/78 pulse 66 respiratory of 18 oxygen sat 97% on room air.  White count 15.5 hemoglobin 12.1 platelet count of 303.  Sodium is 141 potassium 3.9 chloride 106 CO2 25 BUN 24 creatinine 1.15 and calcium 8.8 glucose is 102.  Chest x-ray showed progressive pulmonary metastatic disease, Plain x-ray of the left hip showed displacedfemoral neck fracture.  Review of Systems: As per HPI otherwise 10 point review of systems negative.    Past Medical History:  Diagnosis Date  . Adenocarcinoma of cecum ([place]) 07/01/2015  . 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 CAD (coronary artery disease) RCA non dominant vessel, LAD 40%, 80-90% 2nd diag EF 55%   01/19/2013   Dr. Claiborne Billings [place] 03-05-15 Epic.  . Family history of colon cancer   . Family history of stomach cancer   . Gastric adenocarcinoma ([place]) 06/29/2015  . Gastric ulcer    many yrs ago  . GERD (gastroesophageal reflux disease)   . Hyperlipidemia LDL goal < 70 01/19/2013  . Hypertension   . Hypokalemia 01/20/2013  . Metabolic syndrome, with mildly elevated HgBA1C 01/20/2013  . Pulmonary nodules 01/19/2013  . STEMI (ST elevation myocardial infarction), 01/19/13 01/19/2013  . Tobacco abuse 01/20/2013  . Transfusion history    with gastric ulcer- many yrs ago    Past Surgical History:  Procedure Laterality Date  . APPENDECTOMY    . BIOPSY  10/05/2016   Procedure: BIOPSY;  Surgeon: Daneil Dolin, MD;  Location: [facility];  Service: Endoscopy;;  sigmoid anastomosis  . CATARACT EXTRACTION, BILATERAL    . COLONOSCOPY N/A 06/10/2015   RMR: cecal neoplasm  ? biopsied. large polyp at the hepatic flexure removed with piecmeal polypectomy and APC ablation. lesion tattooed. Large polyp in the mid sigmoid status post piecemeal hot snare debulking and tattooing. this lesion not completely removed. scatterd pancolonic diverticulsosis.   Marland Kitchen ESOPHAGOGASTRODUODENOSCOPY N/A 06/10/2015   Procedure: ESOPHAGOGASTRODUODENOSCOPY (EGD);  Surgeon: Daneil Dolin, MD;  Location: [facility];  Service: Endoscopy;  Laterality: N/A;  . ESOPHAGOGASTRODUODENOSCOPY N/A 06/17/2015   RMR: normal esophagus small hiatal hernia. Abnormal nodular antrum .pyloric channel status post biopsy.   Marland Kitchen  FLEXIBLE SIGMOIDOSCOPY N/A 10/05/2016   Dr. Gala Romney: stenotic sigmoid ileal anastomosis with abnormal appearing mucosa with appearance like scar tissue, s/p biopsy. biopsies with colonic mucosa and patchy ulceration, acute and chronic inflammation.   Marland Kitchen HERNIA REPAIR Right   . LAPAROSCOPIC SUBTOTAL COLECTOMY N/A 08/02/2015   Procedure: LAPAROSCOPIC SUBTOTAL COLECTOMY  AND DISTAL GASTRECTOMY ;  Surgeon: Stark Klein, MD;  Location: [facility]  ORS;  Service: General;  Laterality: N/A;  . LEFT HEART CATH N/A 01/19/2013   Procedure: LEFT HEART CATH;  Surgeon: Troy Sine, MD;  Location: [facility] CATH LAB;  Service: Cardiovascular;  Laterality: N/A;     reports that he has been smoking cigarettes. He started smoking about 55 years ago. He has a 58.00 pack-year smoking history. He has never used smokeless tobacco. He reports that he does not drink alcohol or use drugs.  Allergies  Allergen Reactions  . Contrast Media [Iodinated Diagnostic Agents] Itching and Rash    Family History  Problem Relation Age of Onset  . Heart attack Mother   . Colon cancer Father        dx in his 4s  . Colon cancer Sister        dx possibly in her early 13s  . Stomach cancer Sister        dx in her 51s  . Colon cancer Paternal Uncle      Prior to Admission medications   Medication Sig Start Date End Date Taking? Authorizing Provider  acetaminophen (TYLENOL) 325 MG tablet Take 650 mg by mouth daily as needed for moderate pain or headache.    [provider]  aspirin 81 MG EC tablet Take 81 mg by mouth daily. 11/24/15   [provider]  atorvastatin (LIPITOR) 40 MG tablet Take 1 tablet (40 mg total) by mouth daily. 10/31/17   Dettinger, Fransisca Kaufmann, MD  clopidogrel (PLAVIX) 75 MG tablet TAKE 1 TABLET BY MOUTH DAILY WITH BREAKFEST 01/21/18   Wellington Hampshire, MD  diphenoxylate-atropine (LOMOTIL) 2.5-0.025 MG tablet TAKE 1 TO 2 TABLETS 4 TIMES DAILY AS NEEDED FOR DIARRHEA 10/08/17   Annitta Needs, NP  ezetimibe (ZETIA) 10 MG tablet Take 1 tablet (10 mg total) by mouth daily. 07/13/17 10/11/17  Troy Sine, MD  ezetimibe (ZETIA) 10 MG tablet Take 10 mg by mouth daily. 12/30/17   [provider]  lidocaine-prilocaine (EMLA) cream APPLY A QUARTER SIZE AMOUNT TO PORT SITE 1 HOUR PRIOR TO CHEMO. DO NOT RUB IN. COVER WITH PLASTIC WRAP. 09/04/15   [provider]  lisinopril (PRINIVIL,ZESTRIL) 5 MG tablet Take 0.5 tablets (2.5 mg  total) by mouth daily. 02/07/18   Troy Sine, MD  ondansetron (ZOFRAN) 8 MG tablet Take 1 tablet (8 mg total) by mouth every 8 (eight) hours as needed for nausea or vomiting. 09/04/15   Penland, Kelby Fam, MD  pantoprazole (PROTONIX) 40 MG tablet TAKE 1 TABLET BY MOUTH DAILY AT 6:00 A.M. 10/31/17   Dettinger, Fransisca Kaufmann, MD  prochlorperazine (COMPAZINE) 10 MG tablet Take 1 tablet (10 mg total) by mouth every 6 (six) hours as needed for nausea or vomiting. 09/04/15   Penland, Kelby Fam, MD  traMADol (ULTRAM) 50 MG tablet Take 1 tablet (50 mg total) by mouth every 6 (six) hours as needed. 01/16/18   Carmin Muskrat, MD    Physical Exam: Vitals:   03/15/18 1639 03/15/18 1928 03/15/18 2054  BP: (!) 150/63 (!) 182/78 (!) 166/70  Pulse: 62 66 63  Resp: 16  18 18  Temp: 98 F (36.7 C)  98.3 F (36.8 C)  TempSrc: Oral  Oral  SpO2: 97% 100% 100%      Constitutional: NAD, calm, comfortable Vitals:   03/15/18 1639 03/15/18 1928 03/15/18 2054  BP: (!) 150/63 (!) 182/78 (!) 166/70  Pulse: 62 66 63  Resp: 16 18 18   Temp: 98 F (36.7 C)  98.3 F (36.8 C)  TempSrc: Oral  Oral  SpO2: 97% 100% 100%   Eyes: PERRL, lids and conjunctivae normal ENMT: Mucous membranes are moist. Posterior pharynx clear of any exudate or lesions.Normal dentition.  Neck: normal, supple, no masses, no thyromegaly Respiratory: clear to auscultation bilaterally, no wheezing, no crackles. Normal respiratory effort. No accessory muscle use.  Cardiovascular: Regular rate and rhythm, no murmurs / rubs / gallops. No extremity edema. 2+ pedal pulses. No carotid bruits.  Abdomen: no tenderness, no masses palpated. No hepatosplenomegaly. Bowel sounds positive.  Musculoskeletal: no clubbing / cyanosis. No joint deformity upper and lower extremities.  Laterally rotated left lower extremity. Normal muscle tone.  Skin: no rashes, lesions, ulcers. No induration Neurologic: CN 2-12 grossly intact. Sensation intact, DTR normal.  Strength 5/5 in all 4.  Psychiatric: Normal judgment and insight. Alert and oriented x 3. Normal mood.     Labs on Admission: I have personally reviewed following labs and imaging studies  CBC: Recent Labs  Lab 03/15/18 1645  WBC 15.5*  HGB 12.0*  HCT 38.3*  MCV 91.6  PLT 161   Basic Metabolic Panel: Recent Labs  Lab 03/15/18 1645  NA 141  K 3.9  CL 106  CO2 25  GLUCOSE 102*  BUN 24*  CREATININE 1.15  CALCIUM 8.8*   GFR: CrCl cannot be calculated (Unknown ideal weight.). Liver Function Tests: Recent Labs  Lab 03/15/18 1645  AST 25  ALT 35  ALKPHOS 129*  BILITOT 0.2*  PROT 6.4*  ALBUMIN 2.8*   No results for input(s): LIPASE, AMYLASE in the last 168 hours. No results for input(s): AMMONIA in the last 168 hours. Coagulation Profile: No results for input(s): INR, PROTIME in the last 168 hours. Cardiac Enzymes: No results for input(s): CKTOTAL, CKMB, CKMBINDEX, TROPONINI in the last 168 hours. BNP (last 3 results) No results for input(s): PROBNP in the last 8760 hours. HbA1C: No results for input(s): HGBA1C in the last 72 hours. CBG: No results for input(s): GLUCAP in the last 168 hours. Lipid Profile: No results for input(s): CHOL, HDL, LDLCALC, TRIG, CHOLHDL, LDLDIRECT in the last 72 hours. Thyroid Function Tests: No results for input(s): TSH, T4TOTAL, FREET4, T3FREE, THYROIDAB in the last 72 hours. Anemia Panel: No results for input(s): VITAMINB12, FOLATE, FERRITIN, TIBC, IRON, RETICCTPCT in the last 72 hours. Urine analysis: No results found for: COLORURINE, APPEARANCEUR, LABSPEC, PHURINE, GLUCOSEU, HGBUR, BILIRUBINUR, KETONESUR, PROTEINUR, UROBILINOGEN, NITRITE, LEUKOCYTESUR Sepsis Labs: @LABRCNTIP (procalcitonin:4,lacticidven:4) )No results found for this or any previous visit (from the past 240 hour(s)).   Radiological Exams on Admission: Dg Chest 2 View  Result Date: 03/15/2018 CLINICAL DATA:  Left hip pain first weeks, difficulty walking,  injury getting above a couch at home, felt a pop in LEFT hip; history of adenocarcinoma of the rectosigmoid junction, gastric adenocarcinoma, smoker, coronary artery disease post MI, GERD EXAM: CHEST - 2 VIEW COMPARISON:  PET-CT 03/26/2017 FINDINGS: RIGHT jugular Port-A-Cath with tip projecting over cavoatrial junction. Normal heart size, mediastinal contours, and pulmonary vascularity. Atherosclerotic calcifications aorta. Multiple pulmonary nodules are identified, largest lateral mid RIGHT lung 3.5 x 3.0 cm increased  from previous exam. Additional LEFT perihilar/hilar nodule 3.0 x 2.7 cm, increased from previous exam. Findings are consistent with progressive pulmonary metastatic disease. Increased number of pulmonary nodule since prior study. No acute infiltrate, pleural effusion or pneumothorax. Degenerative changes of the shoulder joints bilaterally. IMPRESSION: Progressive pulmonary metastatic disease since 03/26/2017. Electronically Signed   By: Lavonia Dana M.D.   On: 03/15/2018 17:42   Dg Hip Unilat W Or Wo Pelvis 2-3 Views Left  Result Date: 03/15/2018 CLINICAL DATA:  Left hip pain. EXAM: DG HIP (WITH OR WITHOUT PELVIS) 2-3V LEFT COMPARISON:  January 16, 2018 FINDINGS: There is displaced fracture of the left femoral neck. No other acute fracture dislocation is identified. IMPRESSION: Displaced fracture of left femoral neck. Electronically Signed   By: Abelardo Diesel M.D.   On: 03/15/2018 20:37    EKG: Independently reviewed. Showed normal sinus rhythm with a rate of 65.  Evidence of LVH with no ST findings.  Assessment/Plan Principal Problem:   Closed left femoral fracture (HCC) Active Problems:   Hyperlipidemia with target LDL less than 70   Tobacco abuse   CAD in native artery   Gastric adenocarcinoma (HCC)   Adenocarcinoma of rectosigmoid junction (HCC)   Hypertension     #1 closed left femoral neck fracture: Patient appears to be in pain with movement.  Orthopedic consulted.  He is  scheduled to have surgery in the morning.  Medically appears hemodynamically stable.  The fracture appears to be chronic probably since November.  It may have been hairline back pain were nondisplaced but with mobility and increased pressure now is displaced.  Will defer to orthopedics for treatment.  #2 metastatic gastric and colon cancer: Patient has undergone surgery and chemotherapy and supportive care.  At this point he is mainly supportive care.  He has 6 to 18 months to live according to him.  Not on any active treatment at the moment.  #3 tobacco abuse: Nicotine patch offered to the patient.  #4 hypertension: We will continue home regimen at this point.  #5 coronary artery disease: No recent cardiac events.  Patient is cleared for surgery.   DVT prophylaxis: Heparin Code Status: DNR Family Communication: Wife at bed side Disposition Plan: To be determined Consults called: Dr. Percell Miller of orthopedics  Admission status: inpatient   Severity of Illness: The appropriate patient status for this patient is INPATIENT. Inpatient status is judged to be reasonable and necessary in order to provide the required intensity of service to ensure the patient's safety. The patient's presenting symptoms, physical exam findings, and initial radiographic and laboratory data in the context of their chronic comorbidities is felt to place them at high risk for further clinical deterioration. Furthermore, it is not anticipated that the patient will be medically stable for discharge from the hospital within 2 midnights of admission. The following factors support the patient status of inpatient.   " The patient's presenting symptoms include left hip pain. " The worrisome physical exam findings include laterally rotated left lower extremity. " The initial radiographic and laboratory data are worrisome because of evidence of left hip fracture on x-ray and CT. " The chronic co-morbidities include metastatic GI  cancer.   * I certify that at the point of admission it is my clinical judgment that the patient will require inpatient hospital care spanning beyond 2 midnights from the point of admission due to high intensity of service, high risk for further deterioration and high frequency of surveillance required.Barbette Merino  MD [facility] Pager [phone]  If 7PM-7AM, please contact night-coverage [email or web address] Password [facility]  03/15/2018, 11:36 PM

## 2018-03-15 NOTE — ED Provider Notes (Signed)
[facility] EMERGENCY DEPARTMENT Provider Note   CSN: [id number] Arrival date & time: 03/15/18  1552     History   Chief Complaint Chief Complaint  Patient presents with  . Hip Pain    HPI Steven Drake is a 73 y.o. male.  HPI Patient presents to the emergency room for evaluation of a hip fracture.  Patient states he initially developed pain in his hip back in November.  Patient was doing some activity and felt a sharp pain suddenly in his left hip.  Patient did have having an x-ray as well as a CT scan at any [facility].  Patient was able to ambulate and he was discharged.  Patient states ever since then he has had continuous pain in his left hip.  Patient had a CT scan of his chest abdomen pelvis ordered by his oncologist at [facility].  Patient does have a history of colon cancer and is currently not undergoing any chemotherapy.  The CT scan demonstrated a fracture in his left hip.  Patient was told to come to the emergency room to see an orthopedic doctor. Past Medical History:  Diagnosis Date  . Adenocarcinoma of cecum ([place]) 07/01/2015  . 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 CAD (coronary artery disease) RCA non dominant vessel, LAD 40%, 80-90% 2nd diag EF 55%  01/19/2013   Dr. Claiborne Billings [place] 03-05-15 Epic.  . Family history of colon cancer   . Family history of stomach cancer   . Gastric adenocarcinoma ([place]) 06/29/2015  . Gastric ulcer    many yrs ago  . GERD (gastroesophageal reflux disease)   . Hyperlipidemia LDL goal < 70 01/19/2013  . Hypertension   . Hypokalemia 01/20/2013  . Metabolic syndrome, with mildly elevated HgBA1C 01/20/2013  . Pulmonary nodules 01/19/2013  . STEMI (ST elevation myocardial infarction), 01/19/13 01/19/2013  . Tobacco abuse 01/20/2013  . Transfusion history    with gastric ulcer- many yrs ago    Patient Active Problem List   Diagnosis Date Noted  . Hypertension 04/27/2017  . Iron deficiency anemia 03/03/2016   . Colon cancer ([place]) 01/24/2016  . Gastric cancer ([place]) 01/24/2016  . Family history of colon cancer   . Family history of stomach cancer   . 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 Adenocarcinoma of cecum ([place]) 07/01/2015  . Gastric adenocarcinoma ([place]) 06/29/2015  . History of colonic polyps   . Mucosal abnormality of stomach   . Dysphagia   . Hiatal hernia   . Schatzki's ring   . Heme + stool 05/20/2015  . Anemia 05/20/2015  . Enlarged prostate on rectal examination 05/04/2015  . Hip discomfort 03/05/2015  . CAD in native artery 08/27/2014  . Hypokalemia-replaced 01/20/2013  . Tobacco abuse 01/20/2013  . Metabolic syndrome, with mildly elevated HgBA1C 01/20/2013  . Non-STEMI (non-ST elevated myocardial infarction), involving RCA/ LAD-diag  01/19/2013  . CAD (coronary artery disease) RCA non dominant vessel, LAD 50-60%, 80-90% 2nd diag EF 55%  01/19/2013  . Hyperlipidemia with target LDL less than 70 01/19/2013  . Pulmonary nodules 01/19/2013    Past Surgical History:  Procedure Laterality Date  . APPENDECTOMY    . BIOPSY  10/05/2016   Procedure: BIOPSY;  Surgeon: Daneil Dolin, MD;  Location: [facility] END[facility];  Service: Endoscopy;;  sigmoid anastomosis  . CATARACT EXTRACTION, BILATERAL    . COLONOSCOPY N/A 06/10/2015   RMR: cecal neoplasm  ? biopsied. large polyp at the hepatic flexure removed with  piecmeal polypectomy and APC ablation. lesion tattooed. Large polyp in the mid sigmoid status post piecemeal hot snare debulking and tattooing. this lesion not completely removed. scatterd pancolonic diverticulsosis.   Marland Kitchen ESOPHAGOGASTRODUODENOSCOPY N/A 06/10/2015   Procedure: ESOPHAGOGASTRODUODENOSCOPY (EGD);  Surgeon: Daneil Dolin, MD;  Location: [facility];  Service: Endoscopy;  Laterality: N/A;  . ESOPHAGOGASTRODUODENOSCOPY N/A 06/17/2015   RMR: normal esophagus small hiatal hernia. Abnormal nodular antrum .pyloric channel status post biopsy.   Marland Kitchen FLEXIBLE  SIGMOIDOSCOPY N/A 10/05/2016   Dr. Gala Romney: stenotic sigmoid ileal anastomosis with abnormal appearing mucosa with appearance like scar tissue, s/p biopsy. biopsies with colonic mucosa and patchy ulceration, acute and chronic inflammation.   Marland Kitchen HERNIA REPAIR Right   . LAPAROSCOPIC SUBTOTAL COLECTOMY N/A 08/02/2015   Procedure: LAPAROSCOPIC SUBTOTAL COLECTOMY  AND DISTAL GASTRECTOMY ;  Surgeon: Stark Klein, MD;  Location: [facility] ORS;  Service: General;  Laterality: N/A;  . LEFT HEART CATH N/A 01/19/2013   Procedure: LEFT HEART CATH;  Surgeon: Troy Sine, MD;  Location: [facility] CATH LAB;  Service: Cardiovascular;  Laterality: N/A;        Home Medications    Prior to Admission medications   Medication Sig Start Date End Date Taking? Authorizing Provider  acetaminophen (TYLENOL) 325 MG tablet Take 650 mg by mouth daily as needed for moderate pain or headache.    [provider]  aspirin 81 MG EC tablet Take 81 mg by mouth daily. 11/24/15   [provider]  atorvastatin (LIPITOR) 40 MG tablet Take 1 tablet (40 mg total) by mouth daily. 10/31/17   Dettinger, Fransisca Kaufmann, MD  clopidogrel (PLAVIX) 75 MG tablet TAKE 1 TABLET BY MOUTH DAILY WITH BREAKFEST 01/21/18   Wellington Hampshire, MD  diphenoxylate-atropine (LOMOTIL) 2.5-0.025 MG tablet TAKE 1 TO 2 TABLETS 4 TIMES DAILY AS NEEDED FOR DIARRHEA 10/08/17   Annitta Needs, NP  ezetimibe (ZETIA) 10 MG tablet Take 1 tablet (10 mg total) by mouth daily. 07/13/17 10/11/17  Troy Sine, MD  ezetimibe (ZETIA) 10 MG tablet Take 10 mg by mouth daily. 12/30/17   [provider]  lidocaine-prilocaine (EMLA) cream APPLY A QUARTER SIZE AMOUNT TO PORT SITE 1 HOUR PRIOR TO CHEMO. DO NOT RUB IN. COVER WITH PLASTIC WRAP. 09/04/15   [provider]  lisinopril (PRINIVIL,ZESTRIL) 5 MG tablet Take 0.5 tablets (2.5 mg total) by mouth daily. 02/07/18   Troy Sine, MD  ondansetron (ZOFRAN) 8 MG tablet Take 1 tablet (8 mg total) by mouth every 8 (eight)  hours as needed for nausea or vomiting. 09/04/15   Penland, Kelby Fam, MD  pantoprazole (PROTONIX) 40 MG tablet TAKE 1 TABLET BY MOUTH DAILY AT 6:00 A.M. 10/31/17   Dettinger, Fransisca Kaufmann, MD  prochlorperazine (COMPAZINE) 10 MG tablet Take 1 tablet (10 mg total) by mouth every 6 (six) hours as needed for nausea or vomiting. 09/04/15   Penland, Kelby Fam, MD  traMADol (ULTRAM) 50 MG tablet Take 1 tablet (50 mg total) by mouth every 6 (six) hours as needed. 01/16/18   Carmin Muskrat, MD    Family History Family History  Problem Relation Age of Onset  . Heart attack Mother   . Colon cancer Father        dx in his 74s  . Colon cancer Sister        dx possibly in her early 15s  . Stomach cancer Sister        dx in her 65s  . Colon  cancer Paternal Uncle     Social History Social History   Tobacco Use  . Smoking status: Current Every Day Smoker    Packs/day: 1.00    Years: 58.00    Pack years: 58.00    Types: Cigarettes    Start date: 01/21/1963  . Smokeless tobacco: Never Used  . Tobacco comment: one pack daily  Substance Use Topics  . Alcohol use: No    Alcohol/week: 0.0 standard drinks    Comment: none in 15 yrs -heavy user-none now.  . Drug use: No     Allergies   Contrast media [iodinated diagnostic agents]   Review of Systems Review of Systems  All other systems reviewed and are negative.    Physical Exam Updated Vital Signs BP (!) 182/78   Pulse 66   Temp 98 F (36.7 C) (Oral)   Resp 18   SpO2 100%   Physical Exam Vitals signs and nursing note reviewed.  Constitutional:      General: He is not in acute distress.    Appearance: He is well-developed.  HENT:     Head: Normocephalic and atraumatic.     Right Ear: External ear normal.     Left Ear: External ear normal.  Eyes:     General: No scleral icterus.       Right eye: No discharge.        Left eye: No discharge.     Conjunctiva/sclera: Conjunctivae normal.  Neck:     Musculoskeletal: Neck supple.      Trachea: No tracheal deviation.  Cardiovascular:     Rate and Rhythm: Normal rate and regular rhythm.  Pulmonary:     Effort: Pulmonary effort is normal. No respiratory distress.     Breath sounds: Normal breath sounds. No stridor. No wheezing or rales.  Abdominal:     General: Bowel sounds are normal. There is no distension.     Palpations: Abdomen is soft.     Tenderness: There is no abdominal tenderness. There is no guarding or rebound.  Musculoskeletal:     Left hip: He exhibits tenderness.  Skin:    General: Skin is warm and dry.     Findings: No rash.  Neurological:     Mental Status: He is alert.     Cranial Nerves: No cranial nerve deficit (no facial droop, extraocular movements intact, no slurred speech).     Sensory: No sensory deficit.     Motor: No abnormal muscle tone or seizure activity.     Coordination: Coordination normal.      ED Treatments / Results  Labs (all labs ordered are listed, but only abnormal results are displayed) Labs Reviewed  CBC - Abnormal; Notable for the following components:      Result Value   WBC 15.5 (*)    RBC 4.18 (*)    Hemoglobin 12.0 (*)    HCT 38.3 (*)    All other components within normal limits  COMPREHENSIVE METABOLIC PANEL - Abnormal; Notable for the following components:   Glucose, Bld 102 (*)    BUN 24 (*)    Calcium 8.8 (*)    Total Protein 6.4 (*)    Albumin 2.8 (*)    Alkaline Phosphatase 129 (*)    Total Bilirubin 0.2 (*)    All other components within normal limits  TYPE AND SCREEN  ABO/RH    EKG EKG Interpretation  Date/Time:  Friday March 15 2018 16:54:52 EST Ventricular Rate:  65 PR  Interval:  156 QRS Duration: 94 QT Interval:  406 QTC Calculation: 422 R Axis:   57 Text Interpretation:  Normal sinus rhythm Normal ECG Confirmed by Dorie Rank [phone]) on 03/15/2018 7:37:19 PM   Radiology Dg Chest 2 View  Result Date: 03/15/2018 CLINICAL DATA:  Left hip pain first weeks, difficulty walking,  injury getting above a couch at home, felt a pop in LEFT hip; history of adenocarcinoma of the rectosigmoid junction, gastric adenocarcinoma, smoker, coronary artery disease post MI, GERD EXAM: CHEST - 2 VIEW COMPARISON:  PET-CT 03/26/2017 FINDINGS: RIGHT jugular Port-A-Cath with tip projecting over cavoatrial junction. Normal heart size, mediastinal contours, and pulmonary vascularity. Atherosclerotic calcifications aorta. Multiple pulmonary nodules are identified, largest lateral mid RIGHT lung 3.5 x 3.0 cm increased from previous exam. Additional LEFT perihilar/hilar nodule 3.0 x 2.7 cm, increased from previous exam. Findings are consistent with progressive pulmonary metastatic disease. Increased number of pulmonary nodule since prior study. No acute infiltrate, pleural effusion or pneumothorax. Degenerative changes of the shoulder joints bilaterally. IMPRESSION: Progressive pulmonary metastatic disease since 03/26/2017. Electronically Signed   By: Lavonia Dana M.D.   On: 03/15/2018 17:42    Procedures Procedures (including critical care time)  Medications Ordered in ED Medications  morphine 4 MG/ML injection 4 mg (has no administration in time range)     Initial Impression / Assessment and Plan / ED Course  I have reviewed the triage vital signs and the nursing notes.  Pertinent labs & imaging results that were available during my care of the patient were reviewed by me and considered in my medical decision making (see chart for details).  Clinical Course as of Mar 15 1936  Fri Mar 15, 2018  1923 Pt had a CT scan on 03/15/2018 at another facility.  Per care everywhere.  There is a mildly impacted nondisplaced fracture involving the left femoral neck. No definite osseous metastatic disease   [JK]    Clinical Course User Index [JK] Dorie Rank, MD    Patient presented to the emergency room for treatment of a hip fracture.  His outpatient CT scan demonstrates a left hip fracture.  Patient  also appears to have possible metastatic disease.  According to the CT scan there was no findings to suggest a malignant fracture.  Patient is otherwise medically stable at this point.  I will consult with orthopedic surgery and the hospitalist service.  Final Clinical Impressions(s) / ED Diagnoses   Final diagnoses:  Closed fracture of left hip, initial encounter [facility])      Dorie Rank, MD 03/15/18 [phone]

## 2018-03-16 ENCOUNTER — Encounter (HOSPITAL_COMMUNITY): Payer: Self-pay | Admitting: Certified Registered"

## 2018-03-16 ENCOUNTER — Inpatient Hospital Stay (HOSPITAL_COMMUNITY): Payer: Medicare HMO

## 2018-03-16 ENCOUNTER — Inpatient Hospital Stay (HOSPITAL_COMMUNITY): Payer: Medicare HMO | Admitting: Certified Registered"

## 2018-03-16 ENCOUNTER — Encounter (HOSPITAL_COMMUNITY)
Admission: EM | Disposition: A | Discharge status: Home Health | Payer: Self-pay | Source: Home / Self Care | Attending: Internal Medicine

## 2018-03-16 DIAGNOSIS — I10 Essential (primary) hypertension: Secondary | ICD-10-CM

## 2018-03-16 DIAGNOSIS — S72002A Fracture of unspecified part of neck of left femur, initial encounter for closed fracture: Secondary | ICD-10-CM

## 2018-03-16 HISTORY — PX: HIP PINNING,CANNULATED: SHX1758

## 2018-03-16 LAB — COMPREHENSIVE METABOLIC PANEL
ALT: 27 U/L (ref 0–44)
AST: 17 U/L (ref 15–41)
Albumin: 2.4 g/dL — ABNORMAL LOW (ref 3.5–5.0)
Alkaline Phosphatase: 120 U/L (ref 38–126)
Anion gap: 11 (ref 5–15)
BUN: 22 mg/dL (ref 8–23)
CALCIUM: 8.4 mg/dL — AB (ref 8.9–10.3)
CO2: 24 mmol/L (ref 22–32)
Chloride: 105 mmol/L (ref 98–111)
Creatinine, Ser: 1.06 mg/dL (ref 0.61–1.24)
GFR calc Af Amer: >60 mL/min (ref >60)
GFR calc non Af Amer: >60 mL/min (ref >60)
Glucose, Bld: 96 mg/dL (ref 70–99)
Potassium: 4.3 mmol/L (ref 3.5–5.1)
SODIUM: 140 mmol/L (ref 135–145)
Total Bilirubin: 0.4 mg/dL (ref 0.3–1.2)
Total Protein: 5.5 g/dL — ABNORMAL LOW (ref 6.5–8.1)

## 2018-03-16 LAB — CBC
HCT: 34.4 % — ABNORMAL LOW (ref 39.0–52.0)
Hemoglobin: 10.7 g/dL — ABNORMAL LOW (ref 13.0–17.0)
MCH: 28.5 pg (ref 26.0–34.0)
MCHC: 31.1 g/dL (ref 30.0–36.0)
MCV: 91.7 fL (ref 80.0–100.0)
Platelets: 265 10*3/uL (ref 150–400)
RBC: 3.75 MIL/uL — AB (ref 4.22–5.81)
RDW: 13.4 % (ref 11.5–15.5)
WBC: 11.5 10*3/uL — ABNORMAL HIGH (ref 4.0–10.5)
nRBC: 0 % (ref 0.0–0.2)

## 2018-03-16 LAB — MRSA PCR SCREENING: MRSA by PCR: NEGATIVE

## 2018-03-16 SURGERY — FIXATION, FEMUR, NECK, PERCUTANEOUS, USING SCREW
Anesthesia: General | Site: Hip | Laterality: Left

## 2018-03-16 MED ORDER — ASPIRIN EC 81 MG PO TBEC
81.0000 mg | DELAYED_RELEASE_TABLET | Freq: Every day | ORAL | Status: DC
  Administered 2018-03-17 – 2018-03-18 (×2): 81 mg via ORAL
  Filled 2018-03-16 (×2): qty 1

## 2018-03-16 MED ORDER — POLYETHYLENE GLYCOL 3350 17 G PO PACK: 17.0000 g | PACK | Freq: Every day | ORAL | Status: DC | PRN

## 2018-03-16 MED ORDER — FLEET ENEMA 7-19 GM/118ML RE ENEM: 1.0000 | ENEMA | Freq: Once | RECTAL | Status: DC | PRN

## 2018-03-16 MED ORDER — DOCUSATE SODIUM 100 MG PO CAPS
100.0000 mg | ORAL_CAPSULE | Freq: Two times a day (BID) | ORAL | Status: DC
  Administered 2018-03-16: 100 mg via ORAL
  Filled 2018-03-16 (×4): qty 1

## 2018-03-16 MED ORDER — MIDAZOLAM HCL 2 MG/2ML IJ SOLN
INTRAMUSCULAR | Status: AC
  Filled 2018-03-16: qty 2

## 2018-03-16 MED ORDER — ACETAMINOPHEN 325 MG PO TABS: 325.0000 mg | ORAL_TABLET | Freq: Once | ORAL | Status: DC

## 2018-03-16 MED ORDER — ONDANSETRON HCL 4 MG/2ML IJ SOLN
INTRAMUSCULAR | Status: AC
  Filled 2018-03-16: qty 2

## 2018-03-16 MED ORDER — MEPERIDINE HCL 50 MG/ML IJ SOLN: 6.2500 mg | INTRAMUSCULAR | Status: DC | PRN

## 2018-03-16 MED ORDER — METHOCARBAMOL 500 MG PO TABS: 500.0000 mg | ORAL_TABLET | Freq: Four times a day (QID) | ORAL | Status: DC | PRN

## 2018-03-16 MED ORDER — CEFAZOLIN SODIUM-DEXTROSE 2-3 GM-%(50ML) IV SOLR
INTRAVENOUS | Status: DC | PRN
  Administered 2018-03-16: 2 g via INTRAVENOUS

## 2018-03-16 MED ORDER — BUPIVACAINE HCL (PF) 0.25 % IJ SOLN
INTRAMUSCULAR | Status: AC
  Filled 2018-03-16: qty 30

## 2018-03-16 MED ORDER — HYDROMORPHONE HCL 1 MG/ML IJ SOLN
0.2500 mg | INTRAMUSCULAR | Status: DC | PRN
  Administered 2018-03-16 (×2): 0.5 mg via INTRAVENOUS

## 2018-03-16 MED ORDER — ACETAMINOPHEN 500 MG PO TABS
500.0000 mg | ORAL_TABLET | Freq: Four times a day (QID) | ORAL | Status: AC
  Administered 2018-03-16 – 2018-03-17 (×4): 500 mg via ORAL
  Filled 2018-03-16 (×4): qty 1

## 2018-03-16 MED ORDER — LACTATED RINGERS IV SOLN
INTRAVENOUS | Status: DC | PRN
  Administered 2018-03-16 (×2): via INTRAVENOUS

## 2018-03-16 MED ORDER — 0.9 % SODIUM CHLORIDE (POUR BTL) OPTIME
TOPICAL | Status: DC | PRN
  Administered 2018-03-16: 1000 mL

## 2018-03-16 MED ORDER — ACETAMINOPHEN 160 MG/5ML PO SOLN: 325.0000 mg | Freq: Once | ORAL | Status: DC

## 2018-03-16 MED ORDER — METHOCARBAMOL 1000 MG/10ML IJ SOLN
500.0000 mg | Freq: Four times a day (QID) | INTRAVENOUS | Status: DC | PRN
  Filled 2018-03-16: qty 5

## 2018-03-16 MED ORDER — EPHEDRINE SULFATE 50 MG/ML IJ SOLN
INTRAMUSCULAR | Status: DC | PRN
  Administered 2018-03-16: 10 mg via INTRAVENOUS
  Administered 2018-03-16: 5 mg via INTRAVENOUS
  Administered 2018-03-16: 15 mg via INTRAVENOUS

## 2018-03-16 MED ORDER — LACTATED RINGERS IV SOLN: INTRAVENOUS | Status: DC

## 2018-03-16 MED ORDER — MORPHINE SULFATE (PF) 2 MG/ML IV SOLN: 0.5000 mg | INTRAVENOUS | Status: DC | PRN

## 2018-03-16 MED ORDER — FENTANYL CITRATE (PF) 250 MCG/5ML IJ SOLN
INTRAMUSCULAR | Status: AC
  Filled 2018-03-16: qty 5

## 2018-03-16 MED ORDER — CHLORHEXIDINE GLUCONATE 4 % EX LIQD: 60.0000 mL | Freq: Once | CUTANEOUS | Status: DC

## 2018-03-16 MED ORDER — HYDROCODONE-ACETAMINOPHEN 5-325 MG PO TABS: 1.0000 | ORAL_TABLET | Freq: Four times a day (QID) | ORAL | 0 refills | Status: AC | PRN

## 2018-03-16 MED ORDER — PROMETHAZINE HCL 25 MG/ML IJ SOLN: 6.2500 mg | INTRAMUSCULAR | Status: DC | PRN

## 2018-03-16 MED ORDER — SUCCINYLCHOLINE CHLORIDE 20 MG/ML IJ SOLN
INTRAMUSCULAR | Status: DC | PRN
  Administered 2018-03-16: 120 mg via INTRAVENOUS

## 2018-03-16 MED ORDER — PROPOFOL 10 MG/ML IV BOLUS
INTRAVENOUS | Status: AC
  Filled 2018-03-16: qty 20

## 2018-03-16 MED ORDER — DEXAMETHASONE SODIUM PHOSPHATE 10 MG/ML IJ SOLN
INTRAMUSCULAR | Status: DC | PRN
  Administered 2018-03-16: 4 mg via INTRAVENOUS

## 2018-03-16 MED ORDER — HYDROMORPHONE HCL 1 MG/ML IJ SOLN
INTRAMUSCULAR | Status: AC
  Administered 2018-03-16: 0.5 mg via INTRAVENOUS
  Filled 2018-03-16: qty 1

## 2018-03-16 MED ORDER — PROCHLORPERAZINE MALEATE 10 MG PO TABS: 10.0000 mg | ORAL_TABLET | Freq: Four times a day (QID) | ORAL | Status: DC | PRN

## 2018-03-16 MED ORDER — ACETAMINOPHEN 10 MG/ML IV SOLN: 1000.0000 mg | Freq: Once | INTRAVENOUS | Status: DC | PRN

## 2018-03-16 MED ORDER — ENOXAPARIN SODIUM 40 MG/0.4ML ~~LOC~~ SOLN
40.0000 mg | SUBCUTANEOUS | Status: DC
  Administered 2018-03-17 – 2018-03-18 (×2): 40 mg via SUBCUTANEOUS
  Filled 2018-03-16 (×2): qty 0.4

## 2018-03-16 MED ORDER — ONDANSETRON HCL 4 MG PO TABS: 4.0000 mg | ORAL_TABLET | Freq: Four times a day (QID) | ORAL | Status: DC | PRN

## 2018-03-16 MED ORDER — POVIDONE-IODINE 10 % EX SWAB: 2.0000 "application " | Freq: Once | CUTANEOUS | Status: DC

## 2018-03-16 MED ORDER — ONDANSETRON HCL 4 MG/2ML IJ SOLN
INTRAMUSCULAR | Status: DC | PRN
  Administered 2018-03-16: 4 mg via INTRAVENOUS

## 2018-03-16 MED ORDER — ACETAMINOPHEN 500 MG PO TABS
1000.0000 mg | ORAL_TABLET | Freq: Once | ORAL | Status: AC
  Administered 2018-03-16: 1000 mg via ORAL
  Filled 2018-03-16: qty 2

## 2018-03-16 MED ORDER — CEFAZOLIN SODIUM-DEXTROSE 2-4 GM/100ML-% IV SOLN
2.0000 g | Freq: Four times a day (QID) | INTRAVENOUS | Status: AC
  Administered 2018-03-16 (×2): 2 g via INTRAVENOUS
  Filled 2018-03-16 (×2): qty 100

## 2018-03-16 MED ORDER — HYDROCODONE-ACETAMINOPHEN 5-325 MG PO TABS
1.0000 | ORAL_TABLET | ORAL | Status: DC | PRN
  Administered 2018-03-16 – 2018-03-17 (×4): 1 via ORAL
  Administered 2018-03-18 (×2): 2 via ORAL
  Filled 2018-03-16: qty 2
  Filled 2018-03-16: qty 1
  Filled 2018-03-16: qty 2
  Filled 2018-03-16: qty 1
  Filled 2018-03-16: qty 2
  Filled 2018-03-16 (×2): qty 1

## 2018-03-16 MED ORDER — LIDOCAINE 2% (20 MG/ML) 5 ML SYRINGE
INTRAMUSCULAR | Status: DC | PRN
  Administered 2018-03-16: 40 mg via INTRAVENOUS

## 2018-03-16 MED ORDER — BISACODYL 10 MG RE SUPP: 10.0000 mg | Freq: Every day | RECTAL | Status: DC | PRN

## 2018-03-16 MED ORDER — GABAPENTIN 300 MG PO CAPS
300.0000 mg | ORAL_CAPSULE | Freq: Once | ORAL | Status: AC
  Administered 2018-03-16: 300 mg via ORAL
  Filled 2018-03-16: qty 1

## 2018-03-16 MED ORDER — CEFAZOLIN SODIUM-DEXTROSE 2-4 GM/100ML-% IV SOLN
2.0000 g | INTRAVENOUS | Status: DC
  Filled 2018-03-16: qty 100

## 2018-03-16 MED ORDER — ONDANSETRON HCL 4 MG/2ML IJ SOLN: 4.0000 mg | Freq: Four times a day (QID) | INTRAMUSCULAR | Status: DC | PRN

## 2018-03-16 MED ORDER — FENTANYL CITRATE (PF) 100 MCG/2ML IJ SOLN
INTRAMUSCULAR | Status: DC | PRN
  Administered 2018-03-16: 100 ug via INTRAVENOUS
  Administered 2018-03-16: 50 ug via INTRAVENOUS

## 2018-03-16 MED ORDER — METOCLOPRAMIDE HCL 5 MG PO TABS: 5.0000 mg | ORAL_TABLET | Freq: Three times a day (TID) | ORAL | Status: DC | PRN

## 2018-03-16 MED ORDER — SUGAMMADEX SODIUM 200 MG/2ML IV SOLN
INTRAVENOUS | Status: DC | PRN
  Administered 2018-03-16: 200 mg via INTRAVENOUS

## 2018-03-16 MED ORDER — ENOXAPARIN SODIUM 40 MG/0.4ML ~~LOC~~ SOLN: 40.0000 mg | SUBCUTANEOUS | 0 refills | Status: AC

## 2018-03-16 MED ORDER — PHENYLEPHRINE 40 MCG/ML (10ML) SYRINGE FOR IV PUSH (FOR BLOOD PRESSURE SUPPORT)
PREFILLED_SYRINGE | INTRAVENOUS | Status: AC
  Filled 2018-03-16: qty 10

## 2018-03-16 MED ORDER — TRAMADOL HCL 50 MG PO TABS: 50.0000 mg | ORAL_TABLET | Freq: Four times a day (QID) | ORAL | Status: DC | PRN

## 2018-03-16 MED ORDER — ROCURONIUM BROMIDE 50 MG/5ML IV SOSY
PREFILLED_SYRINGE | INTRAVENOUS | Status: DC | PRN
  Administered 2018-03-16: 30 mg via INTRAVENOUS

## 2018-03-16 MED ORDER — BUPIVACAINE HCL (PF) 0.25 % IJ SOLN
INTRAMUSCULAR | Status: DC | PRN
  Administered 2018-03-16: 10 mL

## 2018-03-16 MED ORDER — PROPOFOL 10 MG/ML IV BOLUS
INTRAVENOUS | Status: DC | PRN
  Administered 2018-03-16: 120 mg via INTRAVENOUS

## 2018-03-16 MED ORDER — CLOPIDOGREL BISULFATE 75 MG PO TABS
75.0000 mg | ORAL_TABLET | Freq: Every day | ORAL | Status: DC
  Administered 2018-03-17 – 2018-03-18 (×2): 75 mg via ORAL
  Filled 2018-03-16 (×2): qty 1

## 2018-03-16 MED ORDER — GABAPENTIN 100 MG PO CAPS
200.0000 mg | ORAL_CAPSULE | Freq: Two times a day (BID) | ORAL | Status: AC
  Administered 2018-03-16 – 2018-03-17 (×4): 200 mg via ORAL
  Filled 2018-03-16 (×4): qty 2

## 2018-03-16 MED ORDER — METOCLOPRAMIDE HCL 5 MG/ML IJ SOLN: 5.0000 mg | Freq: Three times a day (TID) | INTRAMUSCULAR | Status: DC | PRN

## 2018-03-16 SURGICAL SUPPLY — 38 items
BIT DRILL 4.9 CANNULATED (BIT) ×1
BIT DRILL CANN QC 4.9 LRG (BIT) IMPLANT
BNDG COHESIVE 4X5 TAN STRL (GAUZE/BANDAGES/DRESSINGS) ×7 IMPLANT
BNDG GAUZE ELAST 4 BULKY (GAUZE/BANDAGES/DRESSINGS) ×5 IMPLANT
COVER PERINEAL POST (MISCELLANEOUS) ×3 IMPLANT
COVER SURGICAL LIGHT HANDLE (MISCELLANEOUS) ×3 IMPLANT
COVER WAND RF STERILE (DRAPES) ×3 IMPLANT
DRAPE STERI IOBAN 125X83 (DRAPES) ×3 IMPLANT
DRILL BIT CANNULATED 4.9 (BIT) ×2
DRSG MEPILEX BORDER 4X4 (GAUZE/BANDAGES/DRESSINGS) ×3 IMPLANT
DURAPREP 26ML APPLICATOR (WOUND CARE) ×3 IMPLANT
ELECT REM PT RETURN 9FT ADLT (ELECTROSURGICAL) ×3
ELECTRODE REM PT RTRN 9FT ADLT (ELECTROSURGICAL) ×1 IMPLANT
GLOVE BIO SURGEON STRL SZ7.5 (GLOVE) ×6 IMPLANT
GLOVE BIOGEL PI IND STRL 8 (GLOVE) ×2 IMPLANT
GLOVE BIOGEL PI INDICATOR 8 (GLOVE) ×4
GOWN STRL REUS W/ TWL LRG LVL3 (GOWN DISPOSABLE) ×3 IMPLANT
GOWN STRL REUS W/TWL LRG LVL3 (GOWN DISPOSABLE) ×6
GUIDEWIRE THRD ASNIS 3.2X300 (WIRE) ×6 IMPLANT
KIT BASIN OR (CUSTOM PROCEDURE TRAY) ×3 IMPLANT
KIT TURNOVER KIT B (KITS) ×3 IMPLANT
MANIFOLD NEPTUNE II (INSTRUMENTS) ×3 IMPLANT
NS IRRIG 1000ML POUR BTL (IV SOLUTION) ×3 IMPLANT
PACK GENERAL/GYN (CUSTOM PROCEDURE TRAY) ×3 IMPLANT
PAD ARMBOARD 7.5X6 YLW CONV (MISCELLANEOUS) ×6 IMPLANT
SCREW ASNIS 100MM (Screw) ×2 IMPLANT
SCREW ASNIS 90MM (Screw) ×2 IMPLANT
SCREW ASNIS 95MM (Screw) ×2 IMPLANT
SUT MNCRL AB 4-0 PS2 18 (SUTURE) ×2 IMPLANT
SUT MON AB 2-0 CT1 36 (SUTURE) ×5 IMPLANT
SUT VIC AB 0 CT1 27 (SUTURE) ×4
SUT VIC AB 0 CT1 27XBRD ANBCTR (SUTURE) IMPLANT
SUT VIC AB 2-0 CT1 27 (SUTURE) ×2
SUT VIC AB 2-0 CT1 TAPERPNT 27 (SUTURE) IMPLANT
TAPE STRIPS DRAPE STRL (GAUZE/BANDAGES/DRESSINGS) ×2 IMPLANT
TOWEL OR 17X24 6PK STRL BLUE (TOWEL DISPOSABLE) ×3 IMPLANT
TOWEL OR 17X26 10 PK STRL BLUE (TOWEL DISPOSABLE) ×3 IMPLANT
WATER STERILE IRR 1000ML POUR (IV SOLUTION) ×3 IMPLANT

## 2018-03-16 NOTE — Transfer of Care (Signed)
Immediate Anesthesia Transfer of Care Note  Patient: Steven Drake  Procedure(s) Performed: LEFT HIP PINNING WITH STRYKER (Left Hip)  Patient Location: PACU  Anesthesia Type:General  Level of Consciousness: awake, alert  and oriented  Airway & Oxygen Therapy: Patient Spontanous Breathing and Patient connected to nasal cannula oxygen  Post-op Assessment: Report given to RN and Post -op Vital signs reviewed and stable  Post vital signs: Reviewed and stable  Last Vitals:  Vitals Value Taken Time  BP 121/52 03/16/2018  8:49 AM  Temp 36.5 C 03/16/2018  8:49 AM  Pulse 67 03/16/2018  8:53 AM  Resp 23 03/16/2018  8:53 AM  SpO2 97 % 03/16/2018  8:53 AM  Vitals shown include unvalidated device data.  Last Pain:  Vitals:   03/16/18 0849  TempSrc:   PainSc: 0-No pain         Complications: No apparent anesthesia complications

## 2018-03-16 NOTE — Anesthesia Procedure Notes (Signed)
Procedure Name: Intubation Date/Time: 03/16/2018 7:43 AM Performed by: Inda Coke, CRNA Pre-anesthesia Checklist: Patient identified, Emergency Drugs available, Suction available and Patient being monitored Patient Re-evaluated:Patient Re-evaluated prior to induction Oxygen Delivery Method: Circle System Utilized Preoxygenation: Pre-oxygenation with 100% oxygen Induction Type: IV induction Ventilation: Mask ventilation without difficulty Laryngoscope Size: 4 Grade View: Grade I Tube type: Oral Tube size: 7.5 mm Number of attempts: 1 Airway Equipment and Method: Stylet and Oral airway Placement Confirmation: ETT inserted through vocal cords under direct vision,  positive ETCO2 and breath sounds checked- equal and bilateral Secured at: 22 cm Tube secured with: Tape Dental Injury: Teeth and Oropharynx as per pre-operative assessment

## 2018-03-16 NOTE — Plan of Care (Signed)
  Problem: Education: Goal: Knowledge of General Education information will improve Description: Including pain rating scale, medication(s)/side effects and non-pharmacologic comfort measures Outcome: Progressing   Problem: Health Behavior/Discharge Planning: Goal: Ability to manage health-related needs will improve Outcome: Progressing   Problem: Clinical Measurements: Goal: Ability to maintain clinical measurements within normal limits will improve Outcome: Progressing Goal: Will remain free from infection Outcome: Progressing Goal: Diagnostic test results will improve Outcome: Progressing Goal: Respiratory complications will improve Outcome: Progressing Goal: Cardiovascular complication will be avoided Outcome: Progressing   Problem: Activity: Goal: Risk for activity intolerance will decrease Outcome: Progressing   Problem: Nutrition: Goal: Adequate nutrition will be maintained Outcome: Progressing   Problem: Coping: Goal: Level of anxiety will decrease Outcome: Progressing   Problem: Elimination: Goal: Will not experience complications related to bowel motility Outcome: Progressing Goal: Will not experience complications related to urinary retention Outcome: Progressing   Problem: Pain Managment: Goal: General experience of comfort will improve Outcome: Progressing   Problem: Safety: Goal: Ability to remain free from injury will improve Outcome: Progressing   Problem: Skin Integrity: Goal: Risk for impaired skin integrity will decrease Outcome: Progressing   Problem: Education: Goal: Verbalization of understanding the information provided (i.e., activity precautions, restrictions, etc) will improve Outcome: Progressing   Problem: Activity: Goal: Ability to ambulate and perform ADLs will improve Outcome: Progressing   Problem: Clinical Measurements: Goal: Postoperative complications will be avoided or minimized Outcome: Progressing   Problem:  Self-Concept: Goal: Ability to maintain and perform role responsibilities to the fullest extent possible will improve Outcome: Progressing   Problem: Pain Management: Goal: Pain level will decrease Outcome: Progressing   

## 2018-03-16 NOTE — H&P (View-Only) (Signed)
ORTHOPAEDIC CONSULTATION  REQUESTING PHYSICIAN: Charlynne Cousins, MD  Chief Complaint: Left hip pain  [name] is a 73 y.o. male who complains of left hip pain that started about 8 weeks ago but worsened yesterday.  He did have images previously including CT which did not show fracture.  But new images do show a left femoral neck fracture.  Orthopedics was consulted for evaluation.  This morning his pain is under control.  He has metastatic cancer and is not undergoing treatment due to advanced nature of same.  He is a current smoker.  Previously ambulatory.  Denies history of DVT/PE.   Past Medical History:  Diagnosis Date  . Adenocarcinoma of cecum ([place]) 07/01/2015  . 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 CAD (coronary artery disease) RCA non dominant vessel, LAD 40%, 80-90% 2nd diag EF 55%  01/19/2013   Dr. Claiborne Billings [place] 03-05-15 Epic.  . Family history of colon cancer   . Family history of stomach cancer   . Gastric adenocarcinoma ([place]) 06/29/2015  . Gastric ulcer    many yrs ago  . GERD (gastroesophageal reflux disease)   . Hyperlipidemia LDL goal < 70 01/19/2013  . Hypertension   . Hypokalemia 01/20/2013  . Metabolic syndrome, with mildly elevated HgBA1C 01/20/2013  . Pulmonary nodules 01/19/2013  . STEMI (ST elevation myocardial infarction), 01/19/13 01/19/2013  . Tobacco abuse 01/20/2013  . Transfusion history    with gastric ulcer- many yrs ago   Past Surgical History:  Procedure Laterality Date  . APPENDECTOMY    . BIOPSY  10/05/2016   Procedure: BIOPSY;  Surgeon: Daneil Dolin, MD;  Location: [facility];  Service: Endoscopy;;  sigmoid anastomosis  . CATARACT EXTRACTION, BILATERAL    . COLONOSCOPY N/A 06/10/2015   RMR: cecal neoplasm  ? biopsied. large polyp at the hepatic flexure removed with piecmeal polypectomy and APC ablation. lesion tattooed. Large polyp in the mid sigmoid status post piecemeal hot snare debulking and  tattooing. this lesion not completely removed. scatterd pancolonic diverticulsosis.   Marland Kitchen ESOPHAGOGASTRODUODENOSCOPY N/A 06/10/2015   Procedure: ESOPHAGOGASTRODUODENOSCOPY (EGD);  Surgeon: Daneil Dolin, MD;  Location: [facility];  Service: Endoscopy;  Laterality: N/A;  . ESOPHAGOGASTRODUODENOSCOPY N/A 06/17/2015   RMR: normal esophagus small hiatal hernia. Abnormal nodular antrum .pyloric channel status post biopsy.   Marland Kitchen FLEXIBLE SIGMOIDOSCOPY N/A 10/05/2016   Dr. Gala Romney: stenotic sigmoid ileal anastomosis with abnormal appearing mucosa with appearance like scar tissue, s/p biopsy. biopsies with colonic mucosa and patchy ulceration, acute and chronic inflammation.   Marland Kitchen HERNIA REPAIR Right   . LAPAROSCOPIC SUBTOTAL COLECTOMY N/A 08/02/2015   Procedure: LAPAROSCOPIC SUBTOTAL COLECTOMY  AND DISTAL GASTRECTOMY ;  Surgeon: Stark Klein, MD;  Location: [facility];  Service: General;  Laterality: N/A;  . LEFT HEART CATH N/A 01/19/2013   Procedure: LEFT HEART CATH;  Surgeon: Troy Sine, MD;  Location: [facility] CATH LAB;  Service: Cardiovascular;  Laterality: N/A;   Social History   Socioeconomic History  . Marital status: Widowed    Spouse name: Not on file  . Number of children: 0  . Years of education: Not on file  . Highest education level: Not on file  Occupational History  . Occupation: farmer  Social Needs  . Financial resource strain: Not on file  . Food insecurity:    Worry: Not on file    Inability: Not on file  . Transportation needs:    Medical: Not on file  Non-medical: Not on file  Tobacco Use  . Smoking status: Current Every Day Smoker    Packs/day: 1.00    Years: 58.00    Pack years: 58.00    Types: Cigarettes    Start date: 01/21/1963  . Smokeless tobacco: Never Used  . Tobacco comment: one pack daily  Substance and Sexual Activity  . Alcohol use: No    Alcohol/week: 0.0 standard drinks    Comment: none in 15 yrs -heavy user-none now.  . Drug use: No  . Sexual activity: Not  on file  Lifestyle  . Physical activity:    Days per week: Not on file    Minutes per session: Not on file  . Stress: Not on file  Relationships  . Social connections:    Talks on phone: Not on file    Gets together: Not on file    Attends religious service: Not on file    Active member of club or organization: Not on file    Attends meetings of clubs or organizations: Not on file    Relationship status: Not on file  Other Topics Concern  . Not on file  Social History Narrative  . Not on file   Family History  Problem Relation Age of Onset  . Heart attack Mother   . Colon cancer Father        dx in his 49s  . Colon cancer Sister        dx possibly in her early 40s  . Stomach cancer Sister        dx in her 79s  . Colon cancer Paternal Uncle    Allergies  Allergen Reactions  . Contrast Media [Iodinated Diagnostic Agents] Itching and Rash   Prior to Admission medications   Medication Sig Start Date End Date Taking? Authorizing Provider  acetaminophen (TYLENOL) 325 MG tablet Take 650 mg by mouth daily as needed for moderate pain or headache.   Yes [provider]  aspirin 81 MG EC tablet Take 81 mg by mouth daily. 11/24/15  Yes [provider]  atorvastatin (LIPITOR) 40 MG tablet Take 1 tablet (40 mg total) by mouth daily. 10/31/17  Yes Dettinger, Fransisca Kaufmann, MD  clopidogrel (PLAVIX) 75 MG tablet TAKE 1 TABLET BY MOUTH DAILY WITH BREAKFEST Patient taking differently: Take 75 mg by mouth daily.  01/21/18  Yes Wellington Hampshire, MD  diphenoxylate-atropine (LOMOTIL) 2.5-0.025 MG tablet TAKE 1 TO 2 TABLETS 4 TIMES DAILY AS NEEDED FOR DIARRHEA 10/08/17  Yes Annitta Needs, NP  ezetimibe (ZETIA) 10 MG tablet Take 10 mg by mouth daily. 12/30/17  Yes [provider]  lidocaine-prilocaine (EMLA) cream APPLY A QUARTER SIZE AMOUNT TO PORT SITE 1 HOUR PRIOR TO CHEMO. DO NOT RUB IN. COVER WITH PLASTIC WRAP. 09/04/15  Yes [provider]  lisinopril  (PRINIVIL,ZESTRIL) 5 MG tablet Take 0.5 tablets (2.5 mg total) by mouth daily. 02/07/18  Yes Troy Sine, MD  ondansetron (ZOFRAN) 8 MG tablet Take 1 tablet (8 mg total) by mouth every 8 (eight) hours as needed for nausea or vomiting. 09/04/15  Yes Penland, Kelby Fam, MD  pantoprazole (PROTONIX) 40 MG tablet TAKE 1 TABLET BY MOUTH DAILY AT 6:00 A.M. Patient taking differently: Take 40 mg by mouth daily.  10/31/17  Yes Dettinger, Fransisca Kaufmann, MD  prochlorperazine (COMPAZINE) 10 MG tablet Take 1 tablet (10 mg total) by mouth every 6 (six) hours as needed for nausea or vomiting. Patient not taking: Reported on 03/15/2018  09/04/15   Penland, Kelby Fam, MD  traMADol (ULTRAM) 50 MG tablet Take 1 tablet (50 mg total) by mouth every 6 (six) hours as needed. Patient not taking: Reported on 03/15/2018 01/16/18   Carmin Muskrat, MD   Dg Chest 2 View  Result Date: 03/15/2018 CLINICAL DATA:  Left hip pain first weeks, difficulty walking, injury getting above a couch at home, felt a pop in LEFT hip; history of adenocarcinoma of the rectosigmoid junction, gastric adenocarcinoma, smoker, coronary artery disease post MI, GERD EXAM: CHEST - 2 VIEW COMPARISON:  PET-CT 03/26/2017 FINDINGS: RIGHT jugular Port-A-Cath with tip projecting over cavoatrial junction. Normal heart size, mediastinal contours, and pulmonary vascularity. Atherosclerotic calcifications aorta. Multiple pulmonary nodules are identified, largest lateral mid RIGHT lung 3.5 x 3.0 cm increased from previous exam. Additional LEFT perihilar/hilar nodule 3.0 x 2.7 cm, increased from previous exam. Findings are consistent with progressive pulmonary metastatic disease. Increased number of pulmonary nodule since prior study. No acute infiltrate, pleural effusion or pneumothorax. Degenerative changes of the shoulder joints bilaterally. IMPRESSION: Progressive pulmonary metastatic disease since 03/26/2017. Electronically Signed   By: Lavonia Dana M.D.   On: 03/15/2018  17:42   Dg Hip Unilat W Or Wo Pelvis 2-3 Views Left  Result Date: 03/15/2018 CLINICAL DATA:  Left hip pain. EXAM: DG HIP (WITH OR WITHOUT PELVIS) 2-3V LEFT COMPARISON:  January 16, 2018 FINDINGS: There is displaced fracture of the left femoral neck. No other acute fracture dislocation is identified. IMPRESSION: Displaced fracture of left femoral neck. Electronically Signed   By: Abelardo Diesel M.D.   On: 03/15/2018 20:37    Positive ROS: All other systems have been reviewed and were otherwise negative with the exception of those mentioned in the HPI and as above.  Objective: Labs cbc Recent Labs    03/15/18 1645 03/16/18 0405  WBC 15.5* 11.5*  HGB 12.0* 10.7*  HCT 38.3* 34.4*  PLT 303 265    Labs inflam No results for input(s): CRP in the last 72 hours.  Invalid input(s): ESR  Labs coag No results for input(s): INR, PTT in the last 72 hours.  Invalid input(s): PT  Recent Labs    03/15/18 1645 03/16/18 0405  NA 141 140  K 3.9 4.3  CL 106 105  CO2 25 24  GLUCOSE 102* 96  BUN 24* 22  CREATININE 1.15 1.06  CALCIUM 8.8* 8.4*    Physical Exam: Vitals:   03/15/18 2054 03/16/18 0447  BP: (!) 166/70 116/64  Pulse: 63 65  Resp: 18 18  Temp: 98.3 F (36.8 C) 98.4 F (36.9 C)  SpO2: 100% 99%   General: Alert, no acute distress.  Supine in bed.  Calm, conversant.  Wife at bedside. Mental status: Alert and Oriented x3 Neurologic: Speech Clear and organized, no gross focal findings or movement disorder appreciated. Respiratory: No cyanosis, no use of accessory musculature Cardiovascular: No pedal edema GI: Abdomen is soft and non-tender, non-distended. Skin: Warm and dry.  No lesions in the area of chief complaint . Extremities: Warm and well perfused w/o edema Psychiatric: Patient is competent for consent with normal mood and affect  MUSCULOSKELETAL:  LLE: Pain with range of motion left hip.  EHL, FHL, dorsiflexion, plantarflexion intact.  Sensation intact  distally.  Feet warm. Other extremities are atraumatic with painless ROM and NVI.  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 Edmonia Lynch.  We discussed pinning versus hemiarthroplasty including reasons for each and risks/benefits of each.  He agrees with plan for hip pinning.  -NPO  -Medicine team to admit and perform pre-op clearance -PT/OT post op -Foley okay prn for comfort  -Likely to require Rehab or SNF placement upon discharge.   The risks benefits and alternatives of the procedure were discussed with the patient including but not limited to infection, bleeding, nerve injury, the need for revision surgery, blood clots, cardiopulmonary complications, morbidity, mortality, among others.  The patient verbalizes understanding and wishes to proceed.     Weightbearing: Bedrest.  Will amend post op. VTE prophylaxis:. SCDs.  Likely Lovenox postoperatively. Pain control: Minimize narcotics if able. Follow-up plan: Will follow in acute inpatient post-op phase.  Plan for outpatient follow up with Dr. Alain Marion in about 2 weeks. Contact information:  Edmonia Lynch MD, Roxan Hockey PA-C  Prudencio Burly III PA-C 03/16/2018 5:36 AM

## 2018-03-16 NOTE — Op Note (Signed)
03/16/2018  9:42 AM  PATIENT:  Steven Drake    PRE-OPERATIVE DIAGNOSIS:  LEFT HIP RACTURE  POST-OPERATIVE DIAGNOSIS:  Same  PROCEDURE:  LEFT HIP PINNING WITH STRYKER  SURGEON:  Renette Butters, MD  ASSISTANT: Roxan Hockey, PA-C, he was present and scrubbed throughout the case, critical for completion in a timely fashion, and for retraction, instrumentation, and closure.   ANESTHESIA:   General  PREOPERATIVE INDICATIONS:  Steven Drake is a  73 y.o. male who fell and was found to have a diagnosis of LEFT HIP [place] who elected for surgical management.    The risks benefits and alternatives were discussed with the patient preoperatively including but not limited to the risks of infection, bleeding, nerve injury, cardiopulmonary complications, blood clots, malunion, nonunion, avascular necrosis, the need for revision surgery, the potential for conversion to hemiarthroplasty, among others, and the patient was willing to proceed.  OPERATIVE IMPLANTS: 6.5 mm cannulated screws x3  OPERATIVE FINDINGS: Clinical osteoporosis with weak bone, proximal femur  OPERATIVE PROCEDURE: The patient was brought to the operating room and placed in supine position. IV antibiotics were given. General anesthesia administered. Foley was also given. The patient was placed on the fracture table. The operative extremity was positioned, without any significant reduction maneuver and was prepped and draped in usual sterile fashion.  Time out was performed.  Small incisions were made distal to the greater trochanter, and 3 guidewires were introduced Into an inverted triangle configuration. The lengths were measured. The reduction was slightly valgus, and near-anatomic. I opened the cortex with a cannulated drill, and then placed the screws into position. Satisfactory fixation was achieved. I sequentially tightened the screws by hand.  I performed a live fluoroscopic exam and no screw penetrance was noted. All  threads crossed the fracture site.   The wounds were irrigated copiously, and repaired with Vicryl with Steri-Strips and sterile gauze. There no complications and the patient tolerated the procedure well.  The patient will be weightbearing as tolerated, VTE prophylaxis will be: chemical and mobilization

## 2018-03-16 NOTE — Anesthesia Preprocedure Evaluation (Addendum)
Anesthesia Evaluation  Patient identified by MRN, date of birth, ID band Patient awake    Reviewed: Allergy & Precautions, NPO status , Patient's Chart, lab work & pertinent test results  Airway Mallampati: III  TM Distance: >3 FB Neck ROM: Full    Dental  (+) Edentulous Upper, Edentulous Lower   Pulmonary Current Smoker,     + decreased breath sounds(-) wheezing      Cardiovascular hypertension, Pt. on medications + CAD and + Past MI   Rhythm:Regular Rate:Normal     Neuro/Psych negative neurological ROS  negative psych ROS   GI/Hepatic Neg liver ROS, hiatal hernia, PUD, GERD  Medicated,  Endo/Other  negative endocrine ROS  Renal/GU negative Renal ROS     Musculoskeletal negative musculoskeletal ROS (+)   Abdominal Normal abdominal exam  (+)   Peds  Hematology   Anesthesia Other Findings   Reproductive/Obstetrics negative OB ROS                            Lab Results  Component Value Date   WBC 11.5 (H) 03/16/2018   HGB 10.7 (L) 03/16/2018   HCT 34.4 (L) 03/16/2018   MCV 91.7 03/16/2018   PLT 265 03/16/2018   Lab Results  Component Value Date   CREATININE 1.06 03/16/2018   BUN 22 03/16/2018   NA 140 03/16/2018   K 4.3 03/16/2018   CL 105 03/16/2018   CO2 24 03/16/2018   Lab Results  Component Value Date   INR 1.05 10/24/2017   INR 1.02 09/15/2015   Echo: Left ventricle: The cavity size was normal. Systolic function was normal. The estimated ejection fraction was in the range of 55% to 60%. Mild hypokinesis of the distalanterolateral myocardium. Left ventricular diastolic function parameters were normal.       Transthoracic echocardiography. M-mode, complete 2D, spectral Doppler, and color Doppler. Height: Height: 182.9cm. Height: 72in. Weight: Weight: 75kg. Weight: 165lb. Body mass index: BMI: 22.4kg/m^2. Body surface area:  BSA: 1.52m^2.  Blood pressure:   138/64. Patient status: Inpatient. Location: ICU/CCU  Anesthesia Physical Anesthesia Plan  ASA: III  Anesthesia Plan: General   Post-op Pain Management:    Induction: Intravenous  PONV Risk Score and Plan: 2 and Ondansetron and Dexamethasone  Airway Management Planned: Oral ETT  Additional Equipment: None  Intra-op Plan:   Post-operative Plan: Extubation in OR  Informed Consent: I have reviewed the patients History and Physical, chart, labs and discussed the procedure including the risks, benefits and alternatives for the proposed anesthesia with the patient or authorized representative who has indicated his/her understanding and acceptance.     Dental advisory given  Plan Discussed with: CRNA  Anesthesia Plan Comments:        Anesthesia Quick Evaluation

## 2018-03-16 NOTE — Anesthesia Postprocedure Evaluation (Signed)
Anesthesia Post Note  Patient: Steven Drake  Procedure(s) Performed: LEFT HIP PINNING WITH STRYKER (Left Hip)     Patient location during evaluation: PACU Anesthesia Type: General Level of consciousness: awake and alert Pain management: pain level controlled Vital Signs Assessment: post-procedure vital signs reviewed and stable Respiratory status: spontaneous breathing, nonlabored ventilation, respiratory function stable and patient connected to nasal cannula oxygen Cardiovascular status: blood pressure returned to baseline and stable Postop Assessment: no apparent nausea or vomiting Anesthetic complications: no    Last Vitals:  Vitals:   03/16/18 0920 03/16/18 0941  BP:  111/68  Pulse:  (!) 57  Resp:  13  Temp: 36.5 C 36.6 C  SpO2:  100%    Last Pain:  Vitals:   03/16/18 0941  TempSrc: Oral  PainSc:                  [name]

## 2018-03-16 NOTE — Progress Notes (Addendum)
[facility] PROGRESS NOTE    Progress Note  Steven Drake  VQQ:[id number] DOB: 04/01/1945 DOA: 03/15/2018 PCP: Dettinger, Fransisca Kaufmann, MD     Brief Narrative:   Steven Drake is an 73 y.o. male past medical history of stomach cancer, metastatic colon cancer stage IV hyperlipidemia coronary artery disease pulmonary nodule suspected to be carcinoma presents to the ED after left hip pain that has been present for several days prior to admission after visiting several doctors a CT scan of the hip was done the day prior to admission that showed a left femoral neck fracture and he was admitted for surgical intervention  Assessment/Plan:   Closed left femoral fracture (HCC) X-ray of the hip showed displaced left femur fracture. Orthopedic surgery was consulted recommended surgical intervention which was done on 03/16/2018, status post left hip pinning with a [employer] Tolerating his diet. Awaiting physical therapy evaluation. Continue current regimen for pain control. Mild Drop in hemoglobin recheck a CBC tomorrow morning.  Metastatic gastric and colon cancer: We will need to follow-up with oncology as an outpatient, the patient is not on any active treatment.  Tobacco abuse: Ongoing nicotine patch placed.  Essential hypertension: Resume antihypertensive medication regimen.  Hyperlipidemia with target LDL less than 70 Cont statins  DVT prophylaxis: heparin Family Communication:wife Disposition Plan/Barrier to D/C: Awaiting SNF eval Code Status:     Code Status Orders  (From admission, onward)         Start     Ordered   03/15/18 2100  Full code  Continuous     03/15/18 2100        Code Status History    Date Active Date Inactive Code Status Order ID Comments User Context   03/15/2018 2013 03/15/2018 2100 DNR [id number]  Elwyn Reach, MD ED   01/19/2013 0627 01/21/2013 1714 Full Code [id number]  Inez Pilgrim, MD Inpatient        IV Access:    Peripheral  IV   Procedures and diagnostic studies:   Dg Chest 2 View  Result Date: 03/15/2018 CLINICAL DATA:  Left hip pain first weeks, difficulty walking, injury getting above a couch at home, felt a pop in LEFT hip; history of adenocarcinoma of the rectosigmoid junction, gastric adenocarcinoma, smoker, coronary artery disease post MI, GERD EXAM: CHEST - 2 VIEW COMPARISON:  PET-CT 03/26/2017 FINDINGS: RIGHT jugular Port-A-Cath with tip projecting over cavoatrial junction. Normal heart size, mediastinal contours, and pulmonary vascularity. Atherosclerotic calcifications aorta. Multiple pulmonary nodules are identified, largest lateral mid RIGHT lung 3.5 x 3.0 cm increased from previous exam. Additional LEFT perihilar/hilar nodule 3.0 x 2.7 cm, increased from previous exam. Findings are consistent with progressive pulmonary metastatic disease. Increased number of pulmonary nodule since prior study. No acute infiltrate, pleural effusion or pneumothorax. Degenerative changes of the shoulder joints bilaterally. IMPRESSION: Progressive pulmonary metastatic disease since 03/26/2017. Electronically Signed   By: Lavonia Dana M.D.   On: 03/15/2018 17:42   Dg C-arm 1-60 Min  Result Date: 03/16/2018 CLINICAL DATA:  ORIF left hip fracture EXAM: DG C-ARM 61-120 MIN COMPARISON:  03/15/2018 left hip radiographs FINDINGS: Fluoroscopy time 0 minutes 32 seconds. Two spot fluoroscopic intraoperative left hip radiographs demonstrate transfixation of left femoral neck fracture by 3 pins, which appears in anatomic alignment on these views. IMPRESSION: Intraoperative fluoroscopic guidance for ORIF left femoral neck fracture. Electronically Signed   By: Ilona Sorrel M.D.   On: 03/16/2018 08:40   Dg Hip Operative Unilat W Or W/o Pelvis  Left  Result Date: 03/16/2018 CLINICAL DATA:  Hip fracture fixation EXAM: OPERATIVE left HIP (WITH PELVIS IF PERFORMED) 2  VIEWS TECHNIQUE: Fluoroscopic spot image(s) were submitted for interpretation  post-operatively. COMPARISON:  03/15/2018 FINDINGS: Left femoral neck fracture fixed with 3 screws. Fracture alignment satisfactory. IMPRESSION: Screw fixation of left femoral neck fracture. Electronically Signed   By: Franchot Gallo M.D.   On: 03/16/2018 08:36   Dg Hip Unilat W Or Wo Pelvis 2-3 Views Left  Result Date: 03/15/2018 CLINICAL DATA:  Left hip pain. EXAM: DG HIP (WITH OR WITHOUT PELVIS) 2-3V LEFT COMPARISON:  January 16, 2018 FINDINGS: There is displaced fracture of the left femoral neck. No other acute fracture dislocation is identified. IMPRESSION: Displaced fracture of left femoral neck. Electronically Signed   By: Abelardo Diesel M.D.   On: 03/15/2018 20:37     Medical Consultants:    None.  Anti-Infectives:   Cefazolin  Subjective:    [name] patient is sleepy has no pain.  Objective:    Vitals:   03/16/18 0904 03/16/18 0919 03/16/18 0920 03/16/18 0941  BP: (!) 100/52 (!) 93/46  111/68  Pulse: 60 (!) 58  (!) 57  Resp: 12 11  13   Temp:   97.7 F (36.5 C) 97.8 F (36.6 C)  TempSrc:    Oral  SpO2: 91% 92%  100%    Intake/Output Summary (Last 24 hours) at 03/16/2018 1020 Last data filed at 03/16/2018 0838 Gross per 24 hour  Intake 1505.83 ml  Output 25 ml  Net 1480.83 ml   There were no vitals filed for this visit.  Exam: General exam: In no acute distress. Respiratory system: Good air movement and clear to auscultation. Cardiovascular system: S1 & S2 heard, RRR. Gastrointestinal system: Abdomen is nondistended, soft and nontender.  Central nervous system: Alert and oriented. No focal neurological deficits. Extremities: No pedal edema. Skin: No rashes, lesions or ulcers Psychiatry: Judgement and insight appear normal. Mood & affect appropriate.    Data Reviewed:    Labs: Basic Metabolic Panel: Recent Labs  Lab 03/15/18 1645 03/16/18 0405  NA 141 140  K 3.9 4.3  CL 106 105  CO2 25 24  GLUCOSE 102* 96  BUN 24* 22  CREATININE 1.15  1.06  CALCIUM 8.8* 8.4*   GFR CrCl cannot be calculated (Unknown ideal weight.). Liver Function Tests: Recent Labs  Lab 03/15/18 1645 03/16/18 0405  AST 25 17  ALT 35 27  ALKPHOS 129* 120  BILITOT 0.2* 0.4  PROT 6.4* 5.5*  ALBUMIN 2.8* 2.4*   No results for input(s): LIPASE, AMYLASE in the last 168 hours. No results for input(s): AMMONIA in the last 168 hours. Coagulation profile No results for input(s): INR, PROTIME in the last 168 hours.  CBC: Recent Labs  Lab 03/15/18 1645 03/16/18 0405  WBC 15.5* 11.5*  HGB 12.0* 10.7*  HCT 38.3* 34.4*  MCV 91.6 91.7  PLT 303 265   Cardiac Enzymes: No results for input(s): CKTOTAL, CKMB, CKMBINDEX, TROPONINI in the last 168 hours. BNP (last 3 results) No results for input(s): PROBNP in the last 8760 hours. CBG: No results for input(s): GLUCAP in the last 168 hours. D-Dimer: No results for input(s): DDIMER in the last 72 hours. Hgb A1c: No results for input(s): HGBA1C in the last 72 hours. Lipid Profile: No results for input(s): CHOL, HDL, LDLCALC, TRIG, CHOLHDL, LDLDIRECT in the last 72 hours. Thyroid function studies: No results for input(s): TSH, T4TOTAL, T3FREE, THYROIDAB in the last  72 hours.  Invalid input(s): FREET3 Anemia work up: No results for input(s): VITAMINB12, FOLATE, FERRITIN, TIBC, IRON, RETICCTPCT in the last 72 hours. Sepsis Labs: Recent Labs  Lab 03/15/18 1645 03/16/18 0405  WBC 15.5* 11.5*   Microbiology No results found for this or any previous visit (from the past 240 hour(s)).   Medications:   . acetaminophen  500 mg Oral Q6H  . atorvastatin  40 mg Oral Daily  . docusate sodium  100 mg Oral BID  . [START ON 03/17/2018] enoxaparin (LOVENOX) injection  40 mg Subcutaneous Q24H  . ezetimibe  10 mg Oral Daily  . gabapentin  200 mg Oral BID  . lisinopril  2.5 mg Oral Daily  . pantoprazole  40 mg Oral Daily   Continuous Infusions: . sodium chloride 50 mL/hr at 03/15/18 2153  .  ceFAZolin  (ANCEF) IV    . methocarbamol (ROBAXIN) IV       LOS: 1 day   Charlynne Cousins  [facility]   *Please refer to [email or web address], password [email or web address] to get updated schedule on who will round on this patient, as hospitalists switch teams weekly. If 7PM-7AM, please contact night-coverage at [email or web address], password [email or web address] for any overnight needs.  03/16/2018, 10:20 AM

## 2018-03-16 NOTE — Progress Notes (Signed)
0940 Received pt from PACU, A&O x4, hard of hearing. Left hip dressing dry and intact. Ice pack in place.

## 2018-03-16 NOTE — Plan of Care (Signed)
  Problem: Education: Goal: Knowledge of General Education information will improve Description Including pain rating scale, medication(s)/side effects and non-pharmacologic comfort measures Outcome: Progressing   Problem: Activity: Goal: Risk for activity intolerance will decrease Outcome: Progressing   Problem: Pain Managment: Goal: General experience of comfort will improve Outcome: Progressing   Problem: Safety: Goal: Ability to remain free from injury will improve Outcome: Progressing   Problem: Clinical Measurements: Goal: Will remain free from infection Outcome: Progressing

## 2018-03-16 NOTE — Consult Note (Signed)
ORTHOPAEDIC CONSULTATION  REQUESTING PHYSICIAN: Charlynne Cousins, MD  Chief Complaint: Left hip pain  [name] is a 73 y.o. male who complains of left hip pain that started about 8 weeks ago but worsened yesterday.  He did have images previously including CT which did not show fracture.  But new images do show a left femoral neck fracture.  Orthopedics was consulted for evaluation.  This morning his pain is under control.  He has metastatic cancer and is not undergoing treatment due to advanced nature of same.  He is a current smoker.  Previously ambulatory.  Denies history of DVT/PE.   Past Medical History:  Diagnosis Date  . Adenocarcinoma of cecum ([place]) 07/01/2015  . 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 CAD (coronary artery disease) RCA non dominant vessel, LAD 40%, 80-90% 2nd diag EF 55%  01/19/2013   Dr. Claiborne Billings [place] 03-05-15 Epic.  . Family history of colon cancer   . Family history of stomach cancer   . Gastric adenocarcinoma ([place]) 06/29/2015  . Gastric ulcer    many yrs ago  . GERD (gastroesophageal reflux disease)   . Hyperlipidemia LDL goal < 70 01/19/2013  . Hypertension   . Hypokalemia 01/20/2013  . Metabolic syndrome, with mildly elevated HgBA1C 01/20/2013  . Pulmonary nodules 01/19/2013  . STEMI (ST elevation myocardial infarction), 01/19/13 01/19/2013  . Tobacco abuse 01/20/2013  . Transfusion history    with gastric ulcer- many yrs ago   Past Surgical History:  Procedure Laterality Date  . APPENDECTOMY    . BIOPSY  10/05/2016   Procedure: BIOPSY;  Surgeon: Daneil Dolin, MD;  Location: [facility];  Service: Endoscopy;;  sigmoid anastomosis  . CATARACT EXTRACTION, BILATERAL    . COLONOSCOPY N/A 06/10/2015   RMR: cecal neoplasm  ? biopsied. large polyp at the hepatic flexure removed with piecmeal polypectomy and APC ablation. lesion tattooed. Large polyp in the mid sigmoid status post piecemeal hot snare debulking and  tattooing. this lesion not completely removed. scatterd pancolonic diverticulsosis.   Marland Kitchen ESOPHAGOGASTRODUODENOSCOPY N/A 06/10/2015   Procedure: ESOPHAGOGASTRODUODENOSCOPY (EGD);  Surgeon: Daneil Dolin, MD;  Location: [facility];  Service: Endoscopy;  Laterality: N/A;  . ESOPHAGOGASTRODUODENOSCOPY N/A 06/17/2015   RMR: normal esophagus small hiatal hernia. Abnormal nodular antrum .pyloric channel status post biopsy.   Marland Kitchen FLEXIBLE SIGMOIDOSCOPY N/A 10/05/2016   Dr. Gala Romney: stenotic sigmoid ileal anastomosis with abnormal appearing mucosa with appearance like scar tissue, s/p biopsy. biopsies with colonic mucosa and patchy ulceration, acute and chronic inflammation.   Marland Kitchen HERNIA REPAIR Right   . LAPAROSCOPIC SUBTOTAL COLECTOMY N/A 08/02/2015   Procedure: LAPAROSCOPIC SUBTOTAL COLECTOMY  AND DISTAL GASTRECTOMY ;  Surgeon: Stark Klein, MD;  Location: [facility];  Service: General;  Laterality: N/A;  . LEFT HEART CATH N/A 01/19/2013   Procedure: LEFT HEART CATH;  Surgeon: Troy Sine, MD;  Location: [facility] CATH LAB;  Service: Cardiovascular;  Laterality: N/A;   Social History   Socioeconomic History  . Marital status: Widowed    Spouse name: Not on file  . Number of children: 0  . Years of education: Not on file  . Highest education level: Not on file  Occupational History  . Occupation: farmer  Social Needs  . Financial resource strain: Not on file  . Food insecurity:    Worry: Not on file    Inability: Not on file  . Transportation needs:    Medical: Not on file  Non-medical: Not on file  Tobacco Use  . Smoking status: Current Every Day Smoker    Packs/day: 1.00    Years: 58.00    Pack years: 58.00    Types: Cigarettes    Start date: 01/21/1963  . Smokeless tobacco: Never Used  . Tobacco comment: one pack daily  Substance and Sexual Activity  . Alcohol use: No    Alcohol/week: 0.0 standard drinks    Comment: none in 15 yrs -heavy user-none now.  . Drug use: No  . Sexual activity: Not  on file  Lifestyle  . Physical activity:    Days per week: Not on file    Minutes per session: Not on file  . Stress: Not on file  Relationships  . Social connections:    Talks on phone: Not on file    Gets together: Not on file    Attends religious service: Not on file    Active member of club or organization: Not on file    Attends meetings of clubs or organizations: Not on file    Relationship status: Not on file  Other Topics Concern  . Not on file  Social History Narrative  . Not on file   Family History  Problem Relation Age of Onset  . Heart attack Mother   . Colon cancer Father        dx in his 49s  . Colon cancer Sister        dx possibly in her early 40s  . Stomach cancer Sister        dx in her 79s  . Colon cancer Paternal Uncle    Allergies  Allergen Reactions  . Contrast Media [Iodinated Diagnostic Agents] Itching and Rash   Prior to Admission medications   Medication Sig Start Date End Date Taking? Authorizing Provider  acetaminophen (TYLENOL) 325 MG tablet Take 650 mg by mouth daily as needed for moderate pain or headache.   Yes [provider]  aspirin 81 MG EC tablet Take 81 mg by mouth daily. 11/24/15  Yes [provider]  atorvastatin (LIPITOR) 40 MG tablet Take 1 tablet (40 mg total) by mouth daily. 10/31/17  Yes Dettinger, Fransisca Kaufmann, MD  clopidogrel (PLAVIX) 75 MG tablet TAKE 1 TABLET BY MOUTH DAILY WITH BREAKFEST Patient taking differently: Take 75 mg by mouth daily.  01/21/18  Yes Wellington Hampshire, MD  diphenoxylate-atropine (LOMOTIL) 2.5-0.025 MG tablet TAKE 1 TO 2 TABLETS 4 TIMES DAILY AS NEEDED FOR DIARRHEA 10/08/17  Yes Annitta Needs, NP  ezetimibe (ZETIA) 10 MG tablet Take 10 mg by mouth daily. 12/30/17  Yes [provider]  lidocaine-prilocaine (EMLA) cream APPLY A QUARTER SIZE AMOUNT TO PORT SITE 1 HOUR PRIOR TO CHEMO. DO NOT RUB IN. COVER WITH PLASTIC WRAP. 09/04/15  Yes [provider]  lisinopril  (PRINIVIL,ZESTRIL) 5 MG tablet Take 0.5 tablets (2.5 mg total) by mouth daily. 02/07/18  Yes Troy Sine, MD  ondansetron (ZOFRAN) 8 MG tablet Take 1 tablet (8 mg total) by mouth every 8 (eight) hours as needed for nausea or vomiting. 09/04/15  Yes Penland, Kelby Fam, MD  pantoprazole (PROTONIX) 40 MG tablet TAKE 1 TABLET BY MOUTH DAILY AT 6:00 A.M. Patient taking differently: Take 40 mg by mouth daily.  10/31/17  Yes Dettinger, Fransisca Kaufmann, MD  prochlorperazine (COMPAZINE) 10 MG tablet Take 1 tablet (10 mg total) by mouth every 6 (six) hours as needed for nausea or vomiting. Patient not taking: Reported on 03/15/2018  09/04/15   Penland, Kelby Fam, MD  traMADol (ULTRAM) 50 MG tablet Take 1 tablet (50 mg total) by mouth every 6 (six) hours as needed. Patient not taking: Reported on 03/15/2018 01/16/18   Carmin Muskrat, MD   Dg Chest 2 View  Result Date: 03/15/2018 CLINICAL DATA:  Left hip pain first weeks, difficulty walking, injury getting above a couch at home, felt a pop in LEFT hip; history of adenocarcinoma of the rectosigmoid junction, gastric adenocarcinoma, smoker, coronary artery disease post MI, GERD EXAM: CHEST - 2 VIEW COMPARISON:  PET-CT 03/26/2017 FINDINGS: RIGHT jugular Port-A-Cath with tip projecting over cavoatrial junction. Normal heart size, mediastinal contours, and pulmonary vascularity. Atherosclerotic calcifications aorta. Multiple pulmonary nodules are identified, largest lateral mid RIGHT lung 3.5 x 3.0 cm increased from previous exam. Additional LEFT perihilar/hilar nodule 3.0 x 2.7 cm, increased from previous exam. Findings are consistent with progressive pulmonary metastatic disease. Increased number of pulmonary nodule since prior study. No acute infiltrate, pleural effusion or pneumothorax. Degenerative changes of the shoulder joints bilaterally. IMPRESSION: Progressive pulmonary metastatic disease since 03/26/2017. Electronically Signed   By: Lavonia Dana M.D.   On: 03/15/2018  17:42   Dg Hip Unilat W Or Wo Pelvis 2-3 Views Left  Result Date: 03/15/2018 CLINICAL DATA:  Left hip pain. EXAM: DG HIP (WITH OR WITHOUT PELVIS) 2-3V LEFT COMPARISON:  January 16, 2018 FINDINGS: There is displaced fracture of the left femoral neck. No other acute fracture dislocation is identified. IMPRESSION: Displaced fracture of left femoral neck. Electronically Signed   By: Abelardo Diesel M.D.   On: 03/15/2018 20:37    Positive ROS: All other systems have been reviewed and were otherwise negative with the exception of those mentioned in the HPI and as above.  Objective: Labs cbc Recent Labs    03/15/18 1645 03/16/18 0405  WBC 15.5* 11.5*  HGB 12.0* 10.7*  HCT 38.3* 34.4*  PLT 303 265    Labs inflam No results for input(s): CRP in the last 72 hours.  Invalid input(s): ESR  Labs coag No results for input(s): INR, PTT in the last 72 hours.  Invalid input(s): PT  Recent Labs    03/15/18 1645 03/16/18 0405  NA 141 140  K 3.9 4.3  CL 106 105  CO2 25 24  GLUCOSE 102* 96  BUN 24* 22  CREATININE 1.15 1.06  CALCIUM 8.8* 8.4*    Physical Exam: Vitals:   03/15/18 2054 03/16/18 0447  BP: (!) 166/70 116/64  Pulse: 63 65  Resp: 18 18  Temp: 98.3 F (36.8 C) 98.4 F (36.9 C)  SpO2: 100% 99%   General: Alert, no acute distress.  Supine in bed.  Calm, conversant.  Wife at bedside. Mental status: Alert and Oriented x3 Neurologic: Speech Clear and organized, no gross focal findings or movement disorder appreciated. Respiratory: No cyanosis, no use of accessory musculature Cardiovascular: No pedal edema GI: Abdomen is soft and non-tender, non-distended. Skin: Warm and dry.  No lesions in the area of chief complaint . Extremities: Warm and well perfused w/o edema Psychiatric: Patient is competent for consent with normal mood and affect  MUSCULOSKELETAL:  LLE: Pain with range of motion left hip.  EHL, FHL, dorsiflexion, plantarflexion intact.  Sensation intact  distally.  Feet warm. Other extremities are atraumatic with painless ROM and NVI.  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 Edmonia Lynch.  We discussed pinning versus hemiarthroplasty including reasons for each and risks/benefits of each.  He agrees with plan for hip pinning.  -NPO  -Medicine team to admit and perform pre-op clearance -PT/OT post op -Foley okay prn for comfort  -Likely to require Rehab or SNF placement upon discharge.   The risks benefits and alternatives of the procedure were discussed with the patient including but not limited to infection, bleeding, nerve injury, the need for revision surgery, blood clots, cardiopulmonary complications, morbidity, mortality, among others.  The patient verbalizes understanding and wishes to proceed.     Weightbearing: Bedrest.  Will amend post op. VTE prophylaxis:. SCDs.  Likely Lovenox postoperatively. Pain control: Minimize narcotics if able. Follow-up plan: Will follow in acute inpatient post-op phase.  Plan for outpatient follow up with Dr. Alain Marion in about 2 weeks. Contact information:  Edmonia Lynch MD, Roxan Hockey PA-C  Prudencio Burly III PA-C 03/16/2018 5:36 AM

## 2018-03-16 NOTE — Progress Notes (Signed)
RN verified the presence of a signed informed consent that matches stated procedure by patient. Verified armband matches patient's stated name and birth date. Verified NPO status and that all jewelry, contact, glasses, dentures, and partials had been removed (if applicable). Patient removed dentures in Short Stay and give them to wife.

## 2018-03-16 NOTE — Interval H&P Note (Signed)
I agree with the above note.  I had a long discussion with patient and his friend/caregiver I think it is likely despite a slight varus displacement that percutaneous fixation of his fracture could hold him stable enough for ambulating and prevent collapse of the femoral head.  He understands that there is risk of need for revision to a hemiarthroplasty in the future in light of that he would like to go forward with CRP P of his femoral neck today.  We will do partial weightbearing for the first few weeks and then progress to full weightbearing.

## 2018-03-17 LAB — CBC
HCT: 34.6 % — ABNORMAL LOW (ref 39.0–52.0)
Hemoglobin: 11.2 g/dL — ABNORMAL LOW (ref 13.0–17.0)
MCH: 29.6 pg (ref 26.0–34.0)
MCHC: 32.4 g/dL (ref 30.0–36.0)
MCV: 91.3 fL (ref 80.0–100.0)
Platelets: 290 10*3/uL (ref 150–400)
RBC: 3.79 MIL/uL — ABNORMAL LOW (ref 4.22–5.81)
RDW: 13.2 % (ref 11.5–15.5)
WBC: 16.9 10*3/uL — ABNORMAL HIGH (ref 4.0–10.5)
nRBC: 0 % (ref 0.0–0.2)

## 2018-03-17 MED ORDER — POLYETHYLENE GLYCOL 3350 17 G PO PACK
17.0000 g | PACK | Freq: Every day | ORAL | Status: DC
  Filled 2018-03-17: qty 1

## 2018-03-17 NOTE — Care Management Note (Signed)
Case Management Note  Patient Details  Name: Steven Drake MRN: [id number] Date of Birth: [birth year]  Subjective/Objective:    Pt to return home with family assistance.  Pt states needs a 3n1 and and tub bench.  Pt agrees to [place] and OT.                 Action/Plan: Pt presented with Medicare rated list.  Pt chooses [facility], but unable to make referral.  Referral called to [facility] with [place].    Expected Discharge Date:  03/17/18               Expected Discharge Plan:  [place]  In-House Referral:  NA  Discharge planning Services  CM Consult  Post Acute Care Choice:  Durable Medical Equipment, Home Health Choice offered to:  Patient  DME Arranged:  3-N-1, Tub bench DME Agency:  [place] Arranged:  PT, OT [facility] Agency:  [place] Status of Service:  Completed, signed off  If discussed at [place] of Stay Meetings, dates discussed:    Additional Comments:  Claudie Leach, RN 03/17/2018, 11:19 AM

## 2018-03-17 NOTE — Evaluation (Signed)
Occupational Therapy Evaluation Patient Details Name: Steven Drake MRN: [id number] DOB: March 16, 1945 Today's Date: 03/17/2018    History of Present Illness 73 y.o. male with medical history significant of stomach cancer, metastatic colon cancer stage IV, hypertension, hyperlipidemia, coronary artery disease, pulmonary nodule suspected to be carcinoma who presents to the ER with left hip pain starting about 8 weeks ago but worsened 03/15/2018. CT showing no left femoral neck fx. S/p left hip pinning on 03/16/2018 and now WBAT.   Clinical Impression   PTA, pt was living alone and performed BADLs and used standard walker for mobility since injury ~8 wks ago. Pt currently required Min A for LB ADLs and Min Guard A for functional mobility with RW. Pt presenting with decreased strength, balance, and awareness. Educating pt on smoking and bone healing; pt stating "yeah, they told me that before surgery." Pt would benefit from further acute OT to address LB ADLs and functional transfers. Recommend dc to home once medically stable per physician.       Follow Up Recommendations  No OT follow up;Supervision/Assistance - 24 hour    Equipment Recommendations  3 in 1 bedside commode;Other (comment);Tub/shower seat(Reassess need for tub seat during tub transfer training)    Recommendations for Other Services PT consult     Precautions / Restrictions Precautions Precautions: Fall Restrictions Weight Bearing Restrictions: Yes LLE Weight Bearing: Weight bearing as tolerated      Mobility Bed Mobility Overal bed mobility: Needs Assistance Bed Mobility: Supine to Sit     Supine to sit: Min guard;HOB elevated     General bed mobility comments: Min Guard A for safety  Transfers Overall transfer level: Needs assistance Equipment used: Rolling walker (2 wheeled) Transfers: Sit to/from Stand Sit to Stand: Min guard         General transfer comment: Min Guard A for safety. Cues for hand  placement.    Balance Overall balance assessment: Needs assistance Sitting-balance support: No upper extremity supported;Feet supported Sitting balance-Leahy Scale: Fair     Standing balance support: No upper extremity supported;During functional activity Standing balance-Leahy Scale: Fair Standing balance comment: Able to maintain static standing                           ADL either performed or assessed with clinical judgement   ADL Overall ADL's : Needs assistance/impaired Eating/Feeding: Independent;Sitting   Grooming: Wash/dry hands;Supervision/safety;Set up;Standing   Upper Body Bathing: Supervision/ safety;Set up;Sitting   Lower Body Bathing: Minimal assistance;Sit to/from stand   Upper Body Dressing : Supervision/safety;Set up;Sitting Upper Body Dressing Details (indicate cue type and reason): Donning second gown like jacket Lower Body Dressing: Minimal assistance;Sit to/from stand   Toilet Transfer: Min guard;Ambulation;RW;Regular Toilet;Grab bars Toilet Transfer Details (indicate cue type and reason): Min Guard A for safety Toileting- Clothing Manipulation and Hygiene: Supervision/safety;Set up;Sitting/lateral lean       Functional mobility during ADLs: Min guard;Rolling walker General ADL Comments: Pt presenting with decreased balance, strength, and cognition. Pt with poor awareness and problem solving.      Vision         Perception     Praxis      Pertinent Vitals/Pain Pain Assessment: Faces Faces Pain Scale: Hurts a little bit Pain Location: LLE Pain Descriptors / Indicators: Constant;Discomfort Pain Intervention(s): Monitored during session;Repositioned     Hand Dominance Right   Extremity/Trunk Assessment Upper Extremity Assessment Upper Extremity Assessment: Overall WFL for tasks assessed   Lower  Extremity Assessment Lower Extremity Assessment: Defer to PT evaluation   Cervical / Trunk Assessment Cervical / Trunk Assessment:  Kyphotic   Communication Communication Communication: No difficulties   Cognition Arousal/Alertness: Awake/alert Behavior During Therapy: WFL for tasks assessed/performed Overall Cognitive Status: Within Functional Limits for tasks assessed Area of Impairment: Safety/judgement;Problem solving;Awareness                         Safety/Judgement: Decreased awareness of safety Awareness: Emergent Problem Solving: Slow processing;Requires verbal cues General Comments: Feel pt at baseline cognition. However, presenting with decreased awareness and problem solving.  During education of RW management, pt not recalling hand placement despite several cues.    General Comments  Friend/POA present throughout    Exercises     Shoulder Instructions      Home Living Family/patient expects to be discharged to:: Private residence Living Arrangements: Spouse/significant other Available Help at Discharge: Family;Available 24 hours/day Type of Home: Mobile home Home Access: Stairs to enter Entrance Stairs-Number of Steps: 2   Home Layout: One level     Bathroom Shower/Tub: Corporate investment banker: Standard     Home Equipment: None   Additional Comments: Has old DME from friends      Prior Functioning/Environment Level of Independence: Independent with assistive device(s)        Comments: Pt using standard walker after injury 8 wks ago. Performing BADLs. Friend performs IADLs. Pt continued to drive.         OT Problem List: Decreased activity tolerance;Decreased range of motion;Impaired balance (sitting and/or standing);Decreased knowledge of use of DME or AE;Decreased knowledge of precautions;Decreased safety awareness;Pain      OT Treatment/Interventions: Self-care/ADL training;Therapeutic exercise;Energy conservation;DME and/or AE instruction;Therapeutic activities;Patient/family education    OT Goals(Current goals can be found in the care plan  section) Acute Rehab OT Goals Patient Stated Goal: "Go home and have a cig" OT Goal Formulation: With patient Time For Goal Achievement: 03/31/18 Potential to Achieve Goals: Good ADL Goals Pt Will Perform Lower Body Dressing: with modified independence;sit to/from stand Pt Will Transfer to Toilet: with modified independence;bedside commode;ambulating Pt Will Perform Tub/Shower Transfer: Tub transfer;with min guard assist;ambulating;shower seat;rolling walker  OT Frequency: Min 3X/week   Barriers to D/C:            Co-evaluation PT/OT/SLP Co-Evaluation/Treatment: Yes Reason for Co-Treatment: For patient/therapist safety;To address functional/ADL transfers          AM-PAC OT "6 Clicks" Daily Activity     Outcome Measure Help from another person eating meals?: None Help from another person taking care of personal grooming?: A Little Help from another person toileting, which includes using toliet, bedpan, or urinal?: A Little Help from another person bathing (including washing, rinsing, drying)?: A Little Help from another person to put on and taking off regular upper body clothing?: None Help from another person to put on and taking off regular lower body clothing?: A Little 6 Click Score: 20   End of Session Equipment Utilized During Treatment: Gait belt;Rolling walker Nurse Communication: Mobility status  Activity Tolerance: Patient tolerated treatment well Patient left: in chair;with call bell/phone within reach;with chair alarm set;with family/visitor present  OT Visit Diagnosis: Unsteadiness on feet (R26.81);Other abnormalities of gait and mobility (R26.89);Muscle weakness (generalized) (M62.81);Other symptoms and signs involving cognitive function;Pain Pain - Right/Left: Left Pain - part of body: Leg                Time: [phone]  OT Time Calculation (min): 20 min Charges:  OT General Charges $OT Visit: 1 Visit OT Evaluation $OT Eval Moderate Complexity: [place], OTR/L Acute Rehab Pager: [phone] Office: [place] 03/17/2018, 10:16 AM

## 2018-03-17 NOTE — Progress Notes (Signed)
    Subjective: Patient reports pain as mild, improved.  Not yet mobilized, but eager to move.  Objective:   VITALS:   Vitals:   03/16/18 1500 03/16/18 1948 03/17/18 0017 03/17/18 0353  BP: 115/62 (!) 111/58 (!) 107/52 (!) 128/55  Pulse: 62 68 60 62  Resp: 15 14 16 18   Temp: 97.6 F (36.4 C) 97.7 F (36.5 C) 97.9 F (36.6 C) 98.1 F (36.7 C)  TempSrc: Oral Oral Oral Oral  SpO2: 99% 98% 100% 99%   CBC Latest Ref Rng & Units 03/17/2018 03/16/2018 03/15/2018  WBC 4.0 - 10.5 K/uL 16.9(H) 11.5(H) 15.5(H)  Hemoglobin 13.0 - 17.0 g/dL 11.2(L) 10.7(L) 12.0(L)  Hematocrit 39.0 - 52.0 % 34.6(L) 34.4(L) 38.3(L)  Platelets 150 - 400 K/uL 290 265 303   BMP Latest Ref Rng & Units 03/16/2018 03/15/2018 10/24/2017  Glucose 70 - 99 mg/dL 96 102(H) 115(H)  BUN 8 - 23 mg/dL 22 24(H) 15  Creatinine 0.61 - 1.24 mg/dL 1.06 1.15 1.14  BUN/Creat Ratio 10 - 24 - - -  Sodium 135 - 145 mmol/L 140 141 141  Potassium 3.5 - 5.1 mmol/L 4.3 3.9 4.0  Chloride 98 - 111 mmol/L 105 106 107  CO2 22 - 32 mmol/L 24 25 28   Calcium 8.9 - 10.3 mg/dL 8.4(L) 8.8(L) 8.7(L)   Intake/Output      01/18 0701 - 01/19 0700 01/19 0701 - 01/20 0700   P.O. 600 360   I.V. 2354.8    IV Piggyback 0    Total Intake 2954.8 360   Urine 1000    Stool 0    Blood 25    Total Output 1025    Net +1929.8 +360        Urine Occurrence  100 x   Stool Occurrence 1 x 1 x      Physical Exam: General: NAD.  Upright in bed.  Calm, conversant.  Wife at bedside.   MSK LLE: Neurovascularly intact Sensation intact distally Feet warm.  Dorsiflexion/Plantar flexion intact Incision: dressing C/D/I   Assessment: 1 Day Post-Op  S/P Procedure(s) (LRB): LEFT HIP PINNING WITH STRYKER (Left) by Dr. Ernesta Amble. 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 Pain mild and  improved after surgery. Not yet mobilized but eager to do so.  Plan: Up with therapy Incentive Spirometry Apply ice prn  Weightbearing: WBAT LLE Insicional and dressing care: Reinforce dressings as needed Showering: Keep dressing dry VTE prophylaxis: Lovenox 40mg  qd, SCDs, ambulation Pain control: Tylenol.  Minimize Narcotics. Follow - up plan: 2 weeks Contact information:  Edmonia Lynch MD, Roxan Hockey PA-C  Dispo: TBD.  Patient has goal of returning to home.   D/C when mobilized and ready medically.   Charna Elizabeth Martensen III, PA-C 03/17/2018, 9:04 AM

## 2018-03-17 NOTE — Discharge Summary (Signed)
Physician Discharge Summary  Steven Drake ZGY:[id number] DOB: 1945/04/22 DOA: 03/15/2018  PCP: Dettinger, Fransisca Kaufmann, MD  Admit date: 03/15/2018 Discharge date: 03/17/2018  Admitted From: Home Disposition:  Home  Recommendations for Outpatient Follow-up:  1. Follow up with orthopedic in 2 weeks 2. Please obtain a CBC. 3. We will go home with home health and physical therapy.  Home Health:Yes  Equipment/Devices:None  Discharge Condition:stable CODE STATUS:Full Diet recommendation: Heart Healthy   Brief/Interim Summary: 73 y.o. male past medical history of stomach cancer, metastatic colon cancer stage IV hyperlipidemia coronary artery disease pulmonary nodule suspected to be carcinoma presents to the ED after left hip pain that has been present for several days prior to admission after visiting several doctors a CT scan of the hip was done the day prior to admission that showed a left femoral neck fracture and he was admitted for surgical intervention  Discharge Diagnoses:  Principal Problem:   Closed left femoral fracture ([place]) Active Problems:   Hyperlipidemia with target LDL less than 70   Tobacco abuse   CAD in native artery   Gastric adenocarcinoma ([place])   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 Orthopedic surgery was consulted recommended surgical intervention which was done on 03/16/2018 he is status post left hip pinning with a Stryker. Physical therapy evaluated the patient recommended home health PT. Hemoglobin was checked and remained stable.  Metastatic colon cancer: We will need further follow-up as an outpatient.  Tobacco abuse: He was placed on a nicotine patch.  Essential hypertension: No changes were made to his medication.   Discharge Instructions  Discharge Instructions    Diet - low sodium heart healthy   Complete by:  As directed    Increase activity slowly    Complete by:  As directed      Allergies as of 03/17/2018      Reactions   Contrast Media [iodinated Diagnostic Agents] Itching, Rash      Medication List    TAKE these medications   acetaminophen 325 MG tablet Commonly known as:  TYLENOL Take 650 mg by mouth daily as needed for moderate pain or headache.   aspirin 81 MG EC tablet Take 81 mg by mouth daily.   atorvastatin 40 MG tablet Commonly known as:  LIPITOR Take 1 tablet (40 mg total) by mouth daily.   clopidogrel 75 MG tablet Commonly known as:  PLAVIX TAKE 1 TABLET BY MOUTH DAILY WITH BREAKFEST What changed:  See the new instructions.   diphenoxylate-atropine 2.5-0.025 MG tablet Commonly known as:  LOMOTIL TAKE 1 TO 2 TABLETS 4 TIMES DAILY AS NEEDED FOR DIARRHEA   enoxaparin 40 MG/0.4ML injection Commonly known as:  LOVENOX Inject 0.4 mLs (40 mg total) into the skin daily for 30 doses. For 30 days post op for DVT prophylaxis   ezetimibe 10 MG tablet Commonly known as:  ZETIA Take 10 mg by mouth daily.   HYDROcodone-acetaminophen 5-325 MG tablet Commonly known as:  NORCO Take 1 tablet by mouth every 6 (six) hours as needed for severe pain.   lidocaine-prilocaine cream Commonly known as:  EMLA APPLY A QUARTER SIZE AMOUNT TO PORT SITE 1 HOUR PRIOR TO CHEMO. DO NOT RUB IN. COVER WITH PLASTIC WRAP.   lisinopril 5 MG tablet Commonly known as:  PRINIVIL,ZESTRIL Take 0.5 tablets (2.5 mg total) by mouth daily.   ondansetron 8 MG tablet Commonly known as:  ZOFRAN Take 1 tablet (8  mg total) by mouth every 8 (eight) hours as needed for nausea or vomiting.   pantoprazole 40 MG tablet Commonly known as:  PROTONIX TAKE 1 TABLET BY MOUTH DAILY AT 6:00 A.M. What changed:    how much to take  how to take this  when to take this  additional instructions   prochlorperazine 10 MG tablet Commonly known as:  COMPAZINE Take 1 tablet (10 mg total) by mouth every 6 (six) hours as needed for nausea or vomiting.    traMADol 50 MG tablet Commonly known as:  ULTRAM Take 1 tablet (50 mg total) by mouth every 6 (six) hours as needed.      Follow-up Information    Renette Butters, MD In 2 weeks.   Specialty:  Orthopedic Surgery Contact information: [address] [place] [address] [phone]          Allergies  Allergen Reactions  . Contrast Media [Iodinated Diagnostic Agents] Itching and Rash    Consultations: Orthopedics  Procedures/Studies: Dg Chest 2 View  Result Date: 03/15/2018 CLINICAL DATA:  Left hip pain first weeks, difficulty walking, injury getting above a couch at home, felt a pop in LEFT hip; history of adenocarcinoma of the rectosigmoid junction, gastric adenocarcinoma, smoker, coronary artery disease post MI, GERD EXAM: CHEST - 2 VIEW COMPARISON:  PET-CT 03/26/2017 FINDINGS: RIGHT jugular Port-A-Cath with tip projecting over cavoatrial junction. Normal heart size, mediastinal contours, and pulmonary vascularity. Atherosclerotic calcifications aorta. Multiple pulmonary nodules are identified, largest lateral mid RIGHT lung 3.5 x 3.0 cm increased from previous exam. Additional LEFT perihilar/hilar nodule 3.0 x 2.7 cm, increased from previous exam. Findings are consistent with progressive pulmonary metastatic disease. Increased number of pulmonary nodule since prior study. No acute infiltrate, pleural effusion or pneumothorax. Degenerative changes of the shoulder joints bilaterally. IMPRESSION: Progressive pulmonary metastatic disease since 03/26/2017. Electronically Signed   By: Lavonia Dana M.D.   On: 03/15/2018 17:42   Dg C-arm 1-60 Min  Result Date: 03/16/2018 CLINICAL DATA:  ORIF left hip fracture EXAM: DG C-ARM 61-120 MIN COMPARISON:  03/15/2018 left hip radiographs FINDINGS: Fluoroscopy time 0 minutes 32 seconds. Two spot fluoroscopic intraoperative left hip radiographs demonstrate transfixation of left femoral neck fracture by 3 pins, which appears  in anatomic alignment on these views. IMPRESSION: Intraoperative fluoroscopic guidance for ORIF left femoral neck fracture. Electronically Signed   By: Ilona Sorrel M.D.   On: 03/16/2018 08:40   Dg Hip Operative Unilat W Or W/o Pelvis Left  Result Date: 03/16/2018 CLINICAL DATA:  Hip fracture fixation EXAM: OPERATIVE left HIP (WITH PELVIS IF PERFORMED) 2  VIEWS TECHNIQUE: Fluoroscopic spot image(s) were submitted for interpretation post-operatively. COMPARISON:  03/15/2018 FINDINGS: Left femoral neck fracture fixed with 3 screws. Fracture alignment satisfactory. IMPRESSION: Screw fixation of left femoral neck fracture. Electronically Signed   By: Franchot Gallo M.D.   On: 03/16/2018 08:36   Dg Hip Unilat W Or Wo Pelvis 2-3 Views Left  Result Date: 03/15/2018 CLINICAL DATA:  Left hip pain. EXAM: DG HIP (WITH OR WITHOUT PELVIS) 2-3V LEFT COMPARISON:  January 16, 2018 FINDINGS: There is displaced fracture of the left femoral neck. No other acute fracture dislocation is identified. IMPRESSION: Displaced fracture of left femoral neck. Electronically Signed   By: Abelardo Diesel M.D.   On: 03/15/2018 20:37    Subjective: No complaints.  Discharge Exam: Vitals:   03/17/18 0017 03/17/18 0353  BP: (!) 107/52 (!) 128/55  Pulse: 60 62  Resp: 16  18  Temp: 97.9 F (36.6 C) 98.1 F (36.7 C)  SpO2: 100% 99%   Vitals:   03/16/18 1500 03/16/18 1948 03/17/18 0017 03/17/18 0353  BP: 115/62 (!) 111/58 (!) 107/52 (!) 128/55  Pulse: 62 68 60 62  Resp: 15 14 16 18   Temp: 97.6 F (36.4 C) 97.7 F (36.5 C) 97.9 F (36.6 C) 98.1 F (36.7 C)  TempSrc: Oral Oral Oral Oral  SpO2: 99% 98% 100% 99%    General: Pt is alert, awake, not in acute distress Cardiovascular: RRR, S1/S2 +, no rubs, no gallops Respiratory: CTA bilaterally, no wheezing, no rhonchi Abdominal: Soft, NT, ND, bowel sounds + Extremities: no edema, no cyanosis    The results of significant diagnostics from this hospitalization  (including imaging, microbiology, ancillary and laboratory) are listed below for reference.     Microbiology: Recent Results (from the past 240 hour(s))  MRSA PCR Screening     Status: None   Collection Time: 03/16/18  6:32 AM  Result Value Ref Range Status   MRSA by PCR NEGATIVE NEGATIVE Final    Comment:        The GeneXpert MRSA Assay (FDA approved for NASAL specimens only), is one component of a comprehensive MRSA colonization surveillance program. It is not intended to diagnose MRSA infection nor to guide or monitor treatment for MRSA infections. Performed at [facility], [place] [address], [place], [place] [address]      Labs: BNP (last 3 results) No results for input(s): BNP in the last 8760 hours. Basic Metabolic Panel: Recent Labs  Lab 03/15/18 1645 03/16/18 0405  NA 141 140  K 3.9 4.3  CL 106 105  CO2 25 24  GLUCOSE 102* 96  BUN 24* 22  CREATININE 1.15 1.06  CALCIUM 8.8* 8.4*   Liver Function Tests: Recent Labs  Lab 03/15/18 1645 03/16/18 0405  AST 25 17  ALT 35 27  ALKPHOS 129* 120  BILITOT 0.2* 0.4  PROT 6.4* 5.5*  ALBUMIN 2.8* 2.4*   No results for input(s): LIPASE, AMYLASE in the last 168 hours. No results for input(s): AMMONIA in the last 168 hours. CBC: Recent Labs  Lab 03/15/18 1645 03/16/18 0405 03/17/18 0612  WBC 15.5* 11.5* 16.9*  HGB 12.0* 10.7* 11.2*  HCT 38.3* 34.4* 34.6*  MCV 91.6 91.7 91.3  PLT 303 265 290   Cardiac Enzymes: No results for input(s): CKTOTAL, CKMB, CKMBINDEX, TROPONINI in the last 168 hours. BNP: Invalid input(s): POCBNP CBG: No results for input(s): GLUCAP in the last 168 hours. D-Dimer No results for input(s): DDIMER in the last 72 hours. Hgb A1c No results for input(s): HGBA1C in the last 72 hours. Lipid Profile No results for input(s): CHOL, HDL, LDLCALC, TRIG, CHOLHDL, LDLDIRECT in the last 72 hours. Thyroid function studies No results for input(s): TSH, T4TOTAL, T3FREE, THYROIDAB in the  last 72 hours.  Invalid input(s): FREET3 Anemia work up No results for input(s): VITAMINB12, FOLATE, FERRITIN, TIBC, IRON, RETICCTPCT in the last 72 hours. Urinalysis No results found for: COLORURINE, APPEARANCEUR, [place], [place], [place], [place], [place], [place], PROTEINUR, UROBILINOGEN, NITRITE, LEUKOCYTESUR Sepsis Labs Invalid input(s): PROCALCITONIN,  WBC,  LACTICIDVEN Microbiology Recent Results (from the past 240 hour(s))  MRSA PCR Screening     Status: None   Collection Time: 03/16/18  6:32 AM  Result Value Ref Range Status   MRSA by PCR NEGATIVE NEGATIVE Final    Comment:        The GeneXpert MRSA Assay (FDA approved for  NASAL specimens only), is one component of a comprehensive MRSA colonization surveillance program. It is not intended to diagnose MRSA infection nor to guide or monitor treatment for MRSA infections. Performed at [facility], [place] [address], [place], [place] [address]      Time coordinating discharge: 40 minutes  SIGNED:   Charlynne Cousins, MD  [facility] 03/17/2018, 10:32 AM Pager   If 7PM-7AM, please contact night-coverage [email or web address] Password [email or web address]

## 2018-03-17 NOTE — Evaluation (Signed)
Physical Therapy Evaluation Patient Details Name: Steven Drake MRN: [id number] DOB: [birth year] Today's Date: 03/17/2018   History of Present Illness  73 y.o. male with medical history significant of stomach cancer, metastatic colon cancer stage IV, hypertension, hyperlipidemia, coronary artery disease, pulmonary nodule suspected to be carcinoma who presents to the ER with left hip pain starting about 8 weeks ago but worsened 03/15/2018. CT showing no left femoral neck fx. S/p left hip pinning on 03/16/2018 and now WBAT.  Clinical Impression  PTA pt using standard walker for ambulation due to increasing L hip pain, taking bird baths and relying on friend for assist with iADLs. Pt currently limited by pain and related balance and strength deficits as well as decreased safety awareness. Pt requires min guard for bed mobility and transfers and was able to progress from min A to min guard with ambulation.  PT recommending HHPT level rehab at d/c. Pt has 2 steps to enter his home and PT will perform stair training in next session.     Follow Up Recommendations Home health PT;Supervision/Assistance - 24 hour    Equipment Recommendations  Rolling walker with 5" wheels;3in1 (PT)       Precautions / Restrictions Precautions Precautions: Fall Restrictions Weight Bearing Restrictions: Yes LLE Weight Bearing: Weight bearing as tolerated      Mobility  Bed Mobility Overal bed mobility: Needs Assistance Bed Mobility: Supine to Sit     Supine to sit: Min guard;HOB elevated     General bed mobility comments: Min Guard A for safety  Transfers Overall transfer level: Needs assistance Equipment used: Rolling walker (2 wheeled) Transfers: Sit to/from Stand Sit to Stand: Min guard         General transfer comment: Min Guard A for safety. Cues for hand placement.  Ambulation/Gait Ambulation/Gait assistance: Min assist;Min guard Gait Distance (Feet): 80 Feet Assistive device: Rolling walker  (2 wheeled) Gait Pattern/deviations: Step-through pattern;Decreased step length - right;Decreased step length - left;Shuffle;Antalgic;Trunk flexed Gait velocity: slowed Gait velocity interpretation: <1.8 ft/sec, indicate of risk for recurrent falls General Gait Details: minA progressing to min guard assist with RW, vc for proximity to RW and upright posture.         Balance Overall balance assessment: Needs assistance Sitting-balance support: No upper extremity supported;Feet supported Sitting balance-Leahy Scale: Fair     Standing balance support: No upper extremity supported;During functional activity Standing balance-Leahy Scale: Fair Standing balance comment: Able to maintain static standing                             Pertinent Vitals/Pain Pain Assessment: Faces Faces Pain Scale: Hurts a little bit Pain Location: LLE Pain Descriptors / Indicators: Constant;Discomfort Pain Intervention(s): Limited activity within patient's tolerance;Monitored during session;Premedicated before session;Repositioned    Home Living Family/patient expects to be discharged to:: Private residence Living Arrangements: Spouse/significant other Available Help at Discharge: Family;Available 24 hours/day Type of Home: Mobile home Home Access: Stairs to enter   Entrance Stairs-Number of Steps: 2 Home Layout: One level Home Equipment: None Additional Comments: Has old DME from friends    Prior Function Level of Independence: Independent with assistive device(s)         Comments: Pt using standard walker after injury 8 wks ago. Performing BADLs. Friend performs IADLs. Pt continued to drive.      Hand Dominance   Dominant Hand: Right    Extremity/Trunk Assessment   Upper Extremity Assessment Upper Extremity Assessment:  Overall [facility] for tasks assessed    Lower Extremity Assessment Lower Extremity Assessment: LLE deficits/detail LLE Deficits / Details: L hip pinning,  decreased hip ROM , knee and ankle ROM WFL LLE: Unable to fully assess due to pain    Cervical / Trunk Assessment Cervical / Trunk Assessment: Kyphotic  Communication   Communication: No difficulties  Cognition Arousal/Alertness: Awake/alert Behavior During Therapy: WFL for tasks assessed/performed Overall Cognitive Status: Within Functional Limits for tasks assessed Area of Impairment: Safety/judgement;Problem solving;Awareness                         Safety/Judgement: Decreased awareness of safety Awareness: Emergent Problem Solving: Slow processing;Requires verbal cues General Comments: Feel pt at baseline cognition. However, presenting with decreased awareness and problem solving.  During education of RW management, pt not recalling hand placement despite several cues.       General Comments General comments (skin integrity, edema, etc.): Friend/POA present throughout, dressing clean, dry and intact        Assessment/Plan    PT Assessment Patient needs continued PT services  PT Problem List Decreased range of motion;Decreased activity tolerance;Decreased balance;Decreased mobility;Decreased knowledge of use of DME;Decreased safety awareness;Pain       PT Treatment Interventions DME instruction;Gait training;Stair training;Functional mobility training;Therapeutic activities;Therapeutic exercise;Balance training;Cognitive remediation;Patient/family education    PT Goals (Current goals can be found in the Care Plan section)  Acute Rehab PT Goals Patient Stated Goal: "Go home and have a cig" PT Goal Formulation: With patient Time For Goal Achievement: 03/31/18 Potential to Achieve Goals: Good    Frequency Min 5X/week        Co-evaluation PT/OT/SLP Co-Evaluation/Treatment: Yes Reason for Co-Treatment: For patient/therapist safety PT goals addressed during session: Mobility/safety with mobility;Balance;Proper use of DME         AM-PAC PT "6 Clicks"  Mobility  Outcome Measure Help needed turning from your back to your side while in a flat bed without using bedrails?: A Little Help needed moving from lying on your back to sitting on the side of a flat bed without using bedrails?: A Little Help needed moving to and from a bed to a chair (including a wheelchair)?: A Little Help needed standing up from a chair using your arms (e.g., wheelchair or bedside chair)?: A Little Help needed to walk in hospital room?: A Little Help needed climbing 3-5 steps with a railing? : A Lot 6 Click Score: 17    End of Session   Activity Tolerance: Patient tolerated treatment well Patient left: in chair;with call bell/phone within reach;with chair alarm set;with family/visitor present Nurse Communication: Mobility status;Weight bearing status PT Visit Diagnosis: Unsteadiness on feet (R26.81);Other abnormalities of gait and mobility (R26.89);Muscle weakness (generalized) (M62.81);History of falling (Z91.81);Difficulty in walking, not elsewhere classified (R26.2);Pain Pain - Right/Left: Left Pain - part of body: Hip    Time: 2956-2130 PT Time Calculation (min) (ACUTE ONLY): 20 min   Charges:   PT Evaluation $PT Eval Moderate Complexity: 1 Mod          Kayvon Mo B. Migdalia Dk PT, DPT Acute Rehabilitation Services Pager [phone] Office [phone]   [place] 03/17/2018, 1:32 PM

## 2018-03-18 ENCOUNTER — Encounter (HOSPITAL_COMMUNITY): Payer: Self-pay | Admitting: Orthopedic Surgery

## 2018-03-18 LAB — CBC
HEMATOCRIT: 31.4 % — AB (ref 39.0–52.0)
Hemoglobin: 9.9 g/dL — ABNORMAL LOW (ref 13.0–17.0)
MCH: 29.2 pg (ref 26.0–34.0)
MCHC: 31.5 g/dL (ref 30.0–36.0)
MCV: 92.6 fL (ref 80.0–100.0)
Platelets: 245 10*3/uL (ref 150–400)
RBC: 3.39 MIL/uL — ABNORMAL LOW (ref 4.22–5.81)
RDW: 13.4 % (ref 11.5–15.5)
WBC: 12.1 10*3/uL — ABNORMAL HIGH (ref 4.0–10.5)
nRBC: 0 % (ref 0.0–0.2)

## 2018-03-18 NOTE — Progress Notes (Signed)
Pt given oral and written discharge instructions. Given hardscripts, 3N1 BSC, RW, and Tub bench. Pt assisted to vehicle by volunteer staff.

## 2018-03-18 NOTE — Progress Notes (Signed)
Physical Therapy Treatment Patient Details Name: Steven Drake MRN: [id number] DOB: 08/02/1945 Today's Date: 03/18/2018    History of Present Illness 73 y.o. male with medical history significant of stomach cancer, metastatic colon cancer stage IV, hypertension, hyperlipidemia, coronary artery disease, pulmonary nodule suspected to be carcinoma who presents to the ER with left hip pain starting about 8 weeks ago but worsened 03/15/2018. CT showing no left femoral neck fx. S/p left hip pinning on 03/16/2018 and now WBAT.    PT Comments    Pt making good progress towards his goals today. Pt currently limited by L hip pain and associated decreased strength and ROM. Pt requires min guard for bed mobility, transfers, ambulation of 120 feet with RW and ascent/descent of 2 steps with RW. D/c plans remain appropriate at this time. Pt to d/c home today.    Follow Up Recommendations  Home health PT;Supervision/Assistance - 24 hour     Equipment Recommendations  Rolling walker with 5" wheels;3in1 (PT)       Precautions / Restrictions Precautions Precautions: Fall Restrictions Weight Bearing Restrictions: Yes LLE Weight Bearing: Weight bearing as tolerated    Mobility  Bed Mobility Overal bed mobility: Needs Assistance Bed Mobility: Supine to Sit     Supine to sit: Min guard;HOB elevated     General bed mobility comments: Min Guard A for safety  Transfers Overall transfer level: Needs assistance Equipment used: Rolling walker (2 wheeled) Transfers: Sit to/from Stand Sit to Stand: Min guard         General transfer comment: Min Guard A for safety. Cues for hand placement.  Ambulation/Gait Ambulation/Gait assistance: Min guard Gait Distance (Feet): 120 Feet Assistive device: Rolling walker (2 wheeled) Gait Pattern/deviations: Step-through pattern;Decreased step length - right;Decreased step length - left;Shuffle;Antalgic;Trunk flexed Gait velocity: slowed Gait velocity  interpretation: <1.8 ft/sec, indicate of risk for recurrent falls General Gait Details: min guard for safety, vc for upright posture and proximity to RW   Stairs Stairs: Yes Stairs assistance: Min guard Stair Management: Two rails;Forwards;Backwards;With walker Number of Stairs: 2(2x2) General stair comments: min guard for ascent/descent of 2 steps with bilateral rails, attempted without rails to simulate home environment and unable to achieve, then trained for backwards ascent with RW able to achieve with min guard assist.       Balance Overall balance assessment: Needs assistance Sitting-balance support: No upper extremity supported;Feet supported Sitting balance-Leahy Scale: Fair     Standing balance support: No upper extremity supported;During functional activity Standing balance-Leahy Scale: Fair Standing balance comment: Able to maintain static standing                            Cognition Arousal/Alertness: Awake/alert Behavior During Therapy: WFL for tasks assessed/performed Overall Cognitive Status: Within Functional Limits for tasks assessed Area of Impairment: Safety/judgement;Problem solving;Awareness                         Safety/Judgement: Decreased awareness of safety Awareness: Emergent Problem Solving: Slow processing;Requires verbal cues General Comments: Feel pt at baseline cognition. However, presenting with decreased awareness and problem solving.  During education of RW management, pt not recalling hand placement despite several cues.       Exercises      General Comments General comments (skin integrity, edema, etc.): Friend/POA present throughout, dressing clean, dry and intact      Pertinent Vitals/Pain Pain Assessment: No/denies pain  PT Goals (current goals can now be found in the care plan section) Acute Rehab PT Goals Patient Stated Goal: "Go home and have a cig" PT Goal Formulation: With patient Time  For Goal Achievement: 03/31/18 Potential to Achieve Goals: Good Progress towards PT goals: Progressing toward goals    Frequency    Min 5X/week      PT Plan Current plan remains appropriate       AM-PAC PT "6 Clicks" Mobility   Outcome Measure  Help needed turning from your back to your side while in a flat bed without using bedrails?: A Little Help needed moving from lying on your back to sitting on the side of a flat bed without using bedrails?: A Little Help needed moving to and from a bed to a chair (including a wheelchair)?: A Little Help needed standing up from a chair using your arms (e.g., wheelchair or bedside chair)?: A Little Help needed to walk in hospital room?: A Little Help needed climbing 3-5 steps with a railing? : A Lot 6 Click Score: 17    End of Session Equipment Utilized During Treatment: Gait belt Activity Tolerance: Patient tolerated treatment well Patient left: in chair;with call bell/phone within reach;with chair alarm set;with family/visitor present Nurse Communication: Mobility status;Weight bearing status PT Visit Diagnosis: Unsteadiness on feet (R26.81);Other abnormalities of gait and mobility (R26.89);Muscle weakness (generalized) (M62.81);History of falling (Z91.81);Difficulty in walking, not elsewhere classified (R26.2);Pain Pain - Right/Left: Left Pain - part of body: Hip     Time: 2947-6546 PT Time Calculation (min) (ACUTE ONLY): 16 min  Charges:  $Gait Training: 8-22 mins                     Kymberlie Brazeau B. Migdalia Dk PT, DPT Acute Rehabilitation Services Pager [phone] Office [phone]    [place] 03/18/2018, 10:56 AM

## 2018-03-18 NOTE — Consult Note (Signed)
            [facility] CM Primary Care Navigator  03/18/2018  JAYLUN FLEENER 01/02/1946 [id number]   Went to seepatient at the bedside to identify possible discharge needs buthe wasalreadydischargedhomeper staff. Patient went home with home health services.  Per MD note,patientpresented to the ED for left hip pain that has been present for several days prior to admission after visiting several doctors, a CT scan of the hip showed a left femoral neck fracture and he was admitted for surgical intervention. (closed femoral neck fracture, status post left hip pinning with a Stryker)  Patient has discharge instruction to follow-up withorthopedics in 2 weeks.  Primary care provider's office is listed as providing transition of care (TOC).    For additional questions please contact:  Edwena Felty A. Travian Kerner, BSN, RN-BC [facility] PRIMARY CARE Navigator Cell: [phone]

## 2018-03-18 NOTE — Progress Notes (Signed)
[facility] PROGRESS NOTE    Progress Note  Steven Drake  ERD:[id number] DOB: June 21, 1945 DOA: 03/15/2018 PCP: Dettinger, Fransisca Kaufmann, MD     Brief Narrative:   Steven Drake is an 73 y.o. male past medical history of stomach cancer, metastatic colon cancer stage IV hyperlipidemia coronary artery disease pulmonary nodule suspected to be carcinoma presents to the ED after left hip pain that has been present for several days prior to admission after visiting several doctors a CT scan of the hip was done the day prior to admission that showed a left femoral neck fracture and he was admitted for surgical intervention  Assessment/Plan:   Closed left femoral fracture (HCC) X-ray of the hip showed displaced left femur fracture. Orthopedic surgery was consulted recommended surgical intervention which was done on 03/16/2018, status post left hip pinning with a Stryker Patient had Extra day and he has some stairs to go up to in his house and physical therapy cannot evaluate that the day before.  Also on the day of discharge he went up the stairs which he tolerated well.  No further recommendation by physical therapy.  Metastatic gastric and colon cancer: [place] up with oncology as an outpatient, the patient is not on any active treatment.  Tobacco abuse: Ongoing nicotine patch placed.  Essential hypertension: Resume antihypertensive medication regimen.  Hyperlipidemia with target LDL less than 70 Cont statins  DVT prophylaxis: heparin Family Communication:wife Disposition Plan/Barrier to D/C: Home with home health today. Code Status:     Code Status Orders  (From admission, onward)         Start     Ordered   03/15/18 2100  Full code  Continuous     03/15/18 2100        Code Status History    Date Active Date Inactive Code Status Order ID Comments User Context   03/15/2018 2013 03/15/2018 2100 DNR [id number]  Elwyn Reach, MD ED   01/19/2013 0627 01/21/2013 1714 Full  Code [id number]  Inez Pilgrim, MD Inpatient        IV Access:    Peripheral IV   Procedures and diagnostic studies:   No results found.   Medical Consultants:    None.  Anti-Infectives:   Cefazolin  Subjective:    Steven Drake patient is sleepy has no pain.  Objective:    Vitals:   03/18/18 0113 03/18/18 0200 03/18/18 0355 03/18/18 0400  BP:   115/67   Pulse:   64   Resp:   18 18  Temp: 98.3 F (36.8 C)  98.4 F (36.9 C)   TempSrc: Oral  Oral   SpO2:   99%   Weight:  75 kg    Height:  5\' 11"  (1.803 m)      Intake/Output Summary (Last 24 hours) at 03/18/2018 1129 Last data filed at 03/18/2018 1100 Gross per 24 hour  Intake 1120 ml  Output 200 ml  Net 920 ml   Filed Weights   03/18/18 0200  Weight: 75 kg    Exam: General exam: In no acute distress. Respiratory system: Good air movement and clear to auscultation. Cardiovascular system: S1 & S2 heard, RRR. Gastrointestinal system: Abdomen is nondistended, soft and nontender.  Central nervous system: Alert and oriented. No focal neurological deficits. Extremities: No pedal edema. Skin: No rashes, lesions or ulcers Psychiatry: Judgement and insight appear normal. Mood & affect appropriate.    Data Reviewed:    Labs: Basic Metabolic Panel: Recent  Labs  Lab 03/15/18 1645 03/16/18 0405  NA 141 140  K 3.9 4.3  CL 106 105  CO2 25 24  GLUCOSE 102* 96  BUN 24* 22  CREATININE 1.15 1.06  CALCIUM 8.8* 8.4*   GFR Estimated Creatinine Clearance: 64.9 mL/min (by C-G formula based on SCr of 1.06 mg/dL). Liver Function Tests: Recent Labs  Lab 03/15/18 1645 03/16/18 0405  AST 25 17  ALT 35 27  ALKPHOS 129* 120  BILITOT 0.2* 0.4  PROT 6.4* 5.5*  ALBUMIN 2.8* 2.4*   No results for input(s): LIPASE, AMYLASE in the last 168 hours. No results for input(s): AMMONIA in the last 168 hours. Coagulation profile No results for input(s): INR, PROTIME in the last 168 hours.  CBC: Recent Labs   Lab 03/15/18 1645 03/16/18 0405 03/17/18 0612 03/18/18 0410  WBC 15.5* 11.5* 16.9* 12.1*  HGB 12.0* 10.7* 11.2* 9.9*  HCT 38.3* 34.4* 34.6* 31.4*  MCV 91.6 91.7 91.3 92.6  PLT 303 265 290 245   Cardiac Enzymes: No results for input(s): CKTOTAL, CKMB, CKMBINDEX, TROPONINI in the last 168 hours. BNP (last 3 results) No results for input(s): PROBNP in the last 8760 hours. CBG: No results for input(s): GLUCAP in the last 168 hours. D-Dimer: No results for input(s): DDIMER in the last 72 hours. Hgb A1c: No results for input(s): HGBA1C in the last 72 hours. Lipid Profile: No results for input(s): CHOL, HDL, LDLCALC, TRIG, CHOLHDL, LDLDIRECT in the last 72 hours. Thyroid function studies: No results for input(s): TSH, T4TOTAL, T3FREE, THYROIDAB in the last 72 hours.  Invalid input(s): FREET3 Anemia work up: No results for input(s): VITAMINB12, FOLATE, FERRITIN, TIBC, IRON, RETICCTPCT in the last 72 hours. Sepsis Labs: Recent Labs  Lab 03/15/18 1645 03/16/18 0405 03/17/18 0612 03/18/18 0410  WBC 15.5* 11.5* 16.9* 12.1*   Microbiology Recent Results (from the past 240 hour(s))  MRSA PCR Screening     Status: None   Collection Time: 03/16/18  6:32 AM  Result Value Ref Range Status   MRSA by PCR NEGATIVE NEGATIVE Final    Comment:        The GeneXpert MRSA Assay (FDA approved for NASAL specimens only), is one component of a comprehensive MRSA colonization surveillance program. It is not intended to diagnose MRSA infection nor to guide or monitor treatment for MRSA infections. Performed at [facility], [place] [address], [place], [place] [address]      Medications:   . aspirin EC  81 mg Oral Daily  . atorvastatin  40 mg Oral Daily  . clopidogrel  75 mg Oral Daily  . docusate sodium  100 mg Oral BID  . enoxaparin (LOVENOX) injection  40 mg Subcutaneous Q24H  . ezetimibe  10 mg Oral Daily  . lisinopril  2.5 mg Oral Daily  . pantoprazole  40 mg Oral Daily    . polyethylene glycol  17 g Oral Daily   Continuous Infusions: . methocarbamol (ROBAXIN) IV       LOS: 3 days   Charlynne Cousins  [facility]   *Please refer to [email or web address], password [email or web address] to get updated schedule on who will round on this patient, as hospitalists switch teams weekly. If 7PM-7AM, please contact night-coverage at [email or web address], password [email or web address] for any overnight needs.  03/18/2018, 11:29 AM

## 2018-03-18 NOTE — Care Management Note (Signed)
Case Management Note  Patient Details  Name: Steven Drake MRN: [id number] Date of Birth: 01/21/1946  Subjective/Objective:                    Action/Plan:  Karel Jarvis with [facility] for walker. Expected Discharge Date:  03/18/18               Expected Discharge Plan:  [place]  In-House Referral:  NA  Discharge planning Services  CM Consult  Post Acute Care Choice:  Durable Medical Equipment, Home Health Choice offered to:  Patient  DME Arranged:  3-N-1, Tub bench, Walker rolling DME Agency:  [place] Arranged:  PT, OT [facility] Agency:  [place]  Status of Service:  Completed, signed off  If discussed at [place] of Stay Meetings, dates discussed:    Additional Comments:  Marilu Favre, RN 03/18/2018, 9:51 AM

## 2018-03-18 NOTE — Progress Notes (Signed)
    Subjective: Patient reports pain as mild. He mobilized well yesterday and plans to go home w/ HHPT.  Objective:   VITALS:   Vitals:   03/18/18 0113 03/18/18 0200 03/18/18 0355 03/18/18 0400  BP:   115/67   Pulse:   64   Resp:   18 18  Temp: 98.3 F (36.8 C)  98.4 F (36.9 C)   TempSrc: Oral  Oral   SpO2:   99%   Weight:  75 kg    Height:  5\' 11"  (1.803 m)     CBC Latest Ref Rng & Units 03/18/2018 03/17/2018 03/16/2018  WBC 4.0 - 10.5 K/uL 12.1(H) 16.9(H) 11.5(H)  Hemoglobin 13.0 - 17.0 g/dL 9.9(L) 11.2(L) 10.7(L)  Hematocrit 39.0 - 52.0 % 31.4(L) 34.6(L) 34.4(L)  Platelets 150 - 400 K/uL 245 290 265   BMP Latest Ref Rng & Units 03/16/2018 03/15/2018 10/24/2017  Glucose 70 - 99 mg/dL 96 102(H) 115(H)  BUN 8 - 23 mg/dL 22 24(H) 15  Creatinine 0.61 - 1.24 mg/dL 1.06 1.15 1.14  BUN/Creat Ratio 10 - 24 - - -  Sodium 135 - 145 mmol/L 140 141 141  Potassium 3.5 - 5.1 mmol/L 4.3 3.9 4.0  Chloride 98 - 111 mmol/L 105 106 107  CO2 22 - 32 mmol/L 24 25 28   Calcium 8.9 - 10.3 mg/dL 8.4(L) 8.8(L) 8.7(L)   Intake/Output      01/19 0701 - 01/20 0700 01/20 0701 - 01/21 0700   P.O. 1480    I.V. (mL/kg)     IV Piggyback 0    Total Intake(mL/kg) 1480 (19.7)    Urine (mL/kg/hr) 200 (0.1)    Stool     Blood     Total Output 200    Net +1280         Urine Occurrence 101 x    Stool Occurrence 1 x       Physical Exam: General: NAD.  Upright in bed.  Calm, conversant.  Wife at bedside.   MSK LLE: Neurovascularly intact Sensation intact distally Feet warm.  Dorsiflexion/Plantar flexion intact Incision: dressing C/D/I   Assessment: 2 Days Post-Op  S/P Procedure(s) (LRB): LEFT HIP PINNING WITH STRYKER (Left) by Dr. Ernesta Amble. Percell Miller on 03/16/2018  Principal Problem:   Closed left femoral fracture ([place]) Active Problems:   Hyperlipidemia with target LDL less than 70   Tobacco abuse   CAD in native artery   Gastric adenocarcinoma ([place])   Adenocarcinoma of rectosigmoid  junction (HCC)   Hypertension   Left femoral neck fracture, status post pinning Doing well postop day 2 Tolerating diet. Pain mild and  improved after surgery. Mobilized well yesterday.  Desires d/c home w/ HHPT.  Plan: Up with therapy Incentive Spirometry Apply ice prn  Weightbearing: WBAT LLE Insicional and dressing care: Reinforce dressings as needed Showering: Keep dressing dry VTE prophylaxis: Lovenox 40mg  qd, SCDs, ambulation Pain control: Tylenol.  Minimize Narcotics. Follow - up plan: 2 weeks Contact information:  Edmonia Lynch MD, Roxan Hockey PA-C  Dispo:  Plan to return to home home w/ HHPT.     Follow up in the office with Dr. Alain Marion in 2 weeks.  Please call with questions.   Steven Elizabeth Martensen III, PA-C 03/18/2018, 7:18 AM

## 2018-03-18 NOTE — Progress Notes (Signed)
Occupational Therapy Treatment Patient Details Name: Steven Drake MRN: [id number] DOB: 1945/10/17 Today's Date: 03/18/2018    History of present illness 73 y.o. male with medical history significant of stomach cancer, metastatic colon cancer stage IV, hypertension, hyperlipidemia, coronary artery disease, pulmonary nodule suspected to be carcinoma who presents to the ER with left hip pain starting about 8 weeks ago but worsened 03/15/2018. CT showing no left femoral neck fx. S/p left hip pinning on 03/16/2018 and now WBAT.   OT comments  Pt progressing towards OT goals. Able to complete tub transfer with tub bench in ortho gym at supervision level. Reviewed LB ADL with Pt and caregiver. Pt and caregiver with no further questions or concerns for ADL or transfers in home setting. Safe to dc from OT standpoint. Thank you for the opportunity to serve this patient.    Follow Up Recommendations  No OT follow up;Supervision/Assistance - 24 hour    Equipment Recommendations  Tub/shower bench;3 in 1 bedside commode    Recommendations for Other Services PT consult    Precautions / Restrictions Precautions Precautions: Fall Restrictions Weight Bearing Restrictions: Yes LLE Weight Bearing: Weight bearing as tolerated       Mobility Bed Mobility Overal bed mobility: Needs Assistance Bed Mobility: Sit to Supine     Supine to sit: Min guard;HOB elevated Sit to supine: Supervision   General bed mobility comments: Min Guard A for safety  Transfers Overall transfer level: Needs assistance Equipment used: Rolling walker (2 wheeled) Transfers: Sit to/from Stand Sit to Stand: Supervision         General transfer comment: supervision for safety    Balance Overall balance assessment: Needs assistance Sitting-balance support: No upper extremity supported;Feet supported Sitting balance-Leahy Scale: Fair     Standing balance support: No upper extremity supported;During functional  activity Standing balance-Leahy Scale: Fair Standing balance comment: Able to maintain static standing                           ADL either performed or assessed with clinical judgement   ADL Overall ADL's : Needs assistance/impaired                         Toilet Transfer: Supervision/safety;Ambulation;RW Toilet Transfer Details (indicate cue type and reason): good hand placement     Tub/ Shower Transfer: Tub transfer;Supervision/safety;Tub bench;Rolling walker Tub/Shower Transfer Details (indicate cue type and reason): practiced in the ortho gym         Vision       Perception     Praxis      Cognition Arousal/Alertness: Awake/alert Behavior During Therapy: [facility] for tasks assessed/performed Overall Cognitive Status: Within Functional Limits for tasks assessed Area of Impairment: Safety/judgement;Problem solving;Awareness                         Safety/Judgement: Decreased awareness of safety Awareness: Emergent Problem Solving: Slow processing;Requires verbal cues General Comments: Feel pt at baseline cognition. However, presenting with decreased awareness and problem solving.  During education of RW management, pt not recalling hand placement despite several cues.         Exercises     Shoulder Instructions       General Comments Friend/POA present throughout, dressing clean, dry and intact    Pertinent Vitals/ Pain       Pain Assessment: Faces Faces Pain Scale: Hurts a little bit Pain Location:  LLE Pain Descriptors / Indicators: Discomfort;Grimacing Pain Intervention(s): Repositioned;Monitored during session  Home Living                                          Prior Functioning/Environment              Frequency  Min 3X/week        Progress Toward Goals  OT Goals(current goals can now be found in the care plan section)  Progress towards OT goals: Progressing toward goals  Acute Rehab OT  Goals Patient Stated Goal: "Go home and have a cig" OT Goal Formulation: With patient Time For Goal Achievement: 03/31/18 Potential to Achieve Goals: Good  Plan Discharge plan remains appropriate;Frequency needs to be updated    Co-evaluation                 AM-PAC OT "6 Clicks" Daily Activity     Outcome Measure   Help from another person eating meals?: None Help from another person taking care of personal grooming?: A Little Help from another person toileting, which includes using toliet, bedpan, or urinal?: A Little Help from another person bathing (including washing, rinsing, drying)?: A Little Help from another person to put on and taking off regular upper body clothing?: None Help from another person to put on and taking off regular lower body clothing?: A Little 6 Click Score: 20    End of Session Equipment Utilized During Treatment: Gait belt;Rolling walker  OT Visit Diagnosis: Unsteadiness on feet (R26.81);Other abnormalities of gait and mobility (R26.89);Muscle weakness (generalized) (M62.81);Other symptoms and signs involving cognitive function;Pain Pain - Right/Left: Left Pain - part of body: Leg   Activity Tolerance Patient tolerated treatment well   Patient Left in bed;with call bell/phone within reach;with family/visitor present   Nurse Communication Mobility status        Time: 1038-1100 OT Time Calculation (min): 22 min  Charges: OT General Charges $OT Visit: 1 Visit OT Treatments $Self Care/Home Management : 8-22 mins Hulda Humphrey OTR/L Acute Rehabilitation Services Pager: [phone] Office: [place] 03/18/2018, 12:50 PM

## 2018-03-18 NOTE — Plan of Care (Signed)
  Problem: Education: Goal: Verbalization of understanding the information provided (i.e., activity precautions, restrictions, etc) will improve Outcome: Progressing   Problem: Clinical Measurements: Goal: Postoperative complications will be avoided or minimized Outcome: Progressing   Problem: Pain Management: Goal: Pain level will decrease Outcome: Progressing   

## 2018-04-01 DIAGNOSIS — S72002D Fracture of unspecified part of neck of left femur, subsequent encounter for closed fracture with routine healing: Secondary | ICD-10-CM | POA: Diagnosis not present

## 2018-04-16 DIAGNOSIS — C189 Malignant neoplasm of colon, unspecified: Secondary | ICD-10-CM | POA: Diagnosis not present

## 2018-04-16 DIAGNOSIS — C7800 Secondary malignant neoplasm of unspecified lung: Secondary | ICD-10-CM | POA: Diagnosis not present

## 2018-04-16 DIAGNOSIS — Z95828 Presence of other vascular implants and grafts: Secondary | ICD-10-CM | POA: Diagnosis not present

## 2018-04-16 DIAGNOSIS — R222 Localized swelling, mass and lump, trunk: Secondary | ICD-10-CM | POA: Diagnosis not present

## 2018-04-16 DIAGNOSIS — Z452 Encounter for adjustment and management of vascular access device: Secondary | ICD-10-CM | POA: Diagnosis not present

## 2018-04-16 DIAGNOSIS — C787 Secondary malignant neoplasm of liver and intrahepatic bile duct: Secondary | ICD-10-CM | POA: Diagnosis not present

## 2018-04-19 ENCOUNTER — Other Ambulatory Visit: Payer: Self-pay | Admitting: Cardiovascular Disease

## 2018-04-19 NOTE — Telephone Encounter (Signed)
Please review for refill.  

## 2018-04-22 DIAGNOSIS — S72002D Fracture of unspecified part of neck of left femur, subsequent encounter for closed fracture with routine healing: Secondary | ICD-10-CM | POA: Diagnosis not present

## 2018-05-01 ENCOUNTER — Encounter: Payer: Self-pay | Admitting: Family Medicine

## 2018-05-01 ENCOUNTER — Ambulatory Visit (INDEPENDENT_AMBULATORY_CARE_PROVIDER_SITE_OTHER): Payer: Medicare HMO | Admitting: Family Medicine

## 2018-05-01 VITALS — BP 119/53 | HR 60 | Temp 96.8°F | Ht 71.0 in | Wt 159.8 lb

## 2018-05-01 DIAGNOSIS — I10 Essential (primary) hypertension: Secondary | ICD-10-CM | POA: Diagnosis not present

## 2018-05-01 DIAGNOSIS — E785 Hyperlipidemia, unspecified: Secondary | ICD-10-CM

## 2018-05-01 DIAGNOSIS — Z720 Tobacco use: Secondary | ICD-10-CM | POA: Diagnosis not present

## 2018-05-01 DIAGNOSIS — D509 Iron deficiency anemia, unspecified: Secondary | ICD-10-CM | POA: Diagnosis not present

## 2018-05-01 DIAGNOSIS — S72002A Fracture of unspecified part of neck of left femur, initial encounter for closed fracture: Secondary | ICD-10-CM

## 2018-05-01 NOTE — Progress Notes (Signed)
BP (!) 119/53   Pulse 60   Temp (!) 96.8 F (36 C) (Oral)   Ht _0  (1.803 m)   Wt 159 lb 12.8 oz (72.5 kg)   BMI 22.29 kg/m    Subjective:    [name], male    DOB: Oct 21, 1945, 73 y.o.   MRN: [id number]  HPI: AVREY HYSER is a 73 y.o. male presenting on 05/01/2018 for Hypertension (6 month follow up)   HPI Follow-up hip fracture Patient is coming in today for follow-up on his hip fracture.  He says that he had no known inciting event but was lifting the leg up in his arm chair and experienced left pain was significant and was found to have a left hip fracture on 03/15/2018 in the emergency department.  He has been seen since 1118 until stability.  Hypertension Patient is currently on lisinopril, and their blood pressure today is 119/53. Patient denies any lightheadedness or dizziness. Patient denies headaches, blurred vision, chest pains, shortness of breath, or weakness. Denies any side effects from medication and is content with current medication.   Hyperlipidemia Patient is coming in for recheck of his hyperlipidemia. The patient is currently taking Zetia and atorvastatin. They deny any issues with myalgias or history of liver damage from it. They deny any focal numbness or weakness or chest pain.   Patient also has anemia that is been monitored chemo and cancer treatment, will recheck anemia today.  Relevant past medical, surgical, family and social history reviewed and updated as indicated. Interim medical history since our last visit reviewed. Allergies and medications reviewed and updated.  Review of Systems  Constitutional: Negative for chills and fever.  Eyes: Positive for discharge. Negative for visual disturbance.  Respiratory: Negative for shortness of breath and wheezing.   Cardiovascular: Negative for chest pain and leg swelling.  Gastrointestinal: Negative for abdominal pain.  Musculoskeletal: Positive for arthralgias. Negative for back pain,  gait problem and joint swelling.  Skin: Negative for rash.  Neurological: Negative for dizziness, weakness and numbness.  All other systems reviewed and are negative.   Per HPI unless specifically indicated above   Allergies as of 05/01/2018      Reactions   Contrast Media [iodinated Diagnostic Agents] Itching, Rash      Medication List       Accurate as of May 01, 2018  1:56 PM. Always use your most recent med list.        acetaminophen 325 MG tablet Commonly known as:  TYLENOL Take 650 mg by mouth daily as needed for moderate pain or headache.   aspirin 81 MG EC tablet Take 81 mg by mouth daily.   atorvastatin 40 MG tablet Commonly known as:  LIPITOR Take 1 tablet (40 mg total) by mouth daily.   clopidogrel 75 MG tablet Commonly known as:  PLAVIX Take 1 tablet (75 mg total) by mouth daily.   diphenoxylate-atropine 2.5-0.025 MG tablet Commonly known as:  LOMOTIL TAKE 1 TO 2 TABLETS 4 TIMES DAILY AS NEEDED FOR DIARRHEA   enoxaparin 40 MG/0.4ML injection Commonly known as:  LOVENOX Inject 0.4 mLs (40 mg total) into the skin daily for 30 doses. For 30 days post op for DVT prophylaxis   ezetimibe 10 MG tablet Commonly known as:  ZETIA Take 10 mg by mouth daily.   HYDROcodone-acetaminophen 5-325 MG tablet Commonly known as:  NORCO Take 1 tablet by mouth every 6 (six) hours as needed for severe pain.  lidocaine-prilocaine cream Commonly known as:  EMLA APPLY A QUARTER SIZE AMOUNT TO PORT SITE 1 HOUR PRIOR TO CHEMO. DO NOT RUB IN. COVER WITH PLASTIC WRAP.   lisinopril 5 MG tablet Commonly known as:  PRINIVIL,ZESTRIL Take 0.5 tablets (2.5 mg total) by mouth daily.   ondansetron 8 MG tablet Commonly known as:  ZOFRAN Take 1 tablet (8 mg total) by mouth every 8 (eight) hours as needed for nausea or vomiting.   pantoprazole 40 MG tablet Commonly known as:  PROTONIX TAKE 1 TABLET BY MOUTH DAILY AT 6:00 A.M.   prochlorperazine 10 MG tablet Commonly known as:   COMPAZINE Take 1 tablet (10 mg total) by mouth every 6 (six) hours as needed for nausea or vomiting.          Objective:    BP (!) 119/53   Pulse 60   Temp (!) 96.8 F (36 C) (Oral)   Ht _0  (1.803 m)   Wt 159 lb 12.8 oz (72.5 kg)   BMI 22.29 kg/m   Wt Readings from Last 3 Encounters:  05/01/18 159 lb 12.8 oz (72.5 kg)  03/18/18 165 lb 5.5 oz (75 kg)  01/16/18 166 lb (75.3 kg)    Physical Exam Vitals signs and nursing note reviewed.  Constitutional:      General: He is not in acute distress.    Appearance: He is well-developed. He is not diaphoretic.  Eyes:     General: No scleral icterus.       Right eye: No discharge.     Conjunctiva/sclera: Conjunctivae normal.     Pupils: Pupils are equal, round, and reactive to light.  Neck:     Musculoskeletal: Neck supple.     Thyroid: No thyromegaly.  Cardiovascular:     Rate and Rhythm: Normal rate and regular rhythm.     Heart sounds: Normal heart sounds. No murmur.  Pulmonary:     Effort: Pulmonary effort is normal. No respiratory distress.     Breath sounds: Normal breath sounds. No wheezing.  Musculoskeletal: Normal range of motion.        General: Tenderness (Left hip anterior tenderness) present. No swelling.  Lymphadenopathy:     Cervical: No cervical adenopathy.  Skin:    General: Skin is warm and dry.     Findings: No rash.  Neurological:     Mental Status: He is alert and oriented to person, place, and time.     Coordination: Coordination normal.  Psychiatric:        Behavior: Behavior normal.         Assessment & Plan:   Problem List Items Addressed This Visit      Cardiovascular and Mediastinum   Hypertension - Primary   Relevant Orders   CMP14+EGFR     Other   Tobacco abuse (Chronic)   Relevant Orders   CBC with Differential/Platelet   Hyperlipidemia with target LDL less than 70   Relevant Orders   Lipid panel   Iron deficiency anemia   Relevant Orders   CBC with  Differential/Platelet    Other Visit Diagnoses    Closed displaced fracture of left femoral neck (HCC)          Hip fracture, is already seen and evaluated by orthopedic, already had surgery for it and did everything that he supposed to and is working with them still currently. Follow up plan: Return in about 6 months (around 11/01/2018), or if symptoms worsen or fail to improve, for Anemia  and hypertension cholesterol.  Counseling provided for all of the vaccine components Orders Placed This Encounter  Procedures  . CBC with Differential/Platelet  . CMP14+EGFR  . Lipid panel    Caryl Pina, MD [facility] 05/01/2018, 1:56 PM

## 2018-05-02 ENCOUNTER — Telehealth: Payer: Self-pay | Admitting: Family Medicine

## 2018-05-02 LAB — CBC WITH DIFFERENTIAL/PLATELET
Basophils Absolute: 0.1 10*3/uL (ref 0.0–0.2)
Basos: 0 %
EOS (ABSOLUTE): 0.4 10*3/uL (ref 0.0–0.4)
Eos: 3 %
HEMATOCRIT: 34.6 % — AB (ref 37.5–51.0)
Hemoglobin: 11 g/dL — ABNORMAL LOW (ref 13.0–17.7)
Immature Grans (Abs): 0.1 10*3/uL (ref 0.0–0.1)
Immature Granulocytes: 1 %
Lymphocytes Absolute: 1 10*3/uL (ref 0.7–3.1)
Lymphs: 7 %
MCH: 27.8 pg (ref 26.6–33.0)
MCHC: 31.8 g/dL (ref 31.5–35.7)
MCV: 88 fL (ref 79–97)
Monocytes Absolute: 0.9 10*3/uL (ref 0.1–0.9)
Monocytes: 7 %
Neutrophils Absolute: 10.7 10*3/uL — ABNORMAL HIGH (ref 1.4–7.0)
Neutrophils: 82 %
Platelets: 363 10*3/uL (ref 150–450)
RBC: 3.95 x10E6/uL — ABNORMAL LOW (ref 4.14–5.80)
RDW: 13.3 % (ref 11.6–15.4)
WBC: 13 10*3/uL — ABNORMAL HIGH (ref 3.4–10.8)

## 2018-05-02 LAB — CMP14+EGFR
ALT: 14 IU/L (ref 0–44)
AST: 10 IU/L (ref 0–40)
Albumin/Globulin Ratio: 1.1 — ABNORMAL LOW (ref 1.2–2.2)
Albumin: 3.4 g/dL — ABNORMAL LOW (ref 3.7–4.7)
Alkaline Phosphatase: 263 IU/L — ABNORMAL HIGH (ref 39–117)
BUN/Creatinine Ratio: 15 (ref 10–24)
BUN: 15 mg/dL (ref 8–27)
Bilirubin Total: 0.4 mg/dL (ref 0.0–1.2)
CO2: 25 mmol/L (ref 20–29)
CREATININE: 0.97 mg/dL (ref 0.76–1.27)
Calcium: 9.4 mg/dL (ref 8.6–10.2)
Chloride: 97 mmol/L (ref 96–106)
GFR calc Af Amer: 89 mL/min/{1.73_m2} (ref >59)
GFR calc non Af Amer: 77 mL/min/{1.73_m2} (ref >59)
Globulin, Total: 3.1 g/dL (ref 1.5–4.5)
Glucose: 119 mg/dL — ABNORMAL HIGH (ref 65–99)
Potassium: 4.7 mmol/L (ref 3.5–5.2)
SODIUM: 135 mmol/L (ref 134–144)
Total Protein: 6.5 g/dL (ref 6.0–8.5)

## 2018-05-02 LAB — LIPID PANEL
Chol/HDL Ratio: 3.8 ratio (ref 0.0–5.0)
Cholesterol, Total: 103 mg/dL (ref 100–199)
HDL: 27 mg/dL — ABNORMAL LOW (ref >39)
LDL Calculated: 61 mg/dL (ref 0–99)
Triglycerides: 76 mg/dL (ref 0–149)
VLDL Cholesterol Cal: 15 mg/dL (ref 5–40)

## 2018-05-02 NOTE — Telephone Encounter (Signed)
Aware of results. 

## 2018-05-20 DIAGNOSIS — S72002D Fracture of unspecified part of neck of left femur, subsequent encounter for closed fracture with routine healing: Secondary | ICD-10-CM | POA: Diagnosis not present

## 2018-05-24 ENCOUNTER — Telehealth: Payer: Self-pay

## 2018-05-24 NOTE — Telephone Encounter (Signed)
   Cardiac Questionnaire:    Since your last visit or hospitalization:    1. Have you been having new or worsening chest pain? No   2. Have you been having new or worsening shortness of breath? No 3. Have you been having new or worsening leg swelling, wt gain, or increase in abdominal girth (pants fitting more tightly)? No   4. Have you had any passing out spells? No                Primary Cardiologist:  Dr.Kelly   Patient contacted.  History reviewed.  No symptoms to suggest any unstable cardiac conditions.  Based on discussion, with current pandemic situation, we will be postponing this appointment for Steven Drake with a plan for f/u in 12 wks or sooner if feasible/necessary.  If symptoms change, he has been instructed to contact our office.   Routing to C19 CANCEL pool for tracking (P CV DIV CV19 CANCEL - reason for visit "other.") and assigning priority (1 = 4-6 wks, 2 = 6-12 wks, 3 = >12 wks).   Priority 3  Ena Dawley, LPN  07/05/3265 12:45 AM         .

## 2018-05-28 ENCOUNTER — Ambulatory Visit: Payer: Medicare HMO | Admitting: Cardiovascular Disease

## 2018-06-18 ENCOUNTER — Other Ambulatory Visit: Payer: Self-pay | Admitting: Cardiovascular Disease

## 2018-06-18 NOTE — Telephone Encounter (Deleted)
Zetia refilled. 

## 2018-06-27 ENCOUNTER — Other Ambulatory Visit: Payer: Self-pay

## 2018-06-27 ENCOUNTER — Ambulatory Visit (INDEPENDENT_AMBULATORY_CARE_PROVIDER_SITE_OTHER): Payer: Medicare Other

## 2018-06-27 DIAGNOSIS — S7292XA Unspecified fracture of left femur, initial encounter for closed fracture: Secondary | ICD-10-CM

## 2018-06-27 DIAGNOSIS — I10 Essential (primary) hypertension: Secondary | ICD-10-CM | POA: Diagnosis not present

## 2018-06-27 DIAGNOSIS — C189 Malignant neoplasm of colon, unspecified: Secondary | ICD-10-CM

## 2018-06-27 DIAGNOSIS — D509 Iron deficiency anemia, unspecified: Secondary | ICD-10-CM | POA: Diagnosis not present

## 2018-06-27 DIAGNOSIS — C169 Malignant neoplasm of stomach, unspecified: Secondary | ICD-10-CM

## 2018-06-27 DIAGNOSIS — R1310 Dysphagia, unspecified: Secondary | ICD-10-CM

## 2018-06-27 DIAGNOSIS — C19 Malignant neoplasm of rectosigmoid junction: Secondary | ICD-10-CM

## 2018-07-03 ENCOUNTER — Ambulatory Visit: Payer: Medicare HMO | Admitting: Gastroenterology

## 2018-07-08 ENCOUNTER — Telehealth: Payer: Self-pay

## 2018-07-08 NOTE — Telephone Encounter (Signed)
Hospice called reporting a fall on 5/7 with no injury.

## 2018-07-08 NOTE — Telephone Encounter (Signed)
Okay thanks for the information 

## 2018-07-12 ENCOUNTER — Encounter (HOSPITAL_COMMUNITY): Payer: Self-pay | Admitting: Hematology & Oncology

## 2018-07-29 DEATH — deceased

## 2018-08-26 ENCOUNTER — Ambulatory Visit: Payer: Medicare HMO | Admitting: Cardiovascular Disease

## 2018-11-06 ENCOUNTER — Ambulatory Visit: Payer: Medicare HMO | Admitting: Family Medicine

## 2019-08-12 IMAGING — US US BIOPSY LYMPH NODE
1 series · 13 of 23 positions shown · non-contrast
Comparison: none

INDICATION: Stage III gastric antral adenocarcinoma and history of colon cancer

[Series 1: us biopsy lymph node · 13 of 23 slices shown]
[im 1/23]
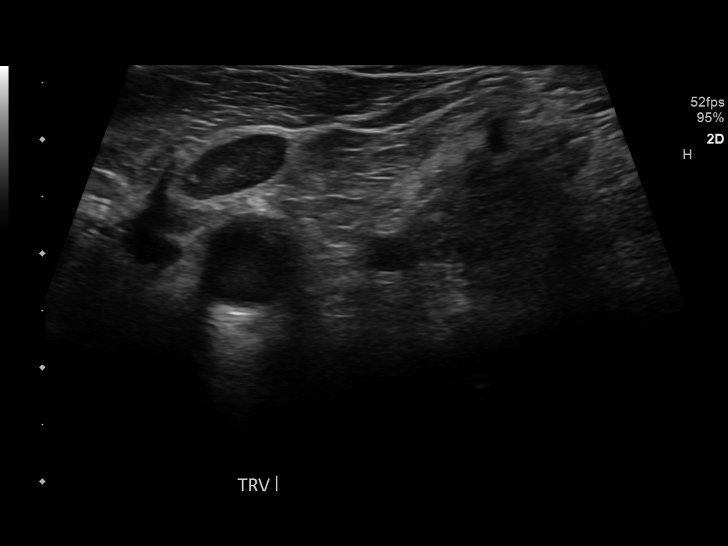
[im 3/23]
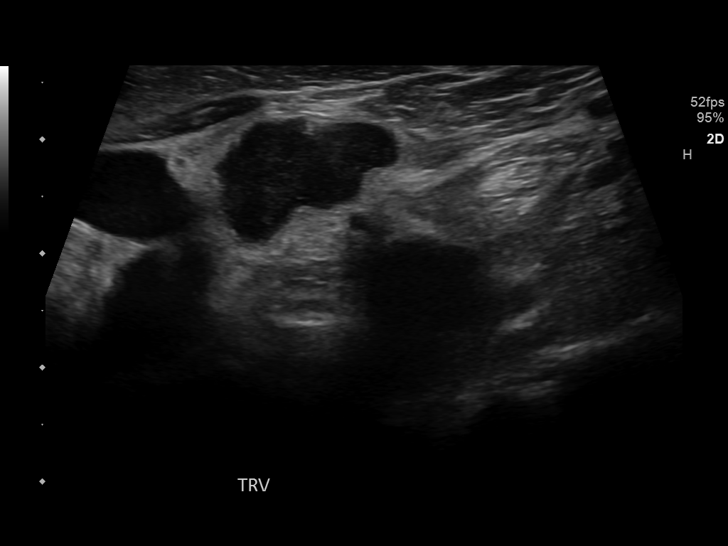
[im 5/23]
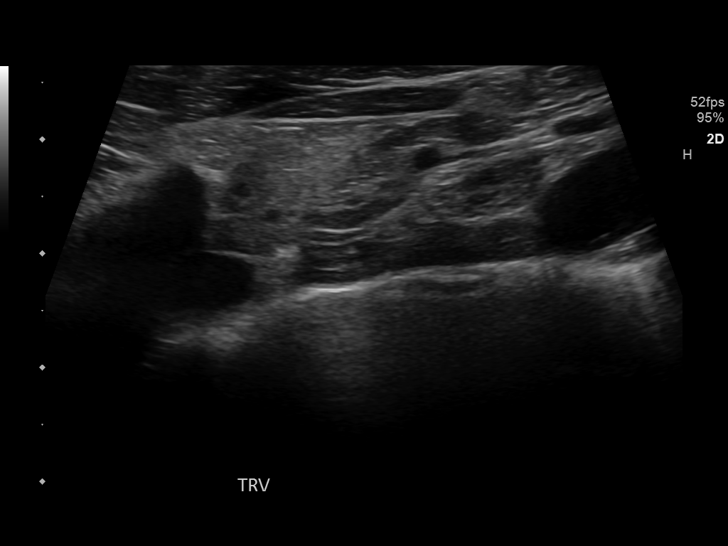
[im 7/23]
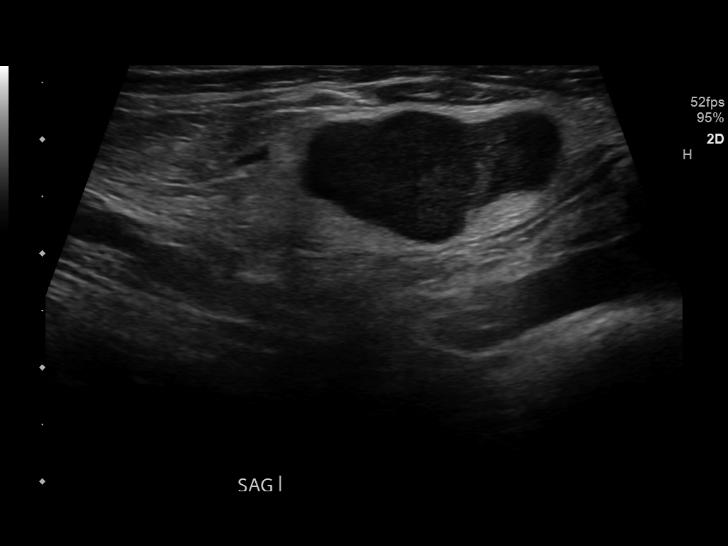
[im 8/23]
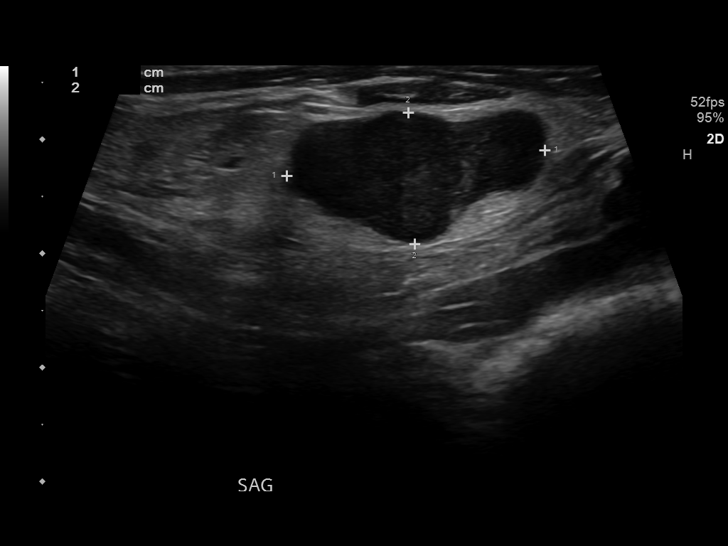
[im 10/23]
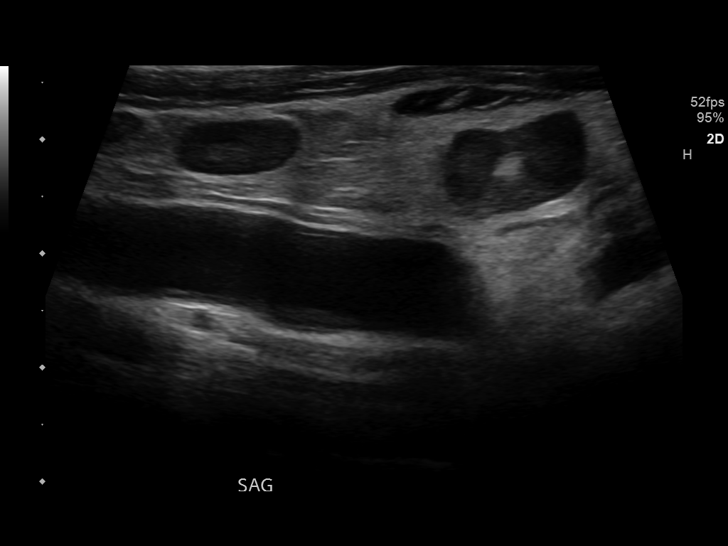
[im 12/23]
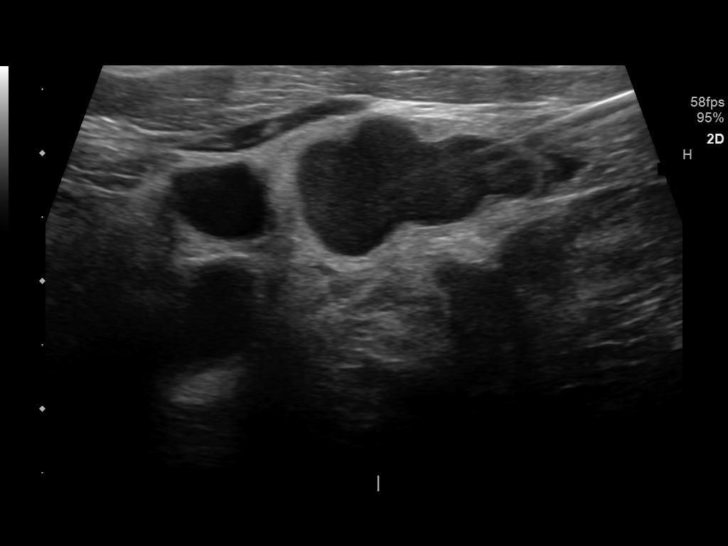
[im 14/23]
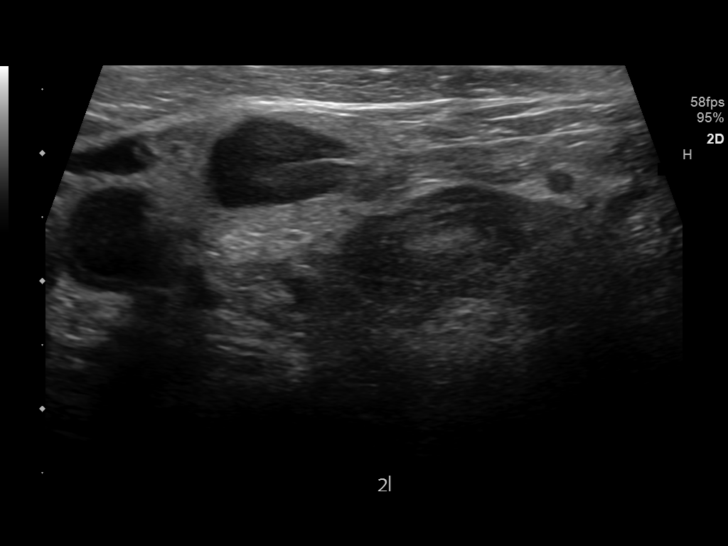
[im 16/23]
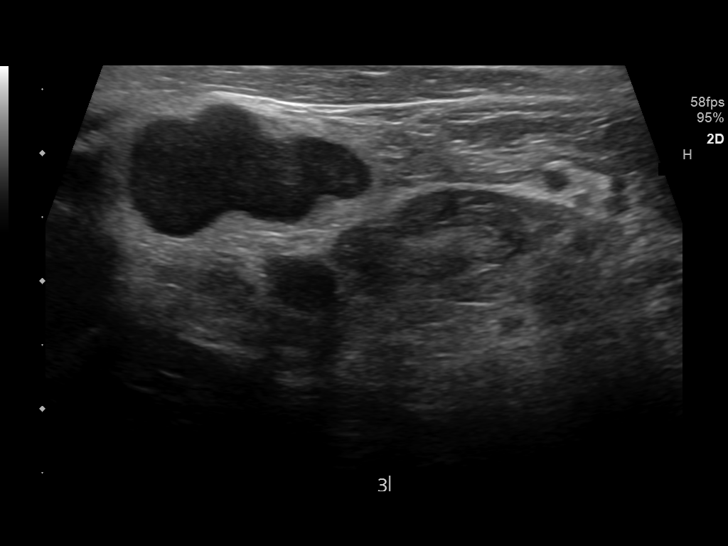
[im 17/23]
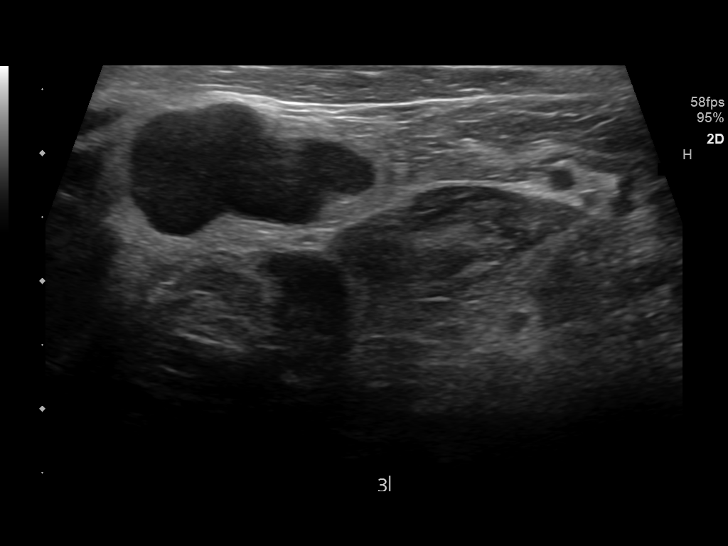
[im 19/23]
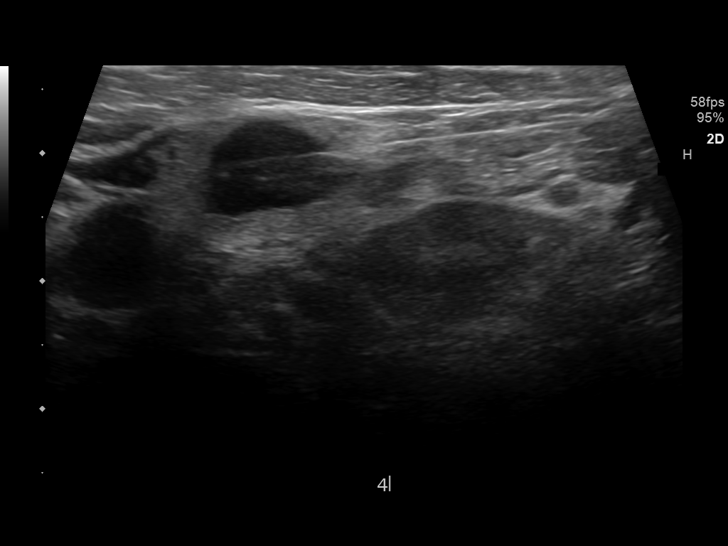
[im 21/23]
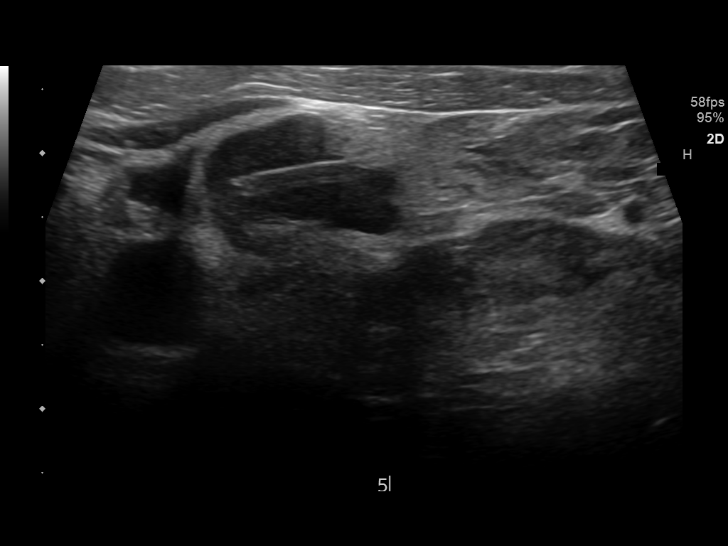
[im 23/23]
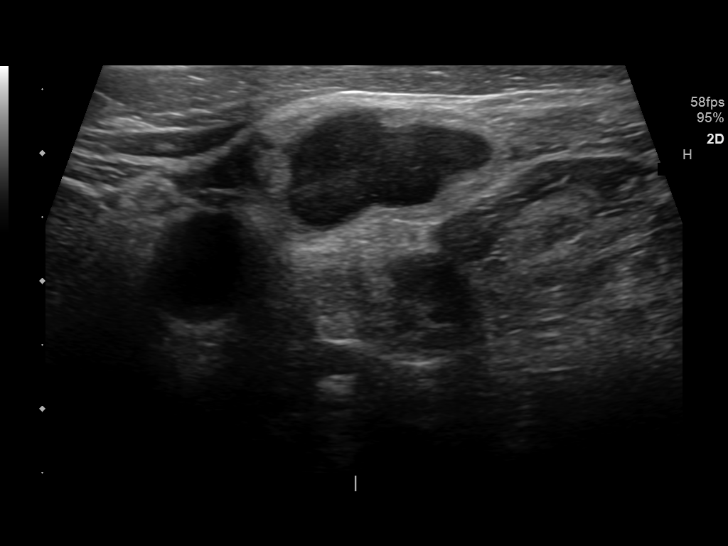

[13 of 23 positions shown; findings below may reference images not displayed]

EXAM:
ULTRASOUND LEFT SUPRACLAVICULAR ADENOPATHY CORE BIOPSY

MEDICATIONS:
1% LIDOCAINE LOCAL

ANESTHESIA/SEDATION:
Moderate (conscious) sedation was employed during this procedure. A
total of Versed 2.0 mg and Fentanyl 100 mcg was administered
intravenously.

Moderate Sedation Time: 10 minutes. The patient's level of
consciousness and vital signs were monitored continuously by
radiology nursing throughout the procedure under my direct
supervision.

FLUOROSCOPY TIME:  Fluoroscopy Time: NONE.

COMPLICATIONS:
None immediate.

PROCEDURE:
Informed written consent was obtained from the patient after a
thorough discussion of the procedural risks, benefits and
alternatives. All questions were addressed. Maximal Sterile Barrier
Technique was utilized including caps, mask, sterile gowns, sterile
gloves, sterile drape, hand hygiene and skin antiseptic. A timeout
was performed prior to the initiation of the procedure.

Previous imaging reviewed. Preliminary ultrasound performed. The
left supraclavicular adenopathy was localized and correlated with
the PET-CT. Overlying skin marked.

Under sterile conditions and local anesthesia, an 18 gauge core
biopsy was advanced to the left supraclavicular adenopathy under
direct ultrasound. Images obtained for documentation. Lymph node
measures 2.3 x 1.1 cm. Several 18 gauge core biopsies obtained and
placed in saline. Biopsies were intact and non fragmented. Needle
removed. Postprocedure imaging demonstrates no or hematoma. Patient
tolerated biopsy
IMPRESSION: Successful ultrasound left supraclavicular adenopathy 18 gauge core
biopsy

## 2019-11-04 IMAGING — DX DG HIP (WITH OR WITHOUT PELVIS) 2-3V*L*
3 series · 3 of 3 positions shown · non-contrast
Comparison: None.

CLINICAL DATA: Pain in LEFT hip which began yesterday. No fall.
Popping sound. History of adenocarcinoma.

EXAM:
DG HIP (WITH OR WITHOUT PELVIS) 2-3V LEFT

[pelvis ap]
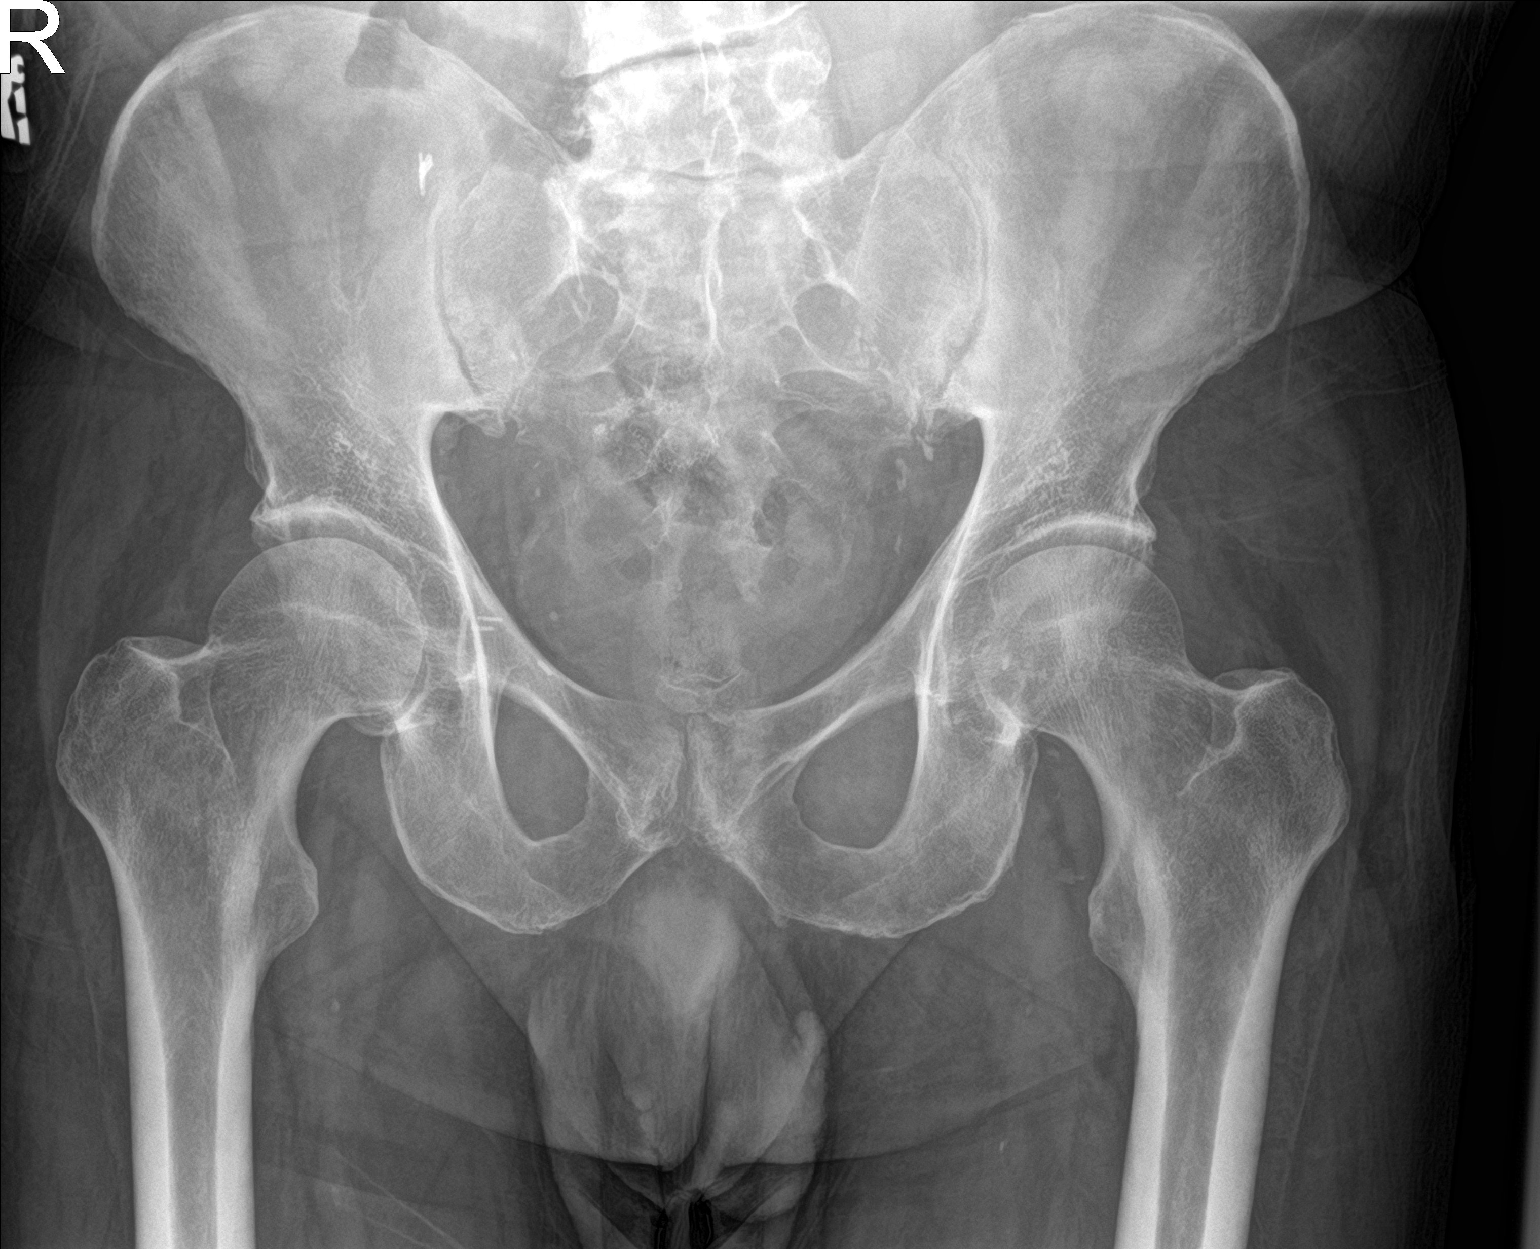

[hip ap]
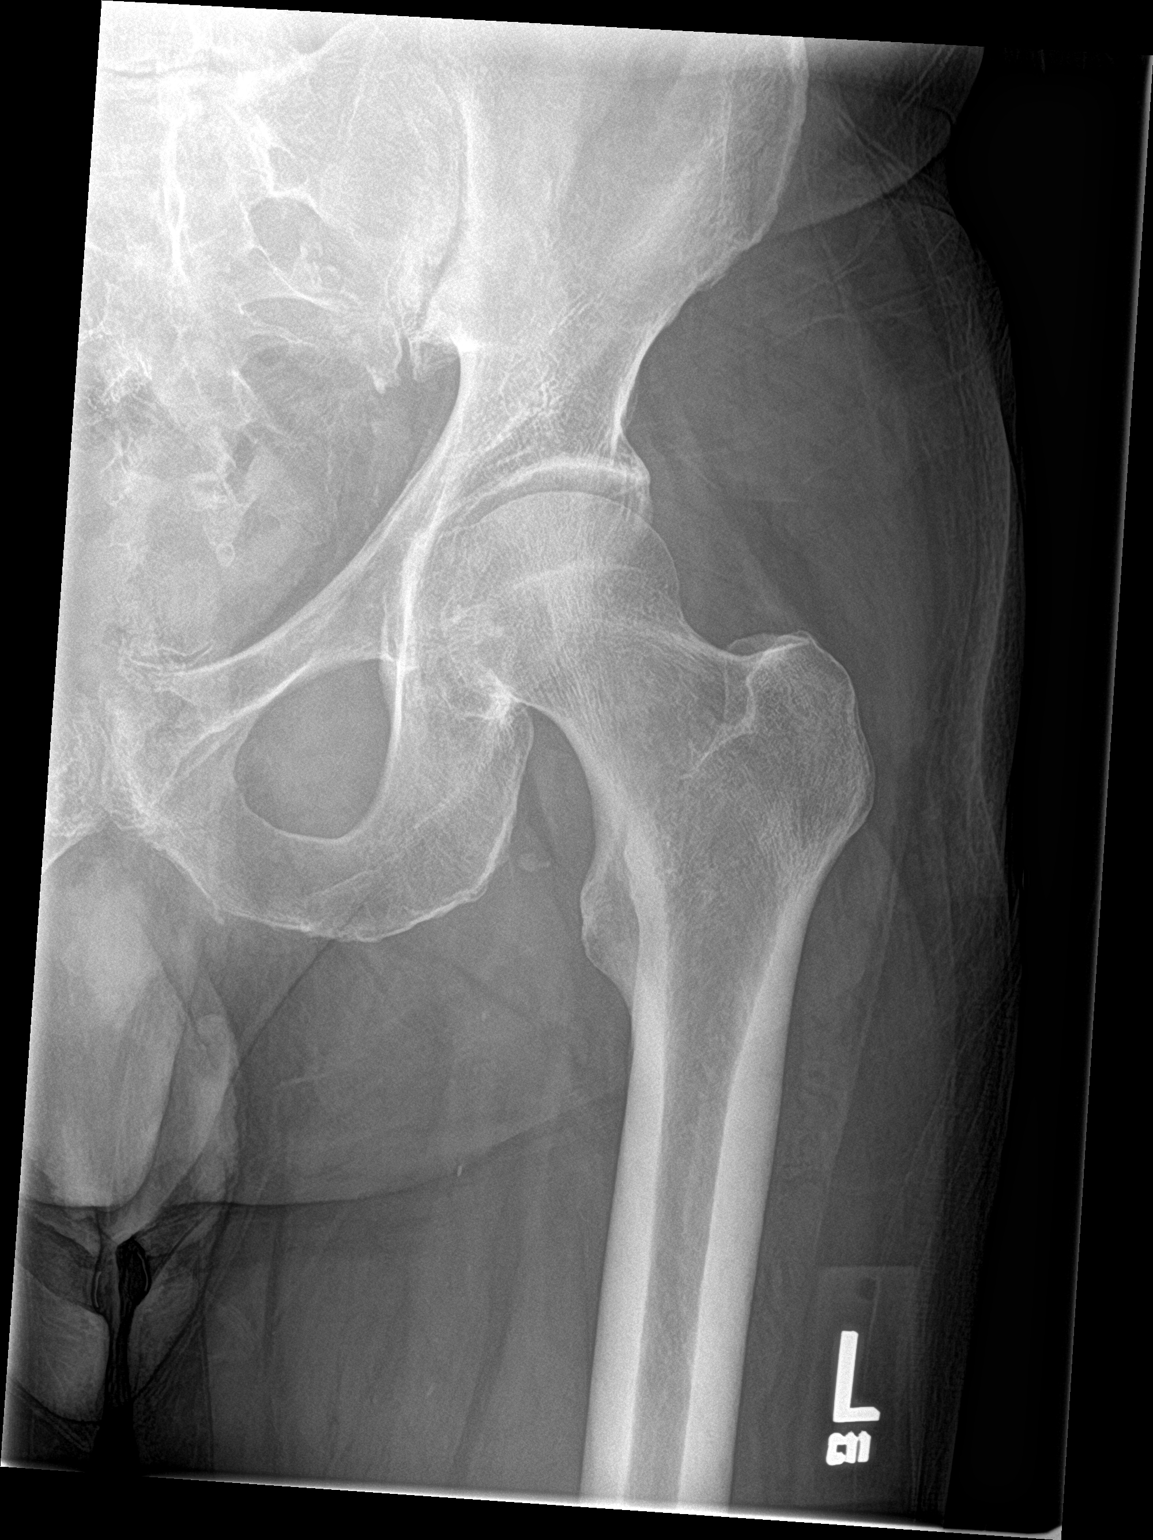

[hip lat]
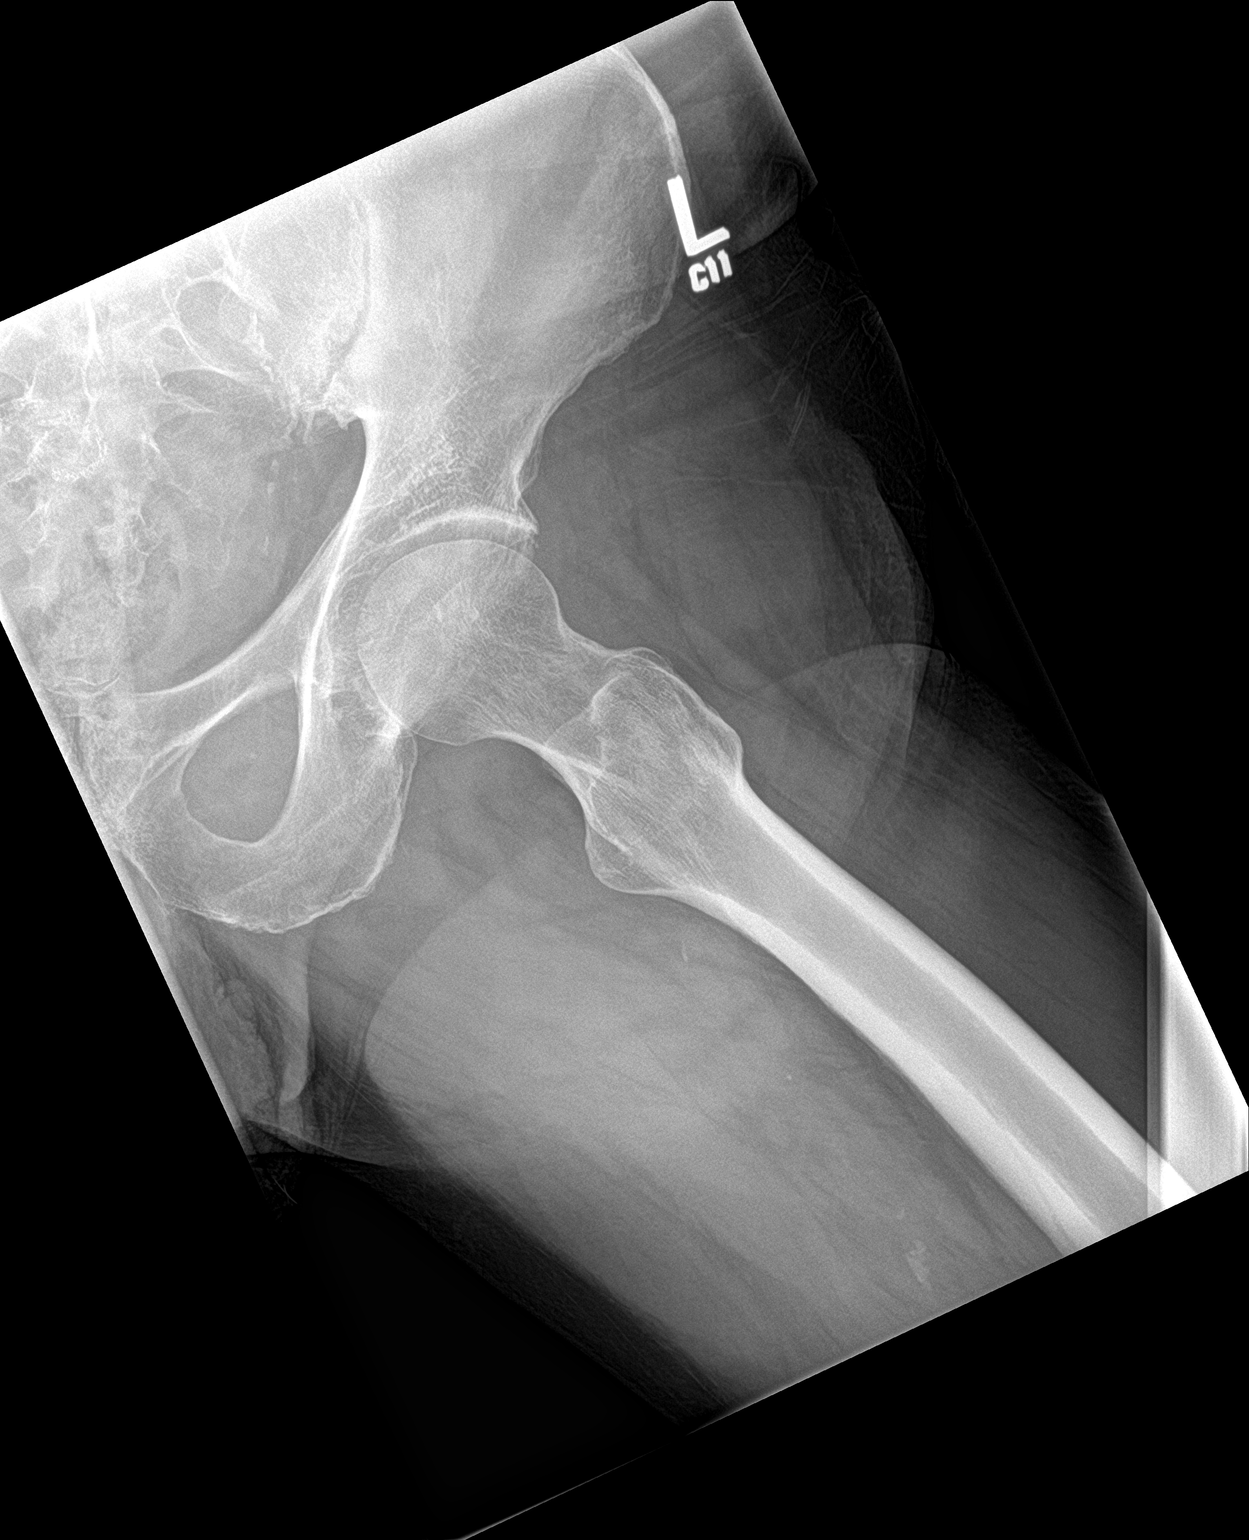

[3 of 3 positions shown; findings below may reference images not displayed]

FINDINGS: There is no evidence of hip fracture or dislocation. There are
subtle lucencies through the LEFT pubic parasymphyseal region, as
well as the LEFT inferior pubic ramus. Correlate clinically for
acute insufficiency fractures. No definite bone destruction to
suggest pathologic fracture. Advanced degenerative change at L4-5
and L5-S1
IMPRESSION: Subtle lucencies through the LEFT pubis parasymphyseal region as
well as LEFT inferior pubic ramus could represent insufficiency
fractures. Correlate clinically.

No hip fracture or dislocation.
